# Patient Record
Sex: Female | Born: 1935 | Race: Black or African American | Hispanic: No | Smoking: Former smoker
Health system: Southern US, Community
[De-identification: ages and names within clinical notes are randomized; demographics above are authoritative.]

## PROBLEM LIST (undated history)

## (undated) DIAGNOSIS — D509 Iron deficiency anemia, unspecified: Secondary | ICD-10-CM

## (undated) DIAGNOSIS — E119 Type 2 diabetes mellitus without complications: Secondary | ICD-10-CM

## (undated) DIAGNOSIS — M8580 Other specified disorders of bone density and structure, unspecified site: Secondary | ICD-10-CM

## (undated) DIAGNOSIS — M1710 Unilateral primary osteoarthritis, unspecified knee: Secondary | ICD-10-CM

## (undated) DIAGNOSIS — K219 Gastro-esophageal reflux disease without esophagitis: Secondary | ICD-10-CM

## (undated) DIAGNOSIS — R590 Localized enlarged lymph nodes: Secondary | ICD-10-CM

## (undated) DIAGNOSIS — N189 Chronic kidney disease, unspecified: Secondary | ICD-10-CM

## (undated) DIAGNOSIS — Z860100 Personal history of colon polyps, unspecified: Secondary | ICD-10-CM

## (undated) DIAGNOSIS — I10 Essential (primary) hypertension: Secondary | ICD-10-CM

## (undated) DIAGNOSIS — Z87442 Personal history of urinary calculi: Secondary | ICD-10-CM

## (undated) DIAGNOSIS — I739 Peripheral vascular disease, unspecified: Secondary | ICD-10-CM

## (undated) HISTORY — DX: Personal history of colon polyps, unspecified: Z86.0100

## (undated) HISTORY — PX: WISDOM TOOTH EXTRACTION: SHX21

## (undated) HISTORY — DX: Type 2 diabetes mellitus without complications: E11.9

## (undated) HISTORY — PX: DENTAL SURGERY: SHX609

## (undated) HISTORY — DX: Other specified disorders of bone density and structure, unspecified site: M85.80

## (undated) HISTORY — DX: Unilateral primary osteoarthritis, unspecified knee: M17.10

## (undated) HISTORY — PX: TOTAL ABDOMINAL HYSTERECTOMY: SHX209

## (undated) HISTORY — DX: Chronic kidney disease, unspecified: N18.9

## (undated) HISTORY — DX: Iron deficiency anemia, unspecified: D50.9

## (undated) HISTORY — DX: Personal history of colonic polyps: Z86.010

## (undated) HISTORY — PX: OTHER SURGICAL HISTORY: SHX169

## (undated) HISTORY — DX: Peripheral vascular disease, unspecified: I73.9

## (undated) HISTORY — DX: Localized enlarged lymph nodes: R59.0

## (undated) HISTORY — DX: Essential (primary) hypertension: I10

---

## 2001-01-31 ENCOUNTER — Encounter: Payer: Self-pay | Admitting: Family Medicine

## 2001-01-31 ENCOUNTER — Ambulatory Visit (HOSPITAL_COMMUNITY): Admission: RE | Admit: 2001-01-31 | Discharge: 2001-01-31 | Payer: Self-pay | Admitting: Family Medicine

## 2001-05-17 ENCOUNTER — Encounter: Payer: Self-pay | Admitting: Internal Medicine

## 2001-05-17 ENCOUNTER — Ambulatory Visit (HOSPITAL_COMMUNITY): Admission: RE | Admit: 2001-05-17 | Discharge: 2001-05-17 | Payer: Self-pay | Admitting: Internal Medicine

## 2002-02-12 ENCOUNTER — Ambulatory Visit (HOSPITAL_COMMUNITY): Admission: RE | Admit: 2002-02-12 | Discharge: 2002-02-12 | Payer: Self-pay | Admitting: Family Medicine

## 2002-02-12 ENCOUNTER — Encounter: Payer: Self-pay | Admitting: Family Medicine

## 2002-10-19 ENCOUNTER — Encounter: Payer: Self-pay | Admitting: Podiatry

## 2002-10-22 ENCOUNTER — Ambulatory Visit (HOSPITAL_COMMUNITY): Admission: RE | Admit: 2002-10-22 | Discharge: 2002-10-22 | Payer: Self-pay | Admitting: Podiatry

## 2003-02-13 ENCOUNTER — Ambulatory Visit (HOSPITAL_COMMUNITY): Admission: RE | Admit: 2003-02-13 | Discharge: 2003-02-13 | Payer: Self-pay | Admitting: Family Medicine

## 2003-02-13 ENCOUNTER — Encounter: Payer: Self-pay | Admitting: Family Medicine

## 2003-07-01 ENCOUNTER — Ambulatory Visit (HOSPITAL_COMMUNITY): Admission: RE | Admit: 2003-07-01 | Discharge: 2003-07-01 | Payer: Self-pay | Admitting: Podiatry

## 2004-02-18 ENCOUNTER — Ambulatory Visit (HOSPITAL_COMMUNITY): Admission: RE | Admit: 2004-02-18 | Discharge: 2004-02-18 | Payer: Self-pay | Admitting: Family Medicine

## 2004-11-04 ENCOUNTER — Ambulatory Visit (HOSPITAL_COMMUNITY): Admission: RE | Admit: 2004-11-04 | Discharge: 2004-11-04 | Payer: Self-pay | Admitting: Family Medicine

## 2004-11-12 ENCOUNTER — Ambulatory Visit: Payer: Self-pay | Admitting: Orthopedic Surgery

## 2004-12-18 ENCOUNTER — Ambulatory Visit: Payer: Self-pay | Admitting: Gastroenterology

## 2005-06-16 ENCOUNTER — Ambulatory Visit: Payer: Self-pay | Admitting: Orthopedic Surgery

## 2005-06-22 ENCOUNTER — Ambulatory Visit (HOSPITAL_COMMUNITY): Admission: RE | Admit: 2005-06-22 | Discharge: 2005-06-22 | Payer: Self-pay | Admitting: Family Medicine

## 2005-06-29 ENCOUNTER — Ambulatory Visit (HOSPITAL_COMMUNITY): Admission: RE | Admit: 2005-06-29 | Discharge: 2005-06-29 | Payer: Self-pay | Admitting: Family Medicine

## 2005-07-27 ENCOUNTER — Ambulatory Visit (HOSPITAL_COMMUNITY): Admission: RE | Admit: 2005-07-27 | Discharge: 2005-07-27 | Payer: Self-pay | Admitting: Internal Medicine

## 2005-07-29 ENCOUNTER — Ambulatory Visit (HOSPITAL_COMMUNITY): Admission: RE | Admit: 2005-07-29 | Discharge: 2005-07-29 | Payer: Self-pay | Admitting: Internal Medicine

## 2006-06-23 ENCOUNTER — Ambulatory Visit (HOSPITAL_COMMUNITY): Admission: RE | Admit: 2006-06-23 | Discharge: 2006-06-23 | Payer: Self-pay | Admitting: Family Medicine

## 2006-07-04 ENCOUNTER — Ambulatory Visit: Payer: Self-pay | Admitting: Family Medicine

## 2006-08-15 ENCOUNTER — Ambulatory Visit: Payer: Self-pay | Admitting: Family Medicine

## 2006-09-15 ENCOUNTER — Ambulatory Visit: Payer: Self-pay | Admitting: Family Medicine

## 2006-10-14 ENCOUNTER — Encounter: Payer: Self-pay | Admitting: Family Medicine

## 2006-10-14 DIAGNOSIS — M899 Disorder of bone, unspecified: Secondary | ICD-10-CM

## 2006-10-14 DIAGNOSIS — E669 Obesity, unspecified: Secondary | ICD-10-CM

## 2006-10-14 DIAGNOSIS — I10 Essential (primary) hypertension: Secondary | ICD-10-CM

## 2006-10-14 DIAGNOSIS — Z860100 Personal history of colon polyps, unspecified: Secondary | ICD-10-CM | POA: Insufficient documentation

## 2006-10-14 DIAGNOSIS — M949 Disorder of cartilage, unspecified: Secondary | ICD-10-CM

## 2006-10-14 DIAGNOSIS — M1990 Unspecified osteoarthritis, unspecified site: Secondary | ICD-10-CM

## 2006-11-17 ENCOUNTER — Ambulatory Visit: Payer: Self-pay | Admitting: Family Medicine

## 2006-11-18 ENCOUNTER — Encounter (INDEPENDENT_AMBULATORY_CARE_PROVIDER_SITE_OTHER): Payer: Self-pay | Admitting: Family Medicine

## 2006-11-18 LAB — CONVERTED CEMR LAB
BUN: 30 mg/dL — ABNORMAL HIGH (ref 6–23)
CO2: 21 meq/L (ref 19–32)
Calcium: 9 mg/dL (ref 8.4–10.5)
Chloride: 107 meq/L (ref 96–112)
Creatinine, Ser: 1.53 mg/dL — ABNORMAL HIGH (ref 0.40–1.20)
Glucose, Bld: 101 mg/dL — ABNORMAL HIGH (ref 70–99)
Potassium: 4.2 meq/L (ref 3.5–5.3)
Sodium: 144 meq/L (ref 135–145)

## 2006-12-19 ENCOUNTER — Ambulatory Visit: Payer: Self-pay | Admitting: Gastroenterology

## 2006-12-19 ENCOUNTER — Ambulatory Visit (HOSPITAL_COMMUNITY): Admission: RE | Admit: 2006-12-19 | Discharge: 2006-12-19 | Payer: Self-pay | Admitting: Gastroenterology

## 2006-12-20 LAB — HM COLONOSCOPY

## 2006-12-22 ENCOUNTER — Ambulatory Visit: Payer: Self-pay | Admitting: Family Medicine

## 2006-12-23 ENCOUNTER — Encounter (INDEPENDENT_AMBULATORY_CARE_PROVIDER_SITE_OTHER): Payer: Self-pay | Admitting: Family Medicine

## 2007-01-02 ENCOUNTER — Ambulatory Visit (HOSPITAL_COMMUNITY): Admission: RE | Admit: 2007-01-02 | Discharge: 2007-01-02 | Payer: Self-pay | Admitting: Family Medicine

## 2007-01-02 ENCOUNTER — Encounter (INDEPENDENT_AMBULATORY_CARE_PROVIDER_SITE_OTHER): Payer: Self-pay | Admitting: Family Medicine

## 2007-01-24 ENCOUNTER — Ambulatory Visit: Payer: Self-pay | Admitting: Family Medicine

## 2007-04-25 ENCOUNTER — Ambulatory Visit: Payer: Self-pay | Admitting: Family Medicine

## 2007-04-25 LAB — CONVERTED CEMR LAB
Cholesterol, target level: 200 mg/dL
HDL goal, serum: 40 mg/dL
LDL Goal: 130 mg/dL

## 2007-04-26 ENCOUNTER — Encounter (INDEPENDENT_AMBULATORY_CARE_PROVIDER_SITE_OTHER): Payer: Self-pay | Admitting: Family Medicine

## 2007-04-27 ENCOUNTER — Encounter (INDEPENDENT_AMBULATORY_CARE_PROVIDER_SITE_OTHER): Payer: Self-pay | Admitting: Family Medicine

## 2007-04-28 ENCOUNTER — Telehealth (INDEPENDENT_AMBULATORY_CARE_PROVIDER_SITE_OTHER): Payer: Self-pay | Admitting: *Deleted

## 2007-04-28 LAB — CONVERTED CEMR LAB
Albumin: 3.8 g/dL (ref 3.5–5.2)
CO2: 28 meq/L (ref 19–32)
Calcium: 9.3 mg/dL (ref 8.4–10.5)
Cholesterol: 180 mg/dL (ref 0–200)
Sodium: 143 meq/L (ref 135–145)
Total CHOL/HDL Ratio: 2.6
Total Protein: 6.9 g/dL (ref 6.0–8.3)
Triglycerides: 57 mg/dL (ref <150)

## 2007-05-23 ENCOUNTER — Ambulatory Visit: Payer: Self-pay | Admitting: Family Medicine

## 2007-06-22 ENCOUNTER — Ambulatory Visit: Payer: Self-pay | Admitting: Family Medicine

## 2007-06-26 ENCOUNTER — Ambulatory Visit (HOSPITAL_COMMUNITY): Admission: RE | Admit: 2007-06-26 | Discharge: 2007-06-26 | Payer: Self-pay | Admitting: Family Medicine

## 2007-06-28 ENCOUNTER — Encounter (INDEPENDENT_AMBULATORY_CARE_PROVIDER_SITE_OTHER): Payer: Self-pay | Admitting: Family Medicine

## 2007-07-06 ENCOUNTER — Ambulatory Visit: Payer: Self-pay | Admitting: Orthopedic Surgery

## 2007-07-06 ENCOUNTER — Encounter (INDEPENDENT_AMBULATORY_CARE_PROVIDER_SITE_OTHER): Payer: Self-pay | Admitting: Family Medicine

## 2007-07-06 DIAGNOSIS — M1710 Unilateral primary osteoarthritis, unspecified knee: Secondary | ICD-10-CM

## 2007-07-20 ENCOUNTER — Ambulatory Visit: Payer: Self-pay | Admitting: Orthopedic Surgery

## 2007-08-07 ENCOUNTER — Ambulatory Visit: Payer: Self-pay | Admitting: Family Medicine

## 2007-08-15 ENCOUNTER — Encounter: Payer: Self-pay | Admitting: Orthopedic Surgery

## 2007-08-15 ENCOUNTER — Ambulatory Visit: Payer: Self-pay | Admitting: Orthopedic Surgery

## 2007-08-15 ENCOUNTER — Inpatient Hospital Stay (HOSPITAL_COMMUNITY): Admission: RE | Admit: 2007-08-15 | Discharge: 2007-08-18 | Payer: Self-pay | Admitting: Orthopedic Surgery

## 2007-08-18 ENCOUNTER — Encounter (INDEPENDENT_AMBULATORY_CARE_PROVIDER_SITE_OTHER): Payer: Self-pay | Admitting: Family Medicine

## 2007-08-18 ENCOUNTER — Encounter: Payer: Self-pay | Admitting: Orthopedic Surgery

## 2007-08-22 ENCOUNTER — Encounter (INDEPENDENT_AMBULATORY_CARE_PROVIDER_SITE_OTHER): Payer: Self-pay | Admitting: *Deleted

## 2007-08-22 LAB — CONVERTED CEMR LAB
BUN: 19 mg/dL
CO2: 27 meq/L
Calcium: 8.3 mg/dL
Chloride: 110 meq/L
INR: 1.8
Potassium: 4.1 meq/L
Prothrombin Time: 21.4 s

## 2007-08-29 ENCOUNTER — Ambulatory Visit: Payer: Self-pay | Admitting: Orthopedic Surgery

## 2007-09-06 ENCOUNTER — Ambulatory Visit: Payer: Self-pay | Admitting: Orthopedic Surgery

## 2007-09-23 ENCOUNTER — Emergency Department (HOSPITAL_COMMUNITY): Admission: EM | Admit: 2007-09-23 | Discharge: 2007-09-23 | Payer: Self-pay | Admitting: Emergency Medicine

## 2007-09-25 ENCOUNTER — Telehealth: Payer: Self-pay | Admitting: Orthopedic Surgery

## 2007-09-27 ENCOUNTER — Ambulatory Visit: Payer: Self-pay | Admitting: Orthopedic Surgery

## 2007-10-17 ENCOUNTER — Telehealth: Payer: Self-pay | Admitting: Orthopedic Surgery

## 2007-10-26 ENCOUNTER — Encounter (INDEPENDENT_AMBULATORY_CARE_PROVIDER_SITE_OTHER): Payer: Self-pay | Admitting: Family Medicine

## 2007-11-02 ENCOUNTER — Encounter (INDEPENDENT_AMBULATORY_CARE_PROVIDER_SITE_OTHER): Payer: Self-pay | Admitting: Family Medicine

## 2007-11-06 ENCOUNTER — Ambulatory Visit: Payer: Self-pay | Admitting: Family Medicine

## 2007-11-07 ENCOUNTER — Telehealth (INDEPENDENT_AMBULATORY_CARE_PROVIDER_SITE_OTHER): Payer: Self-pay | Admitting: *Deleted

## 2007-11-07 LAB — CONVERTED CEMR LAB
BUN: 15 mg/dL (ref 6–23)
CO2: 23 meq/L (ref 19–32)
Creatinine, Ser: 1.07 mg/dL (ref 0.40–1.20)
Hemoglobin: 12.1 g/dL (ref 12.0–15.0)
Lymphocytes Relative: 39 % (ref 12–46)
Lymphs Abs: 1.8 10*3/uL (ref 0.7–4.0)
MCHC: 31.7 g/dL (ref 30.0–36.0)
MCV: 93.9 fL (ref 78.0–100.0)
Neutrophils Relative %: 49 % (ref 43–77)
Platelets: 254 10*3/uL (ref 150–400)
Potassium: 4.1 meq/L (ref 3.5–5.3)
RDW: 14.7 % (ref 11.5–15.5)
WBC: 4.5 10*3/uL (ref 4.0–10.5)

## 2007-11-09 ENCOUNTER — Ambulatory Visit: Payer: Self-pay | Admitting: Orthopedic Surgery

## 2007-11-17 ENCOUNTER — Ambulatory Visit: Payer: Self-pay | Admitting: Family Medicine

## 2007-11-30 ENCOUNTER — Encounter (INDEPENDENT_AMBULATORY_CARE_PROVIDER_SITE_OTHER): Payer: Self-pay | Admitting: Family Medicine

## 2007-12-04 ENCOUNTER — Ambulatory Visit: Payer: Self-pay | Admitting: Family Medicine

## 2007-12-04 DIAGNOSIS — E119 Type 2 diabetes mellitus without complications: Secondary | ICD-10-CM | POA: Insufficient documentation

## 2007-12-07 ENCOUNTER — Encounter (INDEPENDENT_AMBULATORY_CARE_PROVIDER_SITE_OTHER): Payer: Self-pay | Admitting: Family Medicine

## 2007-12-08 ENCOUNTER — Encounter (INDEPENDENT_AMBULATORY_CARE_PROVIDER_SITE_OTHER): Payer: Self-pay | Admitting: Family Medicine

## 2007-12-08 ENCOUNTER — Telehealth (INDEPENDENT_AMBULATORY_CARE_PROVIDER_SITE_OTHER): Payer: Self-pay | Admitting: *Deleted

## 2007-12-12 ENCOUNTER — Encounter (INDEPENDENT_AMBULATORY_CARE_PROVIDER_SITE_OTHER): Payer: Self-pay | Admitting: Family Medicine

## 2007-12-22 ENCOUNTER — Ambulatory Visit: Payer: Self-pay | Admitting: Family Medicine

## 2007-12-22 LAB — CONVERTED CEMR LAB
Blood Glucose, Fasting: 129 mg/dL
Hgb A1c MFr Bld: 6.7 %
LDL Goal: 100 mg/dL

## 2007-12-26 ENCOUNTER — Ambulatory Visit: Payer: Self-pay | Admitting: Orthopedic Surgery

## 2007-12-28 ENCOUNTER — Encounter: Payer: Self-pay | Admitting: Orthopedic Surgery

## 2007-12-28 ENCOUNTER — Encounter (HOSPITAL_COMMUNITY): Admission: RE | Admit: 2007-12-28 | Discharge: 2008-01-27 | Payer: Self-pay | Admitting: Orthopedic Surgery

## 2008-01-25 ENCOUNTER — Encounter (INDEPENDENT_AMBULATORY_CARE_PROVIDER_SITE_OTHER): Payer: Self-pay | Admitting: Family Medicine

## 2008-01-25 ENCOUNTER — Telehealth (INDEPENDENT_AMBULATORY_CARE_PROVIDER_SITE_OTHER): Payer: Self-pay | Admitting: *Deleted

## 2008-01-25 LAB — CONVERTED CEMR LAB
AST: 28 units/L (ref 0–37)
Albumin: 4 g/dL (ref 3.5–5.2)
Alkaline Phosphatase: 109 units/L (ref 39–117)
Calcium: 9.4 mg/dL (ref 8.4–10.5)
Cholesterol: 165 mg/dL (ref 0–200)
Creatinine, Ser: 1.24 mg/dL — ABNORMAL HIGH (ref 0.40–1.20)
HDL: 63 mg/dL (ref >39)
Microalb, Ur: 0.52 mg/dL (ref 0.00–1.89)
Potassium: 4.2 meq/L (ref 3.5–5.3)
Sodium: 143 meq/L (ref 135–145)
TSH: 1.035 microintl units/mL (ref 0.350–5.50)
Total Bilirubin: 0.6 mg/dL (ref 0.3–1.2)
Total CHOL/HDL Ratio: 2.6
Total Protein: 7.1 g/dL (ref 6.0–8.3)

## 2008-01-29 ENCOUNTER — Encounter (HOSPITAL_COMMUNITY): Admission: RE | Admit: 2008-01-29 | Discharge: 2008-02-28 | Payer: Self-pay | Admitting: Orthopedic Surgery

## 2008-02-01 ENCOUNTER — Encounter: Payer: Self-pay | Admitting: Orthopedic Surgery

## 2008-02-05 ENCOUNTER — Ambulatory Visit: Payer: Self-pay | Admitting: Family Medicine

## 2008-02-07 ENCOUNTER — Encounter: Payer: Self-pay | Admitting: Orthopedic Surgery

## 2008-02-08 ENCOUNTER — Emergency Department (HOSPITAL_COMMUNITY): Admission: EM | Admit: 2008-02-08 | Discharge: 2008-02-08 | Payer: Self-pay | Admitting: Emergency Medicine

## 2008-02-09 ENCOUNTER — Emergency Department (HOSPITAL_COMMUNITY): Admission: EM | Admit: 2008-02-09 | Discharge: 2008-02-10 | Payer: Self-pay | Admitting: Emergency Medicine

## 2008-02-09 ENCOUNTER — Telehealth (INDEPENDENT_AMBULATORY_CARE_PROVIDER_SITE_OTHER): Payer: Self-pay | Admitting: Family Medicine

## 2008-02-09 ENCOUNTER — Telehealth: Payer: Self-pay | Admitting: Orthopedic Surgery

## 2008-02-13 ENCOUNTER — Ambulatory Visit: Payer: Self-pay | Admitting: Family Medicine

## 2008-02-22 ENCOUNTER — Encounter (INDEPENDENT_AMBULATORY_CARE_PROVIDER_SITE_OTHER): Payer: Self-pay | Admitting: Family Medicine

## 2008-02-29 ENCOUNTER — Ambulatory Visit: Payer: Self-pay | Admitting: Orthopedic Surgery

## 2008-02-29 DIAGNOSIS — M24569 Contracture, unspecified knee: Secondary | ICD-10-CM

## 2008-03-08 ENCOUNTER — Ambulatory Visit: Payer: Self-pay | Admitting: Orthopedic Surgery

## 2008-03-08 ENCOUNTER — Ambulatory Visit (HOSPITAL_COMMUNITY): Admission: RE | Admit: 2008-03-08 | Discharge: 2008-03-08 | Payer: Self-pay | Admitting: Orthopedic Surgery

## 2008-03-08 HISTORY — PX: OTHER SURGICAL HISTORY: SHX169

## 2008-03-12 ENCOUNTER — Ambulatory Visit: Payer: Self-pay | Admitting: Orthopedic Surgery

## 2008-03-21 ENCOUNTER — Ambulatory Visit: Payer: Self-pay | Admitting: Orthopedic Surgery

## 2008-03-21 ENCOUNTER — Telehealth: Payer: Self-pay | Admitting: Orthopedic Surgery

## 2008-03-26 ENCOUNTER — Ambulatory Visit: Payer: Self-pay | Admitting: Orthopedic Surgery

## 2008-03-29 ENCOUNTER — Encounter: Payer: Self-pay | Admitting: Orthopedic Surgery

## 2008-03-29 ENCOUNTER — Telehealth: Payer: Self-pay | Admitting: Orthopedic Surgery

## 2008-04-01 ENCOUNTER — Ambulatory Visit: Payer: Self-pay | Admitting: Orthopedic Surgery

## 2008-04-08 ENCOUNTER — Ambulatory Visit: Payer: Self-pay | Admitting: Family Medicine

## 2008-04-08 LAB — CONVERTED CEMR LAB
Glucose, Bld: 115 mg/dL
Hgb A1c MFr Bld: 6.6 %

## 2008-04-16 ENCOUNTER — Ambulatory Visit: Payer: Self-pay | Admitting: Orthopedic Surgery

## 2008-05-09 ENCOUNTER — Ambulatory Visit: Payer: Self-pay | Admitting: Orthopedic Surgery

## 2008-05-29 ENCOUNTER — Telehealth: Payer: Self-pay | Admitting: Orthopedic Surgery

## 2008-06-06 ENCOUNTER — Encounter: Payer: Self-pay | Admitting: Orthopedic Surgery

## 2008-06-13 ENCOUNTER — Ambulatory Visit: Payer: Self-pay | Admitting: Orthopedic Surgery

## 2008-06-26 ENCOUNTER — Ambulatory Visit (HOSPITAL_COMMUNITY): Admission: RE | Admit: 2008-06-26 | Discharge: 2008-06-26 | Payer: Self-pay | Admitting: Family Medicine

## 2008-07-08 ENCOUNTER — Ambulatory Visit: Payer: Self-pay | Admitting: Family Medicine

## 2008-07-09 ENCOUNTER — Encounter (INDEPENDENT_AMBULATORY_CARE_PROVIDER_SITE_OTHER): Payer: Self-pay | Admitting: Family Medicine

## 2008-07-10 ENCOUNTER — Encounter (INDEPENDENT_AMBULATORY_CARE_PROVIDER_SITE_OTHER): Payer: Self-pay | Admitting: Family Medicine

## 2008-08-14 HISTORY — PX: OTHER SURGICAL HISTORY: SHX169

## 2008-08-15 ENCOUNTER — Ambulatory Visit: Payer: Self-pay | Admitting: Orthopedic Surgery

## 2008-08-22 ENCOUNTER — Ambulatory Visit: Payer: Self-pay | Admitting: Family Medicine

## 2008-08-22 LAB — HM DIABETES EYE EXAM

## 2008-08-22 LAB — CONVERTED CEMR LAB: Glucose, Bld: 180 mg/dL

## 2008-08-24 ENCOUNTER — Encounter (INDEPENDENT_AMBULATORY_CARE_PROVIDER_SITE_OTHER): Payer: Self-pay | Admitting: Family Medicine

## 2008-08-26 ENCOUNTER — Encounter (INDEPENDENT_AMBULATORY_CARE_PROVIDER_SITE_OTHER): Payer: Self-pay | Admitting: Family Medicine

## 2008-08-26 ENCOUNTER — Ambulatory Visit (HOSPITAL_COMMUNITY): Admission: RE | Admit: 2008-08-26 | Discharge: 2008-08-26 | Payer: Self-pay | Admitting: Family Medicine

## 2008-08-26 LAB — CONVERTED CEMR LAB
CO2: 24 meq/L (ref 19–32)
Chloride: 106 meq/L (ref 96–112)
Creatinine, Ser: 1.31 mg/dL — ABNORMAL HIGH (ref 0.40–1.20)
Potassium: 4.3 meq/L (ref 3.5–5.3)

## 2008-08-27 ENCOUNTER — Encounter (INDEPENDENT_AMBULATORY_CARE_PROVIDER_SITE_OTHER): Payer: Self-pay | Admitting: Family Medicine

## 2008-10-18 HISTORY — PX: OTHER SURGICAL HISTORY: SHX169

## 2008-12-12 ENCOUNTER — Ambulatory Visit: Payer: Self-pay | Admitting: Family Medicine

## 2009-02-12 ENCOUNTER — Ambulatory Visit: Payer: Self-pay | Admitting: Orthopedic Surgery

## 2009-02-12 ENCOUNTER — Ambulatory Visit: Payer: Self-pay | Admitting: Family Medicine

## 2009-02-12 DIAGNOSIS — Z96659 Presence of unspecified artificial knee joint: Secondary | ICD-10-CM | POA: Insufficient documentation

## 2009-02-13 ENCOUNTER — Encounter (INDEPENDENT_AMBULATORY_CARE_PROVIDER_SITE_OTHER): Payer: Self-pay | Admitting: Family Medicine

## 2009-02-18 LAB — CONVERTED CEMR LAB
ALT: 26 units/L (ref 0–35)
Albumin: 3.8 g/dL (ref 3.5–5.2)
Alkaline Phosphatase: 112 units/L (ref 39–117)
BUN: 25 mg/dL — ABNORMAL HIGH (ref 6–23)
Basophils Absolute: 0 10*3/uL (ref 0.0–0.1)
Basophils Relative: 1 % (ref 0–1)
CO2: 28 meq/L (ref 19–32)
Cholesterol: 156 mg/dL (ref 0–200)
Creatinine, Urine: 168.3 mg/dL
Eosinophils Absolute: 0.2 10*3/uL (ref 0.0–0.7)
Eosinophils Relative: 3 % (ref 0–5)
HCT: 41.5 % (ref 36.0–46.0)
Lymphocytes Relative: 37 % (ref 12–46)
MCHC: 32.3 g/dL (ref 30.0–36.0)
Microalb, Ur: 1.02 mg/dL (ref 0.00–1.89)
Monocytes Absolute: 0.5 10*3/uL (ref 0.1–1.0)
Neutro Abs: 2.6 10*3/uL (ref 1.7–7.7)
Potassium: 4.5 meq/L (ref 3.5–5.3)
RBC: 4.26 M/uL (ref 3.87–5.11)
RDW: 14.4 % (ref 11.5–15.5)
Sodium: 146 meq/L — ABNORMAL HIGH (ref 135–145)
Total Protein: 7 g/dL (ref 6.0–8.3)
Triglycerides: 59 mg/dL (ref <150)
WBC: 5.3 10*3/uL (ref 4.0–10.5)

## 2009-04-16 ENCOUNTER — Ambulatory Visit: Payer: Self-pay | Admitting: Family Medicine

## 2009-04-16 DIAGNOSIS — N182 Chronic kidney disease, stage 2 (mild): Secondary | ICD-10-CM | POA: Insufficient documentation

## 2009-04-16 LAB — CONVERTED CEMR LAB: Glucose, Bld: 149 mg/dL

## 2009-06-27 ENCOUNTER — Ambulatory Visit (HOSPITAL_COMMUNITY): Admission: RE | Admit: 2009-06-27 | Discharge: 2009-06-27 | Payer: Self-pay | Admitting: Family Medicine

## 2009-07-02 ENCOUNTER — Ambulatory Visit: Payer: Self-pay | Admitting: Family Medicine

## 2009-07-02 LAB — CONVERTED CEMR LAB: Hgb A1c MFr Bld: 6.8 %

## 2009-07-14 ENCOUNTER — Encounter (INDEPENDENT_AMBULATORY_CARE_PROVIDER_SITE_OTHER): Payer: Self-pay | Admitting: Family Medicine

## 2009-08-25 ENCOUNTER — Ambulatory Visit: Payer: Self-pay | Admitting: Orthopedic Surgery

## 2009-09-03 ENCOUNTER — Ambulatory Visit: Payer: Self-pay | Admitting: Family Medicine

## 2009-09-19 ENCOUNTER — Encounter (INDEPENDENT_AMBULATORY_CARE_PROVIDER_SITE_OTHER): Payer: Self-pay | Admitting: *Deleted

## 2009-09-19 ENCOUNTER — Ambulatory Visit: Payer: Self-pay | Admitting: Cardiology

## 2009-09-19 DIAGNOSIS — R9431 Abnormal electrocardiogram [ECG] [EKG]: Secondary | ICD-10-CM

## 2009-09-19 DIAGNOSIS — R011 Cardiac murmur, unspecified: Secondary | ICD-10-CM | POA: Insufficient documentation

## 2009-09-23 ENCOUNTER — Encounter: Payer: Self-pay | Admitting: Cardiology

## 2009-09-23 ENCOUNTER — Ambulatory Visit (HOSPITAL_COMMUNITY): Admission: RE | Admit: 2009-09-23 | Discharge: 2009-09-23 | Payer: Self-pay | Admitting: Cardiology

## 2009-09-23 ENCOUNTER — Ambulatory Visit: Payer: Self-pay | Admitting: Cardiology

## 2009-09-29 ENCOUNTER — Encounter: Payer: Self-pay | Admitting: Family Medicine

## 2009-10-20 ENCOUNTER — Encounter: Payer: Self-pay | Admitting: Family Medicine

## 2009-10-23 ENCOUNTER — Ambulatory Visit: Payer: Self-pay | Admitting: Family Medicine

## 2009-11-04 ENCOUNTER — Encounter: Payer: Self-pay | Admitting: Family Medicine

## 2009-11-06 ENCOUNTER — Ambulatory Visit: Payer: Self-pay | Admitting: Family Medicine

## 2010-02-23 LAB — CONVERTED CEMR LAB
Basophils Absolute: 0 10*3/uL (ref 0.0–0.1)
Calcium: 9.3 mg/dL (ref 8.4–10.5)
Cholesterol: 156 mg/dL (ref 0–200)
Creatinine, Ser: 1.38 mg/dL — ABNORMAL HIGH (ref 0.40–1.20)
Creatinine, Urine: 172.6 mg/dL
Eosinophils Relative: 2 % (ref 0–5)
Glucose, Bld: 89 mg/dL (ref 70–99)
HDL: 64 mg/dL (ref >39)
Hemoglobin: 12.5 g/dL (ref 12.0–15.0)
LDL Cholesterol: 75 mg/dL (ref 0–99)
Lymphocytes Relative: 42 % (ref 12–46)
MCHC: 32 g/dL (ref 30.0–36.0)
MCV: 96.5 fL (ref 78.0–100.0)
Microalb Creat Ratio: 3.5 mg/g (ref 0.0–30.0)
Monocytes Absolute: 0.4 10*3/uL (ref 0.1–1.0)
Monocytes Relative: 9 % (ref 3–12)
Neutro Abs: 1.8 10*3/uL (ref 1.7–7.7)
Neutrophils Relative %: 46 % (ref 43–77)
Potassium: 4.6 meq/L (ref 3.5–5.3)
RBC: 4.05 M/uL (ref 3.87–5.11)
VLDL: 17 mg/dL (ref 0–40)
WBC: 3.9 10*3/uL — ABNORMAL LOW (ref 4.0–10.5)

## 2010-03-03 ENCOUNTER — Ambulatory Visit: Payer: Self-pay | Admitting: Family Medicine

## 2010-03-03 ENCOUNTER — Other Ambulatory Visit: Admission: RE | Admit: 2010-03-03 | Discharge: 2010-03-03 | Payer: Self-pay | Admitting: Family Medicine

## 2010-03-03 DIAGNOSIS — R5381 Other malaise: Secondary | ICD-10-CM | POA: Insufficient documentation

## 2010-03-03 DIAGNOSIS — R5383 Other fatigue: Secondary | ICD-10-CM

## 2010-03-03 DIAGNOSIS — I739 Peripheral vascular disease, unspecified: Secondary | ICD-10-CM | POA: Insufficient documentation

## 2010-03-04 LAB — CONVERTED CEMR LAB: Hgb A1c MFr Bld: 6.5 % — ABNORMAL HIGH (ref <5.7)

## 2010-03-06 ENCOUNTER — Ambulatory Visit (HOSPITAL_COMMUNITY): Admission: RE | Admit: 2010-03-06 | Discharge: 2010-03-06 | Payer: Self-pay | Admitting: Family Medicine

## 2010-04-14 ENCOUNTER — Ambulatory Visit: Payer: Self-pay | Admitting: Family Medicine

## 2010-04-22 ENCOUNTER — Ambulatory Visit: Payer: Self-pay | Admitting: Cardiovascular Disease

## 2010-04-22 ENCOUNTER — Encounter: Payer: Self-pay | Admitting: Adult Health

## 2010-05-19 ENCOUNTER — Ambulatory Visit: Payer: Self-pay | Admitting: Cardiovascular Disease

## 2010-06-15 ENCOUNTER — Ambulatory Visit: Payer: Self-pay | Admitting: Cardiovascular Disease

## 2010-06-15 ENCOUNTER — Ambulatory Visit: Payer: Self-pay

## 2010-06-30 ENCOUNTER — Ambulatory Visit (HOSPITAL_COMMUNITY): Admission: RE | Admit: 2010-06-30 | Discharge: 2010-06-30 | Payer: Self-pay | Admitting: Family Medicine

## 2010-07-01 LAB — CONVERTED CEMR LAB
BUN: 28 mg/dL — ABNORMAL HIGH (ref 6–23)
Chloride: 107 meq/L (ref 96–112)
Glucose, Bld: 94 mg/dL (ref 70–99)
Hgb A1c MFr Bld: 6.1 % — ABNORMAL HIGH (ref <5.7)
Potassium: 4.3 meq/L (ref 3.5–5.3)
Sodium: 142 meq/L (ref 135–145)

## 2010-07-02 ENCOUNTER — Ambulatory Visit: Payer: Self-pay | Admitting: Family Medicine

## 2010-09-25 LAB — CONVERTED CEMR LAB
CO2: 28 meq/L (ref 19–32)
Chloride: 105 meq/L (ref 96–112)
Glucose, Bld: 176 mg/dL — ABNORMAL HIGH (ref 70–99)
Sodium: 142 meq/L (ref 135–145)

## 2010-10-01 ENCOUNTER — Ambulatory Visit: Payer: Self-pay | Admitting: Family Medicine

## 2010-11-10 ENCOUNTER — Ambulatory Visit
Admission: RE | Admit: 2010-11-10 | Discharge: 2010-11-10 | Payer: Self-pay | Source: Home / Self Care | Attending: Family Medicine | Admitting: Family Medicine

## 2010-11-10 DIAGNOSIS — H6120 Impacted cerumen, unspecified ear: Secondary | ICD-10-CM | POA: Insufficient documentation

## 2010-11-15 LAB — CONVERTED CEMR LAB: Glucose, Bld: 123 mg/dL

## 2010-11-17 NOTE — Assessment & Plan Note (Signed)
Summary: FUP   Vital Signs:  Patient profile:   77 year old female Menstrual status:  hysterectomy Height:      65 inches Weight:      185 pounds BMI:     30.90 O2 Sat:      97 % Pulse rate:   59 / minute Pulse rhythm:   regular Resp:     16 per minute BP sitting:   110 / 64  (left arm) Cuff size:   large  Vitals Entered By: Everitt Amber LPN (April 14, 2010 10:37 AM)  Nutrition Counseling: Patient's BMI is greater than 25 and therefore counseled on weight management options. CC: Follow up chronic problems   Primary Care Provider:  Syliva Overman MD  CC:  Follow up chronic problems.  History of Present Illness: Reports  thatshe has been  doing well. Denies recent fever or chills. Denies sinus pressure, nasal congestion , ear pain or sore throat. Denies chest congestion, or cough productive of sputum. Denies chest pain, palpitations, PND, orthopnea or leg swelling. Denies abdominal pain, nausea, vomitting, diarrhea or constipation. Denies change in bowel movements or bloody stool. Denies dysuria , frequency, incontinence or hesitancy. chronic   joint pain, and reduced mobility. Denies headaches, vertigo, seizures. Denies depression, anxiety or insomnia. Denies  rash, lesions, or itch.     Current Medications (verified): 1)  Caltrate 600+d Plus 600-400 Mg-Unit  Tabs (Calcium Carbonate-Vit D-Min) .... Two Times A Day 2)  Aspirin 81 Mg Tbec (Aspirin) .... Once Daily 3)  Lisinopril-Hydrochlorothiazide 20-12.5 Mg Tabs (Lisinopril-Hydrochlorothiazide) .... Take 1 Tablet By Mouth Once A Day 4)  Vitamin C Cr 500 Mg Cr-Caps (Ascorbic Acid) .... One Daily 5)  Flaxseed  Misc (Flaxseed) .... Two Tablespoons Daily 6)  Metformin Hcl 500 Mg Tabs (Metformin Hcl) .... Take 1 Tablet By Mouth Once A Day 7)  Accu-Chek Aviva  Strp (Glucose Blood) .... Once Daily Testing 8)  Accu-Chek Multiclix Lancets  Misc (Lancets) .... Once Daily Testing  Allergies (verified): No Known Drug  Allergies  Review of Systems      See HPI General:  Complains of fatigue. Eyes:  Denies discharge, double vision, eye pain, and red eye. MS:  Complains of low back pain, muscle aches, and stiffness; ambulates with a caner, no recent falls. Endo:  Denies cold intolerance, excessive hunger, excessive thirst, excessive urination, heat intolerance, polyuria, and weight change. Heme:  Denies abnormal bruising and bleeding. Allergy:  Complains of seasonal allergies; denies hives or rash and itching eyes.  Physical Exam  General:  Well-developed,well-nourished,in no acute distress; alert,appropriate and cooperative throughout examination HEENT: No facial asymmetry,  EOMI, No sinus tenderness, TM's Clear, oropharynx  pink and moist.   Chest: Clear to auscultation bilaterally.  CVS: S1, S2, No murmurs, No S3.   Abd: Soft, Nontender.  MS: decreased  ROM spine, hips, shoulders and knees.  Ext: No edema.   CNS: CN 2-12 intact, power tone and sensation normal throughout.   Skin: Intact, no visible lesions or rashes.  Psych: Good eye contact, normal affect.  Memory intact, not anxious or depressed appearing.    Impression & Recommendations:  Problem # 1:  PERIPHERAL VASCULAR DISEASE (ICD-443.9) Assessment Comment Only  Orders: Cardiology Referral (Cardiology), recent AbI testing revealed arterial disease in lower ext , curbside consult with cRDIOLOGY DETERMINED THE NEED FOR FURTHER IMAging studies  Problem # 2:  HYPERTENSION (ICD-401.9) Assessment: Improved  Her updated medication list for this problem includes:    Lisinopril-hydrochlorothiazide 20-12.5 Mg  Tabs (Lisinopril-hydrochlorothiazide) .Marland Kitchen... Take 1 tablet by mouth once a day  BP today: 110/64 Prior BP: 90/70 (03/03/2010)  Prior 10 Yr Risk Heart Disease: 6 % (07/02/2009)  Labs Reviewed: K+: 4.6 (02/20/2010) Creat: : 1.38 (02/20/2010)   Chol: 156 (02/20/2010)   HDL: 64 (02/20/2010)   LDL: 75 (02/20/2010)   TG: 83  (02/20/2010)  Problem # 3:  OSTEOARTHRITIS (ICD-715.90) Assessment: Unchanged  Her updated medication list for this problem includes:    Aspirin 81 Mg Tbec (Aspirin) ..... Once daily  Problem # 4:  DIABETES MELLITUS, TYPE II, CONTROLLED (ICD-250.00) Assessment: Unchanged  Her updated medication list for this problem includes:    Aspirin 81 Mg Tbec (Aspirin) ..... Once daily    Lisinopril-hydrochlorothiazide 20-12.5 Mg Tabs (Lisinopril-hydrochlorothiazide) .Marland Kitchen... Take 1 tablet by mouth once a day    Metformin Hcl 500 Mg Tabs (Metformin hcl) .Marland Kitchen... Take 1 tablet by mouth once a day  Orders: T- Hemoglobin A1C ([id number])  Labs Reviewed: Creat: 1.38 (02/20/2010)     Last Eye Exam: All well and advised recheck one year (08/22/2008) Reviewed HgBA1c results: 6.5 (03/03/2010)  6.5 (10/30/2009)  Complete Medication List: 1)  Caltrate 600+d Plus 600-400 Mg-unit Tabs (Calcium carbonate-vit d-min) .... Two times a day 2)  Aspirin 81 Mg Tbec (Aspirin) .... Once daily 3)  Lisinopril-hydrochlorothiazide 20-12.5 Mg Tabs (Lisinopril-hydrochlorothiazide) .... Take 1 tablet by mouth once a day 4)  Vitamin C Cr 500 Mg Cr-caps (Ascorbic acid) .... One daily 5)  Flaxseed Misc (Flaxseed) .... Two tablespoons daily 6)  Metformin Hcl 500 Mg Tabs (Metformin hcl) .... Take 1 tablet by mouth once a day 7)  Accu-chek Aviva Strp (Glucose blood) .... Once daily testing 8)  Accu-chek Multiclix Lancets Misc (Lancets) .... Once daily testing  Other Orders: T-Basic Metabolic Panel [phone])  Patient Instructions: 1)  Please schedule a follow-up appointment in 4 months. 2)  It is important that you exercise regularly at least 20 minutes 5 times a week. If you develop chest pain, have severe difficulty breathing, or feel very tired , stop exercising immediately and seek medical attention. 3)  You need to lose weight. Consider a lower calorie diet and regular exercise.  4)  BMP prior to visit, ICD-9: 5)   HbgA1C prior to visit, ICD-9:  non fasting in mid august, rember to drink at least 48 to 56 ounces water daily 6)  I will contact the card about your leg study, in the meantime keep walking and take 1 baby asprin daily, this will improve your circulation 7)  BP is great take med as before

## 2010-11-17 NOTE — Assessment & Plan Note (Signed)
Summary: per dr lab   Vital Signs:  Patient profile:   77 year old female Menstrual status:  hysterectomy Height:      65 inches Weight:      184.25 pounds BMI:     30.77 O2 Sat:      97 % Pulse rate:   55 / minute Pulse rhythm:   regular Resp:     16 per minute BP sitting:   110 / 60  (left arm) Cuff size:   large  Vitals Entered By: Everitt Amber LPN (July 02, 2010 10:40 AM)  Nutrition Counseling: Patient's BMI is greater than 25 and therefore counseled on weight management options. CC: Follow up chronic problems   Primary Care Provider:  Syliva Overman MD  CC:  Follow up chronic problems.  History of Present Illness: Reports  thatshe has been doing very well. She contiues to follow ahealthy diet, adntests her blood sugars regularly and gets excellent results. Denies recent fever or chills. Denies sinus pressure, nasal congestion , ear pain or sore throat. Denies chest congestion, or cough productive of sputum. Denies chest pain, palpitations, PND, orthopnea or leg swelling. Denies abdominal pain, nausea, vomitting, diarrhea or constipation. Denies change in bowel movements or bloody stool. Denies dysuria , frequency, incontinence or hesitancy. c/o increased left knee pain and reduced mobility, which she feels she got when she was having her vascular study done.She plans to call her ortho doc fo re-evaluation. Denies headaches, vertigo, seizures. Denies depression, anxiety or insomnia. Denies  rash, lesions, or itch.     Current Medications (verified): 1)  Caltrate 600+d Plus 600-400 Mg-Unit  Tabs (Calcium Carbonate-Vit D-Min) .... Two Times A Day 2)  Aspirin 81 Mg Tbec (Aspirin) .... Once Daily 3)  Lisinopril-Hydrochlorothiazide 20-12.5 Mg Tabs (Lisinopril-Hydrochlorothiazide) .... Take 1 Tablet By Mouth Once A Day 4)  Vitamin C Cr 500 Mg Cr-Caps (Ascorbic Acid) .... One Daily 5)  Flaxseed  Misc (Flaxseed) .... Two Tablespoons Daily 6)  Metformin Hcl 500 Mg  Tabs (Metformin Hcl) .... Take 1 Tablet By Mouth Once A Day 7)  Accu-Chek Aviva  Strp (Glucose Blood) .... Once Daily Testing 8)  Accu-Chek Multiclix Lancets  Misc (Lancets) .... Once Daily Testing  Allergies (verified): No Known Drug Allergies  Review of Systems      See HPI MS:  Complains of joint pain and stiffness; left knee   stiffness worse  since she had lower extremity vascula testing, has upcomiong ortho appt in Oct. Endo:  Denies excessive thirst and excessive urination. Heme:  Denies abnormal bruising and bleeding. Allergy:  Denies hives or rash and persistent infections.  Physical Exam  General:  Well-developed,well-nourished,in no acute distress; alert,appropriate and cooperative throughout examination HEENT: No facial asymmetry,  EOMI, No sinus tenderness, TM's Clear, oropharynx  pink and moist.   Chest: Clear to auscultation bilaterally.  CVS: S1, S2, No murmurs, No S3.   Abd: Soft, Nontender.  [id number]  ROM spine, hips, shoulders and knees.  Ext: No edema.   CNS: CN 2-12 intact, power tone and sensation normal throughout.   Skin: Intact, no visible lesions or rashes.  Psych: Good eye contact, normal affect.  Memory intact, not anxious or depressed appearing.    Impression & Recommendations:  Problem # 1:  DIABETES MELLITUS, TYPE II, CONTROLLED (ICD-250.00) Assessment Improved  Her updated medication list for this problem includes:    Aspirin 81 Mg Tbec (Aspirin) ..... Once daily    Lisinopril-hydrochlorothiazide 20-12.5 Mg Tabs (Lisinopril-hydrochlorothiazide) .Marland Kitchen... Take 1  tablet by mouth once a day    Metformin Hcl 500 Mg Tabs (Metformin hcl) .Marland Kitchen... Take 1 tablet by mouth once a day  Orders: T- Hemoglobin A1C ([id number])  Labs Reviewed: Creat: 1.25 (06/30/2010)     Last Eye Exam: All well and advised recheck one year (08/22/2008) Reviewed HgBA1c results: 6.1 (06/30/2010)  6.5 (03/03/2010)  Problem # 2:  HYPERTENSION (ICD-401.9) Assessment:  Improved  Her updated medication list for this problem includes:    Lisinopril-hydrochlorothiazide 20-12.5 Mg Tabs (Lisinopril-hydrochlorothiazide) .Marland Kitchen... Take 1 tablet by mouth once a day  Orders: T-Basic Metabolic Panel [id number])  BP today: 110/60 Prior BP: 128/80 (06/15/2010)  Prior 10 Yr Risk Heart Disease: 6 % (07/02/2009)  Labs Reviewed: K+: 4.3 (06/30/2010) Creat: : 1.25 (06/30/2010)   Chol: 156 (02/20/2010)   HDL: 64 (02/20/2010)   LDL: 75 (02/20/2010)   TG: 83 (02/20/2010)  Problem # 3:  DEGENERATIVE JOINT DISEASE, BOTH KNEES, SEVERE (ICD-715.96) Assessment: Deteriorated  Her updated medication list for this problem includes:    Aspirin 81 Mg Tbec (Aspirin) ..... Once daily pt will make appt with ortho  Complete Medication List: 1)  Caltrate 600+d Plus 600-400 Mg-unit Tabs (Calcium carbonate-vit d-min) .... Two times a day 2)  Aspirin 81 Mg Tbec (Aspirin) .... Once daily 3)  Lisinopril-hydrochlorothiazide 20-12.5 Mg Tabs (Lisinopril-hydrochlorothiazide) .... Take 1 tablet by mouth once a day 4)  Vitamin C Cr 500 Mg Cr-caps (Ascorbic acid) .... One daily 5)  Flaxseed Misc (Flaxseed) .... Two tablespoons daily 6)  Metformin Hcl 500 Mg Tabs (Metformin hcl) .... Take 1 tablet by mouth once a day 7)  Accu-chek Aviva Strp (Glucose blood) .... Once daily testing 8)  Accu-chek Multiclix Lancets Misc (Lancets) .... Once daily testing  Other Orders: Influenza Vaccine MCR ([id number])  Patient Instructions: 1)  Please schedule a follow-up appointment in 3 months. 2)  It is important that you exercise regularly at least 20 minutes 5 times a week. If you develop chest pain, have severe difficulty breathing, or feel very tired , stop exercising immediately and seek medical attention. 3)  You need to lose weight. Consider a lower calorie diet and regular exercise.  4)  It is important that your Diabetic A1c level is checked every 3 months. 5)  HbgA1C prior to visit, ICD-9:  in 3  months 6)  BMP prior to visit, ICD-9:   Immunizations Administered:  Influenza Vaccine # 1:    Vaccine Type: Fluvax MCR    Site: left deltoid    Mfr: novartis    Dose: 0.5 ml    Route: IM    Given by: Adella Hare LPN    Exp. Date: 02/2011    Lot #: [id number]    VIS given: 05/12/10 version given July 02, 2010.

## 2010-11-17 NOTE — Assessment & Plan Note (Signed)
Summary: per check out/ok per pat/pt have aterial @ 9:00am/saf   Visit Type:  rov Primary Provider:  Syliva Overman MD  CC:  edema/legs...denies any other complaints today.  History of Present Illness: 77 yo AAF with history of HTN, DM and OA who is here today for PV follow up. I saw her as a new patient 4 weeks ago for PV evaluation. She told  me that she has no pain in the right or left leg. She underwent segmental pressure study in May 2011 which suggested left lower extremity occlusive disease. ABI was normal on the right and reduced mildly at 0.77 on the left. She has no claudication, rest pain or ulcerations. She had left knee replacement and her left leg has been "stiff" behind the knee. She has chronic edema in the left leg with signs of venous stasis.   I ordered non-invasive studies which were performed this am. Her ABI on the right is 1.1 and on the left is 0.91. There were no focal lesions seen. Pressures and waveforms on the left suggest mild tibial disease. She is here for follow up. She has no new complaints. She has no pain in either leg with walking or at rest.    Current Medications (verified): 1)  Caltrate 600+d Plus 600-400 Mg-Unit  Tabs (Calcium Carbonate-Vit D-Min) .... Two Times A Day 2)  Aspirin 81 Mg Tbec (Aspirin) .... Once Daily 3)  Lisinopril-Hydrochlorothiazide 20-12.5 Mg Tabs (Lisinopril-Hydrochlorothiazide) .... Take 1 Tablet By Mouth Once A Day 4)  Vitamin C Cr 500 Mg Cr-Caps (Ascorbic Acid) .... One Daily 5)  Flaxseed  Misc (Flaxseed) .... Two Tablespoons Daily 6)  Metformin Hcl 500 Mg Tabs (Metformin Hcl) .... Take 1 Tablet By Mouth Once A Day 7)  Accu-Chek Aviva  Strp (Glucose Blood) .... Once Daily Testing 8)  Accu-Chek Multiclix Lancets  Misc (Lancets) .... Once Daily Testing  Allergies (verified): No Known Drug Allergies  Past History:  Past Medical History: Reviewed history from 09/19/2009 and no changes required. Osteopenia Arthritis, severe  bilateral knees Diabetes Type 2 Hypertension Colonic polyps, colonoscopy approximately 3 years ago Chronic renal insufficiency  Social History: Reviewed history from 05/19/2010 and no changes required. Retired-daycare teacher Former Smoker-smoked for 15 years. She has not smoked since 1998. 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  The patient denies fatigue, malaise, fever, weight gain/loss, vision loss, decreased hearing, hoarseness, chest pain, palpitations, shortness of breath, prolonged cough, wheezing, sleep apnea, coughing up blood, abdominal pain, blood in stool, nausea, vomiting, diarrhea, heartburn, incontinence, blood in urine, muscle weakness, leg swelling, rash, skin lesions, headache, fainting, dizziness, depression, anxiety, enlarged lymph nodes, easy bruising or bleeding, and environmental allergies.    Vital Signs:  Patient profile:   77 year old female Menstrual status:  hysterectomy Height:      65 inches Weight:      175 pounds BMI:     29.23 Pulse rate:   82 / minute Pulse rhythm:   regular BP sitting:   128 / 80  (left arm) Cuff size:   large  Vitals Entered By: Danielle Rankin, CMA (June 15, 2010 10:17 AM)  Physical Exam  General:  General: Well developed, well nourished, NAD Neuro: No focal deficits Musculoskeletal: Muscle strength 5/5 all ext Psychiatric: Mood and affect normal Lungs:Clear bilaterally, no wheezes, rhonci, crackles CV:  RRR no murmurs, gallops rubs Abdomen: soft, NT, ND, BS present Extremities: Trace  edema left lower ext.Skin changes left leg c/w venous stasis.  Bilateral PT nonpalpable. Bilateral DP 1+.    Arterial Doppler  Procedure date:  06/15/2010  Findings:      Right ABI 1.1. Left ABI 0.91. Duplex imaging with mild smooth plaque from the CFA to the popliteal arteries bilaterally.  Waveforms brisk and triphasic bilateral CFA to popliteal. Right PTA and bilateral ATA waveforms brisk and triphasic. Left PTA is brisk and barely biphasic.   Impression & Recommendations:  Problem # 1:  PERIPHERAL VASCULAR DISEASE (ICD-443.9) No severe obstructive disease. She has no symptoms c/w claudication. No rest pain. Repeat ABI one year. I will see her then. Continue ASA.   Patient Instructions: 1)  Your physician recommends that you schedule a follow-up appointment in 1 year. 2)  Your physician recommends that you continue on your current medications as directed. Please refer to the Current Medication list given to you today. 3)  Your physician has requested that you have an ankle brachial index (ABI) in 1 year. During this test an ultrasound and blood pressure cuff are used to evaluate the arteries that supply the arms and legs with blood. Allow thirty minutes for this exam. There are no restrictions or special instructions.

## 2010-11-17 NOTE — Assessment & Plan Note (Signed)
Summary: FOLLOW UP   Vital Signs:  Patient profile:   77 year old female Menstrual status:  hysterectomy Height:      65 inches Weight:      199.25 pounds BMI:     33.28 O2 Sat:      96 % Pulse rate:   100 / minute Pulse rhythm:   regular Resp:     16 per minute BP sitting:   110 / 74 Cuff size:   large  Vitals Entered By: Everitt Amber (October 23, 2009 9:09 AM)  Nutrition Counseling: Patient's BMI is greater than 25 and therefore counseled on weight management options. CC: Follow up chronic problems, diabetes, HTN   Primary Care Provider:  Syliva Overman MD  CC:  Follow up chronic problems, diabetes, and HTN.  History of Present Illness: Reports  that she has been doing fairly well. She remains active by going to big stores and walking.She uses a cane, she denies any falls. Denies recent fever or chills. Denies sinus pressure, nasal congestion , ear pain or sore throat. Denies chest congestion, or cough productive of sputum. Denies chest pain, palpitations, PND, orthopnea or leg swelling. Denies abdominal pain, nausea, vomitting, diarrhea or constipation. Denies change in bowel movements or bloody stool. Denies dysuria , frequency, incontinence or hesitancy.  Denies headaches, vertigo, seizures. Denies depression, anxiety or insomnia. Denies  rash, lesions, or itch. [name] tests her blood sugars every morning, and she has her diary, which shows excellent control, with her numbers seldom over 110.     Current Medications (verified): 1)  Caltrate 600+d Plus 600-400 Mg-Unit  Tabs (Calcium Carbonate-Vit D-Min) .... Two Times A Day 2)  Aspirin 81 Mg Tbec (Aspirin) .... Once Daily 3)  Lisinopril-Hydrochlorothiazide 20-12.5 Mg Tabs (Lisinopril-Hydrochlorothiazide) .Marland Kitchen.. 1 and 1/2 Tabs Daily 4)  Vitamin C Cr 500 Mg Cr-Caps (Ascorbic Acid) .... One Daily 5)  Flaxseed  Misc (Flaxseed) .... Two Tablespoons Daily 6)  Metformin Hcl 500 Mg Tabs (Metformin Hcl) .... Take 1  Tablet By Mouth Once A Day 7)  Accu-Chek Aviva  Strp (Glucose Blood) .... Once Daily Testing 8)  Accu-Chek Multiclix Lancets  Misc (Lancets) .... Once Daily Testing  Allergies (verified): No Known Drug Allergies  Review of Systems      See HPI Eyes:  Denies discharge, eye pain, and red eye. MS:  Complains of joint pain, low back pain, mid back pain, and stiffness. Neuro:  Denies headaches, seizures, and sensation of room spinning. Endo:  Denies cold intolerance, excessive hunger, excessive thirst, excessive urination, heat intolerance, polyuria, and weight change. Heme:  Denies abnormal bruising and bleeding. Allergy:  Denies hives or rash.  Physical Exam  General:  Well-developed,well-nourished,in no acute distress; alert,appropriate and cooperative throughout examination. Obese. Using cane  HEENT: No facial asymmetry,  EOMI, No sinus tenderness, bilateral cerumen impaction, oropharynx  pink and moist.   Chest: Clear to auscultation bilaterally.  CVS: S1, S2, No murmurs, No S3.   Abd: Soft, Nontender.  ZO:[id number] ROM spine a nd knees Ext: No edema.   CNS: CN 2-12 intact, power tone and sensation normal throughout.   Skin: Intact, no visible lesions or rashes.  Psych: Good eye contact, normal affect.  Memory intact, not anxious or depressed appearing.    Impression & Recommendations:  Problem # 1:  DIABETES MELLITUS, TYPE II, CONTROLLED (ICD-250.00) Assessment Improved  Her updated medication list for this problem includes:    Aspirin 81 Mg Tbec (Aspirin) ..... Once daily  Lisinopril-hydrochlorothiazide 20-12.5 Mg Tabs (Lisinopril-hydrochlorothiazide) .Marland Kitchen... 1 and 1/2 tabs daily    Metformin Hcl 500 Mg Tabs (Metformin hcl) .Marland Kitchen... Take 1 tablet by mouth once a day  Orders: Glucose, (CBG) ([id number]) Hemoglobin A1C ([id number]) T-Urine Microalbumin w/creat. ratio [phone])  Labs Reviewed: Creat: 1.27 (02/13/2009)     Last Eye Exam: All well and advised recheck one  year (08/22/2008) Reviewed HgBA1c results: 6.8 (07/02/2009)  7.1 (04/16/2009)  Problem # 2:  OBESITY NOS (ICD-278.00) Assessment: Deteriorated  Orders: T-Lipid Profile ([id number])  Ht: 65 (10/23/2009)   Wt: 199.25 (10/23/2009)   BMI: 33.28 (10/23/2009)  Problem # 3:  HYPERTENSION (ICD-401.9) Assessment: Improved  Her updated medication list for this problem includes:    Lisinopril-hydrochlorothiazide 20-12.5 Mg Tabs (Lisinopril-hydrochlorothiazide) .Marland Kitchen... 1 and 1/2 tabs daily  Orders: T-Basic Metabolic Panel [id number])  BP today: 110/74 Prior BP: 131/58 (09/19/2009)  Prior 10 Yr Risk Heart Disease: 6 % (07/02/2009)  Labs Reviewed: K+: 4.5 (02/13/2009) Creat: : 1.27 (02/13/2009)   Chol: 156 (02/13/2009)   HDL: 63 (02/13/2009)   LDL: 81 (02/13/2009)   TG: 59 (02/13/2009)  Problem # 4:  OSTEOARTHRITIS (ICD-715.90) Assessment: Unchanged  Her updated medication list for this problem includes:    Aspirin 81 Mg Tbec (Aspirin) ..... Once daily  Complete Medication List: 1)  Caltrate 600+d Plus 600-400 Mg-unit Tabs (Calcium carbonate-vit d-min) .... Two times a day 2)  Aspirin 81 Mg Tbec (Aspirin) .... Once daily 3)  Lisinopril-hydrochlorothiazide 20-12.5 Mg Tabs (Lisinopril-hydrochlorothiazide) .Marland Kitchen.. 1 and 1/2 tabs daily 4)  Vitamin C Cr 500 Mg Cr-caps (Ascorbic acid) .... One daily 5)  Flaxseed Misc (Flaxseed) .... Two tablespoons daily 6)  Metformin Hcl 500 Mg Tabs (Metformin hcl) .... Take 1 tablet by mouth once a day 7)  Accu-chek Aviva Strp (Glucose blood) .... Once daily testing 8)  Accu-chek Multiclix Lancets Misc (Lancets) .... Once daily testing  Other Orders: T-CBC w/Diff [id number]) Zoster (Shingles) Vaccine Live [id number]) Admin 1st Vaccine ([id number])  Patient Instructions: 1)  Nurse visit in 2 weeks for ear irrigation 2)  CPE  first week in May. 3)  Please schedule a follow-up appointment in 4 months. 4)  BMP prior to visit, ICD-9: 5)  Lipid Panel prior to  visit, ICD-9: fasting   April 29 or after 6)  CBC w/ Diff prior to visit, ICD-9: 7)  Urine Microalbumin prior to visit, ICD-9: 8)  You are doing extremely well, pls keep it up  Laboratory Results   Blood Tests   Date/Time Received: October 23, 2009  Date/Time Reported: October 23, 2009   Glucose (random): 140 mg/dL   (Normal Range: 03-474)        Immunizations Administered:  Zostavax # 1:    Vaccine Type: Zostavax    Site: right deltoid    Mfr: Merck    Dose: 0.5 ml    Route: [place]    Given by: Worthy Keeler LPN    Exp. Date: 11/01/2010    Lot #: [id number]    VIS given: 07/30/05 given October 23, 2009.      Appended Document: FOLLOW UP pls insert hBA1c value, it ha not beenrecorded  Appended Document: FOLLOW UP  Laboratory Results   Blood Tests   Date/Time Received: 10/23/2009 Date/Time Reported: 10/23/2009  HGBA1C: 6.5%   (Normal Range: Non-Diabetic - 3-6%   Control Diabetic - 6-8%)

## 2010-11-17 NOTE — Letter (Signed)
Summary: MEDICAL RELEASE  MEDICAL RELEASE   Imported By: Lind Guest 11/04/2009 09:35:46  _____________________________________________________________________  External Attachment:    Type:   Image     Comment:   External Document

## 2010-11-17 NOTE — Assessment & Plan Note (Signed)
Summary: physical   Vital Signs:  Patient profile:   77 year old female Menstrual status:  hysterectomy Height:      65 inches Weight:      167 pounds BMI:     27.89 O2 Sat:      97 % Pulse rate:   80 / minute Pulse rhythm:   regular Resp:     16 per minute BP sitting:   90 / 70  (left arm) Cuff size:   large  Vitals Entered By: Everitt Amber LPN (Mar 03, 2010 8:39 AM)  Nutrition Counseling: Patient's BMI is greater than 25 and therefore counseled on weight management options. CC: CPE   Primary Care Provider:  Syliva Overman MD  CC:  CPE.  History of Present Illness: Reports  that she has been  doing well. Denies recent fever or chills. Denies sinus pressure, nasal congestion , ear pain or sore throat. Denies chest congestion, or cough productive of sputum. Denies chest pain, palpitations, PND, orthopnea or leg swelling. Denies abdominal pain, nausea, vomitting, diarrhea or constipation. Denies change in bowel movements or bloody stool. Denies dysuria , frequency, incontinence or hesitancy. Denies  joint pain, swelling, or reduced mobility. Denies headaches, vertigo, seizures. Denies depression, anxiety or insomnia. Denies  rash, lesions, or itch. She denies symptoms of hypoglycemia, polyuria, polydypsia or blurred vision.     Current Medications (verified): 1)  Caltrate 600+d Plus 600-400 Mg-Unit  Tabs (Calcium Carbonate-Vit D-Min) .... Two Times A Day 2)  Aspirin 81 Mg Tbec (Aspirin) .... Once Daily 3)  Lisinopril-Hydrochlorothiazide 20-12.5 Mg Tabs (Lisinopril-Hydrochlorothiazide) .Marland Kitchen.. 1 and 1/2 Tabs Daily 4)  Vitamin C Cr 500 Mg Cr-Caps (Ascorbic Acid) .... One Daily 5)  Flaxseed  Misc (Flaxseed) .... Two Tablespoons Daily 6)  Metformin Hcl 500 Mg Tabs (Metformin Hcl) .... Take 1 Tablet By Mouth Once A Day 7)  Accu-Chek Aviva  Strp (Glucose Blood) .... Once Daily Testing 8)  Accu-Chek Multiclix Lancets  Misc (Lancets) .... Once Daily Testing  Allergies  (verified): No Known Drug Allergies  Review of Systems      See HPI Eyes:  Denies blurring and discharge. Endo:  Denies cold intolerance, excessive hunger, excessive thirst, excessive urination, heat intolerance, polyuria, and weight change; tests fasting sugars, and they are seldom over 110. Heme:  Denies abnormal bruising and bleeding. Allergy:  Denies hives or rash and itching eyes.  Physical Exam  General:  Well-developed,well-nourished,in no acute distress; alert,appropriate and cooperative throughout examination Head:  Normocephalic and atraumatic without obvious abnormalities. No apparent alopecia or balding. Eyes:  No corneal or conjunctival inflammation noted. EOMI. Perrla. Funduscopic exam benign, without hemorrhages, exudates or papilledema. Vision grossly normal. Ears:  External ear exam shows no significant lesions or deformities.  Otoscopic examination reveals clear canals, tympanic membranes are intact bilaterally without bulging, retraction, inflammation or discharge. Hearing is grossly normal bilaterally. Nose:  External nasal examination shows no deformity or inflammation. Nasal mucosa are pink and moist without lesions or exudates. Mouth:  pharynx pink and moist and fair dentition.   Neck:  No deformities, masses, or tenderness noted. Chest Wall:  No deformities, masses, or tenderness noted. Breasts:  No mass, nodules, thickening, tenderness, bulging, retraction, inflamation, nipple discharge or skin changes noted.   Lungs:  Normal respiratory effort, chest expands symmetrically. Lungs are clear to auscultation, no crackles or wheezes. Heart:  Normal rate and regular rhythm. S1 and S2 normal without gallop, murmur, click, rub or other extra sounds. Abdomen:  Bowel sounds positive,abdomen  soft and non-tender without masses, organomegaly or hernias noted. Rectal:  No external abnormalities noted. Normal sphincter tone. No rectal masses or tenderness.guaic neg  stool Genitalia:  normal introitus, no external lesions, no vaginal discharge, and no adnexal masses or tenderness.  Uteruus absent  Msk:  No deformity or scoliosis noted of thoracic or lumbar spine.   Pulses:  R and L carotid,radial,femoral,dorsalis pedis and posterior tibial pulses are full and equal bilaterally Extremities:  decreased ROM spine , hips and knees Neurologic:  No cranial nerve deficits noted. Station and gait are normal. Plantar reflexes are down-going bilaterally. DTRs are symmetrical throughout. Sensory, motor and coordinative functions appear intact.  Diabetes Management Exam:    Foot Exam (with socks and/or shoes not present):       Sensory-Monofilament:          Left foot: diminished          Right foot: diminished       Inspection:          Left foot: normal          Right foot: normal       Nails:          Left foot: thickened          Right foot: thickened   Impression & Recommendations:  Problem # 1:  SPECIAL SCREENING FOR MALIGNANT NEOPLASMS COLON (ICD-V76.51) Assessment Comment Only  Orders: Hemoccult Guaiac-1 spec.(in office) ([id number])  Problem # 2:  OBESITY NOS (ICD-278.00) Assessment: Improved  Ht: 65 (03/03/2010)   Wt: 167 (03/03/2010)   BMI: 27.89 (03/03/2010)  Problem # 3:  HYPERTENSION (ICD-401.9) Assessment: Comment Only  Her updated medication list for this problem includes:    Lisinopril-hydrochlorothiazide 20-12.5 Mg Tabs (Lisinopril-hydrochlorothiazide) .Marland Kitchen... 1 and 1/2 tabs daily  BP today: 90/70, overcorrected, decreeased BP med to ONE daily Prior BP: 122/70 (11/06/2009)  Prior 10 Yr Risk Heart Disease: 6 % (07/02/2009)  Labs Reviewed: K+: 4.6 (02/20/2010) Creat: : 1.38 (02/20/2010)   Chol: 156 (02/20/2010)   HDL: 64 (02/20/2010)   LDL: 75 (02/20/2010)   TG: 83 (02/20/2010)  Problem # 4:  DIABETES MELLITUS, TYPE II, CONTROLLED (ICD-250.00) Assessment: Comment Only  Her updated medication list for this problem includes:     Aspirin 81 Mg Tbec (Aspirin) ..... Once daily    Lisinopril-hydrochlorothiazide 20-12.5 Mg Tabs (Lisinopril-hydrochlorothiazide) .Marland Kitchen... 1 and 1/2 tabs daily    Metformin Hcl 500 Mg Tabs (Metformin hcl) .Marland Kitchen... Take 1 tablet by mouth once a day  Orders: T- Hemoglobin A1C ([id number])  Labs Reviewed: Creat: 1.38 (02/20/2010)     Last Eye Exam: All well and advised recheck one year (08/22/2008) Reviewed HgBA1c results: 6.5 (10/30/2009)  6.8 (07/02/2009)  Complete Medication List: 1)  Caltrate 600+d Plus 600-400 Mg-unit Tabs (Calcium carbonate-vit d-min) .... Two times a day 2)  Aspirin 81 Mg Tbec (Aspirin) .... Once daily 3)  Lisinopril-hydrochlorothiazide 20-12.5 Mg Tabs (Lisinopril-hydrochlorothiazide) .Marland Kitchen.. 1 and 1/2 tabs daily 4)  Vitamin C Cr 500 Mg Cr-caps (Ascorbic acid) .... One daily 5)  Flaxseed Misc (Flaxseed) .... Two tablespoons daily 6)  Metformin Hcl 500 Mg Tabs (Metformin hcl) .... Take 1 tablet by mouth once a day 7)  Accu-chek Aviva Strp (Glucose blood) .... Once daily testing 8)  Accu-chek Multiclix Lancets Misc (Lancets) .... Once daily testing  Other Orders: T-TSH [phone]) T-Vitamin D (25-Hydroxy) [phone]) Radiology Referral (Radiology) Pelvic & Breast Exam ( Medicare)  ([id number])   Patient Instructions: 1)  Please schedule a follow-up appointment in  2)  in 6 weeks, ear flush on that day also pls 3)  TSH prior to visit, ICD-9: 4)  HbgA1C prior to visit, ICD-9:  today 5)  Vitamin D 6)  You will be referred for   testing for PAD. 7)  Reduce blood pressure med to oNE daily  Laboratory Results    Stool - Occult Blood Hemmoccult #1: negative Date: 02/24/2010 Comments: [id number] 9R 05/2011 118/10/12

## 2010-11-17 NOTE — Miscellaneous (Signed)
Summary: refill  Clinical Lists Changes  Medications: Rx of LISINOPRIL-HYDROCHLOROTHIAZIDE 20-12.5 MG TABS (LISINOPRIL-HYDROCHLOROTHIAZIDE) 1 AND 1/2 TABS DAILY;  #45 x 3;  Signed;  Entered by: Everitt Amber;  Authorized by: Syliva Overman MD;  Method used: Electronically to [facility] [address] [place], [place], [place]  [address], Ph: [phone], Fax: [phone]    Prescriptions: LISINOPRIL-HYDROCHLOROTHIAZIDE 20-12.5 MG TABS (LISINOPRIL-HYDROCHLOROTHIAZIDE) 1 AND 1/2 TABS DAILY  #45 x 3   Entered by:   Everitt Amber   Authorized by:   Syliva Overman MD   Signed by:   Everitt Amber on 10/20/2009   Method used:   Electronically to        [facility]  [address] (retail)       [address]       [place], [place]  [address]       Ph: [phone]       Fax: [phone]   RxID:   [id number]

## 2010-11-17 NOTE — Medication Information (Signed)
Summary: Tax adviser   Imported By: Lind Guest 03/04/2010 08:19:52  _____________________________________________________________________  External Attachment:    Type:   Image     Comment:   External Document

## 2010-11-17 NOTE — Assessment & Plan Note (Signed)
Summary: ear cleaning  Nurse Visit   Vital Signs:  Patient profile:   77 year old female Menstrual status:  hysterectomy Height:      65 inches Weight:      195.75 pounds BMI:     32.69 BP sitting:   122 / 70  (left arm)  Vitals Entered By: Worthy Keeler LPN (November 06, 2009 1:41 PM) Comments patient in today for bilateral ear irrigation. both ears cleared. pt tolerated procedure well.    Allergies: No Known Drug Allergies  Orders Added: 1)  Cerumen Impaction Removal [[id number]]

## 2010-11-17 NOTE — Assessment & Plan Note (Signed)
Summary: npv/abn abi's/jml   Primary Provider:  Syliva Overman MD  CC:  npv.abn abi's.  Pt SOB and having stiffness in left leg.Marland Kitchen  History of Present Illness: 77 yo AAF with history of HTN, DM and OA who presents for PV evaluation. She tells me that she has no pain in the right or left leg. She underwent segmental pressure study in May 2011 which suggested left lower extremity occlusive disease. ABI was normal on the right and reduced mildly at 0.77 on the left. She has no claudication, rest pain or ulcerations. She had left knee replacement and her left leg has been "stiff" behind the knee. She has chronic edema in the left leg with signs of venous stasis.   Current Medications (verified): 1)  Caltrate 600+d Plus 600-400 Mg-Unit  Tabs (Calcium Carbonate-Vit D-Min) .... Two Times A Day 2)  Aspirin 81 Mg Tbec (Aspirin) .... Once Daily 3)  Lisinopril-Hydrochlorothiazide 20-12.5 Mg Tabs (Lisinopril-Hydrochlorothiazide) .... Take 1 Tablet By Mouth Once A Day 4)  Vitamin C Cr 500 Mg Cr-Caps (Ascorbic Acid) .... One Daily 5)  Flaxseed  Misc (Flaxseed) .... Two Tablespoons Daily 6)  Metformin Hcl 500 Mg Tabs (Metformin Hcl) .... Take 1 Tablet By Mouth Once A Day 7)  Accu-Chek Aviva  Strp (Glucose Blood) .... Once Daily Testing 8)  Accu-Chek Multiclix Lancets  Misc (Lancets) .... Once Daily Testing  Allergies (verified): No Known Drug Allergies  Past History:  Past Medical History: Reviewed history from 09/19/2009 and no changes required. Osteopenia Arthritis, severe bilateral knees Diabetes Type 2 Hypertension Colonic polyps, colonoscopy approximately 3 years ago Chronic renal insufficiency  Past Surgical History: Reviewed history from 09/19/2009 and no changes required. 1. TAH - bleeding 2. Left knee replacement - Dr. Romeo Apple - August 14, 2008 3. Left knee manipulation under anasthesia due to contracture - Mar 08, 2008 - Dr. Romeo Apple 4. Bunionectomy bilateral feet 5. Wisdom  Teeth 6. Gum disease (treated by Dr. Pernell Dupre) 2010  Family History: Reviewed history from 02/12/2009 and no changes required. Father: Dead 43 - Colon Cancer Mother: Dead 64 - CVA and cancer but unsure of type. Siblings: Sisters x 2 age 50 - healthy, 28 - healthy , Brothers x 1 age 23 - HTN KIds - Daughters x 2 age 40 MS, 55 and healthy Son 78 - healthy  Social History: Reviewed history from 09/03/2009 and no changes required. Retired-daycare teacher Former Smoker-smoked for 15 years. She has not smoked since 1998. 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  The patient denies fatigue, malaise, fever, weight gain/loss, vision loss, decreased hearing, hoarseness, chest pain, palpitations, shortness of breath, prolonged cough, wheezing, sleep apnea, coughing up blood, abdominal pain, blood in stool, nausea, vomiting, diarrhea, heartburn, incontinence, blood in urine, muscle weakness, rash, skin lesions, headache, fainting, dizziness, depression, anxiety, enlarged lymph nodes, easy bruising or bleeding, and environmental allergies.    Vital Signs:  Patient profile:   77 year old female Menstrual status:  hysterectomy Height:      65 inches Weight:      180 pounds BMI:     30.06 O2 Sat:      97 % on Room air Pulse rate:   96 / minute Pulse rhythm:   irregular BP sitting:   120 / 72  (left arm) Cuff size:  regular  Vitals Entered By: Judithe Modest CMA (May 19, 2010 2:55 PM)  O2 Flow:  Room air  Physical Exam  General:  General: Well developed, well nourished, NAD HEENT: OP clear, mucus membranes moist SKIN: warm, dry Neuro: No focal deficits Musculoskeletal: Muscle strength 5/5 all ext Psychiatric: Mood and affect normal Neck: No JVD, no carotid bruits, no thyromegaly, no lymphadenopathy. Lungs:Clear bilaterally,  no wheezes, rhonci, crackles CV: RRR no murmurs, gallops rubs Abdomen: soft, NT, ND, BS present Extremities: Trace  edema left lower ext.Skin changes left leg c/w venous stasis.  Bilateral PT nonpalpable. Bilateral DP 1+.    Impression & Recommendations:  Problem # 1:  PERIPHERAL VASCULAR DISEASE (ICD-443.9) Segmental pressures at outside testing center suggestive of left leg arterial disease. Will arrange full lower extremity dopplers to further evaluate. She has no claudication or rest pain. We have discussed possible angiogram based on findings on the dopplers.   Other Orders: Arterial Duplex Lower Extremity (Arterial Duplex Low)  Patient Instructions: 1)  Your physician recommends that you schedule a follow-up appointment in: 3-4 weeks 2)  Your physician has requested that you have a lower or upper extremity arterial duplex.  This test is an ultrasound of the arteries in the legs or arms.  It looks at arterial blood flow in the legs and arms.  Allow one hour for Lower and Upper Arterial scans. There are no restrictions or special instructions. To be done on same day as appointment with Dr. Clifton James in 3-4 weeks

## 2010-11-17 NOTE — Assessment & Plan Note (Signed)
Summary: to eval for stress echo per Dr.Simpson/tg   Visit Type:  Follow-up Primary Provider:  Syliva Overman MD  CC:  to evaluate for stress echo per simpson.  History of Present Illness: [name] is a 59 F we are seeing at the request of Dr. Lodema Hong secondary to abnormal ABI's with recent testing done in the setting of PVD.  She has a history of hypertension, DM, osteoarthritis.  ABI's done on05/20/2011 demonstrated Right at 1.01; left at 0.77.  Segmental pressures suggested both aortoilliac and femerol popliteal occlusive components on the left.  We are asked to evaluate further.  She complains most of pain behind her L knee and LE numbness over the last several months.  Pain usually occurs with prolonged sitting. She has chronic LEE on the left.   She complains of L leg stiffness as well.  She denies lack of strength or loss of fx of the left leg.  She is able to bear weight without weakness in the left leg.  Current Medications (verified): 1)  Caltrate 600+d Plus 600-400 Mg-Unit  Tabs (Calcium Carbonate-Vit D-Min) .... Two Times A Day 2)  Aspirin 81 Mg Tbec (Aspirin) .... Once Daily 3)  Lisinopril-Hydrochlorothiazide 20-12.5 Mg Tabs (Lisinopril-Hydrochlorothiazide) .... Take 1 Tablet By Mouth Once A Day 4)  Vitamin C Cr 500 Mg Cr-Caps (Ascorbic Acid) .... One Daily 5)  Flaxseed  Misc (Flaxseed) .... Two Tablespoons Daily 6)  Metformin Hcl 500 Mg Tabs (Metformin Hcl) .... Take 1 Tablet By Mouth Once A Day 7)  Accu-Chek Aviva  Strp (Glucose Blood) .... Once Daily Testing 8)  Accu-Chek Multiclix Lancets  Misc (Lancets) .... Once Daily Testing  Allergies (verified): No Known Drug Allergies  Past History:  Past medical, surgical, family and social histories (including risk factors) reviewed, and no changes noted (except as noted below).  Past Medical History: Reviewed history from 09/19/2009 and no changes required. Osteopenia Arthritis, severe bilateral knees Diabetes Type  2 Hypertension Colonic polyps, colonoscopy approximately 3 years ago Chronic renal insufficiency  Past Surgical History: Reviewed history from 09/19/2009 and no changes required. 1. TAH - bleeding 2. Left knee replacement - Dr. Romeo Apple - August 14, 2008 3. Left knee manipulation under anasthesia due to contracture - Mar 08, 2008 - Dr. Romeo Apple 4. Bunionectomy bilateral feet 5. Wisdom Teeth 6. Gum disease (treated by Dr. Pernell Dupre) 2010  Family History: Reviewed history from 02/12/2009 and no changes required. Father: Dead 28 - Colon Cancer Mother: Dead 77 - CVA and cancer but unsure of type. Siblings: Sisters x 2 age 78 - healthy, 68 - healthy , Brothers x 1 age 5 - HTN KIds - Daughters x 2 age 51 MS, 79 and healthy Son 34 - healthy  Social History: Reviewed history from 09/03/2009 and no changes required. 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  All other systems have been reviewed and are negative unless stated above.   Vital Signs:  Patient profile:   77 year old female Menstrual status:  hysterectomy Weight:      191 pounds Pulse rate:   90 / minute BP sitting:   130 / 69  (right arm)  Vitals Entered By: Dreama Saa, CNA (April 22, 2010 1:51 PM)  Physical Exam  Neck:  Neck supple, no JVD.  No masses, thyromegaly or abnormal cervical nodes. Lungs:  Clear bilaterally to auscultation and percussion. Heart:  Grade 1 /6 SEM loudest primary aortic area  RRR no rubs or gallops. Abdomen:  Bowel sounds positive; abdomen soft and non-tender without masses, organomegaly, or hernias noted. No hepatosplenomegaly. No abdominal bruits. Msk:  Back normal, normal gait. Muscle strength and tone normal. Pulses:  diminished L popliteal.   Extremities:  LEE nonpitting in  the left leg.  No femerol bruits ascultated on the right or left. Neurologic:  Alert and oriented x 3. Psych:  Normal affect.   EKG  Procedure date:  04/22/2010  Findings:      Normal sinus rhythm with rate of: 75bpm  PAC's noted.    Impression & Recommendations:  Problem # 1:  PERIPHERAL VASCULAR DISEASE (ICD-443.9) ABI completed per Dr. Lodema Hong were abnormal  on the left side with value of 0.77.  She is symptomatic with stiffness and pain behind the left leg, mild edema and numbness.  She will be referred to Dr. Excell Seltzer for evaluation of PAD in light of these abnormal values.  More recommendations for aortogram to be completed at his disecretion. She has a history of chronic renal insufficiency in the past. Labs will need to be assessed when/if more invasive studies are completed.  Problem # 2:  HYPERTENSION (ICD-401.9) Assessment: Unchanged  Her updated medication list for this problem includes:    Aspirin 81 Mg Tbec (Aspirin) ..... Once daily    Lisinopril-hydrochlorothiazide 20-12.5 Mg Tabs (Lisinopril-hydrochlorothiazide) .Marland Kitchen... Take 1 tablet by mouth once a day  Orders: EKG w/ Interpretation ([id number])  Patient Instructions: 1)  Your physician recommends that you schedule a follow-up appointment with Dr. Excell Seltzer or Dr. Clifton James 2)  Your physician recommends that you continue on your current medications as directed. Please refer to the Current Medication list given to you today.

## 2010-11-19 NOTE — Assessment & Plan Note (Signed)
Summary: ear irrigation  Nurse Visit   Vitals Entered By: Everitt Amber LPN (November 11, 2010 8:02 AM) Comments Patient in office today for a bilateral ear flushing. Patient tolerated procedure well and ears successfully irrigated with no complications. Was checked by Dr. Lodema Hong    Allergies: No Known Drug Allergies  Orders Added: 1)  Cerumen Impaction Removal [[id number]] succesful bilateral ear irrigation, no complications

## 2010-11-19 NOTE — Assessment & Plan Note (Signed)
Summary: F UP   Vital Signs:  Patient profile:   77 year old female Menstrual status:  hysterectomy Height:      65 inches Weight:      187.25 pounds BMI:     31.27 O2 Sat:      99 % on Room air Pulse rate:   91 / minute Pulse rhythm:   regular Resp:     16 per minute BP sitting:   122 / 80  (left arm)  Vitals Entered By: Adella Hare LPN (October 01, 2010 9:20 AM)  Nutrition Counseling: Patient's BMI is greater than 25 and therefore counseled on weight management options.  O2 Flow:  Room air CC: follow-up visit Is Patient Diabetic? Yes Did you bring your meter with you today? No   Primary Care Provider:  Syliva Overman MD  CC:  follow-up visit.  History of Present Illness: Reports  that she has been  doing well. Denies recent fever or chills. Denies sinus pressure, nasal congestion , ear pain or sore throat. Denies chest congestion, or cough productive of sputum. Denies chest pain, palpitations, PND, orthopnea or leg swelling. Denies abdominal pain, nausea, vomitting, diarrhea or constipation. Denies change in bowel movements or bloody stool. Denies dysuria , frequency, incontinence or hesitancy.  Denies headaches, vertigo, seizures. Denies depression, anxiety or insomnia. Denies  rash, lesions, or itch.     Current Medications (verified): 1)  Caltrate 600+d Plus 600-400 Mg-Unit  Tabs (Calcium Carbonate-Vit D-Min) .... Two Times A Day 2)  Aspirin 81 Mg Tbec (Aspirin) .... Once Daily 3)  Lisinopril-Hydrochlorothiazide 20-12.5 Mg Tabs (Lisinopril-Hydrochlorothiazide) .... Take 1 Tablet By Mouth Once A Day 4)  Vitamin C Cr 500 Mg Cr-Caps (Ascorbic Acid) .... One Daily 5)  Flaxseed  Misc (Flaxseed) .... Two Tablespoons Daily 6)  Accu-Chek Aviva  Strp (Glucose Blood) .... Once Daily Testing 7)  Accu-Chek Multiclix Lancets  Misc (Lancets) .... Once Daily Testing  Allergies (verified): No Known Drug Allergies  Review of Systems      See HPI Eyes:  Complains of  vision loss-both eyes; denies discharge, eye pain, itching, and red eye. MS:  Complains of joint pain and stiffness. Endo:  Denies cold intolerance, excessive hunger, excessive thirst, excessive urination, and heat intolerance. Heme:  Denies abnormal bruising, bleeding, enlarge lymph nodes, and fevers. Allergy:  Denies hives or rash.  Physical Exam  General:  Well-developed,well-nourished,in no acute distress; alert,appropriate and cooperative throughout examination HEENT: No facial asymmetry,  EOMI, No sinus tenderness, TM's Clear, oropharynx  pink and moist.   Chest: Clear to auscultation bilaterally.  CVS: S1, S2, No murmurs, No S3.   Abd: Soft, Nontender.  MS: decreased  ROM spine, hips, shoulders and knees.  Ext: No edema.   CNS: CN 2-12 intact, power tone and sensation normal throughout.   Skin: Intact, no visible lesions or rashes.  Psych: Good eye contact, normal affect.  Memory intact, not anxious or depressed appearing.    Impression & Recommendations:  Problem # 1:  DIABETES MELLITUS, TYPE II (ICD-250.00) Assessment Deteriorated  The following medications were removed from the medication list:    Metformin Hcl 500 Mg Tabs (Metformin hcl) .Marland Kitchen... Take 1 tablet by mouth once a day Her updated medication list for this problem includes:    Aspirin 81 Mg Tbec (Aspirin) ..... Once daily    Lisinopril-hydrochlorothiazide 20-12.5 Mg Tabs (Lisinopril-hydrochlorothiazide) .Marland Kitchen... Take 1 tablet by mouth once a day    Metformin Hcl 500 Mg Tabs (Metformin hcl) .Marland Kitchen... Take  1 tablet by mouth once a day  Labs Reviewed: Creat: 1.16 (09/25/2010)     Last Eye Exam: All well and advised recheck one year (08/22/2008) Reviewed HgBA1c results: 6.4 (09/25/2010)  6.1 (06/30/2010)  Orders: T- Hemoglobin A1C ([id number]) Medicare Electronic Prescription (X5284)  Problem # 2:  HYPERTENSION (ICD-401.9) Assessment: Unchanged  Her updated medication list for this problem includes:     Lisinopril-hydrochlorothiazide 20-12.5 Mg Tabs (Lisinopril-hydrochlorothiazide) .Marland Kitchen... Take 1 tablet by mouth once a day  Orders: T-Basic Metabolic Panel [phone])  BP today: 122/80 Prior BP: 110/60 (07/02/2010)  Prior 10 Yr Risk Heart Disease: 6 % (07/02/2009)  Labs Reviewed: K+: 4.1 (09/25/2010) Creat: : 1.16 (09/25/2010)   Chol: 156 (02/20/2010)   HDL: 64 (02/20/2010)   LDL: 75 (02/20/2010)   TG: 83 (02/20/2010)  Problem # 3:  OSTEOARTHRITIS (ICD-715.90) Assessment: Unchanged  Her updated medication list for this problem includes:    Aspirin 81 Mg Tbec (Aspirin) ..... Once daily  Complete Medication List: 1)  Caltrate 600+d Plus 600-400 Mg-unit Tabs (Calcium carbonate-vit d-min) .... Two times a day 2)  Aspirin 81 Mg Tbec (Aspirin) .... Once daily 3)  Lisinopril-hydrochlorothiazide 20-12.5 Mg Tabs (Lisinopril-hydrochlorothiazide) .... Take 1 tablet by mouth once a day 4)  Vitamin C Cr 500 Mg Cr-caps (Ascorbic acid) .... One daily 5)  Flaxseed Misc (Flaxseed) .... Two tablespoons daily 6)  Accu-chek Aviva Strp (Glucose blood) .... Once daily testing 7)  Accu-chek Multiclix Lancets Misc (Lancets) .... Once daily testing 8)  Metformin Hcl 500 Mg Tabs (Metformin hcl) .... Take 1 tablet by mouth once a day 9)  Multivitamins Tabs (Multiple vitamin) .... Take 1 tablet by mouth once a day  Patient Instructions: 1)  Please schedule a follow-up appointment in 4 months. 2)  It is important that you exercise regularly at least 20 minutes 5 times a week. If you develop chest pain, have severe difficulty breathing, or feel very tired , stop exercising immediately and seek medical attention. 3)  You need to lose weight. Consider a lower calorie diet and regular exercise.  4)  BMP prior to visit, ICD-9: 5)  HbgA1C prior to visit, ICD-9: 6)  Pls resume the metformin  7)  pls start one multivitamin daily. Prescriptions: MULTIVITAMINS  TABS (MULTIPLE VITAMIN) Take 1 tablet by mouth once a  day  #90 x 3   Entered by:   Adella Hare LPN   Authorized by:   Syliva Overman MD   Signed by:   Adella Hare LPN on 25/36/6440   Method used:   Faxed to ...       [facility] (mail-order)             , [place]         Ph: [phone]       Fax: [phone]   RxID:   [id number] METFORMIN HCL 500 MG TABS (METFORMIN HCL) Take 1 tablet by mouth once a day  #90 x 1   Entered by:   Adella Hare LPN   Authorized by:   Syliva Overman MD   Signed by:   Adella Hare LPN on 93/23/5573   Method used:   Faxed to ...       [facility] (mail-order)             , [place]         Ph: [phone]       Fax: [phone]   RxID:   [id number] MULTIVITAMINS  TABS (MULTIPLE VITAMIN) Take 1 tablet by  mouth once a day  #90 x 3   Entered and Authorized by:   Syliva Overman MD   Signed by:   Syliva Overman MD on 10/01/2010   Method used:   Printed then faxed to ...       [facility]  [address] [facility] (retail)       [address]       [place], [place]  [address]       Ph: [phone]       Fax: [phone]   RxID:   [id number] METFORMIN HCL 500 MG TABS (METFORMIN HCL) Take 1 tablet by mouth once a day  #90 x 1   Entered and Authorized by:   Syliva Overman MD   Signed by:   Syliva Overman MD on 10/01/2010   Method used:   Historical   RxID:   [id number]    Orders Added: 1)  Est. Patient Level IV [29518] 2)  T-Basic Metabolic Panel [[id number]] 3)  T- Hemoglobin A1C [[id number]] 4)  Medicare Electronic Prescription [A4166]

## 2010-11-20 NOTE — Progress Notes (Signed)
Summary: EYE EXAM  EYE EXAM   Imported By: Lind Guest 11/04/2009 08:32:46  _____________________________________________________________________  External Attachment:    Type:   Image     Comment:   External Document

## 2010-12-14 ENCOUNTER — Telehealth: Payer: Self-pay | Admitting: Family Medicine

## 2010-12-15 ENCOUNTER — Encounter: Payer: Self-pay | Admitting: Family Medicine

## 2010-12-24 NOTE — Progress Notes (Signed)
Summary: bp medicine  Phone Note Call from Patient   Summary of Call: needs for you to call her about her bp medicine call back at [phone] Initial call taken by: Lind Guest,  December 14, 2010 9:28 AM    Prescriptions: LISINOPRIL-HYDROCHLOROTHIAZIDE 20-12.5 MG TABS (LISINOPRIL-HYDROCHLOROTHIAZIDE) Take 1 tablet by mouth once a day  #30 x 1   Entered by:   Adella Hare LPN   Authorized by:   Syliva Overman MD   Signed by:   Adella Hare LPN on 11/91/4782   Method used:   Electronically to        [facility]  [address] (retail)       [address]       [place], [place]  [address]       Ph: [phone]       Fax: [phone]   RxID:   [id number]

## 2010-12-24 NOTE — Medication Information (Signed)
Summary: Tax adviser   Imported By: Lind Guest 12/15/2010 13:56:00  _____________________________________________________________________  External Attachment:    Type:   Image     Comment:   External Document

## 2011-01-28 ENCOUNTER — Encounter: Payer: Self-pay | Admitting: Family Medicine

## 2011-01-29 ENCOUNTER — Encounter: Payer: Self-pay | Admitting: Family Medicine

## 2011-01-29 ENCOUNTER — Other Ambulatory Visit: Payer: Self-pay | Admitting: Family Medicine

## 2011-01-30 LAB — BASIC METABOLIC PANEL
Glucose, Bld: 94 mg/dL (ref 70–99)
Potassium: 4.7 mEq/L (ref 3.5–5.3)
Sodium: 144 mEq/L (ref 135–145)

## 2011-01-30 LAB — HEMOGLOBIN A1C
Hgb A1c MFr Bld: 6.5 % — ABNORMAL HIGH (ref <5.7)
Mean Plasma Glucose: 140 mg/dL — ABNORMAL HIGH (ref <117)

## 2011-02-02 ENCOUNTER — Ambulatory Visit (INDEPENDENT_AMBULATORY_CARE_PROVIDER_SITE_OTHER): Payer: Medicare Other | Admitting: Family Medicine

## 2011-02-02 ENCOUNTER — Encounter: Payer: Self-pay | Admitting: Family Medicine

## 2011-02-02 VITALS — BP 110/60 | HR 57 | Resp 16 | Ht 66.0 in | Wt 185.1 lb

## 2011-02-02 DIAGNOSIS — Z79899 Other long term (current) drug therapy: Secondary | ICD-10-CM

## 2011-02-02 DIAGNOSIS — R5381 Other malaise: Secondary | ICD-10-CM

## 2011-02-02 DIAGNOSIS — E119 Type 2 diabetes mellitus without complications: Secondary | ICD-10-CM

## 2011-02-02 DIAGNOSIS — E860 Dehydration: Secondary | ICD-10-CM

## 2011-02-02 DIAGNOSIS — R5383 Other fatigue: Secondary | ICD-10-CM

## 2011-02-02 NOTE — Progress Notes (Signed)
  Subjective:    Patient ID: [name], female    DOB: November 18, 1933, 77 y.o.   MRN: [id number]  Hypertension This is a chronic problem. The current episode started more than 1 year ago. The problem is unchanged. The problem is controlled. There are no associated agents to hypertension. Risk factors for coronary artery disease include diabetes mellitus and post-menopausal state. Past treatments include diuretics and ACE inhibitors. The current treatment provides significant improvement.  Diabetes She presents for her follow-up diabetic visit. She has type 2 diabetes mellitus. No MedicAlert identification noted. Her disease course has been stable. There are no hypoglycemic associated symptoms. There are no diabetic associated symptoms. Symptoms are stable. She is compliant with treatment all of the time. Her weight is stable. She has had a previous visit with a dietician. She participates in exercise daily. Her breakfast blood glucose range is generally 90-110 mg/dl. An ACE inhibitor/angiotensin II receptor blocker is being taken. She sees a podiatrist.Eye exam is current.   The PT is here for follow up and re-evaluation of chronic medical conditions, medication management and review of recent lab and radiology data.  Preventive health is updated, specifically  Cancer screening, Osteoporosis screening and Immunization.   Questions or concerns regarding consultations or procedures which the PT has had in the interim are  Addressed.She recently had to see podiatry because of left 4th toe pain , which is improving The PT denies any adverse reactions to current medications since the last visit.  There are no new concerns.  There are no specific complaints      Review of Systems Denies recent fever or chills. Denies sinus pressure, nasal congestion, ear pain or sore throat. Denies chest congestion, productive cough or wheezing. Denies chest pains, palpitations, paroxysmal nocturnal dyspnea,  orthopnea and leg swelling Denies abdominal pain, nausea, vomiting,diarrhea or constipation.  Denies rectal bleeding or change in bowel movement. Denies dysuria, frequency, hesitancy or incontinence. Chronic arthritic pain which limits her mobility, recently she has been experiencing increased right knee pain with some instabiliyty Denies depression, anxiety or insomnia. Denies skin break down or rash.        Objective:   Physical Exam Patient alert and oriented and in no Cardiopulmonary distress.  HEENT: No facial asymmetry, EOMI, no sinus tenderness, TM's clear, Oropharynx pink and moist.  Neck supple no adenopathy.  Chest: Clear to auscultation bilaterally.  CVS: S1, S2 no murmurs, no S3.  ABD: Soft non tender. Bowel sounds normal.  Ext: No edema  MS: decreased  ROM spine, shoulders, hips and knees.  Skin: Intact, no ulcerations or rash noted.  Psych: Good eye contact, normal affect. Memory intact not anxious or depressed appearing.  CNS: CN 2-12 intact, power, tone and sensation normal throughout.        Assessment & Plan:  1. Hypertension:Controlled, no changes in medication.  2. Type 2DM: controlled, no med change. 3.Arthritis: deteriorated, reports of knee instability, advised pt to consider ortho re-eval Routine health maintenance is currently up to date4.

## 2011-02-02 NOTE — Patient Instructions (Addendum)
F/u in 4 months.Needs rectal at that visit.  Chem 7 in 1 week and fasting cbc, chem7, lipid, TSH and HBA1C and microalb in 4 months.  Pls reduce your high carb fruits.  No med changes at this time  Your sugar is fine  Mammogram due in mid September, pls schedule  You will need to see orthopedics if your right knee gets worse.  Call the ins company to hold on sending the metformin since you have a lot at home, continue one daily pls

## 2011-02-11 LAB — BASIC METABOLIC PANEL
BUN: 27 mg/dL — ABNORMAL HIGH (ref 6–23)
Chloride: 103 mEq/L (ref 96–112)
Potassium: 4.1 mEq/L (ref 3.5–5.3)

## 2011-02-17 NOTE — Progress Notes (Signed)
Patient aware.

## 2011-03-02 NOTE — Consult Note (Signed)
[name], [name]              ACCOUNT NO.:  [id number]   MEDICAL RECORD NO.:  [id number]          PATIENT TYPE:  INP   LOCATION:  A340                          FACILITY:  [facility]   PHYSICIAN:  Thomasenia Bottoms, MDDATE OF BIRTH:  [birth year]   DATE OF CONSULTATION:  08/16/2007  DATE OF DISCHARGE:                                 CONSULTATION   REASON FOR CONSULTATION:  Hypotension postoperatively.   [name] is a 77 year old woman who is status post left total knee  replacement on August 15, 2007.   MEDICAL ASSESSMENT:  1. Transient hypotension, now resolved.  2. Anemia secondary to surgical blood loss or third spacing.  3. Chronic renal insufficiency.  4. History of hypertension.  5. Osteopenia.   RECOMMENDATIONS:  There is one blood pressure reading from 4 a.m.  October 29th that is 77/46.  The next reading is 111/59 and the one  after that is 113/60.  This episode of hypotension was transient.  I am  not completely sure that it is an accurate blood pressure.  If it is, it  may have been secondary to some mild volume depletion.  The patient is  on IV fluids and steadily the IV fluids could have helped to restore her  intervascular volume.  The patient's hemoglobin preoperatively was 13.5.  It is now 9.8 so this change coupled with the fact that if she is on IV  Dilaudid PCA could have helped to temporarily lower her blood pressure  reading.  I do not think this is significant.  The patient has no signs  or symptoms of sepsis as cause of this hypotension.  She has no signs or  symptoms of a cardiogenic cause of this hypotension.   RECOMMENDATIONS:  1. We will hold her diuretic medications until discharge.  2. We will check an EKG, a CPK, and troponin but I doubt that there is      any cardiogenic cause.  3. We will monitor her carefully for severe tachycardia or high fever      or signs of sepsis.  4. We will repeat her H&H in the morning.  Otherwise, we would just     continue to monitor her vitals in a routine fashion.   VITALS:  Last set of vitals revealed a temp of 99, pulse 69, respiratory  rate 20, systolic blood pressure 116/60, and she is satting 98% on room  air.   PHYSICAL EXAMINATION:  The patient is pleasant, well nourished, well  developed, and in no acute distress.  HEENT:  Normocephalic, atraumatic.  Her sclerae are nonicteric.  Oral  mucosa moist.  NECK:  Supple.  No lymphadenopathy.  No thyromegaly.  No jugular venous  distention.  CARDIAC:  Regular rate and rhythm.  LUNGS:  Clear to auscultation bilaterally.  No wheezes, rhonchi, or  rales.  ABDOMEN:  Soft, nontender, and nondistended.  Normoactive bowel sounds.  No masses are appreciated.  Her left lower extremity is in a range of motion machine for her knee.  The right lower extremity has TED hose and SCDs.  No evidence of  clubbing,  cyanosis, or edema.  She has palpable DP pulses on the left  leg.  Further examination is deferred.  NEUROLOGICAL:  She is alert and oriented x3.  Her cranial nerves II-XII  are intact grossly.  She is appropriate.  She has 5/5 strength in her  upper extremities.  Her lower extremity examination was deferred because  of the recent surgery.   DATA:  Her sodium was 140, potassium 4.2, chloride 106, bicarb 31,  glucose 203, BUN 19, creatinine 1.42, white count 7.0, hemoglobin 9.8,  hematocrit 29.8, platelet count 192.   Please refer to family history, social history, review of systems from  H&P dated yesterday, August 14, 2007.   CURRENT MEDICATIONS:  1. Celebrex 200 mg p.o. daily.  2. Colace 100 mg p.o. b.i.d.  3. Ferrous sulfate 325 mg p.o. t.i.d.  4. Dilaudid PCA pump.  5. Senokot 1 tablet p.o. b.i.d.  6. Maxzide 1 tablet p.o. daily.  7. Coumadin 5 mg daily for DVT prophylaxis.   Thank you very much for this consultation.  We will follow with you.      Thomasenia Bottoms, MD  Electronically Signed     CVC/MEDQ  D:   08/16/2007  T:  08/17/2007  Job:  [id number]   cc:   Franchot Heidelberg, M.D.

## 2011-03-02 NOTE — Op Note (Signed)
[name], [name]              ACCOUNT NO.:  [id number]   MEDICAL RECORD NO.:  [id number]          PATIENT TYPE:  INP   LOCATION:  A340                          FACILITY:  [facility]   PHYSICIAN:  Vickki Hearing, M.D.DATE OF BIRTH:  1934-02-22   DATE OF PROCEDURE:  08/15/2007  DATE OF DISCHARGE:                               OPERATIVE REPORT   This is a 77 year old female with end-stage osteoarthritis of the left  knee.  She failed conservative treatment had disabling pain and  dysfunction, presented for left total knee replacement.   PREOPERATIVE DIAGNOSIS:  Osteoarthritis left knee.   POSTOPERATIVE DIAGNOSIS:  Osteoarthritis left knee.   PROCEDURE:  Left total knee replacement with the Stryker posterior  stabilized Triathlon total knee system, #5 posterior stabilized femur,  #5 tibia, #5 11-mm polyethylene insert, size A 29 9-mm offset patella.   SURGEON:  Vickki Hearing, M.D. assisted by [facility] and Trenton Founds.   TOURNIQUET TIME:  1 hour 30 minutes.   OPERATIVE FINDINGS:  This was primarily a valgus knee with valgus  compartment __________ arthrosis, mild to moderate changes in the  patellofemoral areas and medial compartment.  Valgus deformity was  approximately 8 degrees.   This patient was identified as Melvyn Neth in the preop area.  Her  left knee was marked for surgery and countersigned by the surgeon.  Appropriate history and physical update was completed.  The patient was  taken to surgery for spinal anesthetic.  Spinal anesthetic was somewhat  difficult but was successfully done.  The patient was placed supine,  Foley catheter was placed.  The tourniquet was applied to her left  thigh.  The left lower leg was prepped and draped with DuraPrep and  sterile draping technique.  Time-out procedure was completed.  The limb  was exsanguinated with a 6-inch Esmarch.  The tourniquet was inflated.  The knee was placed in flexion.  A straight midline  incision was made  followed by medial arthrotomy, removal of the medial and lateral menisci  were performed as well as the ACL and PCL.  Osteophytes were resected as  well.  Patella was everted.   A 3/8 inch drill bit was used to enter the femoral canal, the canal was  decompressed with suction and irrigation.  Distal femoral cut was set  for 10 mm.  This cutting block was pinned in place, 10 mm resection was  performed.  Femur was then sized to a size 5, a 4 in 1 cutting block was  applied to the femur with approximately 5 degrees external rotation  using epicondylar axis for rotation.  With this block pinned in place,  four cuts were made.  Posterior osteophytes were removed.   The tibia was then prepared by subluxating it forward setting for  neutral cut with approximately 15-mm resection from the tibial spine.  After this cut was completed, flexion extension gaps were checked, 11 mm  spacer block fit well, 13 was too tight.   The box cut was made on the femur.  Peg holes were drilled.  Trial  reduction was performed,  rotation on the tibia was set, the patella was  resected from a 23 to a 14.  Three peg holes were made.  We then did a  trial reduction with 11 mm insert.  This gave excellent motion and full  extension.  Patella tracked laterally so a lateral release was done  until a no-touch technique allowed the patella to track normally.   We then punched the tibia, irrigated the knee, dried the bone surfaces,  prepared the cement, cemented the implants in place, allow the cement to  cure, remove the excess cement and then trialed with 11 mm insert.  Again with the lateral release patella tracked normally full extension  and flexion up to 125 degrees was obtained.   A 11-mm polyethylene insert, for size 5 tibia was placed and the wound  was irrigated and the quadriceps area was injected with Marcaine  epinephrine solution.  The knee was closed in flexion with #1 Bralon   interrupted and running suture.  We inserted a pain pump catheter in the  subcu space closed the subcu with 0 Monocryl, reapproximated skin edges  with staples, took an intraoperative x-ray.  The implants were placed in  excellent position.  The wound was dressed, cryo cuff was applied.  The  patient was taken to recovery room in stable condition.      Vickki Hearing, M.D.  Electronically Signed     SEH/MEDQ  D:  08/15/2007  T:  08/15/2007  Job:  [id number]

## 2011-03-02 NOTE — H&P (Signed)
[name], [name]              ACCOUNT NO.:  [id number]   MEDICAL RECORD NO.:  [id number]          PATIENT TYPE:  AMB   LOCATION:  DAY                           FACILITY:  [facility]   PHYSICIAN:  Vickki Hearing, M.D.DATE OF BIRTH:  Feb 12, 1934   DATE OF ADMISSION:  DATE OF DISCHARGE:  LH                              HISTORY & PHYSICAL   CHIEF COMPLAINT:  Left knee pain.   HISTORY:  This is a 77 year old female with end-stage arthritis and  disabling left knee pain.  She has been treated with Tylenol Arthritis  and injection.  She complains of weightbearing pain and loss of ability  to perform activities of daily living.  She is presenting for a left  total knee replacement.   MEDICATIONS:  She takes glucosamine, Oc-Cal, aspirin, Maxzide, Tylenol  Arthritis.   ALLERGIES:  No current drug allergies.   PAST MEDICAL HISTORY:  1. Hypertension.  2. Osteoarthritis.  3. Osteopenia.  4. She had a colonic polyp excised and removed.  5. She had a total abdominal hysterectomy for bleeding.   FAMILY HISTORY:  Father died of colon cancer.  Mother died of CVA and  cancer.  She has three siblings, two sisters, one brother, positive  history of hypertension.   SOCIAL HISTORY:  She is a retired Catering manager.   She is a former smoker, does not drink.   REVIEW OF SYSTEMS:  She had a mammogram in 2007, colonoscopy in 2008,  Pap smear in 2008.  She denied fever,  weight loss, vision loss,  decreased hearing, hoarseness, chest pain, syncope, dyspnea on exertion,  cough, hemoptysis, indigestion, heartburn, hematuria, incontinence,  muscle weakness, skin lesions, angioedema, breast mass, abnormal  bleeding.   PHYSICAL EXAMINATION:  VITAL SIGNS:  Unrecorded today, have been stable  in the past.  GENERAL:  Well-developed, well-nourished, in no acute distress.  HEENT:  Head normocephalic and atraumatic.  LUNGS:  Clear.  HEART:  Rate and rhythm normal.  ABDOMEN:  Soft.  GAIT:  She uses a cane  on the right; she limps favoring the left.  SKIN:  Healthy.  EXTREMITIES:  She has valgus deformity in the knee.  In the left knee,  she has tenderness in the medial and lateral joint lines parapatellar  regions with motion of 110 degrees.  Ligaments are stable, no swelling.  Right lower extremity within normal limits.  Upper extremities normal.   X-rays show valgus arthritis of the left knee.   PLAN:  Left total knee.   INFORMED CONSENT PROCESS:  An informed consent process was initiated and  completed in the office, and the consent form was signed.  Some but not  all of the things we discussed were the risks of the surgery, the  benefits of the surgery, alternative treatments which include but not  limited to bleeding, infection, DVT, blood clot, infection requiring two-  stage revision, stiffness, wearing, revision needed for a polyethylene  wear or breakdown, continued pain.  The patient wished to proceed  despite these risk factors.  She wished to attempt to gain the benefits  of the surgery which  include pain relief and improved function.      Vickki Hearing, M.D.  Electronically Signed     SEH/MEDQ  D:  08/14/2007  T:  08/14/2007  Job:  [id number]   cc:   Franchot Heidelberg, M.D.   [facility]  Fax: [phone]

## 2011-03-02 NOTE — Group Therapy Note (Signed)
[name], [name]              ACCOUNT NO.:  [id number]   MEDICAL RECORD NO.:  [id number]          PATIENT TYPE:  INP   LOCATION:  A340                          FACILITY:  [facility]   PHYSICIAN:  Dorris Singh, DO    DATE OF BIRTH:  01-04-1934   DATE OF PROCEDURE:  08/17/2007  DATE OF DISCHARGE:                                 PROGRESS NOTE   Patient seen today.  She was seen by Dr. Gasper Sells last night who DC'd  several of her medications.  She states that she actually feels better  today.  Her Maxzide was DC'd and some blood work was obtained, but the  patient says she is feeling much better today and also her pain  medication she mentioned was discontinued.   PHYSICAL EXAMINATION:  VITAL SIGNS:  Temperature 98.6, pulse 70,  respirations 18, blood pressure 139/69.  GENERAL:  This is a 77 year old African-American female who was resting  comfortably in bed in no acute distress.  HEENT:  Positive dentures, PERRLA, EOMI.  NECK:  Supple.  LUNGS:  Clear to auscultation bilaterally.  No rales, wheezes or  rhonchi.  HEART:  Regular rate and rhythm.  ABDOMEN:  Soft, nontender, nondistended.  EXTREMITIES:  Her left knee was elevated due to the total left knee  replacement.   LABORATORY DATA:  White count is 9.8, hemoglobin 9.8, hematocrit 28.8,  and platelet count 178.  Chemistry 140 sodium, potassium 4, chloride  105, CO2 30, glucose 146, BUN 21, and creatinine 1.46.  She had a  troponin done which was 0.5, creatinine kinase 373.   ASSESSMENT/PLAN:  Hypotension that we are managing her for.  She is  currently taken off of her medications.  We will go ahead and continue  to monitor.  If her blood pressure stabilizes then she can probably go  back home on her blood pressure medication if it continues to increase.   Thank you for this very kind consult as well for Dr. Romeo Apple.      Dorris Singh, DO  Electronically Signed     CB/MEDQ  D:  08/17/2007  T:  08/18/2007  Job:   [id number]

## 2011-03-02 NOTE — Op Note (Signed)
[name], [name]              ACCOUNT NO.:  [id number]   MEDICAL RECORD NO.:  [id number]          PATIENT TYPE:  AMB   LOCATION:  DAY                           FACILITY:  [facility]   PHYSICIAN:  Vickki Hearing, M.D.DATE OF BIRTH:  09-03-1934   DATE OF PROCEDURE:  DATE OF DISCHARGE:  06/27/2003                               OPERATIVE REPORT   CHIEF COMPLAINT:  Flexion contracture after total knee.   HISTORY:  A 77 year old female had a left total knee arthroplasty on  August 15, 2007.  Postop film shows full extension of the left knee.  In the postop period, developed flexion contracture.  She was treated  with increased physical therapy, extension splinting, did not improve,  presents for manipulation under anesthesia and casting.   PREOPERATIVE DIAGNOSIS:  Flexion contracture, left knee, 718.46 ICD-9-  CM.   POSTOPERATIVE DIAGNOSIS:  Flexion contracture, left knee, 718.46 ICD-9-  CM.   PROCEDURE:  Manipulation under anesthesia and application of long leg  cast left knee.   SURGEON:  Vickki Hearing, MD.   No assistants.   ANESTHETIC:  General by LMA.   FINDINGS:  A 12-degree flexion contracture, postop 0 degrees  contracture.   No specimens.   No blood loss.   No complications.   The patient to PACU in good condition.   PROCEDURE DETAILS:  After proper site marking and patient identification  and updating the history and physical, the patient was taken to surgery,  had general anesthetic, time-out procedure completed, and with gentle  pressure on the anterior portion of the femur and tibia, the knee was  manipulated from 12 degrees down to 0 degrees  and a long leg cast was applied with the knee in full extension.  The  patient was then extubated, taken to recovery room in good condition.  Postop plan is for full weightbearing as tolerated.  After 2 weeks, the  patient will be converted to a dropout cast where therapy can be done.      Vickki Hearing, M.D.  Electronically Signed     SEH/MEDQ  D:  03/08/2008  T:  03/09/2008  Job:  [id number]

## 2011-03-02 NOTE — H&P (Signed)
NAMEHYLA, [name]              ACCOUNT NO.:  [id number]   MEDICAL RECORD NO.:  [id number]          PATIENT TYPE:  AMB   LOCATION:  DAY                           FACILITY:  [facility]   PHYSICIAN:  Vickki Hearing, M.D.DATE OF BIRTH:  1934-08-01   DATE OF ADMISSION:  DATE OF DISCHARGE:  LH                              HISTORY & PHYSICAL   CHIEF COMPLAINT:  Flexion contracture after total knee.   HISTORY OF PRESENT ILLNESS:  This is a 77 year old female who had a left  total knee arthroplasty on August 15, 2007.  She did well except she  developed a flexion contracture, and despite several attempts with  physical therapy including extension splinting she still has residual  flexion contracture and has consented for manipulation under anesthesia  and casting.   MEDICATIONS:  1. Caltrate 600 Plus D.  2. Aspirin.  3. Lisinopril.  4. Hydrochlorothiazide combination 20-25 mg.  5. Tylenol Arthritis 650 mg.  6. Hydrocodone 7.5/325 mg.   ALLERGIES:  No known current allergies.   MEDICAL PROBLEMS:  Type 2 diabetes, osteopenia, hypertension, colonic  polyps.   PAST SURGICAL HISTORY:  Abdominal hysterectomy.   FAMILY HISTORY:  Father had colon cancer.  Mother had cancer and a CVA.  One brother has hypertension.  She also has two sisters. 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  She is a former smoker, does not  drink.  No drug use.  Smoking was stopped in 1998.   REVIEW OF SYSTEMS:  GENERAL:  Denies weight loss.  CARDIAC:  Denies  chest pain.  RESPIRATORY:  Denies shortness of breath.  GI/GU:  Denies  nausea, kidney failure.  NEUROLOGICAL:  Denies headache.  MUSCULOSKELETAL:  As stated.  ENDOCRINE:  Denies thyroid disease.  Does  have diabetes.  PSYCHIATRIC:  Denies depression.  DERMATOLOGICAL:  Denies eczema.  ENT:  Negative.  IMMUNOLOGICAL:  Negative.  LYMPHADENOPATHY:  Negative.   PHYSICAL EXAMINATION:  VITAL SIGNS:  Stable.  GENERAL:  Normal development,  nutrition, grooming, and hygiene, medium  body habitus.  CARDIOVASCULAR:  Observation and palpation were normal.  LYMPHADENOPATHY:  Lymph nodes were negative.  SKIN:  Inspection, palpation revealed no abnormalities.  NEUROLOGICAL:  Findings showed normal coordination, reflexes, and  sensation.  PSYCHIATRY:  She was awake, alert, and oriented x3 with normal mood and  affect.  MUSCULOSKELETAL:  Gait was normal with the exception of the flexed  posture of the left knee from the contracture.  Upper extremities have  normal appearance, range of motion, strength, and stability.   Left knee flexion is 115 but has -10 in the extension.  She has pain  when you force the extension.  There is extension deficit because of  this.   Knee is, otherwise, stable.   IMPRESSION:  Contracture of lower leg joint, 718.46.   PLAN:  Manipulation under anesthesia and casting in a long leg cast.  Surgery date 22nd of May in 2009.  Postop visit scheduled 26th 2009 in  May.      Vickki Hearing, M.D.  Electronically Signed     SEH/MEDQ  D:  03/06/2008  T:  03/06/2008  Job:  [id number]

## 2011-03-02 NOTE — Discharge Summary (Signed)
[name], [name]              ACCOUNT NO.:  [id number]   MEDICAL RECORD NO.:  [id number]          PATIENT TYPE:  INP   LOCATION:  A340                          FACILITY:  [facility]   PHYSICIAN:  Vickki Hearing, M.D.DATE OF BIRTH:  1934/04/23   DATE OF ADMISSION:  08/15/2007  DATE OF DISCHARGE:  10/31/2008LH                               DISCHARGE SUMMARY   ADMITTING DIAGNOSIS:  Osteoarthritis, left knee.   DISCHARGE DIAGNOSIS:  Osteoarthritis, left knee.   PROCEDURE:  Left total knee replacement.   REPLACEMENT IMPLANT:  Stryker posterior-stabilized Triathlon total knee,  #5 posterior-stabilized femur, #5 tibia, #5 eleven-millimeter  polyethylene insert, size 29 nine-millimeter off-set patella.   ANESTHESIA:  Surgery done under spinal.   COMPLICATIONS:  No complications.  Postop films were normal.   HISTORY:  This is a 77 year old female with end-stage arthritis and  disabling left knee pain, who was treated with Tylenol Arthritis, but  continued to have weightbearing pain, loss of ability to perform  activities of daily living and presented for a left total knee  replacement.  She has a history of hypertension, status post colonic  polyp excision and status post total abdominal hysterectomy.   HOSPITAL COURSE:  After admission, she underwent uncomplicated left  total knee replacement, tolerated that well, was placed in a CPM machine  and had no real difficulties, other than with her therapy.  She could  not walk well.  She was placed in a CPM and tolerated that well.  She is  a good rehab candidate.  She will be discharged to [facility].   Her laboratory results show a hemoglobin of 9, normal Chem-7, BUN and  creatinine 20 and 1.39; her preop creatinine was 1.32.  Preop hemoglobin  was 13.5.  Today's INR is 1.6; we would like to get that up to 2.   INSTRUCTIONS FOR THERAPY:  Daily therapy, range of motion as tolerated,  CPM 0-90, increase 10 degrees as tolerated, full  weightbearing.  Use  knee immobilizer while in the bed at night.  Cryo/Cuff as needed for  swelling.   DISCHARGE MEDICATIONS:  1. Celebrex 200 mg daily for October 31 and November 1 and November 2,      then stop.  2. Colace 100 mg twice a day.  3. Iron 325 mg three times a day.  4. Senokot one tab b.i.d. a.c.  5. Coumadin 5 mg daily to maintain an INR of 2 to 2.5; stop date for      Coumadin is November 7.  The patient should wear TED hose for 6      weeks, both legs, knee high only.  6. Vicodin 1 tablet q.4 h. p.r.n. for pain.   NOTE:  This patient was on Maxzide, had low blood pressure after surgery  and it was stopped.  If blood pressure becomes an issue, Maxzide should  be restarted.   FOLLOWUP VISIT:  The patient has a followup visit scheduled for 14 days  postop, which would be approximately November 11.  [facility] can come out  November 7.  Call [phone] for any problems with  the patient.      Vickki Hearing, M.D.  Electronically Signed     SEH/MEDQ  D:  08/18/2007  T:  08/18/2007  Job:  [id number]

## 2011-03-05 NOTE — H&P (Signed)
   NAME:  [name], [name]                        ACCOUNT NO.:  [id number]   MEDICAL RECORD NO.:  [id number]                   PATIENT TYPE:  AMB   LOCATION:  DAY                                  FACILITY:  [facility]   PHYSICIAN:  Oley Balm. Pricilla Holm, D.P.M.             DATE OF BIRTH:  Oct 04, 1934   DATE OF ADMISSION:  10/22/2002  DATE OF DISCHARGE:                                HISTORY & PHYSICAL   HISTORY OF PRESENT ILLNESS:  The patient is a 77 year old black female who  presented to the office with a chief complaint of painful bunion deformity  of the left foot.  The patient had a previous repair of the right bunion and  is now in to have the left bunion corrected.   PREVIOUS HOSPITALIZATIONS AND SURGERY:  Hysterectomy, previous bunion  surgery.  No transfusions or hepatitis.   MEDICATIONS:  Maxzide, vitamin E, aspirin and Os-Cal.   ALLERGIES:  She has no allergies.   REVIEW OF SYSTEMS:  Review of systems reveals history of hypertension.   PHYSICAL EXAMINATION:  EXTREMITIES:  Lower extremity exam reveals palpable  pedal pulses, both DP and PT, with spontaneous capillary filling time.  NEUROLOGIC:  Exam essentially within normal limits.  MUSCULOSKELETAL:  Exam reveals pain to palpation over the medial eminence of  the first metatarsal of the left foot.  There is noted hypertrophy of the  medial eminence of the metatarsal head of the left foot.   X-RAY FINDINGS:  X-rays taken revealed increased metatarsal angle with  increased hallux abductovalgus angle.   ASSESSMENT:  Hallux valgus deformity of left foot.   PLAN:  The patient will undergo ostial __________  .  The patient including  complications of the procedure such as infection, bone infection,  postoperative pain and swelling __________  .  The patient seems to  understand the same and planned surgery has been scheduled for October 22, 2002.                                                Oley Balm Pricilla Holm, D.P.M.    DBT/MEDQ  D:  10/21/2002  T:  10/22/2002  Job:  [id number]

## 2011-03-05 NOTE — Op Note (Signed)
NAME:  [name], [name]                        ACCOUNT NO.:  [id number]   MEDICAL RECORD NO.:  [id number]                   PATIENT TYPE:  AMB   LOCATION:  DAY                                  FACILITY:  [facility]   PHYSICIAN:  Oley Balm. Pricilla Holm, D.P.M.             DATE OF BIRTH:  1934/10/10   DATE OF PROCEDURE:  10/22/2002  DATE OF DISCHARGE:                                 OPERATIVE REPORT   PREOPERATIVE DIAGNOSIS:  Hallux valgus deformity, left foot.   POSTOPERATIVE DIAGNOSIS:  Hallux valgus deformity, left foot.   PROCEDURE:  Austin bunionectomy, left foot.   SURGEON:  Oley Balm. Pricilla Holm, D.P.M.   ANESTHESIA:  Local standby or monitored anesthesia care.   INDICATION FOR PROCEDURE:  A longstanding history of pain unrelieved by  conservative care.   DESCRIPTION OF PROCEDURE:  The patient was brought into the operating room  and placed on the operating table in the supine position.  The patient's  lower left foot and leg were then prepped and draped in the usual aseptic  manner.  Then with an ankle tourniquet in place and well-padded to prevent  contusion, elevated to 250 mmHg after exsanguination of the left foot, and  the following surgical procedure was then performed under local infiltration  of 2% Xylocaine and 0.5% Marcaine.   Austin bunionectomy, left foot:  Attention was directed to the dorsal medial  aspect of the first MTP, where a curvilinear incision was made.  The  incision widened and deepened via sharp and blunt dissection, making sure to  identify and retract all vital structures.  A linear capsular incision was  then made in the head of the metatarsal and __________ soft tissue  attachments dorsally and medially.  Utilizing a Zimmer oscillating saw, the  medial eminence on the medial aspect of the first metatarsal head was  resected.  Dissection was then carried down in the first web space, where a  lateral fibular sesamoid release was performed.  Attention was  then  redirected to the medial aspect of the first metatarsal, where an Eliberto Ivory-  type osteotomy was made with the apex distal, the base proximal, and capital  fragment slid laterally, impacted, and fixated with two 0.045 K-wires.  After fixation it was noted that the osteotomy site was stable.  The  remaining protruding aspect of the metatarsal head rasped smooth.  The wound  lavaged with copious amounts of sterile saline and the capsule,  subcutaneous tissue were brought together with a continuous suture of 4-0  Dexon, and the skin was approximated utilizing running subcuticular suture  of 4-0 Dexon.   All surgical sites were then infiltrated with approximately 1/8 cc of  dexamethasone phosphate and mild compressive bandages consisting of Betadine-  soaked Adaptic, sterile 4 x 4's,  and sterile Kling were then applied.  The  patient tolerated the procedure well and left the operating room in apparent  good condition  with vital signs stable to the recovery room.                                               Oley Balm Pricilla Holm, D.P.M.    DBT/MEDQ  D:  10/22/2002  T:  10/22/2002  Job:  [id number]

## 2011-03-05 NOTE — Op Note (Signed)
   NAME:  [name], [name]                        ACCOUNT NO.:  [id number]   MEDICAL RECORD NO.:  [id number]                   PATIENT TYPE:  AMB   LOCATION:  DAY                                  FACILITY:  [facility]   PHYSICIAN:  Oley Balm. Pricilla Holm, D.P.M.             DATE OF BIRTH:  14-Jan-1934   DATE OF PROCEDURE:  07/01/2003  DATE OF DISCHARGE:                                 OPERATIVE REPORT   PREOPERATIVE DIAGNOSIS:  Symptomatic internal fixation device x2 left foot.   POSTOPERATIVE DIAGNOSES:  Symptomatic internal fixation device x2 left foot.   PROCEDURE:  Removal of pins x2 left foot.   SURGEON:  Oley Balm. Pricilla Holm, D.P.M.   ANESTHESIA:  Local   INDICATIONS FOR SURGERY:  Long-standing history of pain.   DESCRIPTION OF PROCEDURE:  The patient brought to the operating room and  placed on the operating table in the supine position  The patient's lower  left foot and leg was then prepped and draped in the usual aseptic manner.  Then, with an ankle tourniquet placed and well-padded to prevent contusion;  elevated to 250 mmHg.  After exsanguination of the left foot the following  surgical procedures were then performed under local anesthesia. I locally  infiltrated with 2% Xylocaine and 0.05% Marcaine.   EXCISION AND REMOVAL OF PINS FIRST METATARSAL LEFT FOOT.  Attention was  directed to the dorsal aspect of the left foot at the level of the first  metatarsal where a linear incision made.  The incision was widened and  deepened via sharp and blunt dissection being sure to identify and retract  all vital structures. A capsular incision was made. The pin was isolated,  identified and removed.  The wound was lavaged with copious amounts of  sterile saline in the capsule and subcutaneous tissues, approximated with 10  sutures of 4-0 Dexon.  The skin was approximated utilizing, horizontal  mattress sutures of 0 Prolene.   REMOVAL OF SECOND PIN FIRST METATARSAL.  The same procedure as  described on  the first pin was performed on the second pin with no addition or deletions.   All surgical sites were then infiltrated with approximately 1/8 cc of  dexamethasone phosphate, mild compressive bandage consisting of Betadine  soaked Adaptic, sterile 4 x 4's, and sterile Kling.  The patient tolerated  the procedure well and left the operating room in apparent good condition  and taken to the recovery room.      DBT/MEDQ  D:  07/01/2003  T:  07/01/2003  Job:  [id number]

## 2011-03-05 NOTE — H&P (Signed)
   NAMEANJULI, [name]                          ACCOUNT NO.:  [id number]   MEDICAL RECORD NO.:  [id number]                  PATIENT TYPE:   LOCATION:                                       FACILITY:   PHYSICIAN:  Oley Balm. Pricilla Holm, D.P.M.             DATE OF BIRTH:   DATE OF ADMISSION:  06/28/2003  DATE OF DISCHARGE:                                HISTORY & PHYSICAL   For surgery of left foot removal of pins.   [name] is a 77 year old black female who presented to our office last  year with a painful bunion deformity of the left foot.  She underwent  correction of her hallux valgus deformity and has progressed well.  However,  she has had symptomatic pin that has developed in her left foot.  She would  like to have the pin removed surgically.   PREVIOUS HOSPITALIZATIONS, SURGERY:  1. Previous bunionectomy.  2. Hysterectomy.   MEDICATIONS:  Maxzide, vitamin E, aspirin, Os-Cal.   No allergies.  No transfusions, hepatitis.   REVIEW OF SYSTEMS:  History of hypertension.  Lower extremity exam reveals  palpable pedal pulses both DP and PT with spontaneous capillary filling  time.  Neurologic exam essentially within normal limits.  Musculoskeletal  reveals pain to palpation over the first metatarsal left foot.   X-rays reveal the pin is kind of backing out.   ASSESSMENT:  Symptomatic internal fixation device left foot.   PLAN:  The patient will undergo surgical removal of same.  I reviewed the  procedure with the patient including complications of procedure such as  infection, bone infection, postoperative pain, swelling, etc.  The patient  seems to understand the same and, again, surgery has been scheduled for  June 28, 2003.                                               Oley Balm Pricilla Holm, D.P.M.    DBT/MEDQ  D:  06/28/2003  T:  06/28/2003  Job:  [id number]

## 2011-03-05 NOTE — Op Note (Signed)
[name], DUTTA              ACCOUNT NO.:  [id number]   MEDICAL RECORD NO.:  [id number]          PATIENT TYPE:  AMB   LOCATION:  DAY                           FACILITY:  [facility]   PHYSICIAN:  Kassie Mends, M.D.      DATE OF BIRTH:  03/24/1934   DATE OF PROCEDURE:  12/19/2006  DATE OF DISCHARGE:                               OPERATIVE REPORT   REFERRING PHYSICIAN:  Franchot Heidelberg, M.D.   PROCEDURE PERFORMED:  Colonoscopy.   INDICATIONS FOR PROCEDURE:  Ms. Kovacik is a 77 year old whose father had  colon cancer diagnosed in his 71s.  She presents for colon cancer  screening.  She had a colonoscopy in 2002 which showed diverticulosis.  The report is reviewed on today.   FINDINGS:  1. Left-sided diverticulosis, otherwise no polyps, masses,      inflammatory changes or arteriovenous malformations seen.  2. Normal retroflexed view of the rectum.   RECOMMENDATIONS:  1. High fiber diet.  Ms. Seres is given a handout on high fiber diet      and diverticulosis.  2. Screening colonoscopy in 10 years because Ms. Slinker's first degree      relative was diagnosed with colon cancer at age greater than 64.  3. Follow-up with Franchot Heidelberg, M.D.   MEDICATIONS:  1. Demerol 75 mg IV.  2. Versed 6 mg IV.   DESCRIPTION OF PROCEDURE:  Physical exam was performed and informed  consent was obtained from the patient after explaining the risks,  benefits and alternatives to the procedure.  The patient was connected  to the monitor and placed in the left lateral position.  Continuous  oxygen was provided by nasal cannula and IV medicine administered  through an indwelling cannula.  After administration of sedation and  rectal exam, the patient's rectum  was intubated and scope was advanced under direct visualization to the  cecum.  The scope was subsequently removed slowly by carefully examining  the color, texture, anatomy, integrity of the mucosa on the way out.  The patient was  recovered in the endoscopy suite and discharged to home  in satisfactory condition.      Kassie Mends, M.D.  Electronically Signed     SM/MEDQ  D:  12/19/2006  T:  12/19/2006  Job:  [id number]   cc:   Franchot Heidelberg, M.D.

## 2011-03-09 ENCOUNTER — Telehealth: Payer: Self-pay | Admitting: Family Medicine

## 2011-03-10 MED ORDER — LISINOPRIL-HYDROCHLOROTHIAZIDE 20-12.5 MG PO TABS: 1.0000 | ORAL_TABLET | Freq: Every day | ORAL | Status: DC

## 2011-03-10 NOTE — Telephone Encounter (Signed)
Med sent as requested 

## 2011-05-06 ENCOUNTER — Encounter: Payer: Self-pay | Admitting: Cardiovascular Disease

## 2011-06-02 LAB — CBC WITH DIFFERENTIAL/PLATELET
Basophils Relative: 1 % (ref 0–1)
HCT: 39.8 % (ref 36.0–46.0)
Hemoglobin: 12.6 g/dL (ref 12.0–15.0)
Lymphocytes Relative: 42 % (ref 12–46)
Lymphs Abs: 1.7 10*3/uL (ref 0.7–4.0)
MCHC: 31.7 g/dL (ref 30.0–36.0)
Monocytes Absolute: 0.3 10*3/uL (ref 0.1–1.0)
Monocytes Relative: 8 % (ref 3–12)
Neutro Abs: 1.9 10*3/uL (ref 1.7–7.7)
Neutrophils Relative %: 46 % (ref 43–77)
RBC: 3.99 MIL/uL (ref 3.87–5.11)
WBC: 4.1 10*3/uL (ref 4.0–10.5)

## 2011-06-02 LAB — BASIC METABOLIC PANEL
Calcium: 9.6 mg/dL (ref 8.4–10.5)
Chloride: 108 mEq/L (ref 96–112)
Creat: 1.17 mg/dL — ABNORMAL HIGH (ref 0.50–1.10)
Glucose, Bld: 84 mg/dL (ref 70–99)
Potassium: 4.1 mEq/L (ref 3.5–5.3)

## 2011-06-02 LAB — MICROALBUMIN / CREATININE URINE RATIO: Microalb Creat Ratio: 3.7 mg/g (ref 0.0–30.0)

## 2011-06-02 LAB — HEMOGLOBIN A1C: Mean Plasma Glucose: 146 mg/dL — ABNORMAL HIGH (ref <117)

## 2011-06-02 LAB — LIPID PANEL
Cholesterol: 157 mg/dL (ref 0–200)
HDL: 68 mg/dL (ref >39)
LDL Cholesterol: 81 mg/dL (ref 0–99)
Total CHOL/HDL Ratio: 2.3 Ratio
Triglycerides: 41 mg/dL (ref <150)
VLDL: 8 mg/dL (ref 0–40)

## 2011-06-03 ENCOUNTER — Encounter: Payer: Self-pay | Admitting: Family Medicine

## 2011-06-04 ENCOUNTER — Encounter: Payer: Self-pay | Admitting: Family Medicine

## 2011-06-04 ENCOUNTER — Ambulatory Visit (INDEPENDENT_AMBULATORY_CARE_PROVIDER_SITE_OTHER): Payer: Medicare Other | Admitting: Family Medicine

## 2011-06-04 VITALS — BP 110/70 | HR 82 | Ht 65.0 in | Wt 181.0 lb

## 2011-06-04 DIAGNOSIS — Z1211 Encounter for screening for malignant neoplasm of colon: Secondary | ICD-10-CM

## 2011-06-04 DIAGNOSIS — M1710 Unilateral primary osteoarthritis, unspecified knee: Secondary | ICD-10-CM

## 2011-06-04 DIAGNOSIS — I10 Essential (primary) hypertension: Secondary | ICD-10-CM

## 2011-06-04 DIAGNOSIS — E119 Type 2 diabetes mellitus without complications: Secondary | ICD-10-CM

## 2011-06-04 DIAGNOSIS — Z86010 Personal history of colonic polyps: Secondary | ICD-10-CM

## 2011-06-04 NOTE — Progress Notes (Signed)
  Subjective:    Patient ID: [name], female    DOB: December 12, 1933, 77 y.o.   MRN: [id number]  HPI HYPERTENSION  Disease Monitoring  Blood pressure range-unknown Chest pain- no      Dyspnea- no  Compliance- good Lightheadedness- no   Edema- no  DIABETES  Disease Monitoring  Blood Sugar ranges-90 to 100 Polyuria- no New Visual problems- no  Compliance- good Hypoglycemic symptoms- no  Last eye exam: due Fall 2012      Podiatry visit: not regularly  HYPERLIPIDEMIA  Compliance- n/a RUQ pain- no  Muscle aches- no   REGULAR EXERCISE-keeps active but severely limited by osteoarthritis      Review of Systems See HPI Denies recent fever or chills. Denies sinus pressure, nasal congestion, ear pain or sore throat. Denies chest congestion, productive cough or wheezing. Denies chest pains, palpitations and leg swelling Denies abdominal pain, nausea, vomiting,diarrhea or constipation.   Denies dysuria, frequency, hesitancy or incontinence. Markedly decreased ROM of the knees due to severe arthritis. Denies headaches, seizures, numbness, or tingling. Denies depression, anxiety or insomnia. Denies skin break down , concerned about chronic swelling with hyperpigmentation of left leg, vascualr/cardiology keeping an eye on it        Objective:   Physical Exam Patient alert and oriented and in no cardiopulmonary distress.  HEENT: No facial asymmetry, EOMI, no sinus tenderness,  oropharynx pink and moist.  Neck adequate ROM no adenopathy.  Chest: Clear to auscultation bilaterally.  CVS: S1, S2 no murmurs, no S3.  ABD: Soft non tender. Bowel sounds normal. Rectal: no mass, guaic negative stool  Ext: No edema  WU:[id number]  ROM spine, shoulders, hips and knees.  Skin: Intact, no ulcerations or rash noted.  Psych: Good eye contact, normal affect. Memory intact not anxious or depressed appearing.  CNS: CN 2-12 intact, power, tone and sensation normal  throughout.        Assessment & Plan:

## 2011-06-04 NOTE — Patient Instructions (Signed)
F/u in 4 months.  Congrats , keep doing as well as you are.  Pls call end September or early October for flu shot.  No med changes.  Non fasting labs just before visit  Call if you need to see me before

## 2011-06-05 NOTE — Assessment & Plan Note (Signed)
Pt continues to ambulate with a cane, no falls

## 2011-06-05 NOTE — Assessment & Plan Note (Signed)
Controlled, no change in medication  

## 2011-06-05 NOTE — Assessment & Plan Note (Signed)
Stool test is negative, no rectal mass

## 2011-06-07 ENCOUNTER — Other Ambulatory Visit: Payer: Self-pay | Admitting: Family Medicine

## 2011-06-07 DIAGNOSIS — Z139 Encounter for screening, unspecified: Secondary | ICD-10-CM

## 2011-06-08 ENCOUNTER — Ambulatory Visit (INDEPENDENT_AMBULATORY_CARE_PROVIDER_SITE_OTHER): Payer: Medicare Other | Admitting: Cardiovascular Disease

## 2011-06-08 ENCOUNTER — Encounter: Payer: Self-pay | Admitting: Cardiovascular Disease

## 2011-06-08 VITALS — BP 126/78 | HR 60 | Ht 66.0 in | Wt 134.0 lb

## 2011-06-08 DIAGNOSIS — I739 Peripheral vascular disease, unspecified: Secondary | ICD-10-CM | POA: Insufficient documentation

## 2011-06-08 DIAGNOSIS — I7389 Other specified peripheral vascular diseases: Secondary | ICD-10-CM

## 2011-06-08 DIAGNOSIS — I10 Essential (primary) hypertension: Secondary | ICD-10-CM

## 2011-06-08 NOTE — Assessment & Plan Note (Signed)
Stable. Will repeat ABI this week and see her back in one year. She will let us know if her clinical status changes.

## 2011-06-08 NOTE — Patient Instructions (Signed)
Your physician recommends that you schedule a follow-up appointment in: 1 year  Your physician has requested that you have an ankle brachial index (ABI). During this test an ultrasound and blood pressure cuff are used to evaluate the arteries that supply the arms and legs with blood. Allow thirty minutes for this exam. There are no restrictions or special instructions.   

## 2011-06-08 NOTE — Progress Notes (Signed)
History of Present Illness:77 yo AAF with history of HTN, DM and OA who is here today for PV follow up. I saw her as a new patient in 2011 PV evaluation. She told  me that she had no pain in the right or left leg. She underwent segmental pressure study in May 2011 which suggested left lower extremity occlusive disease. ABI was normal on the right and reduced mildly at 0.77 on the left. She has no claudication, rest pain or ulcerations. She has chronic edema in the left leg with signs of venous stasis. She had her left knee replaced last year.   Non-invasive studies Novermber 2011 with ABI on right of  1.1 and on the left is 0.91. There were no focal lesions seen. Pressures and waveforms on the left suggest mild tibial disease. She is here for follow up. She has no new complaints. She has no pain in either leg with walking or at rest. Her right knee aches at rest. No calf pain.  No chest pain or SOB. She is overall doing well. She has been working in her garden this summer.   Past Medical History  Diagnosis Date  . Osteopenia   . Arthritis of knee     bilateral    . Diabetes mellitus, type 2   . Hypertension   . Personal history of colonic polyps 3 years ago     colonoscopy   . Chronic renal insufficiency     Past Surgical History  Procedure Date  . Total abdominal hysterectomy     bleeding   . Left knee replacement 08/14/08    Dr. Romeo Apple   . Left knee manipulation  under anasthesia due to contracture - 03/08/08    Dr. Romeo Apple   . Bunionectomy bilateral  feet   . Wisdom tooth extraction   . Dental surgery   . Gum disease 2010    treated by Dr. Pernell Dupre     Current Outpatient Prescriptions  Medication Sig Dispense Refill  . aspirin (ASPIRIN LOW DOSE) 81 MG EC tablet Take 81 mg by mouth daily.        . Calcium Carbonate-Vitamin D (CALTRATE 600+D) 600-400 MG-UNIT per tablet Take 1 tablet by mouth 2 (two) times daily.        . Flaxseed MISC by Does not apply route. Two tablespoons daily           . glucose blood (ACCU-CHEK AVIVA) test strip 1 each by Other route daily. Use as instructed       . Lancets (ACCU-CHEK MULTICLIX) lancets 1 each by Other route as needed. Use as instructed       . lisinopril-hydrochlorothiazide (PRINZIDE,ZESTORETIC) 20-12.5 MG per tablet Take 1 tablet by mouth daily.  30 tablet  3  . metformin (FORTAMET) 500 MG (OSM) 24 hr tablet Take 500 mg by mouth daily with breakfast.        . multivitamin (THERAGRAN) per tablet Take 1 tablet by mouth daily.          No Known Allergies  History   Social History  . Marital Status: Widowed    Spouse Name: N/A    Number of Children: 3  . Years of Education: college    Occupational History  . retired Building surveyor     Social History Main Topics  . Smoking status: Former Games developer  . Smokeless tobacco: Not on file   Comment: smoked for 15 years-not smoked since 1998  . Alcohol Use: No  . Drug  Use: No  . Sexually Active: Not on file   Other Topics Concern  . Not on file   Social History Narrative  . No narrative on file    Family History  Problem Relation Age of Onset  . Colon cancer Father   . Cancer Mother   . Stroke Mother   . Hypertension Brother     Review of Systems:  As stated in the HPI and otherwise negative.   There were no vitals taken for this visit.  Physical Examination: General: Well developed, well nourished, NAD HEENT: OP clear, mucus membranes moist SKIN: warm, dry. No rashes. Neuro: No focal deficits Musculoskeletal: Muscle strength 5/5 all ext Psychiatric: Mood and affect normal Neck: No JVD, no carotid bruits, no thyromegaly, no lymphadenopathy. Lungs:Clear bilaterally, no wheezes, rhonci, crackles Cardiovascular: Regular rate and rhythm. No murmurs, gallops or rubs. Abdomen:Soft. Bowel sounds present. Non-tender.  Extremities: Left lower ext edema. Pulses are hared to palpate  in the bilateral DP/PT.  EKG: NSR, rate 60 bpm. LAD, Non-specific T wave abnormality.

## 2011-06-24 ENCOUNTER — Encounter (INDEPENDENT_AMBULATORY_CARE_PROVIDER_SITE_OTHER): Payer: Medicare Other | Admitting: *Deleted

## 2011-06-24 DIAGNOSIS — I7389 Other specified peripheral vascular diseases: Secondary | ICD-10-CM

## 2011-06-24 DIAGNOSIS — I739 Peripheral vascular disease, unspecified: Secondary | ICD-10-CM

## 2011-07-05 ENCOUNTER — Ambulatory Visit (HOSPITAL_COMMUNITY)
Admission: RE | Admit: 2011-07-05 | Discharge: 2011-07-05 | Disposition: A | Discharge status: Home / Self Care | Payer: Medicare Other | Source: Ambulatory Visit | Attending: Family Medicine | Admitting: Family Medicine

## 2011-07-05 DIAGNOSIS — Z139 Encounter for screening, unspecified: Secondary | ICD-10-CM

## 2011-07-05 DIAGNOSIS — Z1231 Encounter for screening mammogram for malignant neoplasm of breast: Secondary | ICD-10-CM | POA: Insufficient documentation

## 2011-07-14 LAB — BASIC METABOLIC PANEL
CO2: 29
Calcium: 9.6
Chloride: 107
Creatinine, Ser: 1.14
Glucose, Bld: 77

## 2011-07-14 LAB — CBC
MCHC: 34.7
MCV: 92.8
RDW: 15.2

## 2011-07-26 LAB — URINALYSIS, ROUTINE W REFLEX MICROSCOPIC
Hgb urine dipstick: NEGATIVE
Ketones, ur: NEGATIVE
Protein, ur: NEGATIVE
Urobilinogen, UA: 0.2

## 2011-07-28 LAB — PROTIME-INR
INR: 0.9
INR: 1.6 — ABNORMAL HIGH
Prothrombin Time: 12.9
Prothrombin Time: 19.4 — ABNORMAL HIGH
Prothrombin Time: 19.8 — ABNORMAL HIGH

## 2011-07-28 LAB — BASIC METABOLIC PANEL
BUN: 19
BUN: 20
BUN: 21
BUN: 23
CO2: 30
CO2: 31
CO2: 32
Calcium: 8.6
Calcium: 9.4
Chloride: 101
Chloride: 106
Creatinine, Ser: 1.32 — ABNORMAL HIGH
GFR calc Af Amer: 45 — ABNORMAL LOW
GFR calc Af Amer: 48 — ABNORMAL LOW
GFR calc non Af Amer: 37 — ABNORMAL LOW
GFR calc non Af Amer: 37 — ABNORMAL LOW
Glucose, Bld: 146 — ABNORMAL HIGH
Glucose, Bld: 203 — ABNORMAL HIGH
Potassium: 4
Potassium: 4
Potassium: 4.2
Sodium: 140
Sodium: 140
Sodium: 140

## 2011-07-28 LAB — CBC
HCT: 26.4 — ABNORMAL LOW
HCT: 28.8 — ABNORMAL LOW
HCT: 29.2 — ABNORMAL LOW
Hemoglobin: 9 — ABNORMAL LOW
Hemoglobin: 9.8 — ABNORMAL LOW
Hemoglobin: 9.8 — ABNORMAL LOW
MCHC: 33.3
MCHC: 34.1
MCV: 93.3
MCV: 93.7
Platelets: 178
Platelets: 185
Platelets: 192
Platelets: 265
RBC: 2.84 — ABNORMAL LOW
RBC: 4.33
RDW: 13.4
RDW: 13.7
RDW: 13.7
WBC: 8.1

## 2011-07-28 LAB — DIFFERENTIAL
Basophils Absolute: 0
Basophils Relative: 0
Eosinophils Absolute: 0.1
Eosinophils Absolute: 0.1
Eosinophils Relative: 1
Eosinophils Relative: 1
Eosinophils Relative: 2
Lymphocytes Relative: 16
Lymphocytes Relative: 18
Lymphs Abs: 1.4
Monocytes Absolute: 0.9 — ABNORMAL HIGH
Monocytes Absolute: 1 — ABNORMAL HIGH
Monocytes Relative: 11

## 2011-07-28 LAB — CROSSMATCH: Antibody Screen: NEGATIVE

## 2011-07-29 ENCOUNTER — Ambulatory Visit (INDEPENDENT_AMBULATORY_CARE_PROVIDER_SITE_OTHER): Payer: Medicare Other

## 2011-07-29 DIAGNOSIS — Z23 Encounter for immunization: Secondary | ICD-10-CM

## 2011-09-21 ENCOUNTER — Other Ambulatory Visit: Payer: Self-pay | Admitting: Family Medicine

## 2011-10-02 LAB — HEMOGLOBIN A1C: Hgb A1c MFr Bld: 6.1 % — ABNORMAL HIGH (ref <5.7)

## 2011-10-02 LAB — BASIC METABOLIC PANEL
BUN: 34 mg/dL — ABNORMAL HIGH (ref 6–23)
CO2: 27 mEq/L (ref 19–32)
Chloride: 105 mEq/L (ref 96–112)
Creat: 1.26 mg/dL — ABNORMAL HIGH (ref 0.50–1.10)

## 2011-10-04 ENCOUNTER — Encounter: Payer: Self-pay | Admitting: Family Medicine

## 2011-10-06 ENCOUNTER — Ambulatory Visit (INDEPENDENT_AMBULATORY_CARE_PROVIDER_SITE_OTHER): Payer: Medicare Other | Admitting: Family Medicine

## 2011-10-06 VITALS — BP 120/76 | HR 80 | Resp 16 | Ht 66.0 in | Wt 183.0 lb

## 2011-10-06 DIAGNOSIS — R7303 Prediabetes: Secondary | ICD-10-CM

## 2011-10-06 DIAGNOSIS — M1990 Unspecified osteoarthritis, unspecified site: Secondary | ICD-10-CM

## 2011-10-06 DIAGNOSIS — I739 Peripheral vascular disease, unspecified: Secondary | ICD-10-CM

## 2011-10-06 DIAGNOSIS — E119 Type 2 diabetes mellitus without complications: Secondary | ICD-10-CM

## 2011-10-06 DIAGNOSIS — I10 Essential (primary) hypertension: Secondary | ICD-10-CM

## 2011-10-06 DIAGNOSIS — R7309 Other abnormal glucose: Secondary | ICD-10-CM

## 2011-10-06 DIAGNOSIS — M1710 Unilateral primary osteoarthritis, unspecified knee: Secondary | ICD-10-CM

## 2011-10-06 NOTE — Patient Instructions (Addendum)
F/U in end April Please ensure you drink an average of 60 ounces of water daily  hBA1c and chem 7 in April  Stop the metformin starting January 2, I do not think you need this anymore  Continue to check blood sugars , if fasting sugars are often over 120 please call.  Continue diabetic diet which is healthy  Keep well and call if you need me before

## 2011-10-07 ENCOUNTER — Encounter: Payer: Self-pay | Admitting: Family Medicine

## 2011-10-07 NOTE — Assessment & Plan Note (Signed)
Stable no falls, uses a cane

## 2011-10-07 NOTE — Assessment & Plan Note (Signed)
Evaluated approx 4 months ago by vascular surgery, no intervention needed

## 2011-10-07 NOTE — Assessment & Plan Note (Signed)
Controlled, no change in medication  

## 2011-10-07 NOTE — Assessment & Plan Note (Signed)
Pt's sugar is normal with minimal medication , she is advised to d/c metformin and rely on diet only

## 2011-10-07 NOTE — Progress Notes (Signed)
Subjective:     Patient ID: [name], female   DOB: June 18, 1934, 77 y.o.   MRN: [id number]  HPI The PT is here for follow up and re-evaluation of chronic medical conditions, medication management and review of any available recent lab and radiology data.  Preventive health is updated, specifically  Cancer screening and Immunization.   Questions or concerns regarding consultations or procedures which the PT has had in the interim are  addressed. The PT denies any adverse reactions to current medications since the last visit.  There are no new concerns.  There are no specific complaints  Blood sugars fasting range between 90 to 110     Review of Systems Denies recent fever or chills. Denies sinus pressure, nasal congestion, ear pain or sore throat. Denies chest congestion, productive cough or wheezing. Denies chest pains, palpitations and leg swelling Denies abdominal pain, nausea, vomiting,diarrhea or constipation.   Denies dysuria, frequency, hesitancy or incontinence. Denies joint pain, swelling , however has  limitation in mobility walks with a stick Denies headaches, seizures, numbness, or tingling. Denies depression, anxiety or insomnia. Denies skin break down or rash.        Objective:   Physical Exam Patient alert and oriented and in no cardiopulmonary distress.  HEENT: No facial asymmetry, EOMI, no sinus tenderness,  oropharynx pink and moist.  Neck supple no adenopathy.  Chest: Clear to auscultation bilaterally.  CVS: S1, S2 no murmurs, no S3.  ABD: Soft non tender. Bowel sounds normal.  Ext: No edema  WU:[id number]   ROM spine, shoulders, hips and knees.  Skin: Intact, no ulcerations or rash noted.  Psych: Good eye contact, normal affect. Memory intact not anxious or depressed appearing.  CNS: CN 2-12 intact, power, tone and sensation normal throughout.     Assessment:        Plan:

## 2012-02-01 LAB — COMPREHENSIVE METABOLIC PANEL
AST: 22 U/L (ref 0–37)
Albumin: 3.6 g/dL (ref 3.5–5.2)
BUN: 26 mg/dL — ABNORMAL HIGH (ref 6–23)
CO2: 29 mEq/L (ref 19–32)
Calcium: 9.2 mg/dL (ref 8.4–10.5)
Chloride: 107 mEq/L (ref 96–112)
Creat: 1.01 mg/dL (ref 0.50–1.10)
Glucose, Bld: 87 mg/dL (ref 70–99)
Potassium: 4.5 mEq/L (ref 3.5–5.3)

## 2012-02-03 ENCOUNTER — Ambulatory Visit (INDEPENDENT_AMBULATORY_CARE_PROVIDER_SITE_OTHER): Payer: Medicare Other | Admitting: Family Medicine

## 2012-02-03 ENCOUNTER — Encounter: Payer: Self-pay | Admitting: Family Medicine

## 2012-02-03 VITALS — BP 130/82 | HR 98 | Resp 18 | Ht 66.0 in | Wt 195.0 lb

## 2012-02-03 DIAGNOSIS — R5381 Other malaise: Secondary | ICD-10-CM

## 2012-02-03 DIAGNOSIS — M1990 Unspecified osteoarthritis, unspecified site: Secondary | ICD-10-CM

## 2012-02-03 DIAGNOSIS — I10 Essential (primary) hypertension: Secondary | ICD-10-CM

## 2012-02-03 DIAGNOSIS — E119 Type 2 diabetes mellitus without complications: Secondary | ICD-10-CM

## 2012-02-03 DIAGNOSIS — R5383 Other fatigue: Secondary | ICD-10-CM

## 2012-02-03 NOTE — Progress Notes (Signed)
  Subjective:    Patient ID: [name], female    DOB: [birth year], 78 y.o.   MRN: [id number]  HPI The PT is here for follow up and re-evaluation of chronic medical conditions, medication management and review of any available recent lab and radiology data.  Preventive health is updated, specifically  Cancer screening and Immunization.   Questions or concerns regarding consultations or procedures which the PT has had in the interim are  addressed. The PT denies any adverse reactions to current medications since the last visit.  Pt had been doing very well up until the morning of the visit, when getting ready to come she attempted to wipe pollen off her car and fell, this is her first fall, She has mild redness of the left knee but no abrasion. Fell on her buttock, no specific c/o back pain. She continues to control her blood sugars very well with diet only, and denies polyuria, polydipsia, blurred vision      Review of Systems See HPI Denies recent fever or chills. Denies sinus pressure, nasal congestion, ear pain or sore throat. Denies chest congestion, productive cough or wheezing. Denies chest pains, palpitations and leg swelling Denies abdominal pain, nausea, vomiting,diarrhea or constipation.   Denies dysuria, frequency, hesitancy or incontinence.  Denies headaches, seizures, numbness, or tingling. Denies depression, anxiety or insomnia. Denies skin break down or rash.        Objective:   Physical Exam Patient alert and oriented and in no cardiopulmonary distress.  HEENT: No facial asymmetry, EOMI, no sinus tenderness,  oropharynx pink and moist.  Neck supple no adenopathy.  Chest: Clear to auscultation bilaterally.  CVS: S1, S2 no murmurs, no S3.  ABD: Soft non tender. Bowel sounds normal.  Ext: No edema  MS: decreased  ROM spine, shoulders, hips and knees.  Skin: Intact, no ulcerations or rash noted.Mild erythema over left knee, no abrasion   Psych: Good  eye contact, normal affect. Memory intact not anxious or depressed appearing.  CNS: CN 2-12 intact, power, tone and sensation normal throughout.        Assessment & Plan:

## 2012-02-03 NOTE — Patient Instructions (Signed)
F/u August 16 or after.  Fasting lipid, chem 7, HBA1C, TSH, CBC august 14 or after.   It is important that you exercise regularly at least 30 minutes 5 times a week. If you develop chest pain, have severe difficulty breathing, or feel very tired, stop exercising immediately and seek medical attention    Please work on losing weight.

## 2012-02-05 NOTE — Assessment & Plan Note (Signed)
Worsening, pt actually fell earlier today at home, when cleaning her car off. Life line advised for increased safety, and will be considered. She is accompanied by her daughter

## 2012-02-05 NOTE — Assessment & Plan Note (Signed)
Controlled, no change in medication  

## 2012-02-05 NOTE — Assessment & Plan Note (Signed)
Diet controlled only at this time, updated lab excellent

## 2012-05-17 ENCOUNTER — Other Ambulatory Visit: Payer: Self-pay | Admitting: Family Medicine

## 2012-05-31 LAB — CBC
MCH: 31.8 pg (ref 26.0–34.0)
MCHC: 35 g/dL (ref 30.0–36.0)
Platelets: 209 10*3/uL (ref 150–400)
RBC: 4.22 MIL/uL (ref 3.87–5.11)

## 2012-06-01 LAB — TSH: TSH: 1.124 u[IU]/mL (ref 0.350–4.500)

## 2012-06-01 LAB — LIPID PANEL: Total CHOL/HDL Ratio: 2.4 Ratio

## 2012-06-01 LAB — COMPREHENSIVE METABOLIC PANEL
ALT: 24 U/L (ref 0–35)
AST: 30 U/L (ref 0–37)
CO2: 30 mEq/L (ref 19–32)
Calcium: 9.3 mg/dL (ref 8.4–10.5)
Chloride: 106 mEq/L (ref 96–112)
Creat: 1.2 mg/dL — ABNORMAL HIGH (ref 0.50–1.10)
Potassium: 4.4 mEq/L (ref 3.5–5.3)
Sodium: 143 mEq/L (ref 135–145)
Total Protein: 6.4 g/dL (ref 6.0–8.3)

## 2012-06-01 LAB — HEMOGLOBIN A1C
Hgb A1c MFr Bld: 6.2 % — ABNORMAL HIGH (ref <5.7)
Mean Plasma Glucose: 131 mg/dL — ABNORMAL HIGH (ref <117)

## 2012-06-02 ENCOUNTER — Encounter: Payer: Self-pay | Admitting: Family Medicine

## 2012-06-02 ENCOUNTER — Ambulatory Visit (INDEPENDENT_AMBULATORY_CARE_PROVIDER_SITE_OTHER): Payer: Medicare Other | Admitting: Family Medicine

## 2012-06-02 VITALS — BP 128/82 | HR 69 | Resp 16 | Ht 66.0 in | Wt 192.1 lb

## 2012-06-02 DIAGNOSIS — I10 Essential (primary) hypertension: Secondary | ICD-10-CM

## 2012-06-02 DIAGNOSIS — E119 Type 2 diabetes mellitus without complications: Secondary | ICD-10-CM

## 2012-06-02 DIAGNOSIS — Z13820 Encounter for screening for osteoporosis: Secondary | ICD-10-CM

## 2012-06-02 DIAGNOSIS — M899 Disorder of bone, unspecified: Secondary | ICD-10-CM

## 2012-06-02 DIAGNOSIS — M1990 Unspecified osteoarthritis, unspecified site: Secondary | ICD-10-CM

## 2012-06-02 DIAGNOSIS — I739 Peripheral vascular disease, unspecified: Secondary | ICD-10-CM

## 2012-06-02 DIAGNOSIS — Z1211 Encounter for screening for malignant neoplasm of colon: Secondary | ICD-10-CM

## 2012-06-02 DIAGNOSIS — M949 Disorder of cartilage, unspecified: Secondary | ICD-10-CM

## 2012-06-02 LAB — POC HEMOCCULT BLD/STL (OFFICE/1-CARD/DIAGNOSTIC): Fecal Occult Blood, POC: NEGATIVE

## 2012-06-02 NOTE — Patient Instructions (Signed)
Annual wellness in 3.5 month  Call and come for flu vac  Your blood pressure is excellent , no med changes as are your labs  Please continue healthy eating and active lifestyle, be careful not to fall  You are being referred for bone density testing.  Mammogram is due next month please schedule

## 2012-06-02 NOTE — Assessment & Plan Note (Signed)
Has upcoming appt with cvts

## 2012-06-02 NOTE — Assessment & Plan Note (Signed)
Severe arthritis in right knee, ambulates with a cane, no h/o near falls, conservative management with weight loss and physical activity at this  time per pt preference

## 2012-06-02 NOTE — Assessment & Plan Note (Signed)
dexa to be re evaluated, order entered

## 2012-06-02 NOTE — Assessment & Plan Note (Signed)
Controlled, no change in medication DASH diet and commitment to daily physical activity for a minimum of 30 minutes discussed and encouraged, as a part of hypertension management. The importance of attaining a healthy weight is also discussed.  

## 2012-06-02 NOTE — Addendum Note (Signed)
Addended by: Abner Greenspan on: 06/02/2012 03:10 PM   Modules accepted: Orders

## 2012-06-02 NOTE — Progress Notes (Signed)
  Subjective:    Patient ID: [name], female    DOB: 06/08/1934, 78 y.o.   MRN: [id number]  HPI The PT is here for follow up and re-evaluation of chronic medical conditions, medication management and review of any available recent lab and radiology data.  Preventive health is updated, specifically  Cancer screening and Immunization.   Questions or concerns regarding consultations or procedures which the PT has had in the interim are  addressed. The PT denies any adverse reactions to current medications since the last visit.  There are no new concerns.  There are no specific complaints      Review of Systems See HPI Denies recent fever or chills. Denies sinus pressure, nasal congestion, ear pain or sore throat. Denies chest congestion, productive cough or wheezing. Denies chest pains, palpitations and leg swelling Denies abdominal pain, nausea, vomiting,diarrhea or constipation.   Denies dysuria, frequency, hesitancy or incontinence. Chronic back and right knee pain  and limitation in mobility. Denies headaches, seizures, numbness, or tingling. Denies depression, anxiety or insomnia. Denies skin break down or rash.        Objective:   Physical Exam  Patient alert and oriented and in no cardiopulmonary distress.Ambulates with a cane  HEENT: No facial asymmetry, EOMI, no sinus tenderness,  oropharynx pink and moist.  Neck supple no adenopathy.  Chest: Clear to auscultation bilaterally.  CVS: S1, S2 no murmurs, no S3.  ABD: Soft non tender. Bowel sounds normal.  Ext: No edema  MS: decreased  ROM spine, shoulders, hips and knees.  Skin: Intact, no ulcerations or rash noted.  Psych: Good eye contact, normal affect. Memory intact not anxious or depressed appearing.  CNS: CN 2-12 intact, power, tone and sensation normal throughout.       Assessment & Plan:

## 2012-06-02 NOTE — Assessment & Plan Note (Signed)
Diet controlled and doing extremely well, pt applauded on this

## 2012-06-06 ENCOUNTER — Other Ambulatory Visit: Payer: Self-pay | Admitting: Family Medicine

## 2012-06-06 DIAGNOSIS — Z139 Encounter for screening, unspecified: Secondary | ICD-10-CM

## 2012-06-09 ENCOUNTER — Encounter: Payer: Self-pay | Admitting: Cardiovascular Disease

## 2012-06-09 ENCOUNTER — Ambulatory Visit (INDEPENDENT_AMBULATORY_CARE_PROVIDER_SITE_OTHER): Payer: Medicare Other | Admitting: Cardiovascular Disease

## 2012-06-09 VITALS — BP 140/83 | HR 87 | Ht 66.0 in | Wt 191.0 lb

## 2012-06-09 DIAGNOSIS — I739 Peripheral vascular disease, unspecified: Secondary | ICD-10-CM

## 2012-06-09 NOTE — Progress Notes (Signed)
History of Present Illness: 78 yo AAF with history of HTN, DM and OA who is here today for PV follow up. I saw her as a new patient in 2011 PV evaluation. She told me that she had no pain in the right or left leg. She underwent segmental pressure study in May 2011 which suggested left lower extremity occlusive disease. ABI was normal on the right and reduced mildly at 0.77 on the left. She had no claudication, rest pain or ulcerations. She has chronic edema in the left leg with signs of venous stasis. She had her left knee replaced in 2011. Non-invasive studies September 2012 with ABI on right of 1.0 and on the left is 0.97. There were no focal lesions seen. Pressures and waveforms on the left suggested mild tibial disease.   She is here for follow up. She has no new complaints. She has no pain in either leg with walking or at rest. Her right knee aches at rest. No calf pain. No chest pain or SOB. She is overall doing well. She has been working in her garden this summer.    Primary Care Physician: Syliva Overman  Past Medical History  Diagnosis Date  . Osteopenia   . Arthritis of knee     bilateral    . Diabetes mellitus, type 2   . Hypertension   . Personal history of colonic polyps 3 years ago     colonoscopy   . Chronic renal insufficiency     Past Surgical History  Procedure Date  . Total abdominal hysterectomy     bleeding   . Left knee replacement 08/14/08    Dr. Romeo Apple   . Left knee manipulation  under anasthesia due to contracture - 03/08/08    Dr. Romeo Apple   . Bunionectomy bilateral  feet   . Wisdom tooth extraction   . Dental surgery   . Gum disease 2010    treated by Dr. Pernell Dupre     Current Outpatient Prescriptions  Medication Sig Dispense Refill  . aspirin (ASPIRIN LOW DOSE) 81 MG EC tablet Take 81 mg by mouth daily.        . Calcium Carbonate-Vitamin D (CALTRATE 600+D) 600-400 MG-UNIT per tablet Take 1 tablet by mouth 2 (two) times daily.        . Flaxseed MISC  by Does not apply route. Two tablespoons daily         . lisinopril-hydrochlorothiazide (PRINZIDE,ZESTORETIC) 20-12.5 MG per tablet TAKE ONE TABLET BY MOUTH EVERY DAY  30 tablet  3  . multivitamin (THERAGRAN) per tablet Take 1 tablet by mouth daily.          No Known Allergies  History   Social History  . Marital Status: Widowed    Spouse Name: N/A    Number of Children: 3  . Years of Education: college    Occupational History  . retired Building surveyor     Social History Main Topics  . Smoking status: Former Games developer  . Smokeless tobacco: Not on file   Comment: smoked for 15 years-not smoked since 1998  . Alcohol Use: No  . Drug Use: No  . Sexually Active: Not on file   Other Topics Concern  . Not on file   Social History Narrative  . No narrative on file    Family History  Problem Relation Age of Onset  . Colon cancer Father   . Cancer Mother   . Stroke Mother   . Hypertension Brother  Review of Systems:  As stated in the HPI and otherwise negative.   BP 140/83  Pulse 87  Ht 5\' 6"  (1.676 m)  Wt 191 lb (86.637 kg)  BMI 30.83 kg/m2  Physical Examination: General: Well developed, well nourished, NAD HEENT: OP clear, mucus membranes moist SKIN: warm, dry. No rashes. Neuro: No focal deficits Musculoskeletal: Muscle strength 5/5 all ext Psychiatric: Mood and affect normal Neck: No JVD, no carotid bruits, no thyromegaly, no lymphadenopathy. Lungs:Clear bilaterally, no wheezes, rhonci, crackles Cardiovascular: Regular rate and rhythm with ectopy. No murmurs, gallops or rubs. Abdomen:Soft. Bowel sounds present. Non-tender.  Extremities: No lower extremity edema. Pulses are trace to 1 + in the bilateral DP/PT.  EKG: Sinus, PACs.   Assessment and Plan:   1. PAD: Stable. No changes. Will repeat ABI next month. I will see her in one year. She will let us know if her clinical status changes.

## 2012-06-09 NOTE — Patient Instructions (Signed)
Your physician wants you to follow-up in:  12 months.You will receive a reminder letter in the mail two months in advance. If you don't receive a letter, please call our office to schedule the follow-up appointment.  Your physician has requested that you have an ankle brachial index (ABI). During this test an ultrasound and blood pressure cuff are used to evaluate the arteries that supply the arms and legs with blood. Allow thirty minutes for this exam. There are no restrictions or special instructions. To be done in September.

## 2012-07-10 ENCOUNTER — Ambulatory Visit (HOSPITAL_COMMUNITY)
Admission: RE | Admit: 2012-07-10 | Discharge: 2012-07-10 | Disposition: A | Discharge status: Home / Self Care | Payer: Medicare Other | Source: Ambulatory Visit | Attending: Family Medicine | Admitting: Family Medicine

## 2012-07-10 DIAGNOSIS — Z1231 Encounter for screening mammogram for malignant neoplasm of breast: Secondary | ICD-10-CM | POA: Insufficient documentation

## 2012-07-10 DIAGNOSIS — Z139 Encounter for screening, unspecified: Secondary | ICD-10-CM

## 2012-07-11 ENCOUNTER — Encounter (INDEPENDENT_AMBULATORY_CARE_PROVIDER_SITE_OTHER): Payer: Medicare Other

## 2012-07-11 DIAGNOSIS — I739 Peripheral vascular disease, unspecified: Secondary | ICD-10-CM

## 2012-07-13 ENCOUNTER — Telehealth: Payer: Self-pay | Admitting: Cardiovascular Disease

## 2012-07-13 NOTE — Telephone Encounter (Signed)
Spoke with pt and reviewed ABI results.  

## 2012-07-13 NOTE — Telephone Encounter (Signed)
New Problem: ° ° ° °Patient returned your call.  Please call back. °

## 2012-08-16 ENCOUNTER — Ambulatory Visit (INDEPENDENT_AMBULATORY_CARE_PROVIDER_SITE_OTHER): Payer: Medicare Other

## 2012-08-16 DIAGNOSIS — Z23 Encounter for immunization: Secondary | ICD-10-CM

## 2012-09-21 ENCOUNTER — Encounter: Payer: Self-pay | Admitting: Family Medicine

## 2012-09-21 ENCOUNTER — Ambulatory Visit (INDEPENDENT_AMBULATORY_CARE_PROVIDER_SITE_OTHER): Payer: Medicare Other | Admitting: Family Medicine

## 2012-09-21 VITALS — BP 130/82 | HR 60 | Resp 18 | Ht 66.0 in | Wt 196.0 lb

## 2012-09-21 DIAGNOSIS — I10 Essential (primary) hypertension: Secondary | ICD-10-CM

## 2012-09-21 DIAGNOSIS — Z0000 Encounter for general adult medical examination without abnormal findings: Secondary | ICD-10-CM

## 2012-09-21 DIAGNOSIS — E119 Type 2 diabetes mellitus without complications: Secondary | ICD-10-CM

## 2012-09-21 NOTE — Patient Instructions (Addendum)
F/U in 4. 5 month  You are doing exceptionally well, no changes in medication.  No medication changes.  Keep active and continue to eat healthily, a lot of fruit and vegetables, drink a lot of water  Chem 7 and HBA1C non fasting in Januaruy  Please flush your ears at home, the left has extra wax. Do NOT use qtips

## 2012-09-21 NOTE — Progress Notes (Signed)
Subjective:    Patient ID: [name], female    DOB: 1934-08-29, 78 y.o.   MRN: [id number]  HPI Preventive Screening-Counseling & Management   Patient present here today for a Medicare annual wellness visit.   Current Problems (verified)   Medications Prior to Visit Allergies (verified)   PAST HISTORY  Family History  Social History widow and mother of 3 adult children, lives independently though 1 daughter has moved back in with her. Never alcohol , nicotine or illicit drug use   Risk Factors  Current exercise habits:  Limited due to severe arthritis but remains as active as possible  Dietary issues discussed:low carb, low fat diet which pt is already committed to   Cardiac risk factors: diabetes Depression Screen  (Note: if answer to either of the following is "Yes", a more complete depression screening is indicated)   Over the past two weeks, have you felt down, depressed or hopeless? No  Over the past two weeks, have you felt little interest or pleasure in doing things? No  Have you lost interest or pleasure in daily life? No  Do you often feel hopeless? No  Do you cry easily over simple problems? No   Activities of Daily Living  In your present state of health, do you have any difficulty performing the following activities?  Driving?: No Managing money?: No Feeding yourself?:No Getting from bed to chair?:yes severe arthritis Climbing a flight of stairs?:yes Preparing food and eating?:No Bathing or showering?:No Getting dressed?:No Getting to the toilet?:no Using the toilet?:No Moving around from place to place?: ambulates with a cane Fall Risk Assessment In the past year have you fallen or had a near fall?:No Are you currently taking any medications that make you dizziness?:No   Hearing Difficulties: No Do you often ask people to speak up or repeat themselves?:No Do you experience ringing or noises in your ears?:No Do you have difficulty  understanding soft or whispered voices?:No  Cognitive Testing  Alert? Yes Normal Appearance?Yes  Oriented to person? Yes Place? Yes  Time? Yes  Displays appropriate judgment?Yes  Can read the correct time from a watch face? yes Are you having problems remembering things?No  Advanced Directives have been discussed with the patient?Yes , full code, needs to discuss with her children, no interest in indefinite life support in the event of brain death    List the Names of Other Physician/Practitioners you currently use: Dr Clifton James, vascular    Assessment:    Annual Wellness Exam   Plan:    During the course of the visit the patient was educated and counseled about appropriate screening and preventive services including:  A healthy diet is rich in fruit, vegetables and whole grains. Poultry fish, nuts and beans are a healthy choice for protein rather then red meat. A low sodium diet and drinking 64 ounces of water daily is generally recommended. Oils and sweet should be limited. Carbohydrates especially for those who are diabetic or overweight, should be limited to 30-45 gram per meal. It is important to eat on a regular schedule, at least 3 times daily. Snacks should be primarily fruits, vegetables or nuts. It is important that you exercise regularly at least 30 minutes 5 times a week. If you develop chest pain, have severe difficulty breathing, or feel very tired, stop exercising immediately and seek medical attention  Immunization reviewed and updated. Cancer screening reviewed and updated    Patient Instructions (the written plan) was given to the patient.  Medicare  Attestation  I have personally reviewed:  The patient's medical and social history  Their use of alcohol, tobacco or illicit drugs  Their current medications and supplements  The patient's functional ability including ADLs,fall risks, home safety risks, cognitive, and hearing and visual impairment  Diet and physical  activities  Evidence for depression or mood disorders  The patient's weight, height, BMI, and visual acuity have been recorded in the chart. I have made referrals, counseling, and provided education to the patient based on review of the above and I have provided the patient with a written personalized care plan for preventive services.      Review of Systems     Objective:   Physical Exam        Assessment & Plan:

## 2012-09-24 DIAGNOSIS — Z0000 Encounter for general adult medical examination without abnormal findings: Secondary | ICD-10-CM | POA: Insufficient documentation

## 2012-09-24 NOTE — Assessment & Plan Note (Signed)
annual wellness documented. Pt is extremely functional, excellent memory, 30/30 on mini mental status, not depressed, and very active at home and in the community, despite svere arthritis for which she uses a cane to Advanced directives need to be further discussed with her children, she has lear ideas. Immunization and screening are reviewed and are up to date

## 2013-02-13 ENCOUNTER — Telehealth: Payer: Self-pay | Admitting: Family Medicine

## 2013-02-13 ENCOUNTER — Other Ambulatory Visit: Payer: Self-pay | Admitting: Family Medicine

## 2013-02-13 DIAGNOSIS — M25562 Pain in left knee: Secondary | ICD-10-CM

## 2013-02-13 NOTE — Telephone Encounter (Signed)
pls refer to Dr Eulah Pont re knee pain, referral entered

## 2013-02-22 LAB — HEMOGLOBIN A1C: Hgb A1c MFr Bld: 6.1 % — ABNORMAL HIGH (ref <5.7)

## 2013-02-22 LAB — BASIC METABOLIC PANEL
CO2: 28 mEq/L (ref 19–32)
Calcium: 9.5 mg/dL (ref 8.4–10.5)
Potassium: 4.6 mEq/L (ref 3.5–5.3)
Sodium: 140 mEq/L (ref 135–145)

## 2013-02-25 NOTE — Telephone Encounter (Signed)
appt scheduled

## 2013-03-01 ENCOUNTER — Ambulatory Visit (INDEPENDENT_AMBULATORY_CARE_PROVIDER_SITE_OTHER): Payer: Medicare Other | Admitting: Family Medicine

## 2013-03-01 ENCOUNTER — Encounter: Payer: Self-pay | Admitting: Family Medicine

## 2013-03-01 VITALS — BP 128/70 | HR 96 | Resp 18 | Ht 66.0 in | Wt 194.0 lb

## 2013-03-01 DIAGNOSIS — I739 Peripheral vascular disease, unspecified: Secondary | ICD-10-CM

## 2013-03-01 DIAGNOSIS — I10 Essential (primary) hypertension: Secondary | ICD-10-CM

## 2013-03-01 DIAGNOSIS — R7302 Impaired glucose tolerance (oral): Secondary | ICD-10-CM

## 2013-03-01 DIAGNOSIS — M1710 Unilateral primary osteoarthritis, unspecified knee: Secondary | ICD-10-CM

## 2013-03-01 DIAGNOSIS — R7309 Other abnormal glucose: Secondary | ICD-10-CM

## 2013-03-01 NOTE — Progress Notes (Signed)
  Subjective:    Patient ID: [name], female    DOB: 06-17-1934, 79 y.o.   MRN: [id number]  HPI The PT is here for follow up and re-evaluation of chronic medical conditions, medication management and review of any available recent lab and radiology data.  Preventive health is updated, specifically  Cancer screening and Immunization.   Questions or concerns regarding consultations or procedures which the PT has had in the interim are  Addressed.Has been evaluated by ortho and vascular surgery since last visit , pt remains stable The PT denies any adverse reactions to current medications since the last visit.  Recently had left knee buckle with near fall, ortho eval was reassuring There are no specific complaints       Review of Systems See HPI Denies recent fever or chills. Denies sinus pressure, nasal congestion, ear pain or sore throat. Denies chest congestion, productive cough or wheezing. Denies chest pains, palpitations and leg swelling Denies abdominal pain, nausea, vomiting,diarrhea or constipation.   Denies dysuria, frequency, hesitancy or incontinence.  Denies headaches, seizures, numbness, or tingling. Denies depression, anxiety or insomnia. Denies skin break down or rash.        Objective:   Physical Exam  Patient alert and oriented and in no cardiopulmonary distress.  HEENT: No facial asymmetry, EOMI, no sinus tenderness,  oropharynx pink and moist.  Neck decreased ROM  no adenopathy.  Chest: Clear to auscultation bilaterally.  CVS: S1, S2 no murmurs, no S3.  ABD: Soft non tender. Bowel sounds normal.  Ext: No edema  MS: Decreased  ROM spine, shoulders, hips and knees.  Skin: Intact, no ulcerations or rash noted.  Psych: Good eye contact, normal affect. Memory intact not anxious or depressed appearing.  CNS: CN 2-12 intact, power, tone and sensation normal throughout.       Assessment & Plan:

## 2013-03-01 NOTE — Patient Instructions (Addendum)
F/u in 5 month with rectal, please call if you need mebefore   Your blood pressure and blood work is excellent.  Continue doing the great things that you are doing  Fasting lipid, chem 7, hBa1C, TSH, CBC  In 5 month   Please schedule appt with [facility] in August when , due  Please plan to schedule your mammogram in September , when due  Bilateral ear irrigation today

## 2013-03-17 NOTE — Assessment & Plan Note (Signed)
H/o type 2 dm now diagnosed as IGT well controlled on diet only

## 2013-03-17 NOTE — Assessment & Plan Note (Signed)
Recent orhto eval showed no deterioration or hardware problem, had recent "buckle"

## 2013-03-17 NOTE — Assessment & Plan Note (Signed)
Controlled, no change in medication DASH diet and commitment to daily physical activity for a minimum of 30 minutes discussed and encouraged, as a part of hypertension management. The importance of attaining a healthy weight is also discussed.  

## 2013-03-17 NOTE — Assessment & Plan Note (Signed)
Followed by vascular surgery, no intervention planned at this time

## 2013-05-04 ENCOUNTER — Other Ambulatory Visit: Payer: Self-pay | Admitting: Family Medicine

## 2013-06-20 ENCOUNTER — Ambulatory Visit (HOSPITAL_COMMUNITY)
Admission: RE | Admit: 2013-06-20 | Discharge: 2013-06-20 | Disposition: A | Discharge status: Home / Self Care | Payer: Medicare Other | Source: Ambulatory Visit | Attending: Family Medicine | Admitting: Family Medicine

## 2013-06-20 DIAGNOSIS — M069 Rheumatoid arthritis, unspecified: Secondary | ICD-10-CM | POA: Insufficient documentation

## 2013-06-20 DIAGNOSIS — Z13820 Encounter for screening for osteoporosis: Secondary | ICD-10-CM

## 2013-06-20 DIAGNOSIS — M899 Disorder of bone, unspecified: Secondary | ICD-10-CM | POA: Insufficient documentation

## 2013-06-20 DIAGNOSIS — Z780 Asymptomatic menopausal state: Secondary | ICD-10-CM | POA: Insufficient documentation

## 2013-06-28 ENCOUNTER — Other Ambulatory Visit: Payer: Self-pay | Admitting: Family Medicine

## 2013-06-28 DIAGNOSIS — Z139 Encounter for screening, unspecified: Secondary | ICD-10-CM

## 2013-07-04 ENCOUNTER — Telehealth: Payer: Self-pay | Admitting: Family Medicine

## 2013-07-04 NOTE — Telephone Encounter (Signed)
Patient is aware 

## 2013-07-10 ENCOUNTER — Encounter: Payer: Self-pay | Admitting: Family Medicine

## 2013-07-10 ENCOUNTER — Ambulatory Visit (INDEPENDENT_AMBULATORY_CARE_PROVIDER_SITE_OTHER): Payer: Medicare Other | Admitting: Family Medicine

## 2013-07-10 VITALS — BP 122/78 | HR 98 | Resp 16 | Ht 66.0 in | Wt 190.4 lb

## 2013-07-10 DIAGNOSIS — R7309 Other abnormal glucose: Secondary | ICD-10-CM

## 2013-07-10 DIAGNOSIS — I10 Essential (primary) hypertension: Secondary | ICD-10-CM

## 2013-07-10 DIAGNOSIS — Z1211 Encounter for screening for malignant neoplasm of colon: Secondary | ICD-10-CM

## 2013-07-10 DIAGNOSIS — Z13820 Encounter for screening for osteoporosis: Secondary | ICD-10-CM

## 2013-07-10 DIAGNOSIS — R5381 Other malaise: Secondary | ICD-10-CM

## 2013-07-10 DIAGNOSIS — R7302 Impaired glucose tolerance (oral): Secondary | ICD-10-CM

## 2013-07-10 DIAGNOSIS — R7301 Impaired fasting glucose: Secondary | ICD-10-CM

## 2013-07-10 DIAGNOSIS — M1990 Unspecified osteoarthritis, unspecified site: Secondary | ICD-10-CM

## 2013-07-10 DIAGNOSIS — Z23 Encounter for immunization: Secondary | ICD-10-CM

## 2013-07-10 DIAGNOSIS — Z79899 Other long term (current) drug therapy: Secondary | ICD-10-CM

## 2013-07-10 DIAGNOSIS — M899 Disorder of bone, unspecified: Secondary | ICD-10-CM

## 2013-07-10 LAB — POC HEMOCCULT BLD/STL (OFFICE/1-CARD/DIAGNOSTIC): Fecal Occult Blood, POC: NEGATIVE

## 2013-07-10 LAB — BASIC METABOLIC PANEL
Calcium: 9.7 mg/dL (ref 8.4–10.5)
Chloride: 104 mEq/L (ref 96–112)
Creat: 1.36 mg/dL — ABNORMAL HIGH (ref 0.50–1.10)
Glucose, Bld: 99 mg/dL (ref 70–99)
Sodium: 141 mEq/L (ref 135–145)

## 2013-07-10 LAB — LIPID PANEL
Cholesterol: 172 mg/dL (ref 0–200)
HDL: 67 mg/dL (ref >39)
LDL Cholesterol: 94 mg/dL (ref 0–99)
Total CHOL/HDL Ratio: 2.6 Ratio
VLDL: 11 mg/dL (ref 0–40)

## 2013-07-10 LAB — CBC WITH DIFFERENTIAL/PLATELET
Eosinophils Relative: 2 % (ref 0–5)
HCT: 40 % (ref 36.0–46.0)
Hemoglobin: 13.7 g/dL (ref 12.0–15.0)
Lymphocytes Relative: 39 % (ref 12–46)
Lymphs Abs: 1.3 10*3/uL (ref 0.7–4.0)
MCH: 32.1 pg (ref 26.0–34.0)
MCHC: 34.3 g/dL (ref 30.0–36.0)
MCV: 93.7 fL (ref 78.0–100.0)
Monocytes Absolute: 0.3 10*3/uL (ref 0.1–1.0)
Monocytes Relative: 10 % (ref 3–12)
Neutrophils Relative %: 49 % (ref 43–77)
RBC: 4.27 MIL/uL (ref 3.87–5.11)
WBC: 3.4 10*3/uL — ABNORMAL LOW (ref 4.0–10.5)

## 2013-07-10 LAB — HEMOGLOBIN A1C
Hgb A1c MFr Bld: 6.5 % — ABNORMAL HIGH (ref <5.7)
Mean Plasma Glucose: 140 mg/dL — ABNORMAL HIGH (ref <117)

## 2013-07-10 LAB — TSH: TSH: 1.2 u[IU]/mL (ref 0.350–4.500)

## 2013-07-10 MED ORDER — LISINOPRIL-HYDROCHLOROTHIAZIDE 20-12.5 MG PO TABS: ORAL_TABLET | ORAL | Status: DC

## 2013-07-10 MED ORDER — ACETAMINOPHEN 325 MG PO TABS: ORAL_TABLET | ORAL | Status: DC

## 2013-07-10 MED ORDER — DICLOFENAC SODIUM 1 % TD GEL: TRANSDERMAL | Status: DC

## 2013-07-10 MED ORDER — ALENDRONATE SODIUM 70 MG PO TABS: 70.0000 mg | ORAL_TABLET | ORAL | Status: DC

## 2013-07-10 NOTE — Progress Notes (Signed)
  Subjective:    Patient ID: [name], female    DOB: 01-Dec-1933, 79 y.o.   MRN: [id number]  HPI The PT is here for follow up and re-evaluation of chronic medical conditions, medication management and review of any available recent lab and radiology data. Recent dexa shows deterioration in bone health , hence needs to start bisphosphonate  Preventive health is updated, specifically  Cancer screening and Immunization.   Questions or concerns regarding consultations or procedures which the PT has had in the interim are  Addressed.Has upcoming CV eval for PAD. Orthopedics does not recommend surgery at this time The PT denies any adverse reactions to current medications since the last visit.  There are no new concerns.   Recently slipped in her home, no damage from fall      Review of Systems    See HPI Denies recent fever or chills. Denies sinus pressure, nasal congestion, ear pain or sore throat. Denies chest congestion, productive cough or wheezing. Denies chest pains, palpitations and leg swelling Denies abdominal pain, nausea, vomiting,diarrhea or constipation.   Denies dysuria, frequency, hesitancy or incontinence.  Denies headaches, seizures, numbness, or tingling. Denies depression, anxiety or insomnia. Denies skin break down or rash.     Objective:   Physical Exam  Patient alert and oriented and in no cardiopulmonary distress.  HEENT: No facial asymmetry, EOMI, no sinus tenderness,  oropharynx pink and moist.  Neck supple no adenopathy.  Chest: Clear to auscultation bilaterally.  CVS: S1, S2 no murmurs, no S3.  ABD: Soft non tender. Bowel sounds normal. Rectal: no mass, heme negative stool Ext: No edema  MS: decreased  ROM spine, shoulders, hips and knees.  Skin: Intact, no ulcerations or rash noted.  Psych: Good eye contact, normal affect. Memory intact not anxious or depressed appearing.  CNS: CN 2-12 intact, power, tone and sensation normal  throughout.       Assessment & Plan:

## 2013-07-10 NOTE — Patient Instructions (Addendum)
Annual wellness in 4.5 month, cancel October appointment. Call if you need me before   Flu vaccine today  Rectal exam today is normal   Labs today, CBC, lipid, chem 7, TSH, vit D, hBA1C  New medication for thin bones is once weekly fosamax, continue calcium as before  New for arthritis pain is voltaren gel and tylenol  Please be careful not to fall and use safe shoes in the home

## 2013-07-11 ENCOUNTER — Encounter: Payer: Self-pay | Admitting: Cardiovascular Disease

## 2013-07-11 ENCOUNTER — Ambulatory Visit (INDEPENDENT_AMBULATORY_CARE_PROVIDER_SITE_OTHER): Payer: Medicare Other | Admitting: Cardiovascular Disease

## 2013-07-11 VITALS — BP 123/69 | HR 95 | Ht 66.0 in | Wt 190.0 lb

## 2013-07-11 DIAGNOSIS — I739 Peripheral vascular disease, unspecified: Secondary | ICD-10-CM

## 2013-07-11 LAB — VITAMIN D 25 HYDROXY (VIT D DEFICIENCY, FRACTURES): Vit D, 25-Hydroxy: 36 ng/mL (ref 30–89)

## 2013-07-11 NOTE — Patient Instructions (Addendum)
Your physician wants you to follow-up in:  12 months.  You will receive a reminder letter in the mail two months in advance. If you don't receive a letter, please call our office to schedule the follow-up appointment.  Your physician has requested that you have an ankle brachial index (ABI). During this test an ultrasound and blood pressure cuff are used to evaluate the arteries that supply the arms and legs with blood. Allow thirty minutes for this exam. There are no restrictions or special instructions.  

## 2013-07-11 NOTE — Progress Notes (Signed)
History of Present Illness: 79 yo AAF with history of PAD, HTN, DM and OA who is here today for PV follow up. I saw her as a new patient in 2011 for PV evaluation. She told me that she had no pain in the right or left leg. She underwent segmental pressure study in May 2011 which suggested left lower extremity occlusive disease. ABI was normal on the right and reduced mildly at 0.77 on the left. She had no claudication, rest pain or ulcerations. She has chronic edema in the left leg with signs of venous stasis. She had her left knee replaced in 2011. Last lower extremity doppler studies September 2013 with ABI 1.1 bilaterally.   She is here for follow up. She has no new complaints. She has no pain in either leg with walking or at rest. No calf pain. No chest pain or SOB. She is overall doing well. She has been working in her garden this summer.   Primary Care Physician: Syliva Overman  Past Medical History  Diagnosis Date  . Osteopenia   . Arthritis of knee     bilateral    . Diabetes mellitus, type 2   . Hypertension   . Personal history of colonic polyps 3 years ago     colonoscopy   . Chronic renal insufficiency   . PAD (peripheral artery disease)     Past Surgical History  Procedure Laterality Date  . Total abdominal hysterectomy      bleeding   . Left knee replacement  08/14/08    Dr. Romeo Apple   . Left knee manipulation  under anasthesia due to contracture -  03/08/08    Dr. Romeo Apple   . Bunionectomy bilateral  feet    . Wisdom tooth extraction    . Dental surgery    . Gum disease  2010    treated by Dr. Pernell Dupre     Current Outpatient Prescriptions  Medication Sig Dispense Refill  . acetaminophen (TYLENOL) 325 MG tablet One tablet once daily for arthritic pain, as needed  30 tablet  5  . alendronate (FOSAMAX) 70 MG tablet Take 1 tablet (70 mg total) by mouth every 7 (seven) days. Take with a full glass of water on an empty stomach.  4 tablet  11  . aspirin (ASPIRIN LOW  DOSE) 81 MG EC tablet Take 81 mg by mouth daily.        . Calcium Carbonate-Vitamin D (CALTRATE 600+D) 600-400 MG-UNIT per tablet Take 1 tablet by mouth 2 (two) times daily.        . diclofenac sodium (VOLTAREN) 1 % GEL Apply twice daily to affected joints for pain and swelling, as needed  100 g  5  . lisinopril-hydrochlorothiazide (PRINZIDE,ZESTORETIC) 20-12.5 MG per tablet TAKE ONE TABLET BY MOUTH EVERY DAY  30 tablet  5  . multivitamin (THERAGRAN) per tablet Take 1 tablet by mouth daily.         No current facility-administered medications for this visit.    No Known Allergies  History   Social History  . Marital Status: Widowed    Spouse Name: N/A    Number of Children: 3  . Years of Education: college    Occupational History  . retired Building surveyor     Social History Main Topics  . Smoking status: Former Games developer  . Smokeless tobacco: Not on file     Comment: smoked for 15 years-not smoked since 1998  . Alcohol Use: No  .  Drug Use: No  . Sexual Activity: Not on file   Other Topics Concern  . Not on file   Social History Narrative  . No narrative on file    Family History  Problem Relation Age of Onset  . Colon cancer Father   . Cancer Mother   . Stroke Mother   . Hypertension Brother     Review of Systems:  As stated in the HPI and otherwise negative.   BP 123/69  Pulse 95  Ht 5\' 6"  (1.676 m)  Wt 190 lb (86.183 kg)  BMI 30.68 kg/m2  Physical Examination: General: Well developed, well nourished, NAD HEENT: OP clear, mucus membranes moist SKIN: warm, dry. No rashes. Neuro: No focal deficits Musculoskeletal: Muscle strength 5/5 all ext Psychiatric: Mood and affect normal Neck: No JVD, no carotid bruits, no thyromegaly, no lymphadenopathy. Lungs:Clear bilaterally, no wheezes, rhonci, crackles Cardiovascular: Regular rate and rhythm with ectopy. No murmurs, gallops or rubs. Abdomen:Soft. Bowel sounds present. Non-tender.  Extremities: No lower  extremity edema. Pulses are trace to 1 + in the bilateral DP/PT.  EKG: Sinus, rate 89 bpm. PACs.   Assessment and Plan:   1. PAD: Stable. No changes. Will repeat ABI now.  I will see her in one year. She will let us know if her clinical status changes.

## 2013-07-12 ENCOUNTER — Ambulatory Visit (HOSPITAL_COMMUNITY)
Admission: RE | Admit: 2013-07-12 | Discharge: 2013-07-12 | Disposition: A | Discharge status: Home / Self Care | Payer: Medicare Other | Source: Ambulatory Visit | Attending: Family Medicine | Admitting: Family Medicine

## 2013-07-12 DIAGNOSIS — Z1231 Encounter for screening mammogram for malignant neoplasm of breast: Secondary | ICD-10-CM | POA: Insufficient documentation

## 2013-07-12 DIAGNOSIS — Z139 Encounter for screening, unspecified: Secondary | ICD-10-CM

## 2013-07-15 NOTE — Assessment & Plan Note (Signed)
Controlled, no change in medication  

## 2013-07-15 NOTE — Assessment & Plan Note (Signed)
Tylenol and topical anti inflammatory for pain as needed Safety discussed esp in the home where she often does not use any assistive device

## 2013-07-15 NOTE — Assessment & Plan Note (Signed)
Worsening iin bone density on recent study, in addition to calcium , pt to start fosamax

## 2013-07-15 NOTE — Assessment & Plan Note (Signed)
Continues to do well wit dietary management only, she is to continue this

## 2013-07-24 ENCOUNTER — Ambulatory Visit (HOSPITAL_COMMUNITY): Payer: Medicare Other | Attending: Cardiology

## 2013-07-24 DIAGNOSIS — R209 Unspecified disturbances of skin sensation: Secondary | ICD-10-CM | POA: Insufficient documentation

## 2013-07-24 DIAGNOSIS — I739 Peripheral vascular disease, unspecified: Secondary | ICD-10-CM | POA: Insufficient documentation

## 2013-07-24 DIAGNOSIS — I10 Essential (primary) hypertension: Secondary | ICD-10-CM | POA: Insufficient documentation

## 2013-07-24 DIAGNOSIS — E119 Type 2 diabetes mellitus without complications: Secondary | ICD-10-CM | POA: Insufficient documentation

## 2013-07-30 ENCOUNTER — Telehealth: Payer: Self-pay | Admitting: Cardiovascular Disease

## 2013-07-30 NOTE — Telephone Encounter (Signed)
New message    Returning [name]'s call----want lab results

## 2013-07-30 NOTE — Telephone Encounter (Signed)
Notified of doppler results.  She states she is doing good.  No questions.

## 2013-11-20 ENCOUNTER — Other Ambulatory Visit: Payer: Self-pay | Admitting: Family Medicine

## 2013-11-20 LAB — HEMOGLOBIN A1C
Hgb A1c MFr Bld: 6.4 % — ABNORMAL HIGH (ref <5.7)
MEAN PLASMA GLUCOSE: 137 mg/dL — AB (ref <117)

## 2013-11-20 LAB — CBC WITH DIFFERENTIAL/PLATELET
BASOS ABS: 0 10*3/uL (ref 0.0–0.1)
BASOS PCT: 1 % (ref 0–1)
Eosinophils Absolute: 0.1 10*3/uL (ref 0.0–0.7)
Eosinophils Relative: 3 % (ref 0–5)
HCT: 38.3 % (ref 36.0–46.0)
Hemoglobin: 13.2 g/dL (ref 12.0–15.0)
Lymphocytes Relative: 38 % (ref 12–46)
Lymphs Abs: 1.5 10*3/uL (ref 0.7–4.0)
MCH: 31.4 pg (ref 26.0–34.0)
MCHC: 33.8 g/dL (ref 30.0–36.0)
MCV: 91.2 fL (ref 78.0–100.0)
Monocytes Absolute: 0.4 10*3/uL (ref 0.1–1.0)
Monocytes Relative: 11 % (ref 3–12)
NEUTROS ABS: 1.9 10*3/uL (ref 1.7–7.7)
Neutrophils Relative %: 47 % (ref 43–77)
PLATELETS: 225 10*3/uL (ref 150–400)
RBC: 4.2 MIL/uL (ref 3.87–5.11)
RDW: 14.4 % (ref 11.5–15.5)
WBC: 4 10*3/uL (ref 4.0–10.5)

## 2013-11-20 LAB — LIPID PANEL
CHOLESTEROL: 154 mg/dL (ref 0–200)
HDL: 72 mg/dL (ref >39)
LDL CALC: 73 mg/dL (ref 0–99)
TRIGLYCERIDES: 43 mg/dL (ref <150)
Total CHOL/HDL Ratio: 2.1 Ratio
VLDL: 9 mg/dL (ref 0–40)

## 2013-11-20 LAB — BASIC METABOLIC PANEL
BUN: 27 mg/dL — ABNORMAL HIGH (ref 6–23)
CHLORIDE: 106 meq/L (ref 96–112)
CO2: 29 mEq/L (ref 19–32)
CREATININE: 1.18 mg/dL — AB (ref 0.50–1.10)
Calcium: 9.6 mg/dL (ref 8.4–10.5)
Glucose, Bld: 94 mg/dL (ref 70–99)
Potassium: 4.4 mEq/L (ref 3.5–5.3)
Sodium: 144 mEq/L (ref 135–145)

## 2013-11-21 ENCOUNTER — Ambulatory Visit (INDEPENDENT_AMBULATORY_CARE_PROVIDER_SITE_OTHER): Payer: Medicare Other | Admitting: Family Medicine

## 2013-11-21 ENCOUNTER — Encounter: Payer: Self-pay | Admitting: Family Medicine

## 2013-11-21 ENCOUNTER — Encounter (INDEPENDENT_AMBULATORY_CARE_PROVIDER_SITE_OTHER): Payer: Self-pay

## 2013-11-21 VITALS — BP 114/78 | HR 101 | Resp 16 | Ht 66.0 in | Wt 201.8 lb

## 2013-11-21 DIAGNOSIS — R7302 Impaired glucose tolerance (oral): Secondary | ICD-10-CM

## 2013-11-21 DIAGNOSIS — I10 Essential (primary) hypertension: Secondary | ICD-10-CM

## 2013-11-21 DIAGNOSIS — Z0000 Encounter for general adult medical examination without abnormal findings: Secondary | ICD-10-CM

## 2013-11-21 DIAGNOSIS — R7309 Other abnormal glucose: Secondary | ICD-10-CM

## 2013-11-21 LAB — TSH: TSH: 0.995 u[IU]/mL (ref 0.350–4.500)

## 2013-11-21 MED ORDER — LISINOPRIL-HYDROCHLOROTHIAZIDE 20-12.5 MG PO TABS: ORAL_TABLET | ORAL | Status: DC

## 2013-11-21 NOTE — Patient Instructions (Addendum)
F/u in 4.5 month, call if you need me before  Please keep active, to strengthen bones   HBA1C and chem 7 non fasting in 4.5 month    I will follow up on echo cardiogram you had in 2010 and let you know if you need to return to cardiology

## 2013-11-21 NOTE — Assessment & Plan Note (Signed)
Annual exam as documented. Counseling done  re healthy lifestyle involving commitment to 150 minutes exercise per week, heart healthy diet, and attaining healthy weight.The importance of adequate sleep also discussed. Regular seat belt use and safe storage  of firearms if patient has them, is also discussed. Changes in health habits are decided on by the patient with goals and time frames  set for achieving them. Immunization and cancer screening needs are specifically addressed at this visit.  

## 2013-11-21 NOTE — Progress Notes (Signed)
Subjective:    Patient ID: [name], female    DOB: 06/14/1934, 80 y.o.   MRN: [id number]  HPI Preventive Screening-Counseling & Management   Patient present here today for a Medicare annual wellness visit.   Current Problems (verified)   Medications Prior to Visit Allergies (verified)   PAST HISTORY  Family History; in body of chart  Social History : Widow since 1994, retired at age 43,mother of 3 adult children. One lives with her   Risk Factors  Current exercise habits:  Chair exercises, stationary bike  daily  Dietary issues discussed:Heart healthy diet   Cardiac risk factors: Nothing significant  Depression Screen  (Note: if answer to either of the following is "Yes", a more complete depression screening is indicated)   Over the past two weeks, have you felt down, depressed or hopeless? No  Over the past two weeks, have you felt little interest or pleasure in doing things? No  Have you lost interest or pleasure in daily life? No  Do you often feel hopeless? No  Do you cry easily over simple problems? No   Activities of Daily Living  In your present state of health, do you have any difficulty performing the following activities?  Driving?: No Managing money?: No Feeding yourself?:No Getting from bed to chair?:yes Climbing a flight of stairs?:yes  Preparing food and eating?:No Bathing or showering?:No Getting dressed?:No Getting to the toilet?:No Using the toilet?:No Moving around from place to place?: yes, ambulate  With clawed cane  Fall Risk Assessment In the past year have you fallen or had a near fall?:No Are you currently taking any medications that make you dizzy?:No   Hearing Difficulties: No Do you often ask people to speak up or repeat themselves?:No Do you experience ringing or noises in your ears?:No Do you have difficulty understanding soft or whispered voices?:No  Cognitive Testing  Alert? Yes Normal Appearance?Yes  Oriented  to person? Yes Place? Yes  Time? Yes  Displays appropriate judgment?Yes  Can read the correct time from a watch face? yes Are you having problems remembering things?No  Advanced Directives have been discussed with the patient?Yes , full code   List the Names of Other Physician/Practitioners you currently use: Leisa Lenz, , dentist, Ronne Binning any recent Medical Services you may have received from other than [facility] providers in the past year (date may be approximate).   Assessment:    Annual Wellness Exam   Plan:    During the course of the visit the patient was educated and counseled about appropriate screening and preventive services including:  A healthy diet is rich in fruit, vegetables and whole grains. Poultry fish, nuts and beans are a healthy choice for protein rather then red meat. A low sodium diet and drinking 64 ounces of water daily is generally recommended. Oils and sweet should be limited. Carbohydrates especially for those who are diabetic or overweight, should be limited to 30-45 gram per meal. It is important to eat on a regular schedule, at least 3 times daily. Snacks should be primarily fruits, vegetables or nuts. It is important that you exercise regularly at least 30 minutes 5 times a week. If you develop chest pain, have severe difficulty breathing, or feel very tired, stop exercising immediately and seek medical attention  Immunization reviewed and updated. Cancer screening reviewed and updated    Patient Instructions (the written plan) was given to the patient.  Medicare Attestation  I have personally reviewed:  The  patient's medical and social history  Their use of alcohol, tobacco or illicit drugs  Their current medications and supplements  The patient's functional ability including ADLs,fall risks, home safety risks, cognitive, and hearing and visual impairment  Diet and physical activities  Evidence for depression or mood disorders  The  patient's weight, height, BMI, and visual acuity have been recorded in the chart. I have made referrals, counseling, and provided education to the patient based on review of the above and I have provided the patient with a written personalized care plan for preventive services.      Review of Systems     Objective:   Physical Exam        Assessment & Plan:

## 2013-11-25 ENCOUNTER — Encounter: Payer: Self-pay | Admitting: Family Medicine

## 2014-04-24 ENCOUNTER — Ambulatory Visit: Payer: Medicare Other | Admitting: Family Medicine

## 2014-05-02 LAB — BASIC METABOLIC PANEL
BUN: 23 mg/dL (ref 6–23)
CHLORIDE: 105 meq/L (ref 96–112)
CO2: 27 mEq/L (ref 19–32)
Calcium: 9.5 mg/dL (ref 8.4–10.5)
Creat: 1.27 mg/dL — ABNORMAL HIGH (ref 0.50–1.10)
Glucose, Bld: 82 mg/dL (ref 70–99)
POTASSIUM: 4.3 meq/L (ref 3.5–5.3)
SODIUM: 141 meq/L (ref 135–145)

## 2014-05-02 LAB — HEMOGLOBIN A1C
Hgb A1c MFr Bld: 6.7 % — ABNORMAL HIGH (ref <5.7)
Mean Plasma Glucose: 146 mg/dL — ABNORMAL HIGH (ref <117)

## 2014-05-03 ENCOUNTER — Encounter (INDEPENDENT_AMBULATORY_CARE_PROVIDER_SITE_OTHER): Payer: Self-pay

## 2014-05-03 ENCOUNTER — Ambulatory Visit (INDEPENDENT_AMBULATORY_CARE_PROVIDER_SITE_OTHER): Payer: Medicare Other | Admitting: Family Medicine

## 2014-05-03 ENCOUNTER — Encounter: Payer: Self-pay | Admitting: Family Medicine

## 2014-05-03 VITALS — BP 122/76 | HR 94 | Resp 16 | Wt 200.0 lb

## 2014-05-03 DIAGNOSIS — E119 Type 2 diabetes mellitus without complications: Secondary | ICD-10-CM

## 2014-05-03 DIAGNOSIS — M1710 Unilateral primary osteoarthritis, unspecified knee: Secondary | ICD-10-CM

## 2014-05-03 DIAGNOSIS — R7302 Impaired glucose tolerance (oral): Secondary | ICD-10-CM

## 2014-05-03 DIAGNOSIS — Z23 Encounter for immunization: Secondary | ICD-10-CM

## 2014-05-03 DIAGNOSIS — Z9181 History of falling: Secondary | ICD-10-CM

## 2014-05-03 DIAGNOSIS — I10 Essential (primary) hypertension: Secondary | ICD-10-CM

## 2014-05-03 DIAGNOSIS — R7309 Other abnormal glucose: Secondary | ICD-10-CM

## 2014-05-03 DIAGNOSIS — M1711 Unilateral primary osteoarthritis, right knee: Secondary | ICD-10-CM

## 2014-05-03 NOTE — Progress Notes (Signed)
   Subjective:    Patient ID: [name], female    DOB: April 28, 1934, 80 y.o.   MRN: [id number]  HPI The PT is here for follow up and re-evaluation of chronic medical conditions, medication management and review of any available recent lab and radiology data.  Preventive health is updated, specifically  Cancer screening and Immunization.   Needed to see ortho since last visit due to fall, right knee just "gave out" , no pain, still resistant to replacement currently The PT denies any adverse reactions to current medications since the last visit.        Review of Systems See HPI Denies recent fever or chills. Denies sinus pressure, nasal congestion, ear pain or sore throat. Denies chest congestion, productive cough or wheezing. Denies chest pains, palpitations and leg swelling Denies abdominal pain, nausea, vomiting,diarrhea or constipation.   Denies dysuria, frequency, hesitancy or incontinence. C/o limitation in mobility.Ambulates with a cane Denies headaches, seizures, numbness, or tingling. Denies depression, anxiety or insomnia. Denies skin break down or rash.         Objective:   Physical Exam BP 122/76  Pulse 94  Resp 16  Wt 200 lb (90.719 kg)  SpO2 96% Patient alert and oriented and in no cardiopulmonary distress.  HEENT: No facial asymmetry, EOMI,   oropharynx pink and moist.  Neck supple no JVD, no mass.  Chest: Clear to auscultation bilaterally.  CVS: S1, S2 no murmurs, no S3.Regular rate.  ABD: Soft non tender.   Ext: No edema  MS: Adequate though reduced  ROM spine, shoulders, hips and knees.  Skin: Intact, no ulcerations or rash noted.  Psych: Good eye contact, normal affect. Memory intact not anxious or depressed appearing.  CNS: CN 2-12 intact, power,  normal throughout.no focal deficits noted.        Assessment & Plan:  HYPERTENSION Controlled, no change in medication DASH diet and commitment to daily physical activity for a  minimum of 30 minutes discussed and encouraged, as a part of hypertension management. The importance of attaining a healthy weight is also discussed.   Type 2 diabetes, diet controlled Controlled, no change in medication, slightly deteriorated, but pt reports increased fruit intake in recent times Patient advised to reduce carb and sweets, commit to regular physical activity, tand attempt to lose weight, to improve blood sugar control.   Osteoarthritis of right knee Worsening, buckled and pt fell in last 4 month, still resistant to replacement, received intra articular injection which has helped. Of note denies significant pain I encouraged her strongly to think about replacement , due to instability  At high risk for falls Home sfaftey reviewed, also pt has special stick which she uses while out in her garden, really needs right knee replacement

## 2014-05-03 NOTE — Patient Instructions (Signed)
Annual physical exam in 4 month, call if you need me before  Boaz today, with info on the vaccine  Blood work is good.  Be careful not to fall since your right knee buckles at times, you may need to reconsider knee replacement  hBA1C and chem 7 non fast in 4 month  You will get container to bring back microalb during regular hours  Keep up the great work!

## 2014-05-04 DIAGNOSIS — M1711 Unilateral primary osteoarthritis, right knee: Secondary | ICD-10-CM | POA: Insufficient documentation

## 2014-05-04 DIAGNOSIS — Z9181 History of falling: Secondary | ICD-10-CM | POA: Insufficient documentation

## 2014-05-04 NOTE — Assessment & Plan Note (Addendum)
Controlled, no change in medication, slightly deteriorated, but pt reports increased fruit intake in recent times Patient advised to reduce carb and sweets, commit to regular physical activity, tand attempt to lose weight, to improve blood sugar control.

## 2014-05-04 NOTE — Progress Notes (Signed)
   Subjective:    Patient ID: [name], female    DOB: 1934/06/16, 80 y.o.   MRN: [id number]  HPI    Review of Systems     Objective:   Physical Exam        Assessment & Plan:

## 2014-05-04 NOTE — Assessment & Plan Note (Signed)
Home sfaftey reviewed, also pt has special stick which she uses while out in her garden, really needs right knee replacement

## 2014-05-04 NOTE — Assessment & Plan Note (Addendum)
Worsening, buckled and pt fell in last 4 month, still resistant to replacement, received intra articular injection which has helped. Of note denies significant pain I encouraged her strongly to think about replacement , due to instability

## 2014-05-04 NOTE — Addendum Note (Signed)
Addended by: Tula Nakayama E on: 05/04/2014 11:27 AM   Modules accepted: Orders

## 2014-05-04 NOTE — Assessment & Plan Note (Signed)
Controlled, no change in medication DASH diet and commitment to daily physical activity for a minimum of 30 minutes discussed and encouraged, as a part of hypertension management. The importance of attaining a healthy weight is also discussed.  

## 2014-05-08 LAB — MICROALBUMIN / CREATININE URINE RATIO
Creatinine, Urine: 150.7 mg/dL
MICROALB/CREAT RATIO: 3.3 mg/g (ref 0.0–30.0)
Microalb, Ur: 0.5 mg/dL (ref 0.00–1.89)

## 2014-06-12 ENCOUNTER — Other Ambulatory Visit: Payer: Self-pay | Admitting: Family Medicine

## 2014-06-12 DIAGNOSIS — Z139 Encounter for screening, unspecified: Secondary | ICD-10-CM

## 2014-07-05 ENCOUNTER — Ambulatory Visit: Payer: Medicare Other | Admitting: Cardiovascular Disease

## 2014-07-11 ENCOUNTER — Other Ambulatory Visit: Payer: Self-pay | Admitting: Family Medicine

## 2014-07-15 ENCOUNTER — Ambulatory Visit (HOSPITAL_COMMUNITY)
Admission: RE | Admit: 2014-07-15 | Discharge: 2014-07-15 | Disposition: A | Discharge status: Home / Self Care | Payer: Medicare Other | Source: Ambulatory Visit | Attending: Family Medicine | Admitting: Family Medicine

## 2014-07-15 DIAGNOSIS — Z139 Encounter for screening, unspecified: Secondary | ICD-10-CM

## 2014-07-15 DIAGNOSIS — Z1231 Encounter for screening mammogram for malignant neoplasm of breast: Secondary | ICD-10-CM | POA: Insufficient documentation

## 2014-09-21 LAB — BASIC METABOLIC PANEL
BUN: 26 mg/dL — ABNORMAL HIGH (ref 6–23)
CHLORIDE: 109 meq/L (ref 96–112)
CO2: 30 meq/L (ref 19–32)
CREATININE: 1.29 mg/dL — AB (ref 0.50–1.10)
Calcium: 9.8 mg/dL (ref 8.4–10.5)
Glucose, Bld: 81 mg/dL (ref 70–99)
POTASSIUM: 4.3 meq/L (ref 3.5–5.3)
Sodium: 144 mEq/L (ref 135–145)

## 2014-09-21 LAB — HEMOGLOBIN A1C
HEMOGLOBIN A1C: 6.8 % — AB (ref <5.7)
MEAN PLASMA GLUCOSE: 148 mg/dL — AB (ref <117)

## 2014-09-24 ENCOUNTER — Encounter: Payer: Self-pay | Admitting: Family Medicine

## 2014-09-24 ENCOUNTER — Ambulatory Visit (INDEPENDENT_AMBULATORY_CARE_PROVIDER_SITE_OTHER): Payer: Medicare Other

## 2014-09-24 ENCOUNTER — Ambulatory Visit (INDEPENDENT_AMBULATORY_CARE_PROVIDER_SITE_OTHER): Payer: Medicare Other | Admitting: Family Medicine

## 2014-09-24 VITALS — BP 122/82 | HR 86 | Resp 16 | Ht 66.0 in | Wt 200.0 lb

## 2014-09-24 DIAGNOSIS — M1710 Unilateral primary osteoarthritis, unspecified knee: Secondary | ICD-10-CM

## 2014-09-24 DIAGNOSIS — Z0000 Encounter for general adult medical examination without abnormal findings: Secondary | ICD-10-CM

## 2014-09-24 DIAGNOSIS — Z1212 Encounter for screening for malignant neoplasm of rectum: Secondary | ICD-10-CM

## 2014-09-24 DIAGNOSIS — Z23 Encounter for immunization: Secondary | ICD-10-CM

## 2014-09-24 DIAGNOSIS — Z13220 Encounter for screening for lipoid disorders: Secondary | ICD-10-CM

## 2014-09-24 DIAGNOSIS — Z1211 Encounter for screening for malignant neoplasm of colon: Secondary | ICD-10-CM | POA: Insufficient documentation

## 2014-09-24 DIAGNOSIS — IMO0002 Reserved for concepts with insufficient information to code with codable children: Secondary | ICD-10-CM

## 2014-09-24 DIAGNOSIS — E119 Type 2 diabetes mellitus without complications: Secondary | ICD-10-CM

## 2014-09-24 DIAGNOSIS — I10 Essential (primary) hypertension: Secondary | ICD-10-CM

## 2014-09-24 LAB — POC HEMOCCULT BLD/STL (OFFICE/1-CARD/DIAGNOSTIC): Fecal Occult Blood, POC: NEGATIVE

## 2014-09-24 NOTE — Progress Notes (Signed)
   Subjective:    Patient ID: [name], female    DOB: Jul 19, 1934, 80 y.o.   MRN: [id number]  HPI  Patient is in for annual physical exam. No other health concerns are expressed or addressed at the visit. Flu vaccine is administered   Review of Systems See HPI     Objective:   Physical Exam  BP 122/82 mmHg  Pulse 86  Resp 16  Ht 5\' 6"  (1.676 m)  Wt 200 lb (90.719 kg)  BMI 32.30 kg/m2  SpO2 97%   Pleasant well nourished female, alert and oriented x 3, in no cardio-pulmonary distress. Afebrile. HEENT No facial trauma or asymetry. Sinuses non tender.  Extra occullar muscles intact, pupils equally reactive to light. External ears normal, tympanic membranes clear. Oropharynx moist, no exudate,fairly  good dentition. Neck: supple, no adenopathy,JVD or thyromegaly.No bruits.  Chest: Clear to ascultation bilaterally.No crackles or wheezes. Non tender to palpation  Breast: No asymetry,no masses or lumps. No tenderness. No nipple discharge or inversion. No axillary or supraclavicular adenopathy  Cardiovascular system; Heart sounds normal,  S1 and  S2 ,no S3.  No murmur, or thrill. Apical beat not displaced Peripheral pulses normal.  Abdomen: Soft, non tender, no organomegaly or masses. No bruits. Bowel sounds normal. No guarding, tenderness or rebound.  Rectal:  Normal sphincter tone. No mass.No rectal masses.  Guaiac negative stool.  GU: External genitalia normal female genitalia , female distribution of hair. No lesions. Urethral meatus normal in size, no  Prolapse, no lesions visibly  Present. Bladder non tender. Vagina pink and moist , with no visible lesions , discharge present . Adequate pelvic support no  cystocele or rectocele noted  Uterus absent, no adnexal masses, no  adnexal tenderness.   Musculoskeletal exam: Decreased  ROM of spine, hips , shoulders and knees.  No muscle wasting or atrophy.   Neurologic: Cranial nerves 2 to 12  intact. Power, tone ,sensation and reflexes normal throughout. Abnormal gait due to severe arthritis,. No tremor.  Skin: Intact, no ulceration, erythema , scaling or rash noted. Pigmentation normal throughout  Psych; Normal mood and affect. Judgement and concentration normal       Assessment & Plan:  Annual physical exam Annual exam as documented. Counseling done  re healthy lifestyle involving commitment to regular  exercise as able, heart healthy diet, and attaining healthy weight.The importance of adequate sleep also discussed. Regular seat belt use and home safety, is also discussed. C Immunization and cancer screening needs are specifically addressed at this visit.   Need for prophylactic vaccination and inoculation against influenza Vaccine administered at visit.

## 2014-09-24 NOTE — Patient Instructions (Addendum)
F/u in 3.5 month, call if you need me before  Flu vaccine today  Watch sweets and carbs as blood sugar is climbing slowly please  Fasting lipid, cmp and EGFR, hBa1C, CBC and TSH in 3.5 month  pls keep eye appt. Call for  Referral to podiatry once you are ready to go  Be careful not to fall.  Exam today is good and blood pressure is excellent

## 2014-09-26 ENCOUNTER — Ambulatory Visit (INDEPENDENT_AMBULATORY_CARE_PROVIDER_SITE_OTHER): Payer: Medicare Other | Admitting: Cardiovascular Disease

## 2014-09-26 ENCOUNTER — Encounter: Payer: Self-pay | Admitting: Cardiovascular Disease

## 2014-09-26 VITALS — BP 128/66 | HR 101 | Ht 67.0 in | Wt 200.0 lb

## 2014-09-26 DIAGNOSIS — I739 Peripheral vascular disease, unspecified: Secondary | ICD-10-CM

## 2014-09-26 DIAGNOSIS — I491 Atrial premature depolarization: Secondary | ICD-10-CM

## 2014-09-26 NOTE — Patient Instructions (Signed)
Your physician wants you to follow-up in:  12 months.  You will receive a reminder letter in the mail two months in advance. If you don't receive a letter, please call our office to schedule the follow-up appointment.   

## 2014-09-26 NOTE — Progress Notes (Signed)
History of Present Illness: 80 yo AAF with history of PAD, HTN, DM and OA who is here today for PV follow up. I saw her as a new patient in 2011 for PV evaluation. She told me that she had no pain in the right or left leg. She underwent segmental pressure study in May 2011 which suggested left lower extremity occlusive disease. ABI was normal on the right and reduced mildly at 0.77 on the left. She had no claudication, rest pain or ulcerations. She has chronic edema in the left leg with signs of venous stasis. She had her left knee replaced in 2011. Last lower extremity doppler studies October 2014 with ABI 0.92 right and 0.94 on left.    She is here for follow up. She has no new complaints. She has no pain in either leg with walking or at rest. No calf pain. No chest pain or SOB. She is overall doing well.   Primary Care Physician: Tula Nakayama  Past Medical History  Diagnosis Date  . Osteopenia   . Arthritis of knee     bilateral    . Diabetes mellitus, type 2   . Hypertension   . Personal history of colonic polyps 3 years ago     colonoscopy   . Chronic renal insufficiency   . PAD (peripheral artery disease)     Past Surgical History  Procedure Laterality Date  . Total abdominal hysterectomy      bleeding   . Left knee replacement  08/14/08    Dr. Aline Brochure   . Left knee manipulation  under anasthesia due to contracture -  03/08/08    Dr. Aline Brochure   . Bunionectomy bilateral  feet    . Wisdom tooth extraction    . Dental surgery    . Gum disease  2010    treated by Dr. Andree Elk     Current Outpatient Prescriptions  Medication Sig Dispense Refill  . acetaminophen (TYLENOL) 325 MG tablet One tablet once daily for arthritic pain, as needed (Patient taking differently: Take one tablet by mouth once daily as needed for arthritic pain) 30 tablet 5  . aspirin (ASPIRIN LOW DOSE) 81 MG EC tablet Take 81 mg by mouth daily.      . Calcium Carbonate-Vitamin D (CALTRATE 600+D) 600-400  MG-UNIT per tablet Take 1 tablet by mouth 2 (two) times daily.      Marland Kitchen lisinopril-hydrochlorothiazide (PRINZIDE,ZESTORETIC) 20-12.5 MG per tablet TAKE ONE TABLET BY MOUTH EVERY DAY 30 tablet 5  . multivitamin (THERAGRAN) per tablet Take 1 tablet by mouth daily.      . VOLTAREN 1 % GEL APPLY TOPICALLY TWICE DAILY TO AFFECTED JOINTS FOR PAIN AND SWELLING, AS NEEDED. (Patient taking differently: APPLY TOPICALLY TWICE DAILY AS NEEDED TO AFFECTED JOINTS FOR PAIN AND SWELLING.) 100 g 0   No current facility-administered medications for this visit.    Allergies  Allergen Reactions  . Fosamax [Alendronate Sodium] Other (See Comments)    Pt reports excessive urination on the day she took the pill    History   Social History  . Marital Status: Widowed    Spouse Name: N/A    Number of Children: 3  . Years of Education: college    Occupational History  . retired Chemical engineer     Social History Main Topics  . Smoking status: Former Research scientist (life sciences)  . Smokeless tobacco: Not on file     Comment: smoked for 15 years-not smoked since 1998  .  Alcohol Use: No  . Drug Use: No  . Sexual Activity: Not Currently   Other Topics Concern  . Not on file   Social History Narrative    Family History  Problem Relation Age of Onset  . Colon cancer Father   . Cancer Mother   . Stroke Mother   . Hypertension Brother     Review of Systems:  As stated in the HPI and otherwise negative.   BP 128/66 mmHg  Pulse 101  Ht 5\' 7"  (1.702 m)  Wt 200 lb (90.719 kg)  BMI 31.32 kg/m2  Physical Examination: General: Well developed, well nourished, NAD HEENT: OP clear, mucus membranes moist SKIN: warm, dry. No rashes. Neuro: No focal deficits Musculoskeletal: Muscle strength 5/5 all ext Psychiatric: Mood and affect normal Neck: No JVD, no carotid bruits, no thyromegaly, no lymphadenopathy. Lungs:Clear bilaterally, no wheezes, rhonci, crackles Cardiovascular: Regular rate and rhythm with ectopy. No murmurs,  gallops or rubs. Abdomen:Soft. Bowel sounds present. Non-tender.  Extremities: No lower extremity edema. Pulses are trace to 1 + in the bilateral DP/PT.  EKG: sinus with rate 101 bpm. PACs.   Assessment and Plan:   1. PAD: Stable. No changes. Will repeat ABI when she gets back from trip to [place]. I will see her in one year. She will let us know if her clinical status changes.   2. PACs: Sinus on EKG with PACs. No palpitations.

## 2014-09-29 NOTE — Assessment & Plan Note (Signed)
Annual exam as documented. Counseling done  re healthy lifestyle involving commitment to regular  exercise as able, heart healthy diet, and attaining healthy weight.The importance of adequate sleep also discussed. Regular seat belt use and home safety, is also discussed. C Immunization and cancer screening needs are specifically addressed at this visit.

## 2014-09-29 NOTE — Assessment & Plan Note (Signed)
Vaccine administered at visit.  

## 2014-10-02 ENCOUNTER — Other Ambulatory Visit: Payer: Self-pay | Admitting: Family Medicine

## 2014-10-23 ENCOUNTER — Telehealth (HOSPITAL_COMMUNITY): Payer: Self-pay

## 2014-10-23 NOTE — Telephone Encounter (Signed)
1/6 pt is still out of town and asked to call back middle of the week 1/11

## 2014-11-05 LAB — HM DIABETES EYE EXAM

## 2014-12-26 ENCOUNTER — Other Ambulatory Visit: Payer: Self-pay

## 2014-12-26 MED ORDER — LISINOPRIL-HYDROCHLOROTHIAZIDE 20-12.5 MG PO TABS: ORAL_TABLET | ORAL | Status: DC

## 2015-01-11 LAB — HEMOGLOBIN A1C
Hgb A1c MFr Bld: 6.7 % — ABNORMAL HIGH (ref <5.7)
Mean Plasma Glucose: 146 mg/dL — ABNORMAL HIGH (ref <117)

## 2015-01-11 LAB — COMPLETE METABOLIC PANEL WITH GFR
ALT: 26 U/L (ref 0–35)
AST: 32 U/L (ref 0–37)
Albumin: 3.7 g/dL (ref 3.5–5.2)
Alkaline Phosphatase: 83 U/L (ref 39–117)
BUN: 26 mg/dL — ABNORMAL HIGH (ref 6–23)
CO2: 26 mEq/L (ref 19–32)
Calcium: 8.8 mg/dL (ref 8.4–10.5)
Chloride: 103 mEq/L (ref 96–112)
Creat: 1.28 mg/dL — ABNORMAL HIGH (ref 0.50–1.10)
GFR, Est African American: 46 mL/min — ABNORMAL LOW
GFR, Est Non African American: 40 mL/min — ABNORMAL LOW
Glucose, Bld: 86 mg/dL (ref 70–99)
Potassium: 4.2 mEq/L (ref 3.5–5.3)
Sodium: 140 mEq/L (ref 135–145)
Total Bilirubin: 0.5 mg/dL (ref 0.2–1.2)
Total Protein: 6.2 g/dL (ref 6.0–8.3)

## 2015-01-11 LAB — CBC WITH DIFFERENTIAL/PLATELET
Basophils Absolute: 0 10*3/uL (ref 0.0–0.1)
Basophils Relative: 0 % (ref 0–1)
EOS PCT: 3 % (ref 0–5)
Eosinophils Absolute: 0.1 10*3/uL (ref 0.0–0.7)
HCT: 38.5 % (ref 36.0–46.0)
Hemoglobin: 12.7 g/dL (ref 12.0–15.0)
LYMPHS PCT: 43 % (ref 12–46)
Lymphs Abs: 1.8 10*3/uL (ref 0.7–4.0)
MCH: 31 pg (ref 26.0–34.0)
MCHC: 33 g/dL (ref 30.0–36.0)
MCV: 93.9 fL (ref 78.0–100.0)
MPV: 11.3 fL (ref 9.4–12.4)
Monocytes Absolute: 0.4 10*3/uL (ref 0.1–1.0)
Monocytes Relative: 9 % (ref 3–12)
NEUTROS ABS: 1.9 10*3/uL (ref 1.7–7.7)
Neutrophils Relative %: 45 % (ref 43–77)
Platelets: 234 10*3/uL (ref 150–400)
RBC: 4.1 MIL/uL (ref 3.87–5.11)
RDW: 14.3 % (ref 11.5–15.5)
WBC: 4.2 10*3/uL (ref 4.0–10.5)

## 2015-01-11 LAB — TSH: TSH: 1.596 u[IU]/mL (ref 0.350–4.500)

## 2015-01-11 LAB — LIPID PANEL
CHOLESTEROL: 158 mg/dL (ref 0–200)
HDL: 72 mg/dL (ref >=46)
LDL CALC: 77 mg/dL (ref 0–99)
TRIGLYCERIDES: 47 mg/dL (ref <150)
Total CHOL/HDL Ratio: 2.2 Ratio
VLDL: 9 mg/dL (ref 0–40)

## 2015-01-15 ENCOUNTER — Encounter: Payer: Self-pay | Admitting: Family Medicine

## 2015-01-15 ENCOUNTER — Ambulatory Visit (INDEPENDENT_AMBULATORY_CARE_PROVIDER_SITE_OTHER): Payer: Medicare Other | Admitting: Family Medicine

## 2015-01-15 VITALS — BP 122/78 | HR 94 | Resp 16 | Ht 67.0 in | Wt 193.0 lb

## 2015-01-15 DIAGNOSIS — N183 Chronic kidney disease, stage 3 unspecified: Secondary | ICD-10-CM | POA: Insufficient documentation

## 2015-01-15 DIAGNOSIS — E119 Type 2 diabetes mellitus without complications: Secondary | ICD-10-CM

## 2015-01-15 DIAGNOSIS — M1711 Unilateral primary osteoarthritis, right knee: Secondary | ICD-10-CM

## 2015-01-15 DIAGNOSIS — I10 Essential (primary) hypertension: Secondary | ICD-10-CM

## 2015-01-15 NOTE — Patient Instructions (Signed)
Annual wellness in 4 monh, call if you need me before  Your labs and exam are excellent  Keep up healthy lifestyle   You are referred to kidney specialist for evaluation, they will call you with appt

## 2015-01-23 NOTE — Progress Notes (Signed)
[name]     MRN: [id number]      DOB: 24-Jul-1934   HPI [name] is here for follow up and re-evaluation of chronic medical conditions, medication management and review of any available recent lab and radiology data.  Preventive health is updated, specifically  Cancer screening and Immunization.   Questions or concerns regarding consultations or procedures which the PT has had in the interim are  addressed. The PT denies any adverse reactions to current medications since the last visit.  There are no new concerns.  There are no specific complaints   ROS Denies recent fever or chills. Denies sinus pressure, nasal congestion, ear pain or sore throat. Denies chest congestion, productive cough or wheezing. Denies chest pains, palpitations and leg swelling Denies abdominal pain, nausea, vomiting,diarrhea or constipation.   Denies dysuria, frequency, hesitancy or incontinence. Chronic  joint pain, swelling and limitation in mobility. Denies headaches, seizures, numbness, or tingling. Denies depression, anxiety or insomnia. Denies skin break down or rash.   PE  BP 122/78 mmHg  Pulse 94  Resp 16  Ht 5\' 7"  (1.702 m)  Wt 193 lb (87.544 kg)  BMI 30.22 kg/m2  SpO2 98%  Patient alert and oriented and in no cardiopulmonary distress.  HEENT: No facial asymmetry, EOMI,   oropharynx pink and moist.  Neck supple no JVD, no mass.  Chest: Clear to auscultation bilaterally.  CVS: S1, S2 no murmurs, no S3.Regular rate.  ABD: Soft non tender.   Ext: No edema  MS: decreased  ROM spine, shoulders, hips and knees.  Skin: Intact, no ulcerations or rash noted.  Psych: Good eye contact, normal affect. Memory intact not anxious or depressed appearing.  CNS: CN 2-12 intact, power,  normal throughout.no focal deficits noted.   Assessment & Plan   Essential hypertension Controlled, no change in medication DASH diet and commitment to daily physical activity for a minimum of 30  minutes discussed and encouraged, as a part of hypertension management. The importance of attaining a healthy weight is also discussed.  BP/Weight 01/15/2015 09/26/2014 09/24/2014 05/03/2014 11/21/2013 07/11/2013 2/72/5366  Systolic BP 440 347 425 956 387 564 332  Diastolic BP 78 66 82 76 78 69 78  Wt. (Lbs) 193 200 200 200 201.8 190 190.4  BMI 30.22 31.32 32.3 32.3 32.59 30.68 30.75    CMP Latest Ref Rng 01/10/2015 09/20/2014 05/01/2014  Glucose 70 - 99 mg/dL 86 81 82  BUN 6 - 23 mg/dL 26(H) 26(H) 23  Creatinine 0.50 - 1.10 mg/dL 1.28(H) 1.29(H) 1.27(H)  Sodium 135 - 145 mEq/L 140 144 141  Potassium 3.5 - 5.3 mEq/L 4.2 4.3 4.3  Chloride 96 - 112 mEq/L 103 109 105  CO2 19 - 32 mEq/L 26 30 27   Calcium 8.4 - 10.5 mg/dL 8.8 9.8 9.5  Total Protein 6.0 - 8.3 g/dL 6.2 - -  Total Bilirubin 0.2 - 1.2 mg/dL 0.5 - -  Alkaline Phos 39 - 117 U/L 83 - -  AST 0 - 37 U/L 32 - -  ALT 0 - 35 U/L 26 - -        Type 2 diabetes, diet controlled Controlled, no change in management. Patient educated about the importance of limiting  Carbohydrate intake , the need to commit to daily physical activity for a minimum of 30 minutes , and to commit weight loss. The fact that changes in all these areas will reduce or eliminate all together the development of diabetes is stressed.  Diabetic Labs Latest Ref Rng 01/10/2015 09/20/2014 05/07/2014 05/01/2014 11/20/2013  HbA1c <5.7 % 6.7(H) 6.8(H) - 6.7(H) 6.4(H)  Microalbumin 0.00 - 1.89 mg/dL - - 0.50 - -  Micro/Creat Ratio 0.0 - 30.0 mg/g - - 3.3 - -  Chol 0 - 200 mg/dL 158 - - - 154  HDL >=46 mg/dL 72 - - - 72  Calc LDL 0 - 99 mg/dL 77 - - - 73  Triglycerides <150 mg/dL 47 - - - 43  Creatinine 0.50 - 1.10 mg/dL 1.28(H) 1.29(H) - 1.27(H) 1.18(H)   BP/Weight 01/15/2015 09/26/2014 09/24/2014 05/03/2014 11/21/2013 07/11/2013 2/42/6834  Systolic BP 196 222 979 892 119 417 408  Diastolic BP 78 66 82 76 78 69 78  Wt. (Lbs) 193 200 200 200 201.8 190 190.4  BMI 30.22 31.32 32.3  32.3 32.59 30.68 30.75   Foot/eye exam completion dates 05/03/2014 03/03/2010  Eye Exam - -  Foot exam Order - yes  Foot Form Completion Done -        Osteoarthritis of right knee Chronic stiffness and deformity, however no falls, ambulates with assistive device. No interest in surgery at this time Fall risk and home safety discussed and encouraged, pt remains very cautions when moving around and remains as independent as possible   CKD (chronic kidney disease) stage 3, GFR 30-59 ml/min Longstanding h/o hypertension, and pt also has diet controlled diabetes. Nephrology toe valuate and follow

## 2015-01-23 NOTE — Assessment & Plan Note (Signed)
Controlled, no change in medication DASH diet and commitment to daily physical activity for a minimum of 30 minutes discussed and encouraged, as a part of hypertension management. The importance of attaining a healthy weight is also discussed.  BP/Weight 01/15/2015 09/26/2014 09/24/2014 05/03/2014 11/21/2013 07/11/2013 03/16/510  Systolic BP 021 117 356 701 410 301 314  Diastolic BP 78 66 82 76 78 69 78  Wt. (Lbs) 193 200 200 200 201.8 190 190.4  BMI 30.22 31.32 32.3 32.3 32.59 30.68 30.75    CMP Latest Ref Rng 01/10/2015 09/20/2014 05/01/2014  Glucose 70 - 99 mg/dL 86 81 82  BUN 6 - 23 mg/dL 26(H) 26(H) 23  Creatinine 0.50 - 1.10 mg/dL 1.28(H) 1.29(H) 1.27(H)  Sodium 135 - 145 mEq/L 140 144 141  Potassium 3.5 - 5.3 mEq/L 4.2 4.3 4.3  Chloride 96 - 112 mEq/L 103 109 105  CO2 19 - 32 mEq/L 26 30 27   Calcium 8.4 - 10.5 mg/dL 8.8 9.8 9.5  Total Protein 6.0 - 8.3 g/dL 6.2 - -  Total Bilirubin 0.2 - 1.2 mg/dL 0.5 - -  Alkaline Phos 39 - 117 U/L 83 - -  AST 0 - 37 U/L 32 - -  ALT 0 - 35 U/L 26 - -

## 2015-01-23 NOTE — Assessment & Plan Note (Signed)
Chronic stiffness and deformity, however no falls, ambulates with assistive device. No interest in surgery at this time Fall risk and home safety discussed and encouraged, pt remains very cautions when moving around and remains as independent as possible

## 2015-01-23 NOTE — Assessment & Plan Note (Signed)
Controlled, no change in management. Patient educated about the importance of limiting  Carbohydrate intake , the need to commit to daily physical activity for a minimum of 30 minutes , and to commit weight loss. The fact that changes in all these areas will reduce or eliminate all together the development of diabetes is stressed.   Diabetic Labs Latest Ref Rng 01/10/2015 09/20/2014 05/07/2014 05/01/2014 11/20/2013  HbA1c <5.7 % 6.7(H) 6.8(H) - 6.7(H) 6.4(H)  Microalbumin 0.00 - 1.89 mg/dL - - 0.50 - -  Micro/Creat Ratio 0.0 - 30.0 mg/g - - 3.3 - -  Chol 0 - 200 mg/dL 158 - - - 154  HDL >=46 mg/dL 72 - - - 72  Calc LDL 0 - 99 mg/dL 77 - - - 73  Triglycerides <150 mg/dL 47 - - - 43  Creatinine 0.50 - 1.10 mg/dL 1.28(H) 1.29(H) - 1.27(H) 1.18(H)   BP/Weight 01/15/2015 09/26/2014 09/24/2014 05/03/2014 11/21/2013 07/11/2013 06/11/538  Systolic BP 767 341 937 902 409 735 329  Diastolic BP 78 66 82 76 78 69 78  Wt. (Lbs) 193 200 200 200 201.8 190 190.4  BMI 30.22 31.32 32.3 32.3 32.59 30.68 30.75   Foot/eye exam completion dates 05/03/2014 03/03/2010  Eye Exam - -  Foot exam Order - yes  Foot Form Completion Done -

## 2015-01-23 NOTE — Assessment & Plan Note (Signed)
Longstanding h/o hypertension, and pt also has diet controlled diabetes. Nephrology toe valuate and follow

## 2015-03-31 ENCOUNTER — Other Ambulatory Visit (HOSPITAL_COMMUNITY): Payer: Self-pay | Admitting: Nephrology

## 2015-03-31 DIAGNOSIS — N183 Chronic kidney disease, stage 3 unspecified: Secondary | ICD-10-CM

## 2015-04-16 ENCOUNTER — Ambulatory Visit (HOSPITAL_COMMUNITY)
Admission: RE | Admit: 2015-04-16 | Discharge: 2015-04-16 | Disposition: A | Discharge status: Home / Self Care | Payer: Medicare Other | Source: Ambulatory Visit | Attending: Nephrology | Admitting: Nephrology

## 2015-04-16 DIAGNOSIS — E1151 Type 2 diabetes mellitus with diabetic peripheral angiopathy without gangrene: Secondary | ICD-10-CM | POA: Diagnosis not present

## 2015-04-16 DIAGNOSIS — N183 Chronic kidney disease, stage 3 unspecified: Secondary | ICD-10-CM

## 2015-04-16 DIAGNOSIS — N281 Cyst of kidney, acquired: Secondary | ICD-10-CM | POA: Insufficient documentation

## 2015-04-16 DIAGNOSIS — E1122 Type 2 diabetes mellitus with diabetic chronic kidney disease: Secondary | ICD-10-CM | POA: Diagnosis not present

## 2015-05-05 ENCOUNTER — Telehealth: Payer: Self-pay

## 2015-05-05 NOTE — Telephone Encounter (Signed)
Patient will call back with any further problems

## 2015-06-09 ENCOUNTER — Other Ambulatory Visit: Payer: Self-pay

## 2015-06-09 DIAGNOSIS — I10 Essential (primary) hypertension: Secondary | ICD-10-CM

## 2015-06-09 DIAGNOSIS — E119 Type 2 diabetes mellitus without complications: Secondary | ICD-10-CM

## 2015-06-09 DIAGNOSIS — Z13220 Encounter for screening for lipoid disorders: Secondary | ICD-10-CM

## 2015-06-10 LAB — LIPID PANEL
Cholesterol: 149 mg/dL (ref 125–200)
HDL: 64 mg/dL (ref >=46)
LDL CALC: 75 mg/dL (ref <130)
Total CHOL/HDL Ratio: 2.3 Ratio (ref <=5.0)
Triglycerides: 51 mg/dL (ref <150)
VLDL: 10 mg/dL (ref <30)

## 2015-06-10 LAB — HEMOGLOBIN A1C
HEMOGLOBIN A1C: 6.4 % — AB (ref <5.7)
MEAN PLASMA GLUCOSE: 137 mg/dL — AB (ref <117)

## 2015-06-11 ENCOUNTER — Encounter: Payer: Self-pay | Admitting: Family Medicine

## 2015-06-11 ENCOUNTER — Ambulatory Visit (INDEPENDENT_AMBULATORY_CARE_PROVIDER_SITE_OTHER): Payer: Medicare Other | Admitting: Family Medicine

## 2015-06-11 ENCOUNTER — Encounter: Payer: Medicare Other | Admitting: Family Medicine

## 2015-06-11 VITALS — BP 136/78 | HR 86 | Resp 18 | Ht 67.0 in | Wt 199.0 lb

## 2015-06-11 DIAGNOSIS — E119 Type 2 diabetes mellitus without complications: Secondary | ICD-10-CM

## 2015-06-11 DIAGNOSIS — Z0000 Encounter for general adult medical examination without abnormal findings: Secondary | ICD-10-CM | POA: Insufficient documentation

## 2015-06-11 DIAGNOSIS — Z1231 Encounter for screening mammogram for malignant neoplasm of breast: Secondary | ICD-10-CM | POA: Diagnosis not present

## 2015-06-11 LAB — COMPLETE METABOLIC PANEL WITH GFR
ALK PHOS: 72 U/L (ref 33–130)
ALT: 20 U/L (ref 6–29)
AST: 29 U/L (ref 10–35)
Albumin: 3.6 g/dL (ref 3.6–5.1)
BUN: 24 mg/dL (ref 7–25)
CHLORIDE: 110 mmol/L (ref 98–110)
CO2: 25 mmol/L (ref 20–31)
Calcium: 9.1 mg/dL (ref 8.6–10.4)
Creat: 1.22 mg/dL — ABNORMAL HIGH (ref 0.60–0.93)
GFR, EST NON AFRICAN AMERICAN: 43 mL/min — AB (ref >=60)
GFR, Est African American: 49 mL/min — ABNORMAL LOW (ref >=60)
Glucose, Bld: 97 mg/dL (ref 65–99)
POTASSIUM: 4.2 mmol/L (ref 3.5–5.3)
Sodium: 146 mmol/L (ref 135–146)
Total Bilirubin: 0.6 mg/dL (ref 0.2–1.2)
Total Protein: 6 g/dL — ABNORMAL LOW (ref 6.1–8.1)

## 2015-06-11 NOTE — Progress Notes (Signed)
Preventive Screening-Counseling & Management   Patient present here today for a Medicare annual wellness visit.   Current Problems (verified)   Medications Prior to Visit Allergies (verified)   PAST HISTORY  Family History (updated)  Social History Widowed mother of 3; retired from childcare work    Risk Factors  Current exercise habits: Works in yard;  Rides stationary bike  Dietary issues discussed:  Heart healthy diet encouraged    Cardiac risk factors:   Depression Screen  (Note: if answer to either of the following is "Yes", a more complete depression screening is indicated)   Over the past two weeks, have you felt down, depressed or hopeless? No  Over the past two weeks, have you felt little interest or pleasure in doing things? No  Have you lost interest or pleasure in daily life? No  Do you often feel hopeless? No  Do you cry easily over simple problems? No   Activities of Daily Living  In your present state of health, do you have any difficulty performing the following activities?  Driving?: No Managing money?: No Feeding yourself?:No Getting from bed to chair?: Yes due to mobility Climbing a flight of stairs?: Yes due to mobility and use of assistive device Preparing food and eating?:No Bathing or showering?:No Getting dressed?:No Getting to the toilet?:No Using the toilet?:No Moving around from place to place?:  Yes, due to assistive device use  Fall Risk Assessment In the past year have you fallen or had a near fall?: Yes, one Are you currently taking any medications that make you dizzy?:No   Hearing Difficulties: No Do you often ask people to speak up or repeat themselves?:No Do you experience ringing or noises in your ears?:No Do you have difficulty understanding soft or whispered voices?:No  Cognitive Testing  Alert? Yes Normal Appearance?Yes  Oriented to person? Yes Place? Yes  Time? Yes  Displays appropriate judgment?Yes  Can read the  correct time from a watch face? yes Are you having problems remembering things?No age appropriate   Advanced Directives have been discussed with the patient? Yes and brochure given    List the Names of Other Physician/Practitioners you currently use: careteams updated    Indicate any recent Medical Services you may have received from other than [facility] providers in the past year (date may be approximate).   Assessment:    Annual Wellness Exam   Plan:    Medicare Attestation  I have personally reviewed:  The patient's medical and social history  Their use of alcohol, tobacco or illicit drugs  Their current medications and supplements  The patient's functional ability including ADLs,fall risks, home safety risks, cognitive, and hearing and visual impairment  Diet and physical activities  Evidence for depression or mood disorders  The patient's weight, height, BMI, and visual acuity have been recorded in the chart. I have made referrals, counseling, and provided education to the patient based on review of the above and I have provided the patient with a written personalized care plan for preventive services.   BP 136/78 mmHg  Pulse 86  Resp 18  Ht 5\' 7"  (1.702 m)  Wt 199 lb (90.266 kg)  BMI 31.16 kg/m2  SpO2 97%   Medicare annual wellness visit, subsequent Annual exam as documented. Counseling done  re healthy lifestyle involving commitment to 150 minutes exercise per week, heart healthy diet, and attaining healthy weight.The importance of adequate sleep also discussed. Regular seat belt use and home safety, is also discussed. Changes in health  habits are decided on by the patient with goals and time frames  set for achieving them. Immunization and cancer screening needs are specifically addressed at this visit.

## 2015-06-11 NOTE — Assessment & Plan Note (Signed)

## 2015-06-11 NOTE — Patient Instructions (Signed)
Annual physical exam mid December, call if you need me before  Sign for eye exam  You are referred for mammogram  PLS  return for flu vaccine in  September  Excellent labs and level of function  Please work on end of life issues as discussed  Keep an open mind as far as knee replacement is concerned, if it buckles too much you may fall  Vitamin D3  800 TO 1000 iU one daIly RECOMMENDED BY YOUR KIDNEY dOC  Please work on good  health habits so that your health will improve. 1. Commitment to daily physical activity for 30 to 60  minutes, if you are able to do this.  2. Commitment to wise food choices. Aim for half of your  food intake to be vegetable and fruit, one quarter starchy foods, and one quarter protein. Try to eat on a regular schedule  3 meals per day, snacking between meals should be limited to vegetables or fruits or small portions of nuts. 64 ounces of water per day is generally recommended, unless you have specific health conditions, like heart failure or kidney failure where you will need to limit fluid intake.  3. Commitment to sufficient and a  good quality of physical and mental rest daily, generally between 6 to 8 hours per day.  Thanks for choosing [facility], we consider it a privelige to serve you.   WITH PERSISTANCE AND PERSEVERANCE, THE IMPOSSIBLE , BECOMES THE NORM!

## 2015-06-12 LAB — MICROALBUMIN / CREATININE URINE RATIO
Creatinine, Urine: 115.1 mg/dL
MICROALB UR: 1 mg/dL (ref <2.0)
Microalb Creat Ratio: 8.7 mg/g (ref 0.0–30.0)

## 2015-06-16 ENCOUNTER — Other Ambulatory Visit: Payer: Self-pay | Admitting: Family Medicine

## 2015-06-16 DIAGNOSIS — Z1231 Encounter for screening mammogram for malignant neoplasm of breast: Secondary | ICD-10-CM

## 2015-07-18 ENCOUNTER — Ambulatory Visit (HOSPITAL_COMMUNITY)
Admission: RE | Admit: 2015-07-18 | Discharge: 2015-07-18 | Disposition: A | Discharge status: Home / Self Care | Payer: Medicare Other | Source: Ambulatory Visit | Attending: Family Medicine | Admitting: Family Medicine

## 2015-07-18 DIAGNOSIS — Z1231 Encounter for screening mammogram for malignant neoplasm of breast: Secondary | ICD-10-CM | POA: Diagnosis not present

## 2015-08-12 ENCOUNTER — Ambulatory Visit: Payer: Medicare Other

## 2015-08-12 DIAGNOSIS — Z23 Encounter for immunization: Secondary | ICD-10-CM

## 2015-09-17 ENCOUNTER — Telehealth: Payer: Self-pay | Admitting: Family Medicine

## 2015-09-17 NOTE — Telephone Encounter (Signed)
[name] the son of [name] is home right now for Thanksgiving and he as noticed some concerns that he would like to discuss with Dr. Moshe Cipro or Make Dr. Moshe Cipro aware of

## 2015-09-18 NOTE — Telephone Encounter (Signed)
I spoke to Ms. Read she states that it will be fine to speak to her son [name], she is aware that he is calling regarding her medical condition.

## 2015-09-18 NOTE — Telephone Encounter (Signed)
Appears feeble this past weekend , spent 4 to 5 days with her since one day he saw her weak lying in bed and had stool incontinence   Requests PT for lower and upper body strengthening, will refer  Will have Office staff call and sched  appt for pt next week, he is aware  PLS sche appt for pt next week , eval weakness

## 2015-09-29 NOTE — Telephone Encounter (Signed)
Referral was sent to Physical Therapy

## 2015-10-02 ENCOUNTER — Encounter: Payer: Medicare Other | Admitting: Family Medicine

## 2015-10-02 ENCOUNTER — Encounter: Payer: Self-pay | Admitting: Cardiovascular Disease

## 2015-10-02 ENCOUNTER — Ambulatory Visit (INDEPENDENT_AMBULATORY_CARE_PROVIDER_SITE_OTHER): Payer: Medicare Other | Admitting: Cardiovascular Disease

## 2015-10-02 VITALS — BP 110/80 | HR 99 | Ht 66.5 in | Wt 191.6 lb

## 2015-10-02 DIAGNOSIS — R600 Localized edema: Secondary | ICD-10-CM | POA: Diagnosis not present

## 2015-10-02 DIAGNOSIS — I739 Peripheral vascular disease, unspecified: Secondary | ICD-10-CM

## 2015-10-02 NOTE — Patient Instructions (Signed)
Medication Instructions:  The current medical regimen is effective;  continue present plan and medications.  Testing/Procedures: Your physician has requested that you have a lower extremity venous duplex. This test is an ultrasound of the veins in the legs or arms. It looks at venous blood flow that carries blood from the heart to the legs or arms. Allow one hour for a Lower Venous exam. Allow thirty minutes for an Upper Venous exam. There are no restrictions or special instructions.  Your physician has requested that you have a lower extremity arterial duplex. This test is an ultrasound of the arteries in the legs or arms. It looks at arterial blood flow in the legs and arms. Allow one hour for Lower and Upper Arterial scans. There are no restrictions or special instructions  Follow-Up: Follow up in 6 months with Dr. Angelena Form.  You will receive a letter in the mail 2 months before you are due.  Please call us when you receive this letter to schedule your follow up appointment.  If you need a refill on your cardiac medications before your next appointment, please call your pharmacy.  Thank you for choosing [facility]!!

## 2015-10-02 NOTE — Progress Notes (Signed)
Chief Complaint  Patient presents with  . Follow-up    PAD     History of Present Illness: 81 yo AAF with history of PAD, HTN, DM and OA who is here today for PV follow up. I saw her as a new patient in 2011 for PV evaluation. She underwent segmental pressure study in May 2011 which suggested left lower extremity occlusive disease. ABI was normal on the right and reduced mildly at 0.77 on the left. She had no claudication, rest pain or ulcerations. She has chronic edema in the left leg with signs of venous stasis. She had her left knee replaced in 2011. Last lower extremity doppler studies October 2014 with ABI 0.92 right and 0.94 on left.    She is here for follow up. She notes pain in her left knee replacement. She has swelling in her left leg. This has been present for several days. She has no pain in her legs with walking. No calf pain. No chest pain or SOB.   Primary Care Physician: Tula Nakayama  Past Medical History  Diagnosis Date  . Osteopenia   . Arthritis of knee     bilateral    . Diabetes mellitus, type 2 ([place])   . Hypertension   . Personal history of colonic polyps 3 years ago     colonoscopy   . Chronic renal insufficiency   . PAD (peripheral artery disease) [facility])     Past Surgical History  Procedure Laterality Date  . Total abdominal hysterectomy      bleeding   . Left knee replacement  08/14/08    Dr. Aline Brochure   . Left knee manipulation  under anasthesia due to contracture -  03/08/08    Dr. Aline Brochure   . Bunionectomy bilateral  feet    . Wisdom tooth extraction    . Dental surgery    . Gum disease  2010    treated by Dr. Andree Elk     Current Outpatient Prescriptions  Medication Sig Dispense Refill  . acetaminophen (TYLENOL) 325 MG tablet One tablet once daily for arthritic pain, as needed (Patient taking differently: Take one tablet by mouth once daily as needed for arthritic pain) 30 tablet 5  . aspirin (ASPIRIN LOW DOSE) 81 MG EC tablet Take 81 mg by  mouth daily.      . Calcium Carbonate-Vitamin D (CALTRATE 600+D) 600-400 MG-UNIT per tablet Take 1 tablet by mouth 2 (two) times daily.      . diphenhydrAMINE (BENADRYL) 50 MG tablet Take 50 mg by mouth at bedtime as needed for itching or allergies.    Marland Kitchen lisinopril-hydrochlorothiazide (PRINZIDE,ZESTORETIC) 20-12.5 MG per tablet TAKE ONE TABLET BY MOUTH EVERY DAY 90 tablet 1  . multivitamin (THERAGRAN) per tablet Take 1 tablet by mouth daily.      . VOLTAREN 1 % GEL APPLY TOPICALLY TWICE DAILY TO AFFECTED JOINTS FOR PAIN AND SWELLING, AS NEEDED. (Patient taking differently: APPLY TOPICALLY TWICE DAILY AS NEEDED TO AFFECTED JOINTS FOR PAIN AND SWELLING.) 100 g 0   No current facility-administered medications for this visit.    Allergies  Allergen Reactions  . Fosamax [Alendronate Sodium] Other (See Comments)    Pt reports excessive urination on the day she took the pill    Social History   Social History  . Marital Status: Widowed    Spouse Name: N/A  . Number of Children: 3  . Years of Education: college    Occupational History  . retired Chemical engineer  Social History Main Topics  . Smoking status: Former Research scientist (life sciences)  . Smokeless tobacco: Not on file     Comment: smoked for 15 years-not smoked since 1998  . Alcohol Use: No  . Drug Use: No  . Sexual Activity: Not Currently   Other Topics Concern  . Not on file   Social History Narrative    Family History  Problem Relation Age of Onset  . Colon cancer Father   . Cancer Mother   . Stroke Mother   . Hypertension Brother     Review of Systems:  As stated in the HPI and otherwise negative.   BP 110/80 mmHg  Pulse 99  Ht 5' 6.5" (1.689 m)  Wt 191 lb 9.6 oz (86.909 kg)  BMI 30.47 kg/m2  SpO2 98%  Physical Examination: General: Well developed, well nourished, NAD HEENT: OP clear, mucus membranes moist SKIN: warm, dry. No rashes. Neuro: No focal deficits Musculoskeletal: Muscle strength 5/5 all ext Psychiatric:  Mood and affect normal Neck: No JVD, no carotid bruits, no thyromegaly, no lymphadenopathy. Lungs:Clear bilaterally, no wheezes, rhonci, crackles Cardiovascular: Regular rate and rhythm with ectopy. No murmurs, gallops or rubs. Abdomen:Soft. Bowel sounds present. Non-tender.  Extremities: No lower extremity edema. Pulses are trace to 1 + in the bilateral DP/PT.  EKG:  EKG is ordered today. The ekg ordered today demonstrates Sinus, rate 99 bpm. PVC. Non-specific ST and T wave abnormaltiy  Recent Labs: 01/10/2015: Hemoglobin 12.7; Platelets 234; TSH 1.596 06/09/2015: ALT 20; BUN 24; Creat 1.22*; Potassium 4.2; Sodium 146   Lipid Panel    Component Value Date/Time   CHOL 149 06/09/2015 1118   TRIG 51 06/09/2015 1118   HDL 64 06/09/2015 1118   CHOLHDL 2.3 06/09/2015 1118   VLDL 10 06/09/2015 1118   LDLCALC 75 06/09/2015 1118     Wt Readings from Last 3 Encounters:  10/02/15 191 lb 9.6 oz (86.909 kg)  06/11/15 199 lb (90.266 kg)  01/15/15 193 lb (87.544 kg)     Other studies Reviewed: Additional studies/ records that were reviewed today include: . Review of the above records demonstrates:    Assessment and Plan:   1. PAD: Stable. She has pain in her right knee prosthesis. She has no calf pain or foot pain. She does have swelling of the left leg for the last several days. I will arrange venous dopplers left leg to exclude DVT. We will plan ABI as well to exclude progression PAD.    2. LE edema, left: venous doppler to exclude DVT.   Current medicines are reviewed at length with the patient today.  The patient does not have concerns regarding medicines.  The following changes have been made:  no change  Labs/ tests ordered today include:   Orders Placed This Encounter  Procedures  . EKG 12-Lead   Disposition:   FU with me in 6 months  Signed, Lauree Chandler, MD 10/02/2015 4:57 PM    [place], [place], [place]   [address] Phone: [phone]; Fax: [phone]

## 2015-10-03 ENCOUNTER — Ambulatory Visit (HOSPITAL_COMMUNITY)
Admission: RE | Admit: 2015-10-03 | Discharge: 2015-10-03 | Disposition: A | Discharge status: Home / Self Care | Payer: Medicare Other | Source: Ambulatory Visit | Attending: Cardiology | Admitting: Cardiology

## 2015-10-03 DIAGNOSIS — I129 Hypertensive chronic kidney disease with stage 1 through stage 4 chronic kidney disease, or unspecified chronic kidney disease: Secondary | ICD-10-CM | POA: Diagnosis not present

## 2015-10-03 DIAGNOSIS — N189 Chronic kidney disease, unspecified: Secondary | ICD-10-CM | POA: Diagnosis not present

## 2015-10-03 DIAGNOSIS — R600 Localized edema: Secondary | ICD-10-CM | POA: Diagnosis not present

## 2015-10-03 DIAGNOSIS — E119 Type 2 diabetes mellitus without complications: Secondary | ICD-10-CM | POA: Insufficient documentation

## 2015-10-17 NOTE — Addendum Note (Signed)
Addended by: Thompson Grayer on: 10/17/2015 03:10 PM   Modules accepted: Orders

## 2015-10-27 ENCOUNTER — Telehealth (HOSPITAL_COMMUNITY): Payer: Self-pay

## 2015-10-27 NOTE — Telephone Encounter (Signed)
Bad weather.

## 2015-10-28 ENCOUNTER — Ambulatory Visit (HOSPITAL_COMMUNITY): Payer: Medicare Other | Admitting: Physical Therapy

## 2015-10-29 ENCOUNTER — Inpatient Hospital Stay (HOSPITAL_COMMUNITY): Admission: RE | Admit: 2015-10-29 | Payer: Medicare Other | Source: Ambulatory Visit

## 2015-11-10 ENCOUNTER — Other Ambulatory Visit: Payer: Self-pay | Admitting: Cardiovascular Disease

## 2015-11-10 ENCOUNTER — Ambulatory Visit (HOSPITAL_COMMUNITY)
Admission: RE | Admit: 2015-11-10 | Discharge: 2015-11-10 | Disposition: A | Discharge status: Home / Self Care | Payer: Medicare Other | Source: Ambulatory Visit | Attending: Cardiovascular Disease | Admitting: Cardiovascular Disease

## 2015-11-10 DIAGNOSIS — I739 Peripheral vascular disease, unspecified: Secondary | ICD-10-CM | POA: Insufficient documentation

## 2015-11-10 DIAGNOSIS — E119 Type 2 diabetes mellitus without complications: Secondary | ICD-10-CM | POA: Insufficient documentation

## 2015-11-10 DIAGNOSIS — Z87891 Personal history of nicotine dependence: Secondary | ICD-10-CM | POA: Insufficient documentation

## 2015-11-10 DIAGNOSIS — I10 Essential (primary) hypertension: Secondary | ICD-10-CM | POA: Diagnosis not present

## 2015-11-11 ENCOUNTER — Ambulatory Visit (HOSPITAL_COMMUNITY): Payer: Medicare Other | Attending: Family Medicine | Admitting: Physical Therapy

## 2015-11-11 DIAGNOSIS — Z9181 History of falling: Secondary | ICD-10-CM | POA: Diagnosis present

## 2015-11-11 DIAGNOSIS — R269 Unspecified abnormalities of gait and mobility: Secondary | ICD-10-CM

## 2015-11-11 DIAGNOSIS — R2689 Other abnormalities of gait and mobility: Secondary | ICD-10-CM

## 2015-11-11 DIAGNOSIS — R29898 Other symptoms and signs involving the musculoskeletal system: Secondary | ICD-10-CM | POA: Diagnosis present

## 2015-11-11 NOTE — Therapy (Signed)
[place], [place], [address] Phone: [phone]   Fax:  [phone]  Physical Therapy Evaluation  Patient Details  Name: [name] MRN: [id number] Date of Birth: 1934/08/02 Referring Provider: Dr. Jeannie Fend   Encounter Date: 11/11/2015      PT End of Session - 11/11/15 1615    Visit Number 1   Number of Visits 12   Date for PT Re-Evaluation 12/11/15   Authorization Type [place] - Visit Number 1   Authorization - Number of Visits 10   PT Start Time 1520   PT Stop Time 1606   PT Time Calculation (min) 46 min   Activity Tolerance Patient tolerated treatment well   Behavior During Therapy [facility] for tasks assessed/performed      Past Medical History  Diagnosis Date  . Osteopenia   . Arthritis of knee     bilateral    . Diabetes mellitus, type 2 ([place])   . Hypertension   . Personal history of colonic polyps 3 years ago     colonoscopy   . Chronic renal insufficiency   . PAD (peripheral artery disease) [facility])     Past Surgical History  Procedure Laterality Date  . Total abdominal hysterectomy      bleeding   . Left knee replacement  08/14/08    Dr. Aline Brochure   . Left knee manipulation  under anasthesia due to contracture -  03/08/08    Dr. Aline Brochure   . Bunionectomy bilateral  feet    . Wisdom tooth extraction    . Dental surgery    . Gum disease  2010    treated by Dr. Andree Elk     There were no vitals filed for this visit.  Visit Diagnosis:  Abnormality of gait - Plan: PT plan of care cert/re-cert  Risk for falls - Plan: PT plan of care cert/re-cert  Leg weakness, bilateral - Plan: PT plan of care cert/re-cert  Poor balance - Plan: PT plan of care cert/re-cert      Subjective Assessment - 11/11/15 1610    Subjective [name] states that she has been having increased knee pain.  She ha her LT knee replaced in 2008 but she still has a lot of stiffness  in her  calf area.  She has had arterial tests which came out fine. She states she was moving better until Thanksgiving when she caught a virus.  She states that at one point she could not even get up without help from her family which has never happene before.  She states now when she is walking with the quad cane she walks close to the wall to make sure she doesn't fall.  She currently has increased difficulty getting up from a chair, and her walking has slowed down.  She has referred to PT to maximize her functioanl ability.    Pertinent History Lt TKR; OA; osteoporosis; HTN    How long can you sit comfortably? no difficulty; but if she has sat for a while she will have increased pain getting up.    How long can you stand comfortably? 15 minutes    How long can you walk comfortably? 10 minutes with he walker   Patient Stated Goals PT is using a walker whenever she has to walk more than 67minutes; She would like to be able to walk without the cane but rea;izes that this may not be possible  [facility] PT Assessment - 11/11/15 0001    Assessment   Medical Diagnosis abnormal gait    Referring Provider Dr. Tula Nakayama    Onset Date/Surgical Date 09/09/15   Hand Dominance Right   Next MD Visit 12/03/2015   Prior Therapy none for this procedure   Precautions   Precautions Fall   Restrictions   Weight Bearing Restrictions No   Balance Screen   Has the patient fallen in the past 6 months No   Has the patient had a decrease in activity level because of a fear of falling?  Yes   Is the patient reluctant to leave their home because of a fear of falling?  No   Home Environment   Living Environment Private residence   Home Access Ramped entrance   Prior Function   Level of Independence Independent with household mobility with device   Vocation Full time employment   Leisure quilting    Cognition   Overall Cognitive Status Within Functional Limits for tasks assessed   Observation/Other  Assessments   Focus on Therapeutic Outcomes (FOTO)  50   Functional Tests   Functional tests Single leg stance;Sit to Stand   Single Leg Stance   Comments unable    Sit to Stand   Comments unable without UE ; With UE 5 x 47.68    ROM / Strength   AROM / PROM / Strength AROM;Strength   AROM   AROM Assessment Site Knee   Right/Left Knee Left   Strength   Strength Assessment Site Hip;Knee;Ankle   Right/Left Hip Right;Left   Right Hip Extension 3/5   Right Hip ABduction 3+/5   Left Hip Extension 2+/5   Left Hip ABduction 3-/5   Right/Left Knee Right;Left   Right Knee Extension 3+/5   Left Knee Extension 3+/5   Right/Left Ankle Right;Left   Right Ankle Dorsiflexion 3/5   Left Ankle Dorsiflexion 3+/5   Standardized Balance Assessment   Standardized Balance Assessment Timed Up and Go Test   Timed Up and Go Test   Normal TUG (seconds) 56.78                           PT Education - 11/11/15 1614    Education provided Yes   Education Details HEP   Person(s) Educated Patient   Methods Explanation;Handout   Comprehension Verbalized understanding;Tactile cues required          PT Short Term Goals - 11/11/15 1627    PT SHORT TERM GOAL #1   Title Pt to be independent in HEP in order to obtain goals.    Time 1   Period Weeks   PT SHORT TERM GOAL #2   Title Pt TUG to be less than 40 seconds to demonstrate decreased risk of falling    Time 3   Period Weeks   PT SHORT TERM GOAL #3   Title Pt strength to be improved 1/2 grade to be able to come sit to stand while seated on higher chairs without UE assist  for improved mobility to decrease fall risk    Time 3   Period Weeks   PT SHORT TERM GOAL #4   Title Pt to be able to SLS x 5 seconds for reduced risk of falls    Time 3   Period Weeks   PT SHORT TERM GOAL #5   Title Pt to feel comforable walking with her quad cane away from the  wall for improved confidence in walking independently for improved quality of  life.    Time 3   Period Weeks           PT Long Term Goals - 2015/12/07 1630    PT LONG TERM GOAL #1   Title Pt to be independent in advance HEP in order to obtain goals   Time 6   Period Weeks   PT LONG TERM GOAL #2   Title Pt to be able to walk with her quad cane for 15 mintues without difficulty for improved health habits.    Time 6   Period Weeks   PT LONG TERM GOAL #3   Title PT TUG score to be less than 30 seconds with quad cane for reduced risk of falling    Time 6   Period Weeks   PT LONG TERM GOAL #4   Title Pt to demonstrate improved power by stating that she is able get up from her couch without difficulty.    Time 6   Period Weeks   PT LONG TERM GOAL #5   Title Pt  5 sit to stand test to be less than 20 seconds to demonstrate decreased fall riskl   Time 6   Period Weeks               Plan - 12/07/2015 1619    Clinical Impression Statement Ms. Diffin is a 82 yo female who states that she has had knee pain ever since she had a TKR in 2008 which causes her to ambulate with a quad cane or walkier at all times.  She became ill with a virus in November and became so weak that her family had to help her get up.  She has not totally recovered her strengh and functional ability therefore her physician referred her to skilled physical therapy.  Evaluation demonstrates decreased balance, decreased power, decreased  strength and an abnormal gait.  Ms. Budd will benefit from skilled PT to address these issues and maximize her functional ability to decrease her fall risk.    Pt will benefit from skilled therapeutic intervention in order to improve on the following deficits Abnormal gait;Decreased activity tolerance;Decreased balance;Decreased strength;Difficulty walking;Impaired flexibility;Postural dysfunction   Rehab Potential Good   PT Frequency 2x / week   PT Duration 6 weeks   PT Treatment/Interventions Patient/family education;Gait training;Stair training;Functional  mobility training;Therapeutic activities;Therapeutic exercise;Balance training   PT Next Visit Plan Gait train with quad cane encouraging longer and faster steps, heel raises, functional squats, SLS, Marching in place, tandem stance and sit to stand.  Progess to step ups and lunges    PT Home Exercise Plan given          G-Codes - 12-07-2015 1635    Functional Assessment Tool Used walking    Functional Limitation Mobility: Walking and moving around   Mobility: Walking and Moving Around Current Status [id number]) At least 40 percent but less than 60 percent impaired, limited or restricted   Mobility: Walking and Moving Around Goal Status [phone]) At least 20 percent but less than 40 percent impaired, limited or restricted       Problem List Patient Active Problem List   Diagnosis Date Noted  . Medicare annual wellness visit, subsequent 06/11/2015  . CKD (chronic kidney disease) stage 3, GFR 30-59 ml/min 01/15/2015  . Osteoarthritis of right knee 05/04/2014  . At high risk for falls 05/04/2014  . PVD (peripheral vascular disease) ([place]) 06/08/2011  . ABNORMAL  ELECTROCARDIOGRAM 09/19/2009  . Type 2 diabetes, diet controlled ([place]) 12/04/2007  . Essential hypertension 10/14/2006  . OSTEOARTHRITIS 10/14/2006  . OSTEOPENIA 10/14/2006  . COLONIC POLYPS, HX OF 10/14/2006    Rayetta Humphrey, PT CLT [phone] 11/11/2015, 4:39 PM  [facility] [address] [place], [place], [address] Phone: [phone]   Fax:  [phone]  Name: [name] MRN: [id number] Date of Birth: [birth year]

## 2015-11-11 NOTE — Patient Instructions (Signed)
Toe / Heel Raise (Sitting)    Sitting, raise heels, then rock back on heels and raise toes. Repeat _10___ times.  Copyright  VHI. All rights reserved.  Knee Extension (Sitting)    Place __0__ pound weight on left ankle and straighten knee fully, lower slowly. Repeat __10__ times per set. Do ___1_ sets per session. Do _2___ sessions per day.  http://orth.exer.us/732   Copyright  VHI. All rights reserved.  Straight Leg: with Bent Knee (Supine)    With right leg straight, other leg bent, raise straight leg _15___ inches. Repeat 10___ times per set. Do __1__ sets per session. Do _2___ sessions per day.  http://orth.exer.us/686   Copyright  VHI. All rights reserved.  Bridging    Slowly raise buttocks from floor, keeping stomach tight. Repeat _10___ times per set. Do ____ sets per session. Do __2__ sessions per day. 1 http://orth.exer.us/1096   Copyright  VHI. All rights reserved.  Strengthening: Hip Abduction (Side-Lying)    Tighten muscles on front of left thigh, then lift leg _15___ inches from surface, keeping knee locked.  Repeat _10___ times per set. Do __1__ sets per session. Do ___2_ sessions per day.  http://orth.exer.us/622   Copyright  VHI. All rights reserved.

## 2015-11-13 ENCOUNTER — Ambulatory Visit (HOSPITAL_COMMUNITY): Payer: Medicare Other | Admitting: Physical Therapy

## 2015-11-13 DIAGNOSIS — R269 Unspecified abnormalities of gait and mobility: Secondary | ICD-10-CM

## 2015-11-13 DIAGNOSIS — Z9181 History of falling: Secondary | ICD-10-CM

## 2015-11-13 DIAGNOSIS — R29898 Other symptoms and signs involving the musculoskeletal system: Secondary | ICD-10-CM

## 2015-11-13 DIAGNOSIS — R2689 Other abnormalities of gait and mobility: Secondary | ICD-10-CM

## 2015-11-13 NOTE — Therapy (Signed)
[place], [place], [address] Phone: [phone]   Fax:  [phone]  Physical Therapy Treatment  Patient Details  Name: [name] MRN: [id number] Date of Birth: April 10, 1934 Referring Provider: Dr. Jeannie Fend   Encounter Date: 11/13/2015      PT End of Session - 11/13/15 1206    Visit Number 2   Number of Visits 12   Date for PT Re-Evaluation 12/11/15   Authorization Type [employer] medicare   Authorization - Visit Number 2   Authorization - Number of Visits 10   PT Start Time 1025   PT Stop Time 1105   PT Time Calculation (min) 40 min   Activity Tolerance Patient tolerated treatment well   Behavior During Therapy [facility] for tasks assessed/performed      Past Medical History  Diagnosis Date  . Osteopenia   . Arthritis of knee     bilateral    . Diabetes mellitus, type 2 ([place])   . Hypertension   . Personal history of colonic polyps 3 years ago     colonoscopy   . Chronic renal insufficiency   . PAD (peripheral artery disease) [facility])     Past Surgical History  Procedure Laterality Date  . Total abdominal hysterectomy      bleeding   . Left knee replacement  08/14/08    Dr. Aline Brochure   . Left knee manipulation  under anasthesia due to contracture -  03/08/08    Dr. Aline Brochure   . Bunionectomy bilateral  feet    . Wisdom tooth extraction    . Dental surgery    . Gum disease  2010    treated by Dr. Andree Elk     There were no vitals filed for this visit.  Visit Diagnosis:  Abnormality of gait  Risk for falls  Leg weakness, bilateral  Poor balance      Subjective Assessment - 11/13/15 1155    Subjective PT showed 12 minutes late for appt.  Pt reports she has been focusing on improving her ambulation and completing her HEP.  States she did it 3X the past 2 days.    Currently in Pain? No/denies                         [facility] Adult PT Treatment/Exercise - 11/13/15 1158    Knee/Hip Exercises: Standing   Gait Training with QC X200 feet focus on increasing stride and gait velocity   Other Standing Knee Exercises tandem stance bilaterally 2X15" with bilateral HHA   Knee/Hip Exercises: Seated   Long Arc Quad Both;15 reps   Sit to [employer] 5 reps;without UE support  using 1 pillow in regular chair   Knee/Hip Exercises: Supine   Bridges 15 reps   Straight Leg Raises 15 reps;Both   Knee/Hip Exercises: Sidelying   Hip ABduction Both;15 reps                PT Education - 11/13/15 1217    Education provided Yes   Education Details HEP and initial evaluation    Person(s) Educated Patient   Methods Explanation;Demonstration;Handout   Comprehension Verbalized understanding;Returned demonstration;Tactile cues required          PT Short Term Goals - 11/11/15 1627    PT SHORT TERM GOAL #1   Title Pt to be independent in HEP in order to obtain goals.    Time 1   Period Weeks  PT SHORT TERM GOAL #2   Title Pt TUG to be less than 40 seconds to demonstrate decreased risk of falling    Time 3   Period Weeks   PT SHORT TERM GOAL #3   Title Pt strength to be improved 1/2 grade to be able to come sit to stand while seated on higher chairs without UE assist  for improved mobility to decrease fall risk    Time 3   Period Weeks   PT SHORT TERM GOAL #4   Title Pt to be able to SLS x 5 seconds for reduced risk of falls    Time 3   Period Weeks   PT SHORT TERM GOAL #5   Title Pt to feel comforable walking with her quad cane away from the wall for improved confidence in walking independently for improved quality of life.    Time 3   Period Weeks           PT Long Term Goals - 11/11/15 1630    PT LONG TERM GOAL #1   Title Pt to be independent in advance HEP in order to obtain goals   Time 6   Period Weeks   PT LONG TERM GOAL #2   Title Pt to be able to walk with her quad cane for 15 mintues without difficulty for improved health habits.    Time 6    Period Weeks   PT LONG TERM GOAL #3   Title PT TUG score to be less than 30 seconds with quad cane for reduced risk of falling    Time 6   Period Weeks   PT LONG TERM GOAL #4   Title Pt to demonstrate improved power by stating that she is able get up from her couch without difficulty.    Time 6   Period Weeks   PT LONG TERM GOAL #5   Title Pt  5 sit to stand test to be less than 20 seconds to demonstrate decreased fall riskl   Time 6   Period Weeks               Plan - 11/13/15 1206    Clinical Impression Statement Pt was late for scheduled appt today.  PT given copy of initial evaluation with measurements and goals explained. Review of HEP with therapist facilitation for form.  Pt unable to complete sit to stand from standard chair height without use of UE"s.  Began sit to stand with 1 pillow in chair to isolate LE's without UE assist.  Pt with extreme instability unable to complete tandem stance with bilateral HHA.  Pt was able to advance 1 LE forward and hold 15 seconds with BUE assist but was very rigid and fearful.  Improved gait stride and speed at end of session.    Pt will benefit from skilled therapeutic intervention in order to improve on the following deficits Abnormal gait;Decreased activity tolerance;Decreased balance;Decreased strength;Difficulty walking;Impaired flexibility;Postural dysfunction   Rehab Potential Good   PT Frequency 2x / week   PT Duration 6 weeks   PT Treatment/Interventions Patient/family education;Gait training;Stair training;Functional mobility training;Therapeutic activities;Therapeutic exercise;Balance training   PT Next Visit Plan Gait train with quad cane encouraging longer and faster steps.  Add standing heel raises, functional squats, SLS and  Marching in place.   Progess to step ups and lunges    PT Home Exercise Plan given        Problem List Patient Active Problem List   Diagnosis  Date Noted  . Medicare annual wellness visit,  subsequent 06/11/2015  . CKD (chronic kidney disease) stage 3, GFR 30-59 ml/min 01/15/2015  . Osteoarthritis of right knee 05/04/2014  . At high risk for falls 05/04/2014  . PVD (peripheral vascular disease) ([place]) 06/08/2011  . ABNORMAL ELECTROCARDIOGRAM 09/19/2009  . Type 2 diabetes, diet controlled ([place]) 12/04/2007  . Essential hypertension 10/14/2006  . OSTEOARTHRITIS 10/14/2006  . OSTEOPENIA 10/14/2006  . COLONIC POLYPS, HX OF 10/14/2006    Teena Irani, PTA/CLT [phone]  11/13/2015, 12:18 PM  [facility] [address] [place], [place], [address] Phone: [phone]   Fax:  [phone]  Name: TERISA WILLING MRN: [id number] Date of Birth: [birth year]

## 2015-11-17 ENCOUNTER — Telehealth: Payer: Self-pay | Admitting: Cardiovascular Disease

## 2015-11-17 NOTE — Telephone Encounter (Signed)
Returning your call. °

## 2015-11-17 NOTE — Telephone Encounter (Signed)
Spoke with pt and reviewed ABI results with her.  

## 2015-11-18 ENCOUNTER — Ambulatory Visit (HOSPITAL_COMMUNITY): Payer: Medicare Other | Admitting: Physical Therapy

## 2015-11-18 DIAGNOSIS — R2689 Other abnormalities of gait and mobility: Secondary | ICD-10-CM

## 2015-11-18 DIAGNOSIS — R29898 Other symptoms and signs involving the musculoskeletal system: Secondary | ICD-10-CM

## 2015-11-18 DIAGNOSIS — R269 Unspecified abnormalities of gait and mobility: Secondary | ICD-10-CM

## 2015-11-18 DIAGNOSIS — Z9181 History of falling: Secondary | ICD-10-CM

## 2015-11-18 NOTE — Therapy (Signed)
[place], [place], [address] Phone: [phone]   Fax:  [phone]  Physical Therapy Treatment  Patient Details  Name: [name] MRN: [id number] Date of Birth: [birth year] Referring Provider: Dr. Jeannie Fend   Encounter Date: 11/18/2015      PT End of Session - 11/18/15 1216    Visit Number 3   Number of Visits 12   Date for PT Re-Evaluation 12/11/15   Authorization Type [place] - Visit Number 3   Authorization - Number of Visits 10   PT Start Time 1104   PT Stop Time 1148   PT Time Calculation (min) 44 min   Activity Tolerance Patient tolerated treatment well   Behavior During Therapy [facility] for tasks assessed/performed      Past Medical History  Diagnosis Date  . Osteopenia   . Arthritis of knee     bilateral    . Diabetes mellitus, type 2 ([place])   . Hypertension   . Personal history of colonic polyps 3 years ago     colonoscopy   . Chronic renal insufficiency   . PAD (peripheral artery disease) [facility])     Past Surgical History  Procedure Laterality Date  . Total abdominal hysterectomy      bleeding   . Left knee replacement  08/14/08    Dr. Aline Brochure   . Left knee manipulation  under anasthesia due to contracture -  03/08/08    Dr. Aline Brochure   . Bunionectomy bilateral  feet    . Wisdom tooth extraction    . Dental surgery    . Gum disease  2010    treated by Dr. Andree Elk     There were no vitals filed for this visit.  Visit Diagnosis:  Abnormality of gait  Risk for falls  Leg weakness, bilateral  Poor balance      Subjective Assessment - 11/18/15 1116    Subjective PT comes today using QC.  States she has been working on her exercises and walking. No pain with overall improvement in her Lt knee since beginning therapy.   Currently in Pain? No/denies                         [facility] Adult PT Treatment/Exercise - 11/18/15 1116    Knee/Hip  Exercises: Standing   Heel Raises Both;10 reps   Heel Raises Limitations toeraises both 10 reps with UE assist   Hip Flexion Both;10 reps   Hip Flexion Limitations marching with 3" holds UE assist   Functional Squat 10 reps   Functional Squat Limitations UE assist   SLS bilateral Rt:3  Lt:3  max of 3 with 1HHA of therapist   Gait Training with QC X200 feet in 6 minutes max focus on increasing stride and gait velocity   Other Standing Knee Exercises tandem stance bilaterally 2X15" with bilateral HHA   Knee/Hip Exercises: Seated   Long Arc Quad Both;15 reps   Sit to [employer] 5 reps;without UE support                  PT Short Term Goals - 11/11/15 1627    PT SHORT TERM GOAL #1   Title Pt to be independent in HEP in order to obtain goals.    Time 1   Period Weeks   PT SHORT TERM GOAL #2   Title Pt TUG to be less than 40  seconds to demonstrate decreased risk of falling    Time 3   Period Weeks   PT SHORT TERM GOAL #3   Title Pt strength to be improved 1/2 grade to be able to come sit to stand while seated on higher chairs without UE assist  for improved mobility to decrease fall risk    Time 3   Period Weeks   PT SHORT TERM GOAL #4   Title Pt to be able to SLS x 5 seconds for reduced risk of falls    Time 3   Period Weeks   PT SHORT TERM GOAL #5   Title Pt to feel comforable walking with her quad cane away from the wall for improved confidence in walking independently for improved quality of life.    Time 3   Period Weeks           PT Long Term Goals - 11/11/15 1630    PT LONG TERM GOAL #1   Title Pt to be independent in advance HEP in order to obtain goals   Time 6   Period Weeks   PT LONG TERM GOAL #2   Title Pt to be able to walk with her quad cane for 15 mintues without difficulty for improved health habits.    Time 6   Period Weeks   PT LONG TERM GOAL #3   Title PT TUG score to be less than 30 seconds with quad cane for reduced risk of falling    Time 6    Period Weeks   PT LONG TERM GOAL #4   Title Pt to demonstrate improved power by stating that she is able get up from her couch without difficulty.    Time 6   Period Weeks   PT LONG TERM GOAL #5   Title Pt  5 sit to stand test to be less than 20 seconds to demonstrate decreased fall riskl   Time 6   Period Weeks               Plan - 11/18/15 1217    Clinical Impression Statement Pt with improved stability and confidence with QC.  Able to ambulate 200 feet in 6 minutes with QC and without reaching for the wall or LOB.  improved functional strength and endurance noted today during session overall. Advanced standing exercises today including heelraises/toeraises, squats, marching and SLS.  Pt required at least 1 UE assist to complete all exercises.  Pt with more weakness in Lt LE with inability performing in full ROM.   Pt will benefit from skilled therapeutic intervention in order to improve on the following deficits Abnormal gait;Decreased activity tolerance;Decreased balance;Decreased strength;Difficulty walking;Impaired flexibility;Postural dysfunction   Rehab Potential Good   PT Frequency 2x / week   PT Duration 6 weeks   PT Treatment/Interventions Patient/family education;Gait training;Stair training;Functional mobility training;Therapeutic activities;Therapeutic exercise;Balance training   PT Next Visit Plan Gait train with quad cane encouraging longer and faster steps.   Progess to step ups and lunges next session.  Continue to encourage decreasing fear with balance activities.    PT Home Exercise Plan given        Problem List Patient Active Problem List   Diagnosis Date Noted  . Medicare annual wellness visit, subsequent 06/11/2015  . CKD (chronic kidney disease) stage 3, GFR 30-59 ml/min 01/15/2015  . Osteoarthritis of right knee 05/04/2014  . At high risk for falls 05/04/2014  . PVD (peripheral vascular disease) ([place]) 06/08/2011  . ABNORMAL  ELECTROCARDIOGRAM  09/19/2009  . Type 2 diabetes, diet controlled ([place]) 12/04/2007  . Essential hypertension 10/14/2006  . OSTEOARTHRITIS 10/14/2006  . OSTEOPENIA 10/14/2006  . COLONIC POLYPS, HX OF 10/14/2006    Teena Irani, PTA/CLT [phone] 11/18/2015, 12:26 PM  [place] [address] [place], [place], [address] Phone: [phone]   Fax:  [phone]  Name: [name] MRN: [id number] Date of Birth: [birth year]

## 2015-11-19 ENCOUNTER — Ambulatory Visit (HOSPITAL_COMMUNITY): Payer: Medicare Other | Attending: Family Medicine

## 2015-11-19 ENCOUNTER — Telehealth (HOSPITAL_COMMUNITY): Payer: Self-pay

## 2015-11-19 DIAGNOSIS — R269 Unspecified abnormalities of gait and mobility: Secondary | ICD-10-CM | POA: Insufficient documentation

## 2015-11-19 DIAGNOSIS — R29898 Other symptoms and signs involving the musculoskeletal system: Secondary | ICD-10-CM | POA: Insufficient documentation

## 2015-11-19 DIAGNOSIS — R2689 Other abnormalities of gait and mobility: Secondary | ICD-10-CM | POA: Insufficient documentation

## 2015-11-19 DIAGNOSIS — Z9181 History of falling: Secondary | ICD-10-CM | POA: Insufficient documentation

## 2015-11-19 NOTE — Telephone Encounter (Signed)
No show, called and spoke to pt. who thought apt. scheduled for Thursday.  Pt informed next apt date and time as well as contact info.  [address] [place]; [facility] [phone]

## 2015-11-25 ENCOUNTER — Encounter: Payer: Self-pay | Admitting: Family Medicine

## 2015-11-25 ENCOUNTER — Ambulatory Visit (HOSPITAL_COMMUNITY): Payer: Medicare Other

## 2015-11-25 ENCOUNTER — Ambulatory Visit (INDEPENDENT_AMBULATORY_CARE_PROVIDER_SITE_OTHER): Payer: Medicare Other | Admitting: Family Medicine

## 2015-11-25 VITALS — BP 120/78 | HR 86 | Resp 16 | Ht 66.0 in | Wt 186.0 lb

## 2015-11-25 DIAGNOSIS — I10 Essential (primary) hypertension: Secondary | ICD-10-CM | POA: Diagnosis not present

## 2015-11-25 DIAGNOSIS — Z1211 Encounter for screening for malignant neoplasm of colon: Secondary | ICD-10-CM

## 2015-11-25 DIAGNOSIS — E119 Type 2 diabetes mellitus without complications: Secondary | ICD-10-CM

## 2015-11-25 DIAGNOSIS — Z0000 Encounter for general adult medical examination without abnormal findings: Secondary | ICD-10-CM | POA: Diagnosis not present

## 2015-11-25 DIAGNOSIS — Z9181 History of falling: Secondary | ICD-10-CM

## 2015-11-25 DIAGNOSIS — R2689 Other abnormalities of gait and mobility: Secondary | ICD-10-CM

## 2015-11-25 DIAGNOSIS — R269 Unspecified abnormalities of gait and mobility: Secondary | ICD-10-CM

## 2015-11-25 DIAGNOSIS — R29898 Other symptoms and signs involving the musculoskeletal system: Secondary | ICD-10-CM | POA: Diagnosis present

## 2015-11-25 LAB — POC HEMOCCULT BLD/STL (OFFICE/1-CARD/DIAGNOSTIC): FECAL OCCULT BLD: NEGATIVE

## 2015-11-25 NOTE — Progress Notes (Signed)
   Subjective:    Patient ID: [name], female    DOB: 1934-02-19, 82 y.o.   MRN: [id number]  HPI  Patient is in for annual physical exam. No other health concerns are expressed or addressed at the visit. . Immunization is reviewed , and  updated if needed.   Review of Systems See HPI     Objective:   Physical Exam  BP 120/78 mmHg  Pulse 86  Resp 16  Ht 5\' 6"  (1.676 m)  Wt 186 lb (84.369 kg)  BMI 30.04 kg/m2  SpO2 97% Pleasant well nourished elderly  female, alert and oriented x 3, in no cardio-pulmonary distress. Afebrile. HEENT No facial trauma or asymetry. Sinuses non tender.  Extra occullar muscles intact, . External ears normal, tympanic membranes clear. Oropharynx moist, no exudate, fair dentition. Neck: adequate though reduced ROM, no adenopathy,JVD or thyromegaly.No bruits.  Chest: Clear to ascultation bilaterally.No crackles or wheezes. Non tender to palpation  Breast: No asymetry,no masses or lumps. No tenderness. No nipple discharge or inversion. No axillary or supraclavicular adenopathy  Cardiovascular system; Heart sounds normal,  S1 and  S2 ,no S3.  No murmur, or thrill.Irregular rate Apical beat not displaced Peripheral pulses normal.  Abdomen: Soft, non tender, no organomegaly or masses. No bruits. Bowel sounds normal. No guarding, tenderness or rebound.  Rectal:  Normal sphincter tone. No mass.No rectal masses.  Guaiac negative stool.  GU: External genitalia normal female genitalia , female distribution of hair. No lesions. Urethral meatus normal in size, no  Prolapse, no lesions visibly  Present. Bladder non tender. Vagina pink and moist , with no visible lesions , discharge present . Adequate pelvic support no  cystocele or rectocele noted  Uterus absent no adnexal masses, no  adnexal tenderness.   Musculoskeletal exam: Decreased ROM of spine, hips , shoulders and knees.  No muscle wasting or atrophy.    Neurologic: Cranial nerves 2 to 12 intact. Power, tone ,sensation and reflexes normal throughout. No tremor. Abnormal gait, ambulates with a cane.  Skin: Intact, no ulceration, erythema , scaling or rash noted. Pigmentation normal throughout  Psych; Normal mood and affect. Judgement and concentration normal       Assessment & Plan:  Annual physical exam Annual exam as documented. Counseling done  re healthy lifestyle involving commitment to daily exercise , heart healthy diet, and maintaining a healthy weight.The importance of adequate sleep also discussed. Regular seat belt use and home safety, is also discussed.  Immunization and cancer screening needs are specifically addressed at this visit. ++

## 2015-11-25 NOTE — Therapy (Signed)
[place], [place], [address] Phone: [phone]   Fax:  [phone]  Physical Therapy Treatment  Patient Details  Name: [name] MRN: [id number] Date of Birth: November 08, 1933 Referring Provider: Dr. Jeannie Fend   Encounter Date: 11/25/2015      PT End of Session - 11/25/15 1113    Visit Number 4   Number of Visits 12   Date for PT Re-Evaluation 12/11/15   Authorization Type [place] - Visit Number 4   Authorization - Number of Visits 10   PT Start Time [id number]   PT Stop Time 1100   PT Time Calculation (min) 42 min   Equipment Utilized During Treatment Gait belt   Activity Tolerance Patient tolerated treatment well   Behavior During Therapy [facility] for tasks assessed/performed      Past Medical History  Diagnosis Date  . Osteopenia   . Arthritis of knee     bilateral    . Diabetes mellitus, type 2 ([place])   . Hypertension   . Personal history of colonic polyps 3 years ago     colonoscopy   . Chronic renal insufficiency   . PAD (peripheral artery disease) [facility])     Past Surgical History  Procedure Laterality Date  . Total abdominal hysterectomy      bleeding   . Left knee replacement  08/14/08    Dr. Aline Brochure   . Left knee manipulation  under anasthesia due to contracture -  03/08/08    Dr. Aline Brochure   . Bunionectomy bilateral  feet    . Wisdom tooth extraction    . Dental surgery    . Gum disease  2010    treated by Dr. Andree Elk     There were no vitals filed for this visit.  Visit Diagnosis:  Abnormality of gait  Risk for falls  Poor balance  Leg weakness, bilateral      Subjective Assessment - 11/25/15 1033    Subjective Pt reports good results from testing earlier at [facility], no reports of pain today   Pertinent History Lt TKR; OA; osteoporosis; HTN    Patient Stated Goals PT is using a walker whenever she has to walk more than 1minutes; She would like to be able  to walk without the cane but rea;izes that this may not be possible    Currently in Pain? No/denies           [facility] Adult PT Treatment/Exercise - 11/25/15 0001    Knee/Hip Exercises: Standing   Heel Raises Both;10 reps   Heel Raises Limitations toeraises both 10 reps with UE assist   Hip Flexion Both;10 reps   Hip Flexion Limitations marching with 3" holds UE assist   Forward Lunges Both;15 reps   Forward Lunges Limitations 4in step   Side Lunges 10 reps   Side Lunges Limitations 4in step   Functional Squat 10 reps   Functional Squat Limitations UE assist   Rocker Board 1 minute   Rocker Board Limitations R/L and A/P   Gait Training QC x 292feet in6 min with max cueing for increased stride length and heel to toe   Other Standing Knee Exercises tandem stance BIl 3x 30" wtih 1 UE   Knee/Hip Exercises: Seated   Sit to Sand 5 reps;without UE support              PT Short Term Goals - 11/11/15 1627    PT  SHORT TERM GOAL #1   Title Pt to be independent in HEP in order to obtain goals.    Time 1   Period Weeks   PT SHORT TERM GOAL #2   Title Pt TUG to be less than 40 seconds to demonstrate decreased risk of falling    Time 3   Period Weeks   PT SHORT TERM GOAL #3   Title Pt strength to be improved 1/2 grade to be able to come sit to stand while seated on higher chairs without UE assist  for improved mobility to decrease fall risk    Time 3   Period Weeks   PT SHORT TERM GOAL #4   Title Pt to be able to SLS x 5 seconds for reduced risk of falls    Time 3   Period Weeks   PT SHORT TERM GOAL #5   Title Pt to feel comforable walking with her quad cane away from the wall for improved confidence in walking independently for improved quality of life.    Time 3   Period Weeks           PT Long Term Goals - 11/11/15 1630    PT LONG TERM GOAL #1   Title Pt to be independent in advance HEP in order to obtain goals   Time 6   Period Weeks   PT LONG TERM GOAL #2   Title  Pt to be able to walk with her quad cane for 15 mintues without difficulty for improved health habits.    Time 6   Period Weeks   PT LONG TERM GOAL #3   Title PT TUG score to be less than 30 seconds with quad cane for reduced risk of falling    Time 6   Period Weeks   PT LONG TERM GOAL #4   Title Pt to demonstrate improved power by stating that she is able get up from her couch without difficulty.    Time 6   Period Weeks   PT LONG TERM GOAL #5   Title Pt  5 sit to stand test to be less than 20 seconds to demonstrate decreased fall riskl   Time 6   Period Weeks               Plan - 11/25/15 1113    Clinical Impression Statement Session focus on improving gait mechanics, balance training and LE strengthening.  Max cueing required for heel to toe pattern and to increase stride length with gait training with QC to improve mechanics.  Pt demontrated increased fear of falling with reduced stride lengths during gait and reports of increase fear during balance activities without HHA.  Pt demonstrated increased weakness Lt LE with in ability to perform full ROM with standing exercises.  Added rocker board to improve weight distribution and lunges for LE strengthening.  No reports of pain through session, was limited by fatigue with activitie.   PT Next Visit Plan Gait train with quad cane encouraging longer and faster steps.   Progess to step ups and lunges next session.  Continue to encourage decreasing fear with balance activities.         Problem List Patient Active Problem List   Diagnosis Date Noted  . Medicare annual wellness visit, subsequent 06/11/2015  . CKD (chronic kidney disease) stage 3, GFR 30-59 ml/min 01/15/2015  . Osteoarthritis of right knee 05/04/2014  . At high risk for falls 05/04/2014  . PVD (peripheral vascular disease) (  [place]) 06/08/2011  . ABNORMAL ELECTROCARDIOGRAM 09/19/2009  . Type 2 diabetes, diet controlled ([place]) 12/04/2007  . Essential hypertension  10/14/2006  . OSTEOARTHRITIS 10/14/2006  . OSTEOPENIA 10/14/2006  . COLONIC POLYPS, HX OF 10/14/2006   Ihor Austin, LPTA; [place]  Aldona Lento 11/25/2015, 11:40 AM  [place], [place], [address] Phone: [phone]   Fax:  [phone]  Name: [name] MRN: [id number] Date of Birth: 1933-11-28

## 2015-11-25 NOTE — Assessment & Plan Note (Signed)
Annual exam as documented. Counseling done  re healthy lifestyle involving commitment to daily exercise , heart healthy diet, and maintaining a healthy weight.The importance of adequate sleep also discussed. Regular seat belt use and home safety, is also discussed.  Immunization and cancer screening needs are specifically addressed at this visit. ++

## 2015-11-25 NOTE — Patient Instructions (Addendum)
Annual wellness 8/25 or after, call if you need me sooner  Labs today HBA1C, cmp and EGFr  Fasting lipid, CBC, HBa1C, cmp and EGFR, TSH  And Vit D end August , call in July for lab sheet  Make  Sure that you eat regularly , no need to lose any more weight!   Careful not to fall, continue physical therapy  Look forward to you joining [facility]!

## 2015-11-26 LAB — COMPLETE METABOLIC PANEL WITH GFR
ALK PHOS: 69 U/L (ref 33–130)
ALT: 15 U/L (ref 6–29)
AST: 33 U/L (ref 10–35)
Albumin: 3.8 g/dL (ref 3.6–5.1)
BUN: 24 mg/dL (ref 7–25)
CHLORIDE: 107 mmol/L (ref 98–110)
CO2: 26 mmol/L (ref 20–31)
Calcium: 9 mg/dL (ref 8.6–10.4)
Creat: 1.36 mg/dL — ABNORMAL HIGH (ref 0.60–0.93)
GFR, EST NON AFRICAN AMERICAN: 37 mL/min — AB (ref >=60)
GFR, Est African American: 43 mL/min — ABNORMAL LOW (ref >=60)
GLUCOSE: 92 mg/dL (ref 65–99)
POTASSIUM: 4.2 mmol/L (ref 3.5–5.3)
SODIUM: 144 mmol/L (ref 135–146)
Total Bilirubin: 0.6 mg/dL (ref 0.2–1.2)
Total Protein: 6.7 g/dL (ref 6.1–8.1)

## 2015-11-26 LAB — HEMOGLOBIN A1C
Hgb A1c MFr Bld: 6.2 % — ABNORMAL HIGH (ref <5.7)
Mean Plasma Glucose: 131 mg/dL — ABNORMAL HIGH (ref <117)

## 2015-11-27 ENCOUNTER — Ambulatory Visit (HOSPITAL_COMMUNITY): Payer: Medicare Other | Admitting: Physical Therapy

## 2015-11-27 DIAGNOSIS — R2689 Other abnormalities of gait and mobility: Secondary | ICD-10-CM

## 2015-11-27 DIAGNOSIS — R269 Unspecified abnormalities of gait and mobility: Secondary | ICD-10-CM

## 2015-11-27 DIAGNOSIS — Z9181 History of falling: Secondary | ICD-10-CM

## 2015-11-27 DIAGNOSIS — R29898 Other symptoms and signs involving the musculoskeletal system: Secondary | ICD-10-CM

## 2015-11-27 NOTE — Therapy (Signed)
[place], [place], [address] Phone: [phone]   Fax:  [phone]  Physical Therapy Treatment  Patient Details  Name: [name] MRN: [id number] Date of Birth: Apr 11, 1934 Referring Provider: Dr. Jeannie Fend   Encounter Date: 11/27/2015      PT End of Session - 11/27/15 1423    Visit Number 5   Number of Visits 12   Date for PT Re-Evaluation 12/11/15   Authorization Type [employer] medicare   Authorization - Visit Number 5   Authorization - Number of Visits 10   PT Start Time [id number]   PT Stop Time 1427   PT Time Calculation (min) 40 min   Equipment Utilized During Treatment Gait belt   Activity Tolerance Patient tolerated treatment well      Past Medical History  Diagnosis Date  . Osteopenia   . Arthritis of knee     bilateral    . Diabetes mellitus, type 2 ([place])   . Hypertension   . Personal history of colonic polyps 3 years ago     colonoscopy   . Chronic renal insufficiency   . PAD (peripheral artery disease) [facility])     Past Surgical History  Procedure Laterality Date  . Total abdominal hysterectomy      bleeding   . Left knee replacement  08/14/08    Dr. Aline Brochure   . Left knee manipulation  under anasthesia due to contracture -  03/08/08    Dr. Aline Brochure   . Bunionectomy bilateral  feet    . Wisdom tooth extraction    . Dental surgery    . Gum disease  2010    treated by Dr. Andree Elk     There were no vitals filed for this visit.  Visit Diagnosis:  Abnormality of gait  Risk for falls  Poor balance  Leg weakness, bilateral      Subjective Assessment - 11/27/15 1350    Subjective PT states she has been doing her exercises but has no questions.  Pt states that she can tell that she can walk longer and she has less pain in her knee.    Currently in Pain? No/denies                         [facility] Adult PT Treatment/Exercise - 11/27/15 0001    Knee/Hip Exercises:  Standing   Heel Raises Both;10 reps   Heel Raises Limitations UE assist    Other Standing Knee Exercises side step at mat x 2 round trips    Other Standing Knee Exercises scapular retractionx 10; lumbar extension x 10    Knee/Hip Exercises: Seated   Sit to Sand 10 reps             Balance Exercises - 11/27/15 1414    Balance Exercises: Standing   Standing Eyes Opened Narrow base of support (BOS);2 reps;30 secs   Tandem Stance Eyes open;Upper extremity support 1;3 reps;10 secs   Marching Limitations x 10    Heel Raises Limitations sitting x 10   Toe Raise Limitations sitting x 10             PT Short Term Goals - 11/11/15 1627    PT SHORT TERM GOAL #1   Title Pt to be independent in HEP in order to obtain goals.    Time 1   Period Weeks   PT SHORT TERM GOAL #2   Title  Pt TUG to be less than 40 seconds to demonstrate decreased risk of falling    Time 3   Period Weeks   PT SHORT TERM GOAL #3   Title Pt strength to be improved 1/2 grade to be able to come sit to stand while seated on higher chairs without UE assist  for improved mobility to decrease fall risk    Time 3   Period Weeks   PT SHORT TERM GOAL #4   Title Pt to be able to SLS x 5 seconds for reduced risk of falls    Time 3   Period Weeks   PT SHORT TERM GOAL #5   Title Pt to feel comforable walking with her quad cane away from the wall for improved confidence in walking independently for improved quality of life.    Time 3   Period Weeks           PT Long Term Goals - 11/11/15 1630    PT LONG TERM GOAL #1   Title Pt to be independent in advance HEP in order to obtain goals   Time 6   Period Weeks   PT LONG TERM GOAL #2   Title Pt to be able to walk with her quad cane for 15 mintues without difficulty for improved health habits.    Time 6   Period Weeks   PT LONG TERM GOAL #3   Title PT TUG score to be less than 30 seconds with quad cane for reduced risk of falling    Time 6   Period Weeks    PT LONG TERM GOAL #4   Title Pt to demonstrate improved power by stating that she is able get up from her couch without difficulty.    Time 6   Period Weeks   PT LONG TERM GOAL #5   Title Pt  5 sit to stand test to be less than 20 seconds to demonstrate decreased fall riskl   Time 6   Period Weeks               Plan - 11/27/15 1425    Clinical Impression Statement Pt tends to lose balance with center of graviity going behind and to the left of her base of support.  Pt verbally cued to use pelvic area to correct which she is able to do with minimal assist.  Pt ambulates with forward flexted postion and flat footed.  Pt given exercise to attempt to corrrect.    PT Next Visit Plan Gait train with quad cane encouraging longer and faster steps.    Continue to work on balance to  decrease her fear with balance activities.    PT Home Exercise Plan given        Problem List Patient Active Problem List   Diagnosis Date Noted  . Annual physical exam 11/25/2015  . CKD (chronic kidney disease) stage 3, GFR 30-59 ml/min 01/15/2015  . Osteoarthritis of right knee 05/04/2014  . At high risk for falls 05/04/2014  . PVD (peripheral vascular disease) ([place]) 06/08/2011  . ABNORMAL ELECTROCARDIOGRAM 09/19/2009  . Type 2 diabetes, diet controlled ([place]) 12/04/2007  . Essential hypertension 10/14/2006  . OSTEOARTHRITIS 10/14/2006  . OSTEOPENIA 10/14/2006  . COLONIC POLYPS, HX OF 10/14/2006    Rayetta Humphrey, PT CLT [phone] 11/27/2015, 2:30 PM  [place] [address] [place], [place], [address] Phone: [phone]   Fax:  [phone]  Name: [name] MRN: [id number]  Date of Birth: 03/04/1934

## 2015-12-02 ENCOUNTER — Ambulatory Visit (HOSPITAL_COMMUNITY): Payer: Medicare Other | Admitting: Physical Therapy

## 2015-12-02 DIAGNOSIS — Z9181 History of falling: Secondary | ICD-10-CM

## 2015-12-02 DIAGNOSIS — R2689 Other abnormalities of gait and mobility: Secondary | ICD-10-CM

## 2015-12-02 DIAGNOSIS — R269 Unspecified abnormalities of gait and mobility: Secondary | ICD-10-CM | POA: Diagnosis not present

## 2015-12-02 DIAGNOSIS — R29898 Other symptoms and signs involving the musculoskeletal system: Secondary | ICD-10-CM

## 2015-12-02 NOTE — Therapy (Signed)
[place], [place], [address] Phone: [phone]   Fax:  [phone]  Physical Therapy Treatment  Patient Details  Name: [name] MRN: [id number] Date of Birth: 09-22-1934 Referring Provider: Dr. Jeannie Fend   Encounter Date: 12/02/2015      PT End of Session - 12/02/15 1844    Visit Number 6   Number of Visits 12   Date for PT Re-Evaluation 12/11/15   Authorization Type [place] - Visit Number 6   Authorization - Number of Visits 10   PT Start Time [id number]   PT Stop Time 1150   PT Time Calculation (min) 45 min   Equipment Utilized During Treatment Gait belt   Activity Tolerance Patient tolerated treatment well   Behavior During Therapy [facility] for tasks assessed/performed      Past Medical History  Diagnosis Date  . Osteopenia   . Arthritis of knee     bilateral    . Diabetes mellitus, type 2 ([place])   . Hypertension   . Personal history of colonic polyps 3 years ago     colonoscopy   . Chronic renal insufficiency   . PAD (peripheral artery disease) [facility])     Past Surgical History  Procedure Laterality Date  . Total abdominal hysterectomy      bleeding   . Left knee replacement  08/14/08    Dr. Aline Brochure   . Left knee manipulation  under anasthesia due to contracture -  03/08/08    Dr. Aline Brochure   . Bunionectomy bilateral  feet    . Wisdom tooth extraction    . Dental surgery    . Gum disease  2010    treated by Dr. Andree Elk     There were no vitals filed for this visit.  Visit Diagnosis:  Abnormality of gait  Risk for falls  Poor balance  Leg weakness, bilateral      Subjective Assessment - 12/02/15 1129    Subjective Pt states her Lt knee is still hurting her quite a bit at 5/10.  States she is waiting for an appointment to get another shot in it.  States she does not have any back pain.   Currently in Pain? Yes   Pain Score 5    Pain Location Knee   Pain  Orientation Left   Pain Descriptors / Indicators Throbbing   Aggravating Factors  rest; states it wakes her in the morning                         [facility] Adult PT Treatment/Exercise - 12/02/15 1840    Knee/Hip Exercises: Standing   Heel Raises Both;10 reps   Heel Raises Limitations UE assist    Gait Training QC x 200 wuith max cueing for increased stride length and heel to toe   Other Standing Knee Exercises side step at mat x 2 round trips    Other Standing Knee Exercises scapular retractionx 10; lumbar extension x 10    Knee/Hip Exercises: Seated   Sit to Sand 10 reps             Balance Exercises - 12/02/15 1841    Balance Exercises: Standing   Tandem Stance Eyes open;Upper extremity support 1;3 reps;10 secs   Sidestepping 2 reps  with QC   Marching Limitations x 15              PT  Short Term Goals - 11/11/15 1627    PT SHORT TERM GOAL #1   Title Pt to be independent in HEP in order to obtain goals.    Time 1   Period Weeks   PT SHORT TERM GOAL #2   Title Pt TUG to be less than 40 seconds to demonstrate decreased risk of falling    Time 3   Period Weeks   PT SHORT TERM GOAL #3   Title Pt strength to be improved 1/2 grade to be able to come sit to stand while seated on higher chairs without UE assist  for improved mobility to decrease fall risk    Time 3   Period Weeks   PT SHORT TERM GOAL #4   Title Pt to be able to SLS x 5 seconds for reduced risk of falls    Time 3   Period Weeks   PT SHORT TERM GOAL #5   Title Pt to feel comforable walking with her quad cane away from the wall for improved confidence in walking independently for improved quality of life.    Time 3   Period Weeks           PT Long Term Goals - 11/11/15 1630    PT LONG TERM GOAL #1   Title Pt to be independent in advance HEP in order to obtain goals   Time 6   Period Weeks   PT LONG TERM GOAL #2   Title Pt to be able to walk with her quad cane for 15 mintues  without difficulty for improved health habits.    Time 6   Period Weeks   PT LONG TERM GOAL #3   Title PT TUG score to be less than 30 seconds with quad cane for reduced risk of falling    Time 6   Period Weeks   PT LONG TERM GOAL #4   Title Pt to demonstrate improved power by stating that she is able get up from her couch without difficulty.    Time 6   Period Weeks   PT LONG TERM GOAL #5   Title Pt  5 sit to stand test to be less than 20 seconds to demonstrate decreased fall riskl   Time 6   Period Weeks               Plan - 12/02/15 1845    Clinical Impression Statement PT with overall good stability during session today.  PRogressed to sidestepping out on line but with use of QC.  Also progreseed to theraband postural therex to increase strength and challenge BOS.  Noted improveement with gait with increased stride.  Pt compliant with HEP.   PT Next Visit Plan Gait train with quad cane encouraging longer and faster steps.    Continue to work on balance to  decrease her fear with balance activities.    PT Home Exercise Plan given        Problem List Patient Active Problem List   Diagnosis Date Noted  . Annual physical exam 11/25/2015  . CKD (chronic kidney disease) stage 3, GFR 30-59 ml/min 01/15/2015  . Osteoarthritis of right knee 05/04/2014  . At high risk for falls 05/04/2014  . PVD (peripheral vascular disease) ([place]) 06/08/2011  . ABNORMAL ELECTROCARDIOGRAM 09/19/2009  . Type 2 diabetes, diet controlled ([place]) 12/04/2007  . Essential hypertension 10/14/2006  . OSTEOARTHRITIS 10/14/2006  . OSTEOPENIA 10/14/2006  . COLONIC POLYPS, HX OF 10/14/2006    Donterrius Santucci B  Mare Ferrari, PTA/CLT [phone]  12/02/2015, 6:48 PM  [place] [address] [place], [place], [address] Phone: [phone]   Fax:  [phone]  Name: [name] MRN: [id number] Date of Birth: [birth year]

## 2015-12-04 ENCOUNTER — Ambulatory Visit (HOSPITAL_COMMUNITY): Payer: Medicare Other | Admitting: Physical Therapy

## 2015-12-04 DIAGNOSIS — R2689 Other abnormalities of gait and mobility: Secondary | ICD-10-CM

## 2015-12-04 DIAGNOSIS — R29898 Other symptoms and signs involving the musculoskeletal system: Secondary | ICD-10-CM

## 2015-12-04 DIAGNOSIS — Z9181 History of falling: Secondary | ICD-10-CM

## 2015-12-04 DIAGNOSIS — R269 Unspecified abnormalities of gait and mobility: Secondary | ICD-10-CM | POA: Diagnosis not present

## 2015-12-04 NOTE — Therapy (Signed)
[place], [place], [address] Phone: [phone]   Fax:  [phone]  Physical Therapy Treatment  Patient Details  Name: [name] MRN: [id number] Date of Birth: 02-23-1934 Referring Provider: Dr. Jeannie Fend   Encounter Date: 12/04/2015      PT End of Session - 12/04/15 1338    Visit Number 7   Number of Visits 12   Date for PT Re-Evaluation 12/11/15   Authorization Type [place] - Visit Number 7   Authorization - Number of Visits 10   PT Start Time [id number]   PT Stop Time [id number]  pt assisted off nustep by Lenora Boys at end of session   PT Time Calculation (min) 50 min   Equipment Utilized During Treatment Gait belt   Activity Tolerance Patient tolerated treatment well   Behavior During Therapy [facility] for tasks assessed/performed      Past Medical History  Diagnosis Date  . Osteopenia   . Arthritis of knee     bilateral    . Diabetes mellitus, type 2 ([place])   . Hypertension   . Personal history of colonic polyps 3 years ago     colonoscopy   . Chronic renal insufficiency   . PAD (peripheral artery disease) [facility])     Past Surgical History  Procedure Laterality Date  . Total abdominal hysterectomy      bleeding   . Left knee replacement  08/14/08    Dr. Aline Brochure   . Left knee manipulation  under anasthesia due to contracture -  03/08/08    Dr. Aline Brochure   . Bunionectomy bilateral  feet    . Wisdom tooth extraction    . Dental surgery    . Gum disease  2010    treated by Dr. Andree Elk     There were no vitals filed for this visit.  Visit Diagnosis:  Abnormality of gait  Risk for falls  Poor balance  Leg weakness, bilateral      Subjective Assessment - 12/04/15 1118    Subjective Pt states her Lt knee woke her up this morning throbbing but after getting up and moving around the pain subsides.  Currently without pain.  Still awaiting her appointment to get another  shot in her knee.   Currently in Pain? No/denies                         [facility] Adult PT Treatment/Exercise - 12/04/15 1121    Knee/Hip Exercises: Aerobic   Nustep 10' UE/LE level 2 hills 2   Knee/Hip Exercises: Standing   Heel Raises Both;15 reps   Heel Raises Limitations UE assist    Functional Squat 10 reps   Functional Squat Limitations UE assist   Gait Training QC 6 minutes completed 226 feet   Other Standing Knee Exercises side stepping 1RT, forward amb 1RT both wtithout AD or HHA   Other Standing Knee Exercises scapular retraction, shoulder extension 15x GTB   Knee/Hip Exercises: Seated   Sit to Sand 10 reps                  PT Short Term Goals - 11/11/15 1627    PT SHORT TERM GOAL #1   Title Pt to be independent in HEP in order to obtain goals.    Time 1   Period Weeks   PT SHORT TERM GOAL #2   Title Pt TUG  to be less than 40 seconds to demonstrate decreased risk of falling    Time 3   Period Weeks   PT SHORT TERM GOAL #3   Title Pt strength to be improved 1/2 grade to be able to come sit to stand while seated on higher chairs without UE assist  for improved mobility to decrease fall risk    Time 3   Period Weeks   PT SHORT TERM GOAL #4   Title Pt to be able to SLS x 5 seconds for reduced risk of falls    Time 3   Period Weeks   PT SHORT TERM GOAL #5   Title Pt to feel comforable walking with her quad cane away from the wall for improved confidence in walking independently for improved quality of life.    Time 3   Period Weeks           PT Long Term Goals - 11/11/15 1630    PT LONG TERM GOAL #1   Title Pt to be independent in advance HEP in order to obtain goals   Time 6   Period Weeks   PT LONG TERM GOAL #2   Title Pt to be able to walk with her quad cane for 15 mintues without difficulty for improved health habits.    Time 6   Period Weeks   PT LONG TERM GOAL #3   Title PT TUG score to be less than 30 seconds with quad cane  for reduced risk of falling    Time 6   Period Weeks   PT LONG TERM GOAL #4   Title Pt to demonstrate improved power by stating that she is able get up from her couch without difficulty.    Time 6   Period Weeks   PT LONG TERM GOAL #5   Title Pt  5 sit to stand test to be less than 20 seconds to demonstrate decreased fall riskl   Time 6   Period Weeks               Plan - 12/04/15 1339    Clinical Impression Statement Progressed balance actvities on line without AD.  Pt very apprehensive but able to complete with encouragement.  Forward walking and sidestepping completed.  Pt able to ambulate 226 feet in 6 minutes today using QC.  Pt presented with larger steps and overall improved quality.  Shuffling of Lt LE noted towards end of ambuation due to fatigue.  Ended session with nustep to help improve joint mobility and activity tolerance.     PT Next Visit Plan Gait train with quad cane encouraging longer and faster steps.    Continue to work on balance to  decrease her fear with balance activities.    PT Home Exercise Plan given        Problem List Patient Active Problem List   Diagnosis Date Noted  . Annual physical exam 11/25/2015  . CKD (chronic kidney disease) stage 3, GFR 30-59 ml/min 01/15/2015  . Osteoarthritis of right knee 05/04/2014  . At high risk for falls 05/04/2014  . PVD (peripheral vascular disease) ([place]) 06/08/2011  . ABNORMAL ELECTROCARDIOGRAM 09/19/2009  . Type 2 diabetes, diet controlled ([place]) 12/04/2007  . Essential hypertension 10/14/2006  . OSTEOARTHRITIS 10/14/2006  . OSTEOPENIA 10/14/2006  . COLONIC POLYPS, HX OF 10/14/2006    Teena Irani, PTA/CLT [phone]  12/04/2015, 1:44 PM  [place], [place],  [id number] Phone: [phone]   Fax:  [phone]  Name: [name] MRN: [id number] Date of Birth: 1934-01-05

## 2015-12-09 ENCOUNTER — Encounter (HOSPITAL_COMMUNITY): Payer: Medicare Other | Admitting: Physical Therapy

## 2015-12-11 ENCOUNTER — Ambulatory Visit (HOSPITAL_COMMUNITY): Payer: Medicare Other | Admitting: Physical Therapy

## 2015-12-11 ENCOUNTER — Telehealth (HOSPITAL_COMMUNITY): Payer: Self-pay | Admitting: Physical Therapy

## 2015-12-11 NOTE — Telephone Encounter (Signed)
Called pt and left message re: missed appointment. Pt was reminded of her next appointment.  Rayetta Humphrey, [place] CLT [phone]

## 2015-12-16 ENCOUNTER — Telehealth (HOSPITAL_COMMUNITY): Payer: Self-pay | Admitting: Physical Therapy

## 2015-12-16 ENCOUNTER — Ambulatory Visit (HOSPITAL_COMMUNITY): Payer: Medicare Other | Admitting: Physical Therapy

## 2015-12-16 NOTE — Telephone Encounter (Signed)
Patient called to report that she needs to cancel today's appointment as she has the flu; she is aware that today was her last scheduled appointment and will call clinic when she is feeling better.   Deniece Ree PT, DPT [phone]

## 2016-03-18 ENCOUNTER — Ambulatory Visit (INDEPENDENT_AMBULATORY_CARE_PROVIDER_SITE_OTHER): Payer: Medicare Other | Admitting: Family Medicine

## 2016-03-18 ENCOUNTER — Ambulatory Visit (HOSPITAL_COMMUNITY)
Admission: RE | Admit: 2016-03-18 | Discharge: 2016-03-18 | Disposition: A | Discharge status: Home / Self Care | Payer: Medicare Other | Source: Ambulatory Visit | Attending: Family Medicine | Admitting: Family Medicine

## 2016-03-18 ENCOUNTER — Encounter: Payer: Self-pay | Admitting: Family Medicine

## 2016-03-18 VITALS — BP 118/78 | HR 93 | Resp 16 | Ht 66.0 in | Wt 177.1 lb

## 2016-03-18 DIAGNOSIS — R296 Repeated falls: Secondary | ICD-10-CM | POA: Diagnosis not present

## 2016-03-18 DIAGNOSIS — Z9181 History of falling: Secondary | ICD-10-CM

## 2016-03-18 DIAGNOSIS — E119 Type 2 diabetes mellitus without complications: Secondary | ICD-10-CM | POA: Diagnosis not present

## 2016-03-18 DIAGNOSIS — I10 Essential (primary) hypertension: Secondary | ICD-10-CM | POA: Diagnosis not present

## 2016-03-18 DIAGNOSIS — R634 Abnormal weight loss: Secondary | ICD-10-CM | POA: Insufficient documentation

## 2016-03-18 DIAGNOSIS — R350 Frequency of micturition: Secondary | ICD-10-CM

## 2016-03-18 DIAGNOSIS — R32 Unspecified urinary incontinence: Secondary | ICD-10-CM | POA: Insufficient documentation

## 2016-03-18 DIAGNOSIS — E559 Vitamin D deficiency, unspecified: Secondary | ICD-10-CM

## 2016-03-18 LAB — COMPLETE METABOLIC PANEL WITH GFR
ALBUMIN: 3.3 g/dL — AB (ref 3.6–5.1)
ALT: 13 U/L (ref 6–29)
AST: 31 U/L (ref 10–35)
Alkaline Phosphatase: 64 U/L (ref 33–130)
BUN: 28 mg/dL — AB (ref 7–25)
CHLORIDE: 104 mmol/L (ref 98–110)
CO2: 26 mmol/L (ref 20–31)
Calcium: 8.8 mg/dL (ref 8.6–10.4)
Creat: 1.14 mg/dL — ABNORMAL HIGH (ref 0.60–0.93)
GFR, Est African American: 53 mL/min — ABNORMAL LOW (ref >=60)
GFR, Est Non African American: 46 mL/min — ABNORMAL LOW (ref >=60)
GLUCOSE: 97 mg/dL (ref 65–99)
POTASSIUM: 4.1 mmol/L (ref 3.5–5.3)
SODIUM: 142 mmol/L (ref 135–146)
Total Bilirubin: 0.6 mg/dL (ref 0.2–1.2)
Total Protein: 6.3 g/dL (ref 6.1–8.1)

## 2016-03-18 LAB — POCT URINALYSIS DIPSTICK
BILIRUBIN UA: NEGATIVE
Blood, UA: NEGATIVE
GLUCOSE UA: NEGATIVE
Ketones, UA: NEGATIVE
Leukocytes, UA: NEGATIVE
Nitrite, UA: NEGATIVE
Protein, UA: NEGATIVE
SPEC GRAV UA: 1.015
UROBILINOGEN UA: 0.2
pH, UA: 5.5

## 2016-03-18 LAB — CBC
HCT: 33.3 % — ABNORMAL LOW (ref 35.0–45.0)
HEMOGLOBIN: 10.8 g/dL — AB (ref 11.7–15.5)
MCH: 30.2 pg (ref 27.0–33.0)
MCHC: 32.4 g/dL (ref 32.0–36.0)
MCV: 93 fL (ref 80.0–100.0)
MPV: 11.2 fL (ref 7.5–12.5)
Platelets: 285 10*3/uL (ref 140–400)
RBC: 3.58 MIL/uL — ABNORMAL LOW (ref 3.80–5.10)
RDW: 14.9 % (ref 11.0–15.0)
WBC: 3.6 10*3/uL — AB (ref 3.8–10.8)

## 2016-03-18 LAB — HEMOGLOBIN A1C
Hgb A1c MFr Bld: 6 % — ABNORMAL HIGH (ref <5.7)
Mean Plasma Glucose: 126 mg/dL

## 2016-03-18 LAB — TSH: TSH: 0.94 mIU/L

## 2016-03-18 MED ORDER — MIRTAZAPINE 7.5 MG PO TABS: 7.5000 mg | ORAL_TABLET | Freq: Every day | ORAL | Status: DC

## 2016-03-18 NOTE — Assessment & Plan Note (Signed)
Controlled, no change in medication \ DASH diet and commitment to daily physical activity for a minimum of 30 minutes discussed and encouraged, as a part of hypertension management. Pt wil do chair exercises

## 2016-03-18 NOTE — Patient Instructions (Addendum)
F/u in 2.5 month, call if you need me before  NO new medication, take tylenol one twice daily for knee pain  Dr Percell Miller will provide the help you need for safe mobility Please start supplement like ensure one can daily and ensure you have 3 meals every day, you have lost 16 pounds in 5 months  You need CXR and labs today  You are referred to Dr Laural Golden for evaluation of weight loss  Fall Prevention in the Home  Falls can cause injuries. They can happen to people of all ages. There are many things you can do to make your home safe and to help prevent falls.  WHAT CAN I DO ON THE OUTSIDE OF MY HOME?  Regularly fix the edges of walkways and driveways and fix any cracks.  Remove anything that might make you trip as you walk through a door, such as a raised step or threshold.  Trim any bushes or trees on the path to your home.  Use bright outdoor lighting.  Clear any walking paths of anything that might make someone trip, such as rocks or tools.  Regularly check to see if handrails are loose or broken. Make sure that both sides of any steps have handrails.  Any raised decks and porches should have guardrails on the edges.  Have any leaves, snow, or ice cleared regularly.  Use sand or salt on walking paths during winter.  Clean up any spills in your garage right away. This includes oil or grease spills. WHAT CAN I DO IN THE BATHROOM?   Use night lights.  Install grab bars by the toilet and in the tub and shower. Do not use towel bars as grab bars.  Use non-skid mats or decals in the tub or shower.  If you need to sit down in the shower, use a plastic, non-slip stool.  Keep the floor dry. Clean up any water that spills on the floor as soon as it happens.  Remove soap buildup in the tub or shower regularly.  Attach bath mats securely with double-sided non-slip rug tape.  Do not have throw rugs and other things on the floor that can make you trip. WHAT CAN I DO IN THE  BEDROOM?  Use night lights.  Make sure that you have a light by your bed that is easy to reach.  Do not use any sheets or blankets that are too big for your bed. They should not hang down onto the floor.  Have a firm chair that has side arms. You can use this for support while you get dressed.  Do not have throw rugs and other things on the floor that can make you trip. WHAT CAN I DO IN THE KITCHEN?  Clean up any spills right away.  Avoid walking on wet floors.  Keep items that you use a lot in easy-to-reach places.  If you need to reach something above you, use a strong step stool that has a grab bar.  Keep electrical cords out of the way.  Do not use floor polish or wax that makes floors slippery. If you must use wax, use non-skid floor wax.  Do not have throw rugs and other things on the floor that can make you trip. WHAT CAN I DO WITH MY STAIRS?  Do not leave any items on the stairs.  Make sure that there are handrails on both sides of the stairs and use them. Fix handrails that are broken or loose. Make sure that  handrails are as long as the stairways.  Check any carpeting to make sure that it is firmly attached to the stairs. Fix any carpet that is loose or worn.  Avoid having throw rugs at the top or bottom of the stairs. If you do have throw rugs, attach them to the floor with carpet tape.  Make sure that you have a light switch at the top of the stairs and the bottom of the stairs. If you do not have them, ask someone to add them for you. WHAT ELSE CAN I DO TO HELP PREVENT FALLS?  Wear shoes that:  Do not have high heels.  Have rubber bottoms.  Are comfortable and fit you well.  Are closed at the toe. Do not wear sandals.  If you use a stepladder:  Make sure that it is fully opened. Do not climb a closed stepladder.  Make sure that both sides of the stepladder are locked into place.  Ask someone to hold it for you, if possible.  Clearly mark and make  sure that you can see:  Any grab bars or handrails.  First and last steps.  Where the edge of each step is.  Use tools that help you move around (mobility aids) if they are needed. These include:  Canes.  Walkers.  Scooters.  Crutches.  Turn on the lights when you go into a dark area. Replace any light bulbs as soon as they burn out.  Set up your furniture so you have a clear path. Avoid moving your furniture around.  If any of your floors are uneven, fix them.  If there are any pets around you, be aware of where they are.  Review your medicines with your doctor. Some medicines can make you feel dizzy. This can increase your chance of falling. Ask your doctor what other things that you can do to help prevent falls.   This information is not intended to replace advice given to you by your health care provider. Make sure you discuss any questions you have with your health care provider.   Document Released: 07/31/2009 Document Revised: 02/18/2015 Document Reviewed: 11/08/2014 Elsevier Interactive Patient Education [employer].

## 2016-03-18 NOTE — Assessment & Plan Note (Signed)
[  poor intake with 16 pound weight loss in 5 month, refer to GI for eval, of note pt had heme neg stool in 11/2015, and abdominal exam is negative for mass or organomegaly or tenderness on pa,lpation Reduced mobility and limitation in ability to care for herself and feed herself may be a contributing factor. Updated labs today and CXR also

## 2016-03-18 NOTE — Assessment & Plan Note (Signed)
Updated lab today, well controlled with diet

## 2016-03-18 NOTE — Assessment & Plan Note (Signed)
2 falls and 1 near fall in a 2 week period. Left knee pain and instability is worsened. Has appt next week with ortho, replacement indicated

## 2016-03-18 NOTE — Assessment & Plan Note (Signed)
Urinalysis in office is normal 

## 2016-03-18 NOTE — Progress Notes (Signed)
   Subjective:    Patient ID: [name], female    DOB: 02/07/1934, 82 y.o.   MRN: [id number]  HPI 2 falls  At home in past 2 weeks, left knee buckles, has appt with ortho next week, and she is ready  For any help that she needs, including surgery , near fall at [facility] this past Sunday On Monday did not leave bed, and would hardly eat does state that she is concerned about her poor mobility Denies overt depression No interest in meals on wheels, states she can fix her own meals, and though her daughter lives with her, [name] essentially is still being responsible for feeding herself , which she is clearly less able to do  Review of Systems See HPI Denies recent fever or chills. Denies sinus pressure, nasal congestion, ear pain or sore throat. Denies chest congestion, productive cough or wheezing. Denies chest pains, palpitations and leg swelling    Denies dysuria, frequency, hesitancy or incontinence. Denies headaches, seizures, numbness, or tingling.  Denies skin break down or rash.        Objective:   Physical Exam BP 118/78 mmHg  Pulse 93  Resp 16  Ht 5\' 6"  (1.676 m)  Wt 177 lb 1.9 oz (80.341 kg)  BMI 28.60 kg/m2  SpO2 97%   Patient alert and oriented and in no cardiopulmonary distress.  HEENT: No facial asymmetry, EOMI,   oropharynx pink and moist.  Neck decreased ROM no JVD, no mass.  Chest: Clear to auscultation bilaterally.  CVS: S1, S2 no murmurs, no S3.Regular rate.  ABD: Soft non tender. No palpable organomegaly or mass  Ext: No edema  [id number]  ROM spine, shoulders, hips and knees.Very poor mobility even with a walker with deformed left knee Skin: Intact, no ulcerations or rash noted.  Psych: Good eye contact, normal affect. .  CNS: CN 2-12 intact, power,  normal throughout.no focal deficits noted.        Assessment & Plan:  Loss of weight [poor intake with 16 pound weight loss in 5 month, refer to GI for eval, of note pt had heme  neg stool in 11/2015, and abdominal exam is negative for mass or organomegaly or tenderness on pa,lpation Reduced mobility and limitation in ability to care for herself and feed herself may be a contributing factor. Updated labs today and CXR also  At high risk for falls 2 falls and 1 near fall in a 2 week period. Left knee pain and instability is worsened. Has appt next week with ortho, replacement indicated  Type 2 diabetes, diet controlled Updated lab today, well controlled with diet  Essential hypertension Controlled, no change in medication \ DASH diet and commitment to daily physical activity for a minimum of 30 minutes discussed and encouraged, as a part of hypertension management. Pt wil do chair exercises      Urinary frequency Urinalysis in office is normal

## 2016-03-19 ENCOUNTER — Encounter (INDEPENDENT_AMBULATORY_CARE_PROVIDER_SITE_OTHER): Payer: Self-pay | Admitting: Internal Medicine

## 2016-03-19 LAB — VITAMIN D 25 HYDROXY (VIT D DEFICIENCY, FRACTURES): Vit D, 25-Hydroxy: 29 ng/mL — ABNORMAL LOW (ref 30–100)

## 2016-04-05 ENCOUNTER — Telehealth (HOSPITAL_COMMUNITY): Payer: Self-pay | Admitting: Physical Therapy

## 2016-04-05 NOTE — Telephone Encounter (Signed)
Patient called wanting to schedule new appts with skilled PT services as she has been out for awhile due to illness and personal factors; DPT advised patient that front desk will be notified and will call her back to schedule.  Deniece Ree PT, DPT [phone]

## 2016-04-08 ENCOUNTER — Ambulatory Visit (HOSPITAL_COMMUNITY): Payer: Medicare Other | Attending: Orthopedic Surgery | Admitting: Physical Therapy

## 2016-04-08 DIAGNOSIS — M6281 Muscle weakness (generalized): Secondary | ICD-10-CM | POA: Insufficient documentation

## 2016-04-08 DIAGNOSIS — R2681 Unsteadiness on feet: Secondary | ICD-10-CM | POA: Diagnosis present

## 2016-04-08 DIAGNOSIS — Z9181 History of falling: Secondary | ICD-10-CM | POA: Diagnosis present

## 2016-04-08 DIAGNOSIS — R262 Difficulty in walking, not elsewhere classified: Secondary | ICD-10-CM | POA: Insufficient documentation

## 2016-04-08 NOTE — Therapy (Signed)
[place], [place], [address] Phone: [phone]   Fax:  [phone]  Physical Therapy Evaluation  Patient Details  Name: [name] MRN: [id number] Date of Birth: Sep 08, 1934 Referring Provider: Dr. Kathryne Farmer  Encounter Date: 04/08/2016      PT End of Session - 04/08/16 1339    Visit Number 1   Number of Visits 8   Date for PT Re-Evaluation 05/08/16   Authorization Type U[employer] medicare    Authorization - Visit Number 1   Authorization - Number of Visits 8   PT Start Time [id number]   PT Stop Time 1345   PT Time Calculation (min) 37 min   Activity Tolerance Patient tolerated treatment well   Behavior During Therapy [facility] for tasks assessed/performed      Past Medical History  Diagnosis Date  . Osteopenia   . Arthritis of knee     bilateral    . Diabetes mellitus, type 2 ([place])   . Hypertension   . Personal history of colonic polyps 3 years ago     colonoscopy   . Chronic renal insufficiency   . PAD (peripheral artery disease) [facility])     Past Surgical History  Procedure Laterality Date  . Total abdominal hysterectomy      bleeding   . Left knee replacement  08/14/08    Dr. Aline Farmer   . Left knee manipulation  under anasthesia due to contracture -  03/08/08    Dr. Aline Farmer   . Bunionectomy bilateral  feet    . Wisdom tooth extraction    . Dental surgery    . Gum disease  2010    treated by Dr. Andree Farmer     There were no vitals filed for this visit.       Subjective Assessment - 04/08/16 1320    Subjective Ms. Crush states that she has been having increased knee pain in her Right knee.  Her knee has given out on her twice in the past four months causing her to fall.  She is being referred for overall strengthening to prepare her for a TKR on July 26th.  She states she was moving better until Thanksgiving when she caught a virus and things have seemed to have gone  down hill since then.   She states  that at one point she could not even get up without help from her family which has never happene before. She states now when she is walking with the quad cane she walks close to the wall to make sure she doesn't fall. She currently has increased difficulty getting up from a chair, and her walking has slowed down.   She is now being referred to skilled therapy to maximize her functioanl ability.    Pertinent History Lt TKR, OA, HTN    How long can you sit comfortably? no problem   How long can you stand comfortably? for less than two minutes    How long can you walk comfortably? for less than one minute    Currently in Pain? No/denies  Had an injection in both of her knees on 6/6/ 2017            [facility] PT Assessment - 04/08/16 0001    Assessment   Medical Diagnosis abnormal gait    Referring Provider Dr. Kathryne Farmer   Onset Date/Surgical Date 09/09/15   Hand Dominance Left   Next MD Visit 03/23/2016   Prior Therapy  none for this procedure   Precautions   Precautions Fall   Restrictions   Weight Bearing Restrictions No   Balance Screen   Has the patient fallen in the past 6 months Yes   How many times? 2  right knee giving out    Has the patient had a decrease in activity level because of a fear of falling?  Yes   Is the patient reluctant to leave their home because of a fear of falling?  Yes   [place] residence   Home Access Ramped entrance   Prior Function   Level of Independence Independent with household mobility with device   Vocation Full time employment   Leisure quilting    Cognition   Overall Cognitive Status Within Functional Limits for tasks assessed   Observation/Other Assessments   Focus on Therapeutic Outcomes (FOTO)  26   Functional Tests   Functional tests Single leg stance;Sit to Stand   Single Leg Stance   Comments unable    Sit to Stand   Comments unable without UE ; With UE 5 x 47.68    Posture/Postural Control    Posture/Postural Control Postural limitations   Postural Limitations Rounded Shoulders;Forward head;Decreased lumbar lordosis;Decreased thoracic kyphosis   AROM   Right/Left Knee Right;Left   Right Knee Extension 12   Right Knee Flexion 112   Left Knee Extension 15   Left Knee Flexion 107   Strength   Right/Left Hip Right;Left   Right Hip Flexion 3-/5   Right Hip Extension 2+/5   Right Hip ABduction 3+/5   Left Hip Flexion 3-/5   Left Hip Extension 2+/5   Left Hip ABduction 3-/5   Right Knee Extension 3+/5   Left Knee Extension 4/5   Right Ankle Dorsiflexion 3/5   Left Ankle Dorsiflexion 3+/5   Ambulation/Gait   Ambulation/Gait Yes   Ambulation/Gait Assistance 6: Modified independent (Device/Increase time)   Ambulation Distance (Feet) 30 Feet   Assistive device Small based quad cane   Gait velocity took 51.55 seconds    Standardized Balance Assessment   Standardized Balance Assessment --   Timed Up and Go Test   Normal TUG (seconds) --                   [facility] Adult PT Treatment/Exercise - 04/08/16 0001    Exercises   Exercises Knee/Hip   Knee/Hip Exercises: Seated   Long Arc Quad Strengthening;Right;10 reps   Long Arc Quad Weight 3 lbs.   Other Seated Knee/Hip Exercises Dorsiflexion B 3# x 10    Knee/Hip Exercises: Supine   Quad Sets Both;10 reps   Bridges 10 reps                PT Education - 04/08/16 1338    Education provided Yes   Person(s) Educated Patient   Methods Explanation;Demonstration;Verbal cues;Handout   Comprehension Verbalized understanding;Returned demonstration          PT Short Term Goals - 04/08/16 1350    PT SHORT TERM GOAL #1   Title Pt to be independent in HEP in order to increased strength to decrease falls    Time 1   Period Weeks   PT SHORT TERM GOAL #2   Title Pt to be able to walk 30 ft in 30 seconds to demonstrate increased confidence of walking    Time 3   Period Weeks   Status New   PT SHORT TERM  GOAL #  3   Title Pt strength to be improved 1/2 grade to be able to come sit to stand while seated on higher chairs without UE assist  for improved mobility to decrease fall risk    Time 3   Period Weeks   PT SHORT TERM GOAL #4   Title Pt to be able to SLS x 5 seconds for reduced risk of falls    Time 3   Period Weeks   PT SHORT TERM GOAL #5   Title Pt to feel comforable walking with her quad cane away from the wall for improved confidence in walking independently for improved quality of life.    Time 3   Period Weeks   Status New           PT Long Term Goals - 04/08/16 1352    PT LONG TERM GOAL #1   Title Pt to be independent in advance HEP in order to obtain goals   Time 4   Period Weeks   Status New   PT LONG TERM GOAL #2   Title Pt to be able to walk with her quad cane for 5 mintues for reduce risk of falls in her home     Time 4   Period Weeks   Status New   PT LONG TERM GOAL #3   Title Pt to be able to walk 30 ft in 25 seconds to demonstrate improved balance and strength for decreased risk of falls    Time 4   Period Weeks   Status New               Plan - 04/08/16 1340    Clinical Impression Statement Ms. Manco is a 82 yo female who is going to have a TKR on her right knee on 04/12/2017 due to the fact that her knee has been giving out and she has been falling.  Unfortunately she came down with the flu and then had food poisoning earlier this year which has left her weak with decreased activity tolerance causing her to fall multiple times.   She has been referred to skilled physical therapy to work on her strength, balance and activity tolerance in preparation for her surgery.    Rehab Potential Good   PT Frequency 2x / week   PT Duration 4 weeks   PT Treatment/Interventions ADLs/Self Care Home Management;Cryotherapy;Moist Heat;Gait training;Functional mobility training;Therapeutic activities;Therapeutic exercise;Balance training;Neuromuscular  re-education;Patient/family education;Manual techniques;Passive range of motion   PT Next Visit Plan Begin heel raises, functional squats, standing hip abduction and extension, tandem stance, narrow base stance, increase functional exercises as able    PT Home Exercise Plan given   Consulted and Agree with Plan of Care Patient      Patient will benefit from skilled therapeutic intervention in order to improve the following deficits and impairments:  Abnormal gait, Decreased activity tolerance, Decreased balance, Decreased endurance, Decreased mobility, Decreased strength, Difficulty walking, Pain, Impaired flexibility  Visit Diagnosis: Difficulty in walking, not elsewhere classified - Plan: PT plan of care cert/re-cert  Unsteadiness on feet - Plan: PT plan of care cert/re-cert  History of falling - Plan: PT plan of care cert/re-cert  Muscle weakness (generalized) - Plan: PT plan of care cert/re-cert      G-Codes - [id number]    Functional Limitation Mobility: Walking and moving around   Mobility: Walking and Moving Around Current Status [id number]) At least 60 percent but less than 80 percent impaired, limited or restricted   Mobility:  Walking and Moving Around Goal Status [phone]) At least 40 percent but less than 60 percent impaired, limited or restricted       Problem List Patient Active Problem List   Diagnosis Date Noted  . Loss of weight 03/18/2016  . Urinary frequency 03/18/2016  . Annual physical exam 11/25/2015  . CKD (chronic kidney disease) stage 3, GFR 30-59 ml/min 01/15/2015  . Osteoarthritis of right knee 05/04/2014  . At high risk for falls 05/04/2014  . PVD (peripheral vascular disease) ([place]) 06/08/2011  . ABNORMAL ELECTROCARDIOGRAM 09/19/2009  . Type 2 diabetes, diet controlled ([place]) 12/04/2007  . Essential hypertension 10/14/2006  . OSTEOARTHRITIS 10/14/2006  . OSTEOPENIA 10/14/2006  . COLONIC POLYPS, HX OF 10/14/2006   Rayetta Humphrey, PT  CLT [phone] 04/08/2016, 2:01 PM  [place] [address] [place], [place], [address] Phone: [phone]   Fax:  [phone]  Name: [name] MRN: [id number] Date of Birth: Apr 16, 1934

## 2016-04-08 NOTE — Patient Instructions (Signed)
Knee Extension (Sitting)    Place _3___ pound weight on left ankle and straighten knee fully, lower slowly. Repeat 10___ times per set. Do _1___ sets per session. Do __3__ sessions per day.  http://orth.exer.us/732   Copyright  VHI. All rights reserved.  Strengthening: Straight Leg Raise (Phase 1)    Tighten muscles on front of right thigh, then lift leg __10__ inches from surface, keeping knee locked.  Repeat __10__ times per set. Do _1___ sets per session. Do __2__ sessions per day.  http://orth.exer.us/614   Copyright  VHI. All rights reserved.  Strengthening: Quadriceps Set    Tighten muscles on top of thighs by pushing knees down into surface. Hold ___5_ seconds. Repeat _10___ times per set. Do __1__ sets per session. Do __10__ sessions per day.  http://orth.exer.us/602   Copyright  VHI. All rights reserved.  Bridging    Slowly raise buttocks from floor, keeping stomach tight. Repeat _10___ times per set. Do __1__ sets per session. Do ___2_ sessions per day.  http://orth.exer.us/1096   Copyright  VHI. All rights reserved.  Strengthening: Hip Abduction (Side-Lying)    Tighten muscles on front of left thigh, then lift leg __10__ inches from surface, keeping knee locked.  Repeat _10___ times per set. Do __1__ sets per session. Do 2____ sessions per day.  http://orth.exer.us/622   Copyright  VHI. All rights reserved.

## 2016-04-12 ENCOUNTER — Ambulatory Visit (HOSPITAL_COMMUNITY): Payer: Medicare Other | Admitting: Physical Therapy

## 2016-04-12 DIAGNOSIS — R262 Difficulty in walking, not elsewhere classified: Secondary | ICD-10-CM

## 2016-04-12 DIAGNOSIS — R2681 Unsteadiness on feet: Secondary | ICD-10-CM

## 2016-04-12 DIAGNOSIS — Z9181 History of falling: Secondary | ICD-10-CM

## 2016-04-12 DIAGNOSIS — M6281 Muscle weakness (generalized): Secondary | ICD-10-CM

## 2016-04-12 NOTE — Therapy (Signed)
[place], [place], [address] Phone: [phone]   Fax:  [phone]  Physical Therapy Treatment  Patient Details  Name: [name] MRN: [id number] Date of Birth: 06-20-1934 Referring Provider: Dr. Kathryne Hitch  Encounter Date: 04/12/2016      PT End of Session - 04/12/16 0940    Visit Number 2   Number of Visits 8   Date for PT Re-Evaluation 05/08/16   Authorization Type [employer] medicare    Authorization - Visit Number 1   Authorization - Number of Visits 8   PT Start Time 0904   PT Stop Time 0943   PT Time Calculation (min) 39 min   Activity Tolerance Patient tolerated treatment well   Behavior During Therapy [facility] for tasks assessed/performed      Past Medical History  Diagnosis Date  . Osteopenia   . Arthritis of knee     bilateral    . Diabetes mellitus, type 2 ([place])   . Hypertension   . Personal history of colonic polyps 3 years ago     colonoscopy   . Chronic renal insufficiency   . PAD (peripheral artery disease) [facility])     Past Surgical History  Procedure Laterality Date  . Total abdominal hysterectomy      bleeding   . Left knee replacement  08/14/08    Dr. Aline Brochure   . Left knee manipulation  under anasthesia due to contracture -  03/08/08    Dr. Aline Brochure   . Bunionectomy bilateral  feet    . Wisdom tooth extraction    . Dental surgery    . Gum disease  2010    treated by Dr. Andree Elk     There were no vitals filed for this visit.      Subjective Assessment - 04/12/16 0905    Subjective Pt states she continues to have knee pain. Her Lt knee feels weak and she needs to get it stronger. She has been doing her exercises regularly.    Pertinent History Lt TKR, OA, HTN    How long can you sit comfortably? no problem   How long can you stand comfortably? for less than two minutes    How long can you walk comfortably? for less than one minute    Currently in Pain? No/denies                          [facility] Adult PT Treatment/Exercise - 04/12/16 0001    Knee/Hip Exercises: Seated   Long Arc [employer];Both   Long Arc Quad Weight 5 lbs.   Heel Slides Both;2 sets;10 reps;15 reps   Heel Slides Limitations red TB   Ball Squeeze x15 reps    Other Seated Knee/Hip Exercises heel raises x20, toe raises x15 each   Knee/Hip Exercises: Supine   Bridges Both;2 sets;10 reps   Other Supine Knee/Hip Exercises clamshells x15 with red TB, x15 with green TB                PT Education - 04/12/16 0940    Education provided Yes   Education Details updated HEP   Person(s) Educated Patient   Methods Explanation;Demonstration;Handout   Comprehension Verbalized understanding;Returned demonstration          PT Short Term Goals - 04/08/16 1350    PT SHORT TERM GOAL #1   Title Pt to be independent in HEP in order to increased  strength to decrease falls    Time 1   Period Weeks   PT SHORT TERM GOAL #2   Title Pt to be able to walk 30 ft in 30 seconds to demonstrate increased confidence of walking    Time 3   Period Weeks   Status New   PT SHORT TERM GOAL #3   Title Pt strength to be improved 1/2 grade to be able to come sit to stand while seated on higher chairs without UE assist  for improved mobility to decrease fall risk    Time 3   Period Weeks   PT SHORT TERM GOAL #4   Title Pt to be able to SLS x 5 seconds for reduced risk of falls    Time 3   Period Weeks   PT SHORT TERM GOAL #5   Title Pt to feel comforable walking with her quad cane away from the wall for improved confidence in walking independently for improved quality of life.    Time 3   Period Weeks   Status New           PT Long Term Goals - 04/08/16 1352    PT LONG TERM GOAL #1   Title Pt to be independent in advance HEP in order to obtain goals   Time 4   Period Weeks   Status New   PT LONG TERM GOAL #2   Title Pt to be able to walk with her quad cane for 5  mintues for reduce risk of falls in her home     Time 4   Period Weeks   Status New   PT LONG TERM GOAL #3   Title Pt to be able to walk 30 ft in 25 seconds to demonstrate improved balance and strength for decreased risk of falls    Time 4   Period Weeks   Status New               Plan - 04/12/16 0940    Clinical Impression Statement Today's session focused on therex targeting BLE. Pt demonstrating ability to perform all progressions of therex with increased reps/resistance. Noted increased difficulty with LLE compared to the Rt. Will continue with current POC.    Rehab Potential Good   PT Frequency 2x / week   PT Duration 4 weeks   PT Treatment/Interventions ADLs/Self Care Home Management;Cryotherapy;Moist Heat;Gait training;Functional mobility training;Therapeutic activities;Therapeutic exercise;Balance training;Neuromuscular re-education;Patient/family education;Manual techniques;Passive range of motion   PT Next Visit Plan continue to progress open chain strengthening as able and progress to functional squats, standing hip abduction and extension. balance training of tandem stance, narrow base stance   PT Home Exercise Plan addition of seated heel raises   Consulted and Agree with Plan of Care Patient      Patient will benefit from skilled therapeutic intervention in order to improve the following deficits and impairments:  Abnormal gait, Decreased activity tolerance, Decreased balance, Decreased endurance, Decreased mobility, Decreased strength, Difficulty walking, Pain, Impaired flexibility  Visit Diagnosis: Difficulty in walking, not elsewhere classified  Unsteadiness on feet  History of falling  Muscle weakness (generalized)     Problem List Patient Active Problem List   Diagnosis Date Noted  . Loss of weight 03/18/2016  . Urinary frequency 03/18/2016  . Annual physical exam 11/25/2015  . CKD (chronic kidney disease) stage 3, GFR 30-59 ml/min 01/15/2015  .  Osteoarthritis of right knee 05/04/2014  . At high risk for falls 05/04/2014  . PVD (  peripheral vascular disease) ([place]) 06/08/2011  . ABNORMAL ELECTROCARDIOGRAM 09/19/2009  . Type 2 diabetes, diet controlled ([place]) 12/04/2007  . Essential hypertension 10/14/2006  . OSTEOARTHRITIS 10/14/2006  . OSTEOPENIA 10/14/2006  . COLONIC POLYPS, HX OF 10/14/2006    9:45 AM,04/12/2016 Elly Modena PT, DPT [facility] [address] [place], [place], [address] Phone: [phone]   Fax:  [phone]  Name: [name] MRN: [id number] Date of Birth: 26-Dec-1933

## 2016-04-13 ENCOUNTER — Encounter: Payer: Self-pay | Admitting: Cardiovascular Disease

## 2016-04-13 ENCOUNTER — Ambulatory Visit (INDEPENDENT_AMBULATORY_CARE_PROVIDER_SITE_OTHER): Payer: Medicare Other | Admitting: Cardiovascular Disease

## 2016-04-13 VITALS — BP 126/70 | HR 72 | Ht 66.5 in | Wt 177.4 lb

## 2016-04-13 DIAGNOSIS — I491 Atrial premature depolarization: Secondary | ICD-10-CM | POA: Diagnosis not present

## 2016-04-13 DIAGNOSIS — R600 Localized edema: Secondary | ICD-10-CM | POA: Diagnosis not present

## 2016-04-13 DIAGNOSIS — I739 Peripheral vascular disease, unspecified: Secondary | ICD-10-CM | POA: Diagnosis not present

## 2016-04-13 DIAGNOSIS — Z01810 Encounter for preprocedural cardiovascular examination: Secondary | ICD-10-CM

## 2016-04-13 NOTE — Patient Instructions (Addendum)
Medication Instructions:  Your physician recommends that you continue on your current medications as directed. Please refer to the Current Medication list given to you today.   Labwork: none  Testing/Procedures: Your physician has requested that you have an ankle brachial index (ABI). During this test an ultrasound and blood pressure cuff are used to evaluate the arteries that supply the arms and legs with blood. Allow thirty minutes for this exam. There are no restrictions or special instructions. To be done in January 2018    Follow-Up: Your physician wants you to follow-up in: 12 months.  You will receive a reminder letter in the mail two months in advance. If you don't receive a letter, please call our office to schedule the follow-up appointment.   Any Other Special Instructions Will Be Listed Below (If Applicable).     If you need a refill on your cardiac medications before your next appointment, please call your pharmacy.

## 2016-04-13 NOTE — Progress Notes (Signed)
Chief Complaint  Patient presents with  . Essential Hypertension     History of Present Illness: [name] with history of PAD, HTN, DM and OA who is here today for PV follow up. I saw her as a new patient in 2011 for PV evaluation. She underwent segmental pressure study in May 2011 which suggested left lower extremity occlusive disease. ABI was normal on the right and reduced mildly at 0.77 on the left. She had no claudication, rest pain or ulcerations. She has chronic edema in the left leg with signs of venous stasis. Venous doppler December 2016 without evidence of left leg DVT. She had her left knee replaced in 2011. Last lower extremity doppler studies January 2017 with normal bilateral ABI.     She is here for follow up. She has no pain in her legs with walking. No calf pain. No chest pain or SOB. She has an upcoming right knee replacement.   Primary Care Physician: Diana Nakayama, MD   Past Medical History  Diagnosis Date  . Osteopenia   . Arthritis of knee     bilateral    . Diabetes mellitus, type 2 ([place])   . Hypertension   . Personal history of colonic polyps 3 years ago     colonoscopy   . Chronic renal insufficiency   . PAD (peripheral artery disease) [facility])     Past Surgical History  Procedure Laterality Date  . Total abdominal hysterectomy      bleeding   . Left knee replacement  08/14/08    Dr. Aline Brochure   . Left knee manipulation  under anasthesia due to contracture -  03/08/08    Dr. Aline Brochure   . Bunionectomy bilateral  feet    . Wisdom tooth extraction    . Dental surgery    . Gum disease  2010    treated by Dr. Andree Elk     Current Outpatient Prescriptions  Medication Sig Dispense Refill  . acetaminophen (TYLENOL) 325 MG tablet Take 650 mg by mouth daily as needed (arthritis pain).    Marland Kitchen aspirin (ASPIRIN LOW DOSE) 81 MG EC tablet Take 81 mg by mouth daily.      . Calcium Carbonate-Vitamin D (CALTRATE 600+D) 600-400 MG-UNIT per tablet Take 1 tablet by  mouth 2 (two) times daily.      . diclofenac sodium (VOLTAREN) 1 % GEL Apply 4 g topically 2 (two) times daily as needed (joint pain).    Marland Kitchen diphenhydrAMINE (BENADRYL) 50 MG tablet Take 50 mg by mouth at bedtime as needed for itching or allergies.    Marland Kitchen lisinopril-hydrochlorothiazide (PRINZIDE,ZESTORETIC) 20-12.5 MG per tablet TAKE ONE TABLET BY MOUTH EVERY DAY 90 tablet 1  . multivitamin (THERAGRAN) per tablet Take 1 tablet by mouth daily.       No current facility-administered medications for this visit.    Allergies  Allergen Reactions  . Fosamax [Alendronate Sodium] Other (See Comments)    Pt reports excessive urination on the day she took the pill    Social History   Social History  . Marital Status: Widowed    Spouse Name: N/A  . Number of Children: 3  . Years of Education: college    Occupational History  . retired Chemical engineer     Social History Main Topics  . Smoking status: Former Research scientist (life sciences)  . Smokeless tobacco: Not on file     Comment: smoked for 15 years-not smoked since 1998  . Alcohol Use: No  .  Drug Use: No  . Sexual Activity: Not Currently   Other Topics Concern  . Not on file   Social History Narrative    Family History  Problem Relation Age of Onset  . Colon cancer Father   . Cancer Mother   . Stroke Mother   . Hypertension Brother     Review of Systems:  As stated in the HPI and otherwise negative.   BP 126/70 mmHg  Pulse 72  Ht 5' 6.5" (1.689 m)  Wt 177 lb 6.4 oz (80.468 kg)  BMI 28.21 kg/m2  Physical Examination: General: Well developed, well nourished, NAD HEENT: OP clear, mucus membranes moist SKIN: warm, dry. No rashes. Neuro: No focal deficits Musculoskeletal: Muscle strength 5/5 all ext Psychiatric: Mood and affect normal Neck: No JVD, no carotid bruits, no thyromegaly, no lymphadenopathy. Lungs:Clear bilaterally, no wheezes, rhonci, crackles Cardiovascular: Regular rate and rhythm with ectopy. No murmurs, gallops or  rubs. Abdomen:Soft. Bowel sounds present. Non-tender.  Extremities: 1+ Left lower extremity edema. Pulses are trace to 1 + in the bilateral DP/PT.  EKG:  EKG is ordered today. The ekg ordered today demonstrates Sinus, rate 72 bpm.   Recent Labs: 03/18/2016: ALT 13; BUN 28*; Creat 1.14*; Hemoglobin 10.8*; Platelets 285; Potassium 4.1; Sodium 142; TSH 0.94   Lipid Panel    Component Value Date/Time   CHOL 149 06/09/2015 1118   TRIG 51 06/09/2015 1118   HDL 64 06/09/2015 1118   CHOLHDL 2.3 06/09/2015 1118   VLDL 10 06/09/2015 1118   LDLCALC 75 06/09/2015 1118     Wt Readings from Last 3 Encounters:  04/13/16 177 lb 6.4 oz (80.468 kg)  03/18/16 177 lb 1.9 oz (80.341 kg)  11/25/15 186 lb (84.369 kg)     Other studies Reviewed: Additional studies/ records that were reviewed today include: . Review of the above records demonstrates:    Assessment and Plan:   1. PAD: Stable. She has pain in her right knee prosthesis. She has no calf pain or foot pain. ABI normal January 2017.   2. LE edema, left: venous doppler without evidence of DVT December 2016.   3. PACs: She has PACs today on EKG. No palpitations.   4. Pre-operative cardiovascular examination: She has no chest pain, signs of heart failure or significant arrhythmias. She is functional but limited by knee pain. I do not think ischemic testing is necessary before her planned right knee replacement. I would proceed with the planned surgical procedure in July.   Current medicines are reviewed at length with the patient today.  The patient does not have concerns regarding medicines.  The following changes have been made:  no change  Labs/ tests ordered today include:   Orders Placed This Encounter  Procedures  . EKG 12-Lead   Disposition:   FU with me in 6 months  Signed, Lauree Chandler, MD 04/13/2016 11:55 AM    [facility] [place], [place], [place]  [address] Phone: [phone];  Fax: [phone]

## 2016-04-14 ENCOUNTER — Ambulatory Visit (INDEPENDENT_AMBULATORY_CARE_PROVIDER_SITE_OTHER): Payer: Medicare Other | Admitting: Internal Medicine

## 2016-04-14 ENCOUNTER — Encounter (INDEPENDENT_AMBULATORY_CARE_PROVIDER_SITE_OTHER): Payer: Self-pay | Admitting: Internal Medicine

## 2016-04-14 ENCOUNTER — Other Ambulatory Visit (INDEPENDENT_AMBULATORY_CARE_PROVIDER_SITE_OTHER): Payer: Self-pay | Admitting: Internal Medicine

## 2016-04-14 ENCOUNTER — Encounter (INDEPENDENT_AMBULATORY_CARE_PROVIDER_SITE_OTHER): Payer: Self-pay | Admitting: *Deleted

## 2016-04-14 ENCOUNTER — Other Ambulatory Visit (INDEPENDENT_AMBULATORY_CARE_PROVIDER_SITE_OTHER): Payer: Self-pay | Admitting: *Deleted

## 2016-04-14 VITALS — BP 110/52 | HR 65 | Temp 98.2°F | Ht 66.5 in | Wt 178.9 lb

## 2016-04-14 DIAGNOSIS — D509 Iron deficiency anemia, unspecified: Secondary | ICD-10-CM

## 2016-04-14 DIAGNOSIS — R195 Other fecal abnormalities: Secondary | ICD-10-CM

## 2016-04-14 DIAGNOSIS — R634 Abnormal weight loss: Secondary | ICD-10-CM

## 2016-04-14 LAB — FERRITIN: Ferritin: 29 ng/mL (ref 20–288)

## 2016-04-14 NOTE — Progress Notes (Signed)
Subjective:    Patient ID: [name], female    DOB: 19-Jul-1934, 82 y.o.   MRN: [id number]  HPI PCP: Dr.Simpson: Referred by Dr. Moshe Cipro for weight loss. Also noted to have a decrease in her hemoglobin. Hemoglobin 01/10/2015 was normal. 03/18/2016 Hemoglobin 10.8 Guaiac negative in February of this year. Has normal weight is 185.  Today her weight is 178.9. Patient states she cut back her portions sizes. She is only eating [place], fish, chicken, and vegetables. She eats a slice of bread in the morning if she eat eggs. She also eats [employer]. Her appetite is good. She says she prepares all of her foods.  There is no abdominal pain. She has a BM daily. No melena or BRRB.  She takes Occupational therapy twice a week for her knee. Last colonoscopy in 2008 with left sided diverticulosis.   Family hx of colon in father age 60.  CBC    Component Value Date/Time   WBC 3.6* 03/18/2016 1235   RBC 3.58* 03/18/2016 1235   HGB 10.8* 03/18/2016 1235   HCT 33.3* 03/18/2016 1235   PLT 285 03/18/2016 1235   MCV 93.0 03/18/2016 1235   MCH 30.2 03/18/2016 1235   MCHC 32.4 03/18/2016 1235   RDW 14.9 03/18/2016 1235   LYMPHSABS 1.8 01/10/2015 1304   MONOABS 0.4 01/10/2015 1304   EOSABS 0.1 01/10/2015 1304   BASOSABS 0.0 01/10/2015 1304    CBC Latest Ref Rng 03/18/2016 01/10/2015 11/20/2013  WBC 3.8 - 10.8 K/uL 3.6(L) 4.2 4.0  Hemoglobin 11.7 - 15.5 g/dL 10.8(L) 12.7 13.2  Hematocrit 35.0 - 45.0 % 33.3(L) 38.5 38.3  Platelets 140 - 400 K/uL 285 234 225        Review of Systems Past Medical History  Diagnosis Date  . Osteopenia   . Arthritis of knee     bilateral    . Diabetes mellitus, type 2 ([place])   . Hypertension   . Personal history of colonic polyps 3 years ago     colonoscopy   . Chronic renal insufficiency   . PAD (peripheral artery disease) [facility])     Past Surgical History  Procedure Laterality Date  . Total abdominal hysterectomy      bleeding   . Left knee  replacement  08/14/08    Dr. Aline Brochure   . Left knee manipulation  under anasthesia due to contracture -  03/08/08    Dr. Aline Brochure   . Bunionectomy bilateral  feet    . Wisdom tooth extraction    . Dental surgery    . Gum disease  2010    treated by Dr. Andree Elk     Allergies  Allergen Reactions  . Fosamax [Alendronate Sodium] Other (See Comments)    Pt reports excessive urination on the day she took the pill    Current Outpatient Prescriptions on File Prior to Visit  Medication Sig Dispense Refill  . acetaminophen (TYLENOL) 325 MG tablet Take 650 mg by mouth daily as needed (arthritis pain).    Marland Kitchen aspirin (ASPIRIN LOW DOSE) 81 MG EC tablet Take 81 mg by mouth daily.      . Calcium Carbonate-Vitamin D (CALTRATE 600+D) 600-400 MG-UNIT per tablet Take 1 tablet by mouth 2 (two) times daily.      . diclofenac sodium (VOLTAREN) 1 % GEL Apply 4 g topically 2 (two) times daily as needed (joint pain).    Marland Kitchen diphenhydrAMINE (BENADRYL) 50 MG tablet Take 50 mg by mouth at bedtime as  needed for itching or allergies.    Marland Kitchen lisinopril-hydrochlorothiazide (PRINZIDE,ZESTORETIC) 20-12.5 MG per tablet TAKE ONE TABLET BY MOUTH EVERY DAY 90 tablet 1  . multivitamin (THERAGRAN) per tablet Take 1 tablet by mouth daily.       No current facility-administered medications on file prior to visit.        Objective:   Physical ExamBlood pressure 110/52, pulse 65, temperature 98.2 F (36.8 C), height 5' 6.5" (1.689 m), weight 178 lb 14.4 oz (81.149 kg).  Alert and oriented. Skin warm and dry. Oral mucosa is moist.   . Sclera anicteric, conjunctivae is pink. Thyroid not enlarged. No cervical lymphadenopathy. Lungs clear. Heart regular rate and rhythm.  Abdomen is soft. Bowel sounds are positive. No hepatomegaly. No abdominal masses felt. No tenderness.  No edema to lower extremities.  Stool brown and guaiac positive.     Lot [id number] Ex 9/17    Assessment & Plan:  Weight loss, guaiac + stool,  and anemia. Really  no significant weight. She did change her diet. Am going to get iron studies. Iron, TIBC, Ferritin Patient will need a colonoscopy to rule out colonic neoplasm.

## 2016-04-14 NOTE — Telephone Encounter (Signed)
Patient needs trilyte 

## 2016-04-14 NOTE — Patient Instructions (Signed)
Colonoscopy.  The risks and benefits such as perforation, bleeding, and infection were reviewed with the patient and is agreeable. 

## 2016-04-15 ENCOUNTER — Ambulatory Visit (HOSPITAL_COMMUNITY): Payer: Medicare Other | Admitting: Physical Therapy

## 2016-04-15 DIAGNOSIS — R262 Difficulty in walking, not elsewhere classified: Secondary | ICD-10-CM

## 2016-04-15 DIAGNOSIS — M6281 Muscle weakness (generalized): Secondary | ICD-10-CM

## 2016-04-15 DIAGNOSIS — Z9181 History of falling: Secondary | ICD-10-CM

## 2016-04-15 DIAGNOSIS — R2681 Unsteadiness on feet: Secondary | ICD-10-CM

## 2016-04-15 LAB — IRON AND TIBC
%SAT: 24 % (ref 11–50)
IRON: 77 ug/dL (ref 45–160)
TIBC: 323 ug/dL (ref 250–450)
UIBC: 246 ug/dL (ref 125–400)

## 2016-04-15 MED ORDER — PEG 3350-KCL-NA BICARB-NACL 420 G PO SOLR: 4000.0000 mL | Freq: Once | ORAL | Status: DC

## 2016-04-15 NOTE — Therapy (Signed)
[place], [place], [address] Phone: [phone]   Fax:  [phone]  Physical Therapy Treatment  Patient Details  Name: [name] MRN: [id number] Date of Birth: 08-Aug-1934 Referring Provider: Dr. Kathryne Hitch  Encounter Date: 04/15/2016      PT End of Session - 04/15/16 0946    Visit Number 3   Number of Visits 8   Date for PT Re-Evaluation 05/08/16   Authorization Type [employer] medicare    Authorization - Visit Number 3   Authorization - Number of Visits 8   PT Start Time 0910   PT Stop Time 0950   PT Time Calculation (min) 40 min   Activity Tolerance Patient tolerated treatment well   Behavior During Therapy [facility] for tasks assessed/performed      Past Medical History  Diagnosis Date  . Osteopenia   . Arthritis of knee     bilateral    . Diabetes mellitus, type 2 ([place])   . Hypertension   . Personal history of colonic polyps 3 years ago     colonoscopy   . Chronic renal insufficiency   . PAD (peripheral artery disease) [facility])     Past Surgical History  Procedure Laterality Date  . Total abdominal hysterectomy      bleeding   . Left knee replacement  08/14/08    Dr. Aline Brochure   . Left knee manipulation  under anasthesia due to contracture -  03/08/08    Dr. Aline Brochure   . Bunionectomy bilateral  feet    . Wisdom tooth extraction    . Dental surgery    . Gum disease  2010    treated by Dr. Andree Elk     There were no vitals filed for this visit.      Subjective Assessment - 04/15/16 0923    Subjective PT states she did not do her HEP this morning as she overslept.  Pt was 8 minutes late for appt.   States she currently has no pain but goes up at night when the LT knee begins to hurt her.   Currently in Pain? No/denies                         [facility] Adult PT Treatment/Exercise - 04/15/16 0001    Knee/Hip Exercises: Standing   Heel Raises Both;10 reps   Heel Raises Limitations  toeraises 10 reps   Hip Abduction Both;10 reps   Hip Extension Both;10 reps   Knee/Hip Exercises: Seated   Long Probation officer;Both   Long Arc Quad Weight 5 lbs.   Sit to Sand 10 reps;without UE support  from elevated surface   Knee/Hip Exercises: Supine   Heel Slides Both;10 reps   Bridges Both;2 sets;10 reps   Straight Leg Raises Both;10 reps   Other Supine Knee/Hip Exercises hip abduction 10 reps each                  PT Short Term Goals - 04/08/16 1350    PT SHORT TERM GOAL #1   Title Pt to be independent in HEP in order to increased strength to decrease falls    Time 1   Period Weeks   PT SHORT TERM GOAL #2   Title Pt to be able to walk 30 ft in 30 seconds to demonstrate increased confidence of walking    Time 3   Period Weeks   Status New  PT SHORT TERM GOAL #3   Title Pt strength to be improved 1/2 grade to be able to come sit to stand while seated on higher chairs without UE assist  for improved mobility to decrease fall risk    Time 3   Period Weeks   PT SHORT TERM GOAL #4   Title Pt to be able to SLS x 5 seconds for reduced risk of falls    Time 3   Period Weeks   PT SHORT TERM GOAL #5   Title Pt to feel comforable walking with her quad cane away from the wall for improved confidence in walking independently for improved quality of life.    Time 3   Period Weeks   Status New           PT Long Term Goals - 04/08/16 1352    PT LONG TERM GOAL #1   Title Pt to be independent in advance HEP in order to obtain goals   Time 4   Period Weeks   Status New   PT LONG TERM GOAL #2   Title Pt to be able to walk with her quad cane for 5 mintues for reduce risk of falls in her home     Time 4   Period Weeks   Status New   PT LONG TERM GOAL #3   Title Pt to be able to walk 30 ft in 25 seconds to demonstrate improved balance and strength for decreased risk of falls    Time 4   Period Weeks   Status New               Plan - 04/15/16  0946    Clinical Impression Statement Continued focus on improving LE strength bialterally.  Pt with noted weakness and inablity to keep knee in full extenstion with most exercises. Progressed to standing actvities with multimodal cues for form.  Pt Completes all mobility very slow.  No c/o pain during or after session.     Rehab Potential Good   PT Frequency 2x / week   PT Duration 4 weeks   PT Treatment/Interventions ADLs/Self Care Home Management;Cryotherapy;Moist Heat;Gait training;Functional mobility training;Therapeutic activities;Therapeutic exercise;Balance training;Neuromuscular re-education;Patient/family education;Manual techniques;Passive range of motion   PT Next Visit Plan continue exercise progression to  balance training of tandem stance, narrow base stance   Consulted and Agree with Plan of Care Patient      Patient will benefit from skilled therapeutic intervention in order to improve the following deficits and impairments:  Abnormal gait, Decreased activity tolerance, Decreased balance, Decreased endurance, Decreased mobility, Decreased strength, Difficulty walking, Pain, Impaired flexibility  Visit Diagnosis: Difficulty in walking, not elsewhere classified  Unsteadiness on feet  History of falling  Muscle weakness (generalized)     Problem List Patient Active Problem List   Diagnosis Date Noted  . Loss of weight 03/18/2016  . Urinary frequency 03/18/2016  . Annual physical exam 11/25/2015  . CKD (chronic kidney disease) stage 3, GFR 30-59 ml/min 01/15/2015  . Osteoarthritis of right knee 05/04/2014  . At high risk for falls 05/04/2014  . PVD (peripheral vascular disease) ([place]) 06/08/2011  . ABNORMAL ELECTROCARDIOGRAM 09/19/2009  . Type 2 diabetes, diet controlled ([place]) 12/04/2007  . Essential hypertension 10/14/2006  . OSTEOARTHRITIS 10/14/2006  . OSTEOPENIA 10/14/2006  . COLONIC POLYPS, HX OF 10/14/2006    Teena Irani,  PTA/CLT [phone]  04/15/2016, 9:50 AM  [place] [address],  [place], [address] Phone: [phone]   Fax:  [phone]  Name: [name] MRN: [id number] Date of Birth: 02/06/1934

## 2016-04-19 ENCOUNTER — Encounter (HOSPITAL_COMMUNITY): Payer: Medicare Other | Admitting: Physical Therapy

## 2016-04-22 ENCOUNTER — Telehealth (INDEPENDENT_AMBULATORY_CARE_PROVIDER_SITE_OTHER): Payer: Self-pay | Admitting: *Deleted

## 2016-04-22 ENCOUNTER — Ambulatory Visit (HOSPITAL_COMMUNITY): Payer: Medicare Other | Attending: Orthopedic Surgery | Admitting: Physical Therapy

## 2016-04-22 ENCOUNTER — Telehealth (HOSPITAL_COMMUNITY): Payer: Self-pay | Admitting: Physical Therapy

## 2016-04-22 DIAGNOSIS — R262 Difficulty in walking, not elsewhere classified: Secondary | ICD-10-CM | POA: Insufficient documentation

## 2016-04-22 DIAGNOSIS — R2681 Unsteadiness on feet: Secondary | ICD-10-CM | POA: Insufficient documentation

## 2016-04-22 DIAGNOSIS — R269 Unspecified abnormalities of gait and mobility: Secondary | ICD-10-CM | POA: Insufficient documentation

## 2016-04-22 DIAGNOSIS — R29898 Other symptoms and signs involving the musculoskeletal system: Secondary | ICD-10-CM | POA: Insufficient documentation

## 2016-04-22 DIAGNOSIS — M6281 Muscle weakness (generalized): Secondary | ICD-10-CM | POA: Insufficient documentation

## 2016-04-22 DIAGNOSIS — R2689 Other abnormalities of gait and mobility: Secondary | ICD-10-CM | POA: Insufficient documentation

## 2016-04-22 DIAGNOSIS — Z9181 History of falling: Secondary | ICD-10-CM | POA: Insufficient documentation

## 2016-04-22 NOTE — Telephone Encounter (Signed)
Pt did not show for appointment.  Called and spoke with patient who states she forgot her appt.  Reminded of next appt on 7/12 at 1:45pm Teena Irani, PTA/CLT [phone]

## 2016-04-22 NOTE — Telephone Encounter (Signed)
noted 

## 2016-04-22 NOTE — Telephone Encounter (Signed)
Patient called, wants to cancel TCS sch'd 05/06/16, her surgeon don't want her to have right now, it's too close to her having knee surgery.

## 2016-04-23 ENCOUNTER — Other Ambulatory Visit: Payer: Self-pay | Admitting: Physician Assistant

## 2016-04-23 NOTE — H&P (Signed)
TOTAL KNEE ADMISSION H&P  Patient is being admitted for right total knee arthroplasty.  Subjective:  Chief Complaint:right knee pain.  HPI: [name], 82 y.o. female, has a history of pain and functional disability in the right knee due to arthritis and has failed non-surgical conservative treatments for greater than 12 weeks to includeNSAID's and/or analgesics, corticosteriod injections and use of assistive devices.  Onset of symptoms was gradual, starting 4 years ago with rapidlly worsening course since that time. The patient noted no past surgery on the right knee(s).  Patient currently rates pain in the right knee(s) at 7 out of 10 with activity. Patient has night pain, worsening of pain with activity and weight bearing and pain that interferes with activities of daily living.  Patient has evidence of subchondral sclerosis and joint space narrowing by imaging studies. There is no active infection.  Patient Active Problem List   Diagnosis Date Noted  . Loss of weight 03/18/2016  . Urinary frequency 03/18/2016  . Annual physical exam 11/25/2015  . CKD (chronic kidney disease) stage 3, GFR 30-59 ml/min 01/15/2015  . Osteoarthritis of right knee 05/04/2014  . At high risk for falls 05/04/2014  . PVD (peripheral vascular disease) ([place]) 06/08/2011  . ABNORMAL ELECTROCARDIOGRAM 09/19/2009  . Type 2 diabetes, diet controlled ([place]) 12/04/2007  . Essential hypertension 10/14/2006  . OSTEOARTHRITIS 10/14/2006  . OSTEOPENIA 10/14/2006  . COLONIC POLYPS, HX OF 10/14/2006   Past Medical History  Diagnosis Date  . Osteopenia   . Arthritis of knee     bilateral    . Diabetes mellitus, type 2 ([place])   . Hypertension   . Personal history of colonic polyps 3 years ago     colonoscopy   . Chronic renal insufficiency   . PAD (peripheral artery disease) [facility])     Past Surgical History  Procedure Laterality Date  . Total abdominal hysterectomy      bleeding   . Left knee replacement   08/14/08    Dr. Aline Brochure   . Left knee manipulation  under anasthesia due to contracture -  03/08/08    Dr. Aline Brochure   . Bunionectomy bilateral  feet    . Wisdom tooth extraction    . Dental surgery    . Gum disease  2010    treated by Dr. Andree Elk      (Not in a hospital admission) Allergies  Allergen Reactions  . Fosamax [Alendronate Sodium] Other (See Comments)    Pt reports excessive urination on the day she took the pill    Social History  Substance Use Topics  . Smoking status: Former Research scientist (life sciences)  . Smokeless tobacco: Not on file     Comment: smoked for 15 years-not smoked since 1998  . Alcohol Use: No    Family History  Problem Relation Age of Onset  . Colon cancer Father   . Cancer Mother   . Stroke Mother   . Hypertension Brother      Review of Systems  Constitutional: Negative.   HENT: Negative.   Eyes: Negative.   Respiratory: Negative.   Cardiovascular: Negative.   Gastrointestinal: Negative.   Genitourinary: Negative.   Musculoskeletal: Positive for joint pain.  Skin: Negative.   Neurological: Negative.   Endo/Heme/Allergies: Negative.   Psychiatric/Behavioral: Negative.     Objective:  Physical Exam  Constitutional: She is oriented to person, place, and time. She appears well-developed and well-nourished.  HENT:  Head: Normocephalic and atraumatic.  Eyes: EOM are normal. Pupils  are equal, round, and reactive to light.  Neck: Normal range of motion. Neck supple.  Cardiovascular: Normal rate and regular rhythm.   Respiratory: Effort normal and breath sounds normal.  GI: Soft. Bowel sounds are normal.  Musculoskeletal:  Examination of her right knee reveals range of motion from 0-120 degrees.  No joint line tenderness.  Moderate patellofemoral crepitus.  She is neurovascularly intact distally.    Neurological: She is alert and oriented to person, place, and time.  Skin: Skin is warm and dry.  Psychiatric: She has a normal mood and affect. Her behavior  is normal. Judgment and thought content normal.    Vital signs in last 24 hours: @VSRANGES @  Labs:   Estimated body mass index is 28.45 kg/(m^2) as calculated from the following:   Height as of 04/14/16: 5' 6.5" (1.689 m).   Weight as of 04/14/16: 81.149 kg (178 lb 14.4 oz).   Imaging Review Plain radiographs demonstrate severe degenerative joint disease of the right knee(s). The overall alignment ismild varus. The bone quality appears to be fair for age and reported activity level.  Assessment/Plan:  End stage arthritis, right knee   The patient history, physical examination, clinical judgment of the provider and imaging studies are consistent with end stage degenerative joint disease of the right knee(s) and total knee arthroplasty is deemed medically necessary. The treatment options including medical management, injection therapy arthroscopy and arthroplasty were discussed at length. The risks and benefits of total knee arthroplasty were presented and reviewed. The risks due to aseptic loosening, infection, stiffness, patella tracking problems, thromboembolic complications and other imponderables were discussed. The patient acknowledged the explanation, agreed to proceed with the plan and consent was signed. Patient is being admitted for inpatient treatment for surgery, pain control, PT, OT, prophylactic antibiotics, VTE prophylaxis, progressive ambulation and ADL's and discharge planning. The patient is planning to be discharged home with home health services

## 2016-04-27 ENCOUNTER — Other Ambulatory Visit: Payer: Self-pay | Admitting: Family Medicine

## 2016-04-28 ENCOUNTER — Ambulatory Visit (HOSPITAL_COMMUNITY): Payer: Medicare Other

## 2016-04-28 DIAGNOSIS — R262 Difficulty in walking, not elsewhere classified: Secondary | ICD-10-CM | POA: Diagnosis present

## 2016-04-28 DIAGNOSIS — Z9181 History of falling: Secondary | ICD-10-CM

## 2016-04-28 DIAGNOSIS — R29898 Other symptoms and signs involving the musculoskeletal system: Secondary | ICD-10-CM

## 2016-04-28 DIAGNOSIS — R269 Unspecified abnormalities of gait and mobility: Secondary | ICD-10-CM

## 2016-04-28 DIAGNOSIS — M6281 Muscle weakness (generalized): Secondary | ICD-10-CM | POA: Diagnosis present

## 2016-04-28 DIAGNOSIS — R2681 Unsteadiness on feet: Secondary | ICD-10-CM

## 2016-04-28 DIAGNOSIS — R2689 Other abnormalities of gait and mobility: Secondary | ICD-10-CM

## 2016-04-28 NOTE — Therapy (Signed)
[place], [place], [address] Phone: [phone]   Fax:  [phone]  Physical Therapy Treatment  Patient Details  Name: [name] MRN: [id number] Date of Birth: December 20, 1933 Referring Provider: Dr. Kathryne Hitch  Encounter Date: 04/28/2016      PT End of Session - 04/28/16 1638    Visit Number 4   Number of Visits 8   Date for PT Re-Evaluation 05/08/16   Authorization Type UHC medicare    Authorization - Visit Number 4   Authorization - Number of Visits 8   PT Start Time [id number]   PT Stop Time 1448   PT Time Calculation (min) 41 min   Activity Tolerance Patient tolerated treatment well   Behavior During Therapy [facility] for tasks assessed/performed      Past Medical History  Diagnosis Date  . Osteopenia   . Arthritis of knee     bilateral    . Diabetes mellitus, type 2 ([place])   . Hypertension   . Personal history of colonic polyps 3 years ago     colonoscopy   . Chronic renal insufficiency   . PAD (peripheral artery disease) [facility])     Past Surgical History  Procedure Laterality Date  . Total abdominal hysterectomy      bleeding   . Left knee replacement  08/14/08    Dr. Aline Brochure   . Left knee manipulation  under anasthesia due to contracture -  03/08/08    Dr. Aline Brochure   . Bunionectomy bilateral  feet    . Wisdom tooth extraction    . Dental surgery    . Gum disease  2010    treated by Dr. Andree Elk     There were no vitals filed for this visit.      Subjective Assessment - 04/28/16 1416    Subjective Pt was late to appointment today, and states that her HEP haven't been going great.  She forgets to do the exercises in the evenings.     Pertinent History Lt TKR, OA, HTN    How long can you sit comfortably? no problem   How long can you stand comfortably? for less than two minutes    How long can you walk comfortably? for less than one minute    Currently in Pain? No/denies                          [facility] Adult PT Treatment/Exercise - 04/28/16 0001    Knee/Hip Exercises: Stretches   Passive Hamstring Stretch Both;3 reps;30 seconds  seated   Gastroc Stretch Both;3 reps;30 seconds  seated with rope pulling back on toes.    Knee/Hip Exercises: Supine   Quad Sets Both;2 sets;10 reps  5 sec hold & with towel roll under ankle.    Short Loss adjuster, chartered sets;10 reps  with 5 sec hold with bolster   Straight Leg Raises Strengthening;2 sets;10 reps  Min A to brace opposite LE.  Only able to do 1/2 range.   Knee/Hip Exercises: Sidelying   Clams 2 x 10 reps Both sides with Mod A to prevent hips from rollng back.    Other Sidelying Knee/Hip Exercises Reverse clams 1 x 10 reps with Mod A to prevent hips from rolling back                PT Education - 04/28/16 1637    Education provided Yes  Education Details Educated pt on correct technique for therapeutic exercises, and encouraged pt to perform HEP 2x's per day to get the most out of her therapy.    Person(s) Educated Patient   Methods Explanation;Demonstration;Tactile cues   Comprehension Verbalized understanding;Returned demonstration          PT Short Term Goals - 04/28/16 1642    PT SHORT TERM GOAL #1   Title Pt to be independent in HEP in order to increased strength to decrease falls    Time 1   Period Weeks   Status New   PT SHORT TERM GOAL #2   Title Pt to be able to walk 30 ft in 30 seconds to demonstrate increased confidence of walking    Time 3   Period Weeks   Status New   PT SHORT TERM GOAL #3   Title Pt strength to be improved 1/2 grade to be able to come sit to stand while seated on higher chairs without UE assist  for improved mobility to decrease fall risk    Time 3   Period Weeks   Status New   PT SHORT TERM GOAL #4   Title Pt to be able to SLS x 5 seconds for reduced risk of falls    Time 3   Period Weeks   Status New   PT SHORT TERM GOAL #5    Title Pt to feel comforable walking with her quad cane away from the wall for improved confidence in walking independently for improved quality of life.    Time 3   Period Weeks   Status New           PT Long Term Goals - 04/28/16 1644    PT LONG TERM GOAL #1   Title Pt to be independent in advance HEP in order to obtain goals   Time 4   Period Weeks   Status New   PT LONG TERM GOAL #2   Title Pt to be able to walk with her quad cane for 5 mintues for reduce risk of falls in her home     Time 4   Status New   PT LONG TERM GOAL #3   Title Pt to be able to walk 30 ft in 25 seconds to demonstrate improved balance and strength for decreased risk of falls    Time 4   Period Weeks   Status New   PT LONG TERM GOAL #4   Title Pt to demonstrate improved power by stating that she is able get up from her couch without difficulty.    Time 6   Period Weeks   Status New   PT LONG TERM GOAL #5   Title Pt  5 sit to stand test to be less than 20 seconds to demonstrate decreased fall riskl   Time 6   Period Weeks   Status New               Plan - 04/28/16 1639    Clinical Impression Statement Continued progression of Quad strengthening bilaterally.  Pt continues to demonstrate gross B LE strength deficits, however she gave great effort for all exercises today.  She does not have any pain to c/o today.  Encouraged pt to remember to do her exercises 2x's/day as instructed.  Pt will need to continue progression of LE strengthening to ease rehab process after her TKA, and improve general functional mobility.     Rehab Potential Good   PT Frequency  2x / week   PT Duration 4 weeks   PT Treatment/Interventions ADLs/Self Care Home Management;Cryotherapy;Moist Heat;Gait training;Functional mobility training;Therapeutic activities;Therapeutic exercise;Balance training;Neuromuscular re-education;Patient/family education;Manual techniques;Passive range of motion   PT Next Visit Plan continue to  progress with balance training in tandem stance, and narrow BOS.    PT Home Exercise Plan unchanged - encouraged to complete 2x's/day   Consulted and Agree with Plan of Care Patient      Patient will benefit from skilled therapeutic intervention in order to improve the following deficits and impairments:  Abnormal gait, Decreased activity tolerance, Decreased balance, Decreased endurance, Decreased mobility, Decreased strength, Difficulty walking, Pain, Impaired flexibility  Visit Diagnosis: Difficulty in walking, not elsewhere classified  Unsteadiness on feet  History of falling  Muscle weakness (generalized)  Abnormality of gait  Risk for falls  Poor balance  Leg weakness, bilateral     Problem List Patient Active Problem List   Diagnosis Date Noted  . Loss of weight 03/18/2016  . Urinary frequency 03/18/2016  . Annual physical exam 11/25/2015  . CKD (chronic kidney disease) stage 3, GFR 30-59 ml/min 01/15/2015  . Osteoarthritis of right knee 05/04/2014  . At high risk for falls 05/04/2014  . PVD (peripheral vascular disease) ([place]) 06/08/2011  . ABNORMAL ELECTROCARDIOGRAM 09/19/2009  . Type 2 diabetes, diet controlled ([place]) 12/04/2007  . Essential hypertension 10/14/2006  . OSTEOARTHRITIS 10/14/2006  . OSTEOPENIA 10/14/2006  . COLONIC POLYPS, HX OF 10/14/2006    Beth Raiana Pharris, PT, DPT X: [phone]   [place] [address] [place], [place], [address] Phone: [phone]   Fax:  [phone]  Name: [name] MRN: [id number] Date of Birth: 13-Jan-1934

## 2016-04-30 ENCOUNTER — Ambulatory Visit (HOSPITAL_COMMUNITY): Payer: Medicare Other

## 2016-04-30 ENCOUNTER — Encounter (HOSPITAL_COMMUNITY)
Admission: RE | Admit: 2016-04-30 | Discharge: 2016-04-30 | Disposition: A | Discharge status: Home / Self Care | Payer: Medicare Other | Source: Ambulatory Visit | Attending: Orthopedic Surgery | Admitting: Orthopedic Surgery

## 2016-04-30 DIAGNOSIS — Z01812 Encounter for preprocedural laboratory examination: Secondary | ICD-10-CM | POA: Diagnosis present

## 2016-04-30 DIAGNOSIS — M1711 Unilateral primary osteoarthritis, right knee: Secondary | ICD-10-CM | POA: Insufficient documentation

## 2016-04-30 LAB — CBC
HCT: 35.7 % — ABNORMAL LOW (ref 36.0–46.0)
HEMOGLOBIN: 11.5 g/dL — AB (ref 12.0–15.0)
MCH: 31 pg (ref 26.0–34.0)
MCHC: 32.2 g/dL (ref 30.0–36.0)
MCV: 96.2 fL (ref 78.0–100.0)
PLATELETS: 239 10*3/uL (ref 150–400)
RBC: 3.71 MIL/uL — AB (ref 3.87–5.11)
RDW: 15 % (ref 11.5–15.5)
WBC: 5.5 10*3/uL (ref 4.0–10.5)

## 2016-04-30 LAB — BASIC METABOLIC PANEL
ANION GAP: 9 (ref 5–15)
BUN: 31 mg/dL — AB (ref 6–20)
CHLORIDE: 106 mmol/L (ref 101–111)
CO2: 26 mmol/L (ref 22–32)
Calcium: 9.3 mg/dL (ref 8.9–10.3)
Creatinine, Ser: 1.37 mg/dL — ABNORMAL HIGH (ref 0.44–1.00)
GFR calc Af Amer: 41 mL/min — ABNORMAL LOW (ref >60)
GFR, EST NON AFRICAN AMERICAN: 36 mL/min — AB (ref >60)
Glucose, Bld: 101 mg/dL — ABNORMAL HIGH (ref 65–99)
POTASSIUM: 3.7 mmol/L (ref 3.5–5.1)
SODIUM: 141 mmol/L (ref 135–145)

## 2016-04-30 LAB — SURGICAL PCR SCREEN
MRSA, PCR: NEGATIVE
Staphylococcus aureus: NEGATIVE

## 2016-04-30 LAB — TYPE AND SCREEN
ABO/RH(D): O POS
ANTIBODY SCREEN: NEGATIVE

## 2016-04-30 LAB — GLUCOSE, CAPILLARY: Glucose-Capillary: 115 mg/dL — ABNORMAL HIGH (ref 65–99)

## 2016-04-30 NOTE — Pre-Procedure Instructions (Signed)
    [name]  04/30/2016      [facility], [place] - [id number] [place] [address] Phone: [phone] Fax: [phone]    Your procedure is scheduled on Wednesday, May 12, 2016  Report to [facility] Admitting at 11:15 A.M.  Call this number if you have problems the morning of surgery:  [phone]   Remember:  Do not eat food or drink liquids after midnight, Tuesday, May 11, 2016  Take these medicines the morning of surgery with  SIP OF WATER  Tylenol as needed   Do not wear jewelry, make-up or nail polish.  Do not wear lotions, powders, or perfumes.  You may wear deoderant.  Do not shave 48 hours prior to surgery.  .  Do not bring valuables to the hospital.  [facility] is not responsible for any belongings or valuables.  Contacts, dentures or bridgework may not be worn into surgery.  Leave your suitcase in the car.  After surgery it may be brought to your room.  For patients admitted to the hospital, discharge time will be determined by your treatment team.  Patients discharged the day of surgery will not be allowed to drive home.   Name and phone number of your driver:   Special instructions:   Please read over the following fact sheets that you were given. Pain Booklet, Coughing and Deep Breathing, Blood Transfusion Information and Surgical Site Infection Prevention

## 2016-05-01 LAB — ABO/RH: ABO/RH(D): O POS

## 2016-05-04 ENCOUNTER — Ambulatory Visit (HOSPITAL_COMMUNITY): Payer: Medicare Other

## 2016-05-04 DIAGNOSIS — R262 Difficulty in walking, not elsewhere classified: Secondary | ICD-10-CM

## 2016-05-04 DIAGNOSIS — Z9181 History of falling: Secondary | ICD-10-CM

## 2016-05-04 DIAGNOSIS — M6281 Muscle weakness (generalized): Secondary | ICD-10-CM

## 2016-05-04 DIAGNOSIS — R2681 Unsteadiness on feet: Secondary | ICD-10-CM

## 2016-05-04 NOTE — Progress Notes (Signed)
Anesthesia Chart Review: Patient is a 82 year old female scheduled for right TKA on 05/12/16 by Dr. Kathryne Hitch.  History includes former smoker, HTN, DM2 (diet controlled), CKD, PAD, hysterectomy, left TKA '09.   PCP is Dr. Tula Nakayama who medically cleared patient for this procedure. She was referred to cardiologist Dr. Angelena Form for pre-operative cardiology evaluation. Per office visit 04/13/16, "Pre-operative cardiovascular examination: She has no chest pain, signs of heart failure or significant arrhythmias. She is functional but limited by knee pain. I do not think ischemic testing is necessary before her planned right knee replacement. I would proceed with the planned surgical procedure in July." 6 month follow-up for PAD recommended.  Meds include ASA 81 mg, lisinopril-HCTZ, MVI.  04/13/16 EKG: SR with PAC's, low voltage QRS.  09/23/09 Echo: Study Conclusions 1. Left ventricle: The cavity size was normal. Wall thickness was  increased in a pattern of mild LVH. Systolic function was normal.  The estimated ejection fraction was in the range of 60% to 65%.  Wall motion was normal; there were no regional wall motion  abnormalities. Doppler parameters are consistent with abnormal  left ventricular relaxation (grade 1 diastolic dysfunction). 2. Aortic valve: Mildly calcified annulus. Trileaflet. 3. Mitral valve: Calcified annulus. Mildly thickened leaflets . Mild  regurgitation directed eccentrically. 4. Left atrium: The atrium was mildly dilated. 5. Right ventricle: The cavity size was mildly dilated. Systolic  function was normal. 6. Tricuspid valve: Trivial regurgitation.  03/18/16 CXR: IMPRESSION: No acute cardiopulmonary disease.  Preoperative labs noted. BUN 31, Cr 1.37. Glucose 101. H/H 11.5/35.7. UA WNL on 03/18/16. A1C was 6.0 on 03/18/16.  If no acute changes then I anticipate that she can proceed as planned.  George Hugh [facility] Short Stay  Center/Anesthesiology Phone [phone] 05/04/2016 1:30 PM

## 2016-05-04 NOTE — Therapy (Signed)
[place], [place], [address] Phone: [phone]   Fax:  [phone]  Physical Therapy Treatment  Patient Details  Name: [name] MRN: [id number] Date of Birth: August 04, 1934 Referring Provider: Dr. Kathryne Hitch  Encounter Date: 05/04/2016      PT End of Session - 05/04/16 1405    Visit Number 5   Number of Visits 8   Date for PT Re-Evaluation 05/08/16   Authorization Type UHC medicare    Authorization - Visit Number 5   Authorization - Number of Visits 8   PT Start Time [id number]  pt late for apt   PT Stop Time 1430   PT Time Calculation (min) 32 min   Activity Tolerance Patient tolerated treatment well   Behavior During Therapy [facility] for tasks assessed/performed      Past Medical History  Diagnosis Date  . Osteopenia   . Arthritis of knee     bilateral    . Diabetes mellitus, type 2 ([place])   . Hypertension   . Personal history of colonic polyps 3 years ago     colonoscopy   . Chronic renal insufficiency   . PAD (peripheral artery disease) [facility])     Past Surgical History  Procedure Laterality Date  . Total abdominal hysterectomy      bleeding   . Left knee replacement  08/14/08    Dr. Aline Brochure   . Left knee manipulation  under anasthesia due to contracture -  03/08/08    Dr. Aline Brochure   . Bunionectomy bilateral  feet    . Wisdom tooth extraction    . Dental surgery    . Gum disease  2010    treated by Dr. Andree Elk     There were no vitals filed for this visit.      Subjective Assessment - 05/04/16 1356    Subjective Pt late for apt today, reports she is currently pain free.  Reports she has been compliant with HEP, continues to have difficulty with bridges.  Reports plans for knee replacement next week.   Pertinent History Lt TKR, OA, HTN    Currently in Pain? No/denies             [facility] Adult PT Treatment/Exercise - 05/04/16 0001    Knee/Hip Exercises: Standing   Other Standing  Knee Exercises Tandem stance 3x 30" (pt reluctant to let go with UE due to fear of knee buckling)   Knee/Hip Exercises: Seated   Sit to Sand 10 reps;without UE support   Knee/Hip Exercises: Supine   Short Arc Quad Sets 15 reps;Both   Bridges Both;2 sets;10 reps   Straight Leg Raises Strengthening;2 sets;10 reps   Knee/Hip Exercises: Sidelying   Clams 2 x 10 reps Both sides with Mod A to prevent hips from rollng back.             PT Short Term Goals - 04/28/16 1642    PT SHORT TERM GOAL #1   Title Pt to be independent in HEP in order to increased strength to decrease falls    Time 1   Period Weeks   Status New   PT SHORT TERM GOAL #2   Title Pt to be able to walk 30 ft in 30 seconds to demonstrate increased confidence of walking    Time 3   Period Weeks   Status New   PT SHORT TERM GOAL #3   Title Pt strength to be improved  1/2 grade to be able to come sit to stand while seated on higher chairs without UE assist  for improved mobility to decrease fall risk    Time 3   Period Weeks   Status New   PT SHORT TERM GOAL #4   Title Pt to be able to SLS x 5 seconds for reduced risk of falls    Time 3   Period Weeks   Status New   PT SHORT TERM GOAL #5   Title Pt to feel comforable walking with her quad cane away from the wall for improved confidence in walking independently for improved quality of life.    Time 3   Period Weeks   Status New           PT Long Term Goals - 04/28/16 1644    PT LONG TERM GOAL #1   Title Pt to be independent in advance HEP in order to obtain goals   Time 4   Period Weeks   Status New   PT LONG TERM GOAL #2   Title Pt to be able to walk with her quad cane for 5 mintues for reduce risk of falls in her home     Time 4   Status New   PT LONG TERM GOAL #3   Title Pt to be able to walk 30 ft in 25 seconds to demonstrate improved balance and strength for decreased risk of falls    Time 4   Period Weeks   Status New   PT LONG TERM GOAL #4    Title Pt to demonstrate improved power by stating that she is able get up from her couch without difficulty.    Time 6   Period Weeks   Status New   PT LONG TERM GOAL #5   Title Pt  5 sit to stand test to be less than 20 seconds to demonstrate decreased fall riskl   Time 6   Period Weeks   Status New               Plan - 05/04/16 1405    Clinical Impression Statement Continued session focus on progressing Bil LE strengthening prior plans for Rt TKR next week.  Pt continues to demonstrate gross BLE strength deficits with extra effort to complete exercises today.  Pt reminded importance of continuing to complete HEP for strengthneing to assist following TKR.  Attempted progressing to balance training, pt reluctatnt to reduce UE A with tandem stance due to fear of Rt knee buckling.  No reports of increased pain through session.     Rehab Potential Good   PT Frequency 2x / week   PT Duration 4 weeks   PT Treatment/Interventions ADLs/Self Care Home Management;Cryotherapy;Moist Heat;Gait training;Functional mobility training;Therapeutic activities;Therapeutic exercise;Balance training;Neuromuscular re-education;Patient/family education;Manual techniques;Passive range of motion   PT Next Visit Plan continue strengthening and progress with balance training in tandem stance, and narrow BOS.       Patient will benefit from skilled therapeutic intervention in order to improve the following deficits and impairments:  Abnormal gait, Decreased activity tolerance, Decreased balance, Decreased endurance, Decreased mobility, Decreased strength, Difficulty walking, Pain, Impaired flexibility  Visit Diagnosis: Difficulty in walking, not elsewhere classified  Unsteadiness on feet  History of falling  Muscle weakness (generalized)     Problem List Patient Active Problem List   Diagnosis Date Noted  . Loss of weight 03/18/2016  . Urinary frequency 03/18/2016  . Annual physical exam  11/25/2015  .  CKD (chronic kidney disease) stage 3, GFR 30-59 ml/min 01/15/2015  . Osteoarthritis of right knee 05/04/2014  . At high risk for falls 05/04/2014  . PVD (peripheral vascular disease) ([place]) 06/08/2011  . ABNORMAL ELECTROCARDIOGRAM 09/19/2009  . Type 2 diabetes, diet controlled ([place]) 12/04/2007  . Essential hypertension 10/14/2006  . OSTEOARTHRITIS 10/14/2006  . OSTEOPENIA 10/14/2006  . COLONIC POLYPS, HX OF 10/14/2006   Ihor Austin, LPTA; [place]  Aldona Lento 05/04/2016, 3:48 PM  [place], [place], [address] Phone: [phone]   Fax:  [phone]  Name: ANIVEA LUDGATE MRN: [id number] Date of Birth: [birth year]

## 2016-05-06 ENCOUNTER — Ambulatory Visit (HOSPITAL_COMMUNITY): Admit: 2016-05-06 | Payer: Medicare Other | Admitting: Internal Medicine

## 2016-05-06 ENCOUNTER — Ambulatory Visit (HOSPITAL_COMMUNITY): Payer: Medicare Other

## 2016-05-06 ENCOUNTER — Encounter (HOSPITAL_COMMUNITY): Payer: Self-pay

## 2016-05-06 DIAGNOSIS — R269 Unspecified abnormalities of gait and mobility: Secondary | ICD-10-CM

## 2016-05-06 DIAGNOSIS — R262 Difficulty in walking, not elsewhere classified: Secondary | ICD-10-CM | POA: Diagnosis not present

## 2016-05-06 DIAGNOSIS — Z9181 History of falling: Secondary | ICD-10-CM

## 2016-05-06 DIAGNOSIS — R2681 Unsteadiness on feet: Secondary | ICD-10-CM

## 2016-05-06 DIAGNOSIS — M6281 Muscle weakness (generalized): Secondary | ICD-10-CM

## 2016-05-06 SURGERY — COLONOSCOPY
Anesthesia: Moderate Sedation

## 2016-05-06 NOTE — Therapy (Signed)
[place], [place], [address] Phone: [phone]   Fax:  [phone]  Physical Therapy Treatment  Patient Details  Name: [name] MRN: [id number] Date of Birth: 11-Dec-1933 Referring Provider: Dr. Kathryne Hitch  Encounter Date: 05/06/2016      PT End of Session - 05/06/16 1015    Visit Number 6   Number of Visits 8   Date for PT Re-Evaluation 05/08/16   Authorization Type UHC medicare    Authorization - Visit Number 6   Authorization - Number of Visits 8   PT Start Time [id number]   PT Stop Time 1029   PT Time Calculation (min) 31 min   Activity Tolerance Patient tolerated treatment well   Behavior During Therapy [facility] for tasks assessed/performed      Past Medical History  Diagnosis Date  . Osteopenia   . Arthritis of knee     bilateral    . Diabetes mellitus, type 2 ([place])   . Hypertension   . Personal history of colonic polyps 3 years ago     colonoscopy   . Chronic renal insufficiency   . PAD (peripheral artery disease) [facility])     Past Surgical History  Procedure Laterality Date  . Total abdominal hysterectomy      bleeding   . Left knee replacement  08/14/08    Dr. Aline Brochure   . Left knee manipulation  under anasthesia due to contracture -  03/08/08    Dr. Aline Brochure   . Bunionectomy bilateral  feet    . Wisdom tooth extraction    . Dental surgery    . Gum disease  2010    treated by Dr. Andree Elk     There were no vitals filed for this visit.      Subjective Assessment - 05/06/16 1014    Subjective Pt late for apt today.  Stated she is pain free today, feeling a little anxious about TKR schedulded for next week.   Pertinent History Lt TKR, OA, HTN    Currently in Pain? No/denies            [facility] Adult PT Treatment/Exercise - 05/06/16 0001    Knee/Hip Exercises: Standing   Forward Step Up Both;10 reps;Hand Hold: 2;Step Height: 4"   Knee/Hip Exercises: Seated   Long Arc Quad  Strengthening;Both;15 reps   Long Arc Quad Weight 5 lbs.   Sit to [employer] 10 reps;without UE support   Knee/Hip Exercises: Supine   Quad Sets 15 reps   Short Arc Quad Sets 15 reps   Bridges 15 reps   Straight Leg Raises Strengthening;2 sets;10 reps            PT Short Term Goals - 04/28/16 1642    PT SHORT TERM GOAL #1   Title Pt to be independent in HEP in order to increased strength to decrease falls    Time 1   Period Weeks   Status New   PT SHORT TERM GOAL #2   Title Pt to be able to walk 30 ft in 30 seconds to demonstrate increased confidence of walking    Time 3   Period Weeks   Status New   PT SHORT TERM GOAL #3   Title Pt strength to be improved 1/2 grade to be able to come sit to stand while seated on higher chairs without UE assist  for improved mobility to decrease fall risk    Time 3  Period Weeks   Status New   PT SHORT TERM GOAL #4   Title Pt to be able to SLS x 5 seconds for reduced risk of falls    Time 3   Period Weeks   Status New   PT SHORT TERM GOAL #5   Title Pt to feel comforable walking with her quad cane away from the wall for improved confidence in walking independently for improved quality of life.    Time 3   Period Weeks   Status New           PT Long Term Goals - 04/28/16 1644    PT LONG TERM GOAL #1   Title Pt to be independent in advance HEP in order to obtain goals   Time 4   Period Weeks   Status New   PT LONG TERM GOAL #2   Title Pt to be able to walk with her quad cane for 5 mintues for reduce risk of falls in her home     Time 4   Status New   PT LONG TERM GOAL #3   Title Pt to be able to walk 30 ft in 25 seconds to demonstrate improved balance and strength for decreased risk of falls    Time 4   Period Weeks   Status New   PT LONG TERM GOAL #4   Title Pt to demonstrate improved power by stating that she is able get up from her couch without difficulty.    Time 6   Period Weeks   Status New   PT LONG TERM GOAL #5    Title Pt  5 sit to stand test to be less than 20 seconds to demonstrate decreased fall riskl   Time 6   Period Weeks   Status New               Plan - 05/06/16 1024    Clinical Impression Statement Session focus on Bil LE strengthening especially quadriceps and gluteal musculature.  Pt continues to demonstrate significant gross strength deficitis with therapist facilitation required to improve form with majority of exercises. Pt unable to complete SLR without extension lagging.  Reinforced importance of continuing HEP prior surgery for maximal benefits.  No reports of pain through session.     Rehab Potential Good   PT Frequency 2x / week   PT Duration 4 weeks   PT Treatment/Interventions ADLs/Self Care Home Management;Cryotherapy;Moist Heat;Gait training;Functional mobility training;Therapeutic activities;Therapeutic exercise;Balance training;Neuromuscular re-education;Patient/family education;Manual techniques;Passive range of motion   PT Next Visit Plan continue strengthening and progress with balance training in tandem stance, and narrow BOS.       Patient will benefit from skilled therapeutic intervention in order to improve the following deficits and impairments:  Abnormal gait, Decreased activity tolerance, Decreased balance, Decreased endurance, Decreased mobility, Decreased strength, Difficulty walking, Pain, Impaired flexibility  Visit Diagnosis: Difficulty in walking, not elsewhere classified  Unsteadiness on feet  History of falling  Muscle weakness (generalized)  Abnormality of gait     Problem List Patient Active Problem List   Diagnosis Date Noted  . Loss of weight 03/18/2016  . Urinary frequency 03/18/2016  . Annual physical exam 11/25/2015  . CKD (chronic kidney disease) stage 3, GFR 30-59 ml/min 01/15/2015  . Osteoarthritis of right knee 05/04/2014  . At high risk for falls 05/04/2014  . PVD (peripheral vascular disease) ([place]) 06/08/2011  . ABNORMAL  ELECTROCARDIOGRAM 09/19/2009  . Type 2 diabetes, diet controlled ([place]) 12/04/2007  .  Essential hypertension 10/14/2006  . OSTEOARTHRITIS 10/14/2006  . OSTEOPENIA 10/14/2006  . COLONIC POLYPS, HX OF 10/14/2006   Ihor Austin, LPTA; [place]  Aldona Lento 05/06/2016, 10:36 AM  [place], [place], [address] Phone: [phone]   Fax:  [phone]  Name: [name] MRN: [id number] Date of Birth: 1933/11/05

## 2016-05-10 ENCOUNTER — Ambulatory Visit (HOSPITAL_COMMUNITY): Payer: Medicare Other | Admitting: Physical Therapy

## 2016-05-10 DIAGNOSIS — R2681 Unsteadiness on feet: Secondary | ICD-10-CM

## 2016-05-10 DIAGNOSIS — M6281 Muscle weakness (generalized): Secondary | ICD-10-CM

## 2016-05-10 DIAGNOSIS — Z9181 History of falling: Secondary | ICD-10-CM

## 2016-05-10 DIAGNOSIS — R262 Difficulty in walking, not elsewhere classified: Secondary | ICD-10-CM

## 2016-05-10 NOTE — Therapy (Signed)
[place], [place], [address] Phone: [phone]   Fax:  [phone]  Physical Therapy Treatment (Discharge)  Patient Details  Name: [name] MRN: [id number] Date of Birth: Oct 07, 1934 Referring Provider: Dr. Kathryne Hitch  Encounter Date: 05/10/2016      PT End of Session - 05/10/16 1217    Visit Number 7   Number of Visits 7   Authorization Type [place] - Visit Number 7   Authorization - Number of Visits 10   PT Start Time 1120   PT Stop Time 1200   PT Time Calculation (min) 40 min   Activity Tolerance Patient tolerated treatment well   Behavior During Therapy [facility] for tasks assessed/performed      Past Medical History:  Diagnosis Date  . Arthritis of knee    bilateral    . Chronic renal insufficiency   . Diabetes mellitus, type 2 ([place])   . Hypertension   . Osteopenia   . PAD (peripheral artery disease) ([place])   . Personal history of colonic polyps 3 years ago    colonoscopy     Past Surgical History:  Procedure Laterality Date  . bunionectomy bilateral  feet    . DENTAL SURGERY    . gum disease  2010   treated by Dr. Andree Elk   . Left knee manipulation  under anasthesia due to contracture -  03/08/08   Dr. Aline Brochure   . left knee replacement  08/14/08   Dr. Aline Brochure   . TOTAL ABDOMINAL HYSTERECTOMY     bleeding   . WISDOM TOOTH EXTRACTION      There were no vitals filed for this visit.      Subjective Assessment - 05/10/16 1124    Subjective Patient reports she is feeling better and states that her knee is feeling much better. She states that she does not have to deal with stairs due to having ramps at home; her balance is better and she is feeling more confident. On a subjective scale she rates herself as being around 85/100; she thinks her main problem is pain in her left leg.    Pertinent History Lt TKR, OA, HTN    How long can you sit comfortably? 7/24- 15 -20  minutes    How long can you stand comfortably? 7/24- "pretty good while but I haven't timed it"    How long can you walk comfortably? 7/24- as long as she has comfortable shoes, at least 500-649f (long hallway at [facility])   Currently in Pain? No/denies            [facility] - 05[facility]10[facility]16 0001      Observation/Other Assessments   Focus on Therapeutic Outcomes (FOTO)  65     AROM   Right Knee Extension 6   Right Knee Flexion 117   Left Knee Extension 16   Left Knee Flexion 105     Strength   Right Hip Flexion 3/5   Right Hip ABduction 3/5  hip flexor compensation    Left Hip Extension 3-/5   Left Hip ABduction 2/5   Right Knee Flexion 2+/5   Right Knee Extension 4/5   Left Knee Flexion 2+/5   Left Knee Extension 4/5   Right Ankle Dorsiflexion 4/5   Left Ankle Dorsiflexion 3/5     6 minute walk test results    Aerobic Endurance Distance Walked 195   Endurance additional comments 3MWT,  walker      High Level Balance   High Level Balance Comments TUG 40 seconds with walker                              PT Education - 05/10/16 1216    Education provided Yes   Education Details progress wtih skilled services, keep up with HEP up to date of surgery, improtance of participation with DPT/rehab post surgery    Person(s) Educated Patient   Methods Explanation   Comprehension Verbalized understanding          PT Short Term Goals - 05/10/16 1152      PT SHORT TERM GOAL #1   Title Pt to be independent in HEP in order to increased strength to decrease falls    Baseline 7/24- no falls, has been doing exercises every day    Time 1   Period Weeks   Status Achieved     PT SHORT TERM GOAL #2   Title Pt to be able to walk 30 ft in 30 seconds to demonstrate increased confidence of walking    Baseline 7/24- 78f in 30 seconds    Time 3   Period Weeks   Status Achieved     PT SHORT TERM GOAL #3   Title Pt strength to be improved 1/2 grade to be  able to come sit to stand while seated on higher chairs without UE assist  for improved mobility to decrease fall risk    Baseline 7/24- patient reports inconsistent abilty to sit to stand without UEs    Time 3   Period Weeks   Status On-going     PT SHORT TERM GOAL #4   Title Pt to be able to SLS x 5 seconds for reduced risk of falls    Baseline 7/24- DNT    Period Weeks   Status Deferred     PT SHORT TERM GOAL #5   Title Pt to feel comforable walking with her quad cane away from the wall for improved confidence in walking independently for improved quality of life.    Baseline 7/24- patient not using quad cane    Time 3   Period Weeks   Status On-going           PT Long Term Goals - 05/10/16 1156      PT LONG TERM GOAL #1   Title Pt to be independent in advance HEP in order to obtain goals   Time 4   Period Weeks   Status Achieved     PT LONG TERM GOAL #2   Title Pt to be able to walk with her quad cane for 5 mintues for reduce risk of falls in her home     Baseline 7/24- doing this in kitchen with surfaces nearby    Time 4   Period Weeks   Status Achieved     PT LONG TERM GOAL #3   Title Pt to be able to walk 30 ft in 25 seconds to demonstrate improved balance and strength for decreased risk of falls    Baseline 7/24- 27.537fin 25 seconds    Time 4   Period Weeks   Status On-going     PT LONG TERM GOAL #4   Title Pt to demonstrate improved power by stating that she is able get up from her couch without difficulty.    Baseline 7/24- still having trouble, usually sits  in straight back chair    Time 6   Period Weeks   Status On-going     PT LONG TERM GOAL #5   Title Pt  5 sit to stand test to be less than 20 seconds to demonstrate decreased fall riskl   Time 6   Status On-going               Plan - May 16, 2016 Dec 16, 1215    Clinical Impression Statement Discharge assessment today as patient is scheduled for surgery on 7/26; patient reports that overall she  is feeling much better and is able to move much more freely and safely around her home. Upon examination, patient does show improved ROM in targeted R knee, mild improvements in functional strength (variating measures due to inter-rater differences of MMT), and some improvement in gait speed/functional activity tolerance. Educated patient regarding importance of keeping up with HEP up to day of surgery as well as targeted exercise with DPT/rehabilitation after surgery. DC today due to surgery being in 2 days/last scheduled session.    Rehab Potential Good   PT Next Visit Plan DC today due to last scheduled session/surgery wednesday    PT Home Exercise Plan no changes    Consulted and Agree with Plan of Care Patient      Patient will benefit from skilled therapeutic intervention in order to improve the following deficits and impairments:  Abnormal gait, Decreased activity tolerance, Decreased balance, Decreased endurance, Decreased mobility, Decreased strength, Difficulty walking, Pain, Impaired flexibility  Visit Diagnosis: Difficulty in walking, not elsewhere classified  Unsteadiness on feet  History of falling  Muscle weakness (generalized)       G-Codes - 05/16/2016 1216/12/15    Functional Assessment Tool Used clinical judgement; (speed of walking and histroy of falling) and foto   Functional Limitation Mobility: Walking and moving around   Mobility: Walking and Moving Around Goal Status [phone]) At least 40 percent but less than 60 percent impaired, limited or restricted   Mobility: Walking and Moving Around Discharge Status [phone]) At least 40 percent but less than 60 percent impaired, limited or restricted      Problem List Patient Active Problem List   Diagnosis Date Noted  . Loss of weight 03/18/2016  . Urinary frequency 03/18/2016  . Annual physical exam 11/25/2015  . CKD (chronic kidney disease) stage 3, GFR 30-59 ml/min 01/15/2015  . Osteoarthritis of right knee 05/04/2014  .  At high risk for falls 05/04/2014  . PVD (peripheral vascular disease) ([place]) 06/08/2011  . ABNORMAL ELECTROCARDIOGRAM 09/19/2009  . Type 2 diabetes, diet controlled ([place]) 12/04/2007  . Essential hypertension 10/14/2006  . OSTEOARTHRITIS 10/14/2006  . OSTEOPENIA 10/14/2006  . COLONIC POLYPS, HX OF 10/14/2006   PHYSICAL THERAPY DISCHARGE SUMMARY  Visits from Start of Care: 7  Current functional level related to goals / functional outcomes: Patient states that she is doing well and feeling much better than she was before starting PT; remains anxious regarding surgery on 7/26 but feels ready for it. DC today due to last scheduled session/surgery planned for area of concern in 2 days.    Remaining deficits: Knee stiffness, poor posture, fall risk, muscle weakness, gait impairment    Education / Equipment: Keep up with current HEP until surgery; importance of participation with rehab/DPT after surgery for optimal outcomes  Plan: Patient agrees to discharge.  Patient goals were partially met. Patient is being discharged due to being pleased with the current functional level.  ?????  Deniece Ree PT, DPT [phone]  [facility] [address] [place], [place], [address] Phone: [phone]   Fax:  [phone]  Name: [name] MRN: [id number] Date of Birth: 01-15-1934

## 2016-05-11 MED ORDER — TRANEXAMIC ACID 1000 MG/10ML IV SOLN
1000.0000 mg | INTRAVENOUS | Status: AC
  Administered 2016-05-12: 1000 mg via INTRAVENOUS
  Filled 2016-05-11: qty 10

## 2016-05-11 MED ORDER — LACTATED RINGERS IV SOLN: INTRAVENOUS | Status: DC

## 2016-05-11 MED ORDER — CEFAZOLIN SODIUM-DEXTROSE 2-4 GM/100ML-% IV SOLN
2.0000 g | INTRAVENOUS | Status: AC
  Administered 2016-05-12: 2 g via INTRAVENOUS
  Filled 2016-05-11: qty 100

## 2016-05-12 ENCOUNTER — Encounter (HOSPITAL_COMMUNITY)
Admission: RE | Disposition: A | Discharge status: Skilled Nursing Facility | Payer: Self-pay | Source: Ambulatory Visit | Attending: Orthopedic Surgery

## 2016-05-12 ENCOUNTER — Inpatient Hospital Stay (HOSPITAL_COMMUNITY): Payer: Medicare Other | Admitting: Vascular Surgery

## 2016-05-12 ENCOUNTER — Encounter (HOSPITAL_COMMUNITY): Payer: Self-pay | Admitting: *Deleted

## 2016-05-12 ENCOUNTER — Inpatient Hospital Stay (HOSPITAL_COMMUNITY): Payer: Medicare Other

## 2016-05-12 ENCOUNTER — Inpatient Hospital Stay (HOSPITAL_COMMUNITY)
Admission: RE | Admit: 2016-05-12 | Discharge: 2016-05-14 | DRG: 470 | Disposition: A | Discharge status: Skilled Nursing Facility | Payer: Medicare Other | Source: Ambulatory Visit | Attending: Orthopedic Surgery | Admitting: Orthopedic Surgery

## 2016-05-12 ENCOUNTER — Inpatient Hospital Stay (HOSPITAL_COMMUNITY): Payer: Medicare Other | Admitting: Anesthesiology

## 2016-05-12 DIAGNOSIS — M24561 Contracture, right knee: Secondary | ICD-10-CM | POA: Diagnosis present

## 2016-05-12 DIAGNOSIS — N189 Chronic kidney disease, unspecified: Secondary | ICD-10-CM | POA: Diagnosis present

## 2016-05-12 DIAGNOSIS — Z888 Allergy status to other drugs, medicaments and biological substances status: Secondary | ICD-10-CM

## 2016-05-12 DIAGNOSIS — E1122 Type 2 diabetes mellitus with diabetic chronic kidney disease: Secondary | ICD-10-CM | POA: Diagnosis present

## 2016-05-12 DIAGNOSIS — M1711 Unilateral primary osteoarthritis, right knee: Secondary | ICD-10-CM | POA: Diagnosis present

## 2016-05-12 DIAGNOSIS — Z86010 Personal history of colonic polyps: Secondary | ICD-10-CM

## 2016-05-12 DIAGNOSIS — Z87891 Personal history of nicotine dependence: Secondary | ICD-10-CM

## 2016-05-12 DIAGNOSIS — D62 Acute posthemorrhagic anemia: Secondary | ICD-10-CM | POA: Diagnosis not present

## 2016-05-12 DIAGNOSIS — Z96652 Presence of left artificial knee joint: Secondary | ICD-10-CM | POA: Diagnosis present

## 2016-05-12 DIAGNOSIS — Z96659 Presence of unspecified artificial knee joint: Secondary | ICD-10-CM

## 2016-05-12 DIAGNOSIS — E1151 Type 2 diabetes mellitus with diabetic peripheral angiopathy without gangrene: Secondary | ICD-10-CM | POA: Diagnosis present

## 2016-05-12 DIAGNOSIS — I129 Hypertensive chronic kidney disease with stage 1 through stage 4 chronic kidney disease, or unspecified chronic kidney disease: Secondary | ICD-10-CM | POA: Diagnosis present

## 2016-05-12 HISTORY — PX: TOTAL KNEE ARTHROPLASTY: SHX125

## 2016-05-12 LAB — GLUCOSE, CAPILLARY
GLUCOSE-CAPILLARY: 95 mg/dL (ref 65–99)
Glucose-Capillary: 118 mg/dL — ABNORMAL HIGH (ref 65–99)
Glucose-Capillary: 121 mg/dL — ABNORMAL HIGH (ref 65–99)

## 2016-05-12 SURGERY — ARTHROPLASTY, KNEE, TOTAL
Anesthesia: Regional | Site: Knee | Laterality: Right

## 2016-05-12 MED ORDER — SODIUM CHLORIDE 0.9 % IR SOLN
Status: DC | PRN
  Administered 2016-05-12: 3000 mL
  Administered 2016-05-12: 1000 mL

## 2016-05-12 MED ORDER — BISACODYL 10 MG RE SUPP: 10.0000 mg | Freq: Every day | RECTAL | Status: DC | PRN

## 2016-05-12 MED ORDER — HYDROMORPHONE HCL 1 MG/ML IJ SOLN
INTRAMUSCULAR | Status: AC
  Filled 2016-05-12: qty 1

## 2016-05-12 MED ORDER — POLYETHYLENE GLYCOL 3350 17 G PO PACK: 17.0000 g | PACK | Freq: Every day | ORAL | Status: DC | PRN

## 2016-05-12 MED ORDER — CELECOXIB 200 MG PO CAPS
200.0000 mg | ORAL_CAPSULE | Freq: Two times a day (BID) | ORAL | Status: DC
  Administered 2016-05-12 – 2016-05-14 (×5): 200 mg via ORAL
  Filled 2016-05-12 (×5): qty 1

## 2016-05-12 MED ORDER — CHLORHEXIDINE GLUCONATE 4 % EX LIQD: 60.0000 mL | Freq: Once | CUTANEOUS | Status: DC

## 2016-05-12 MED ORDER — ONDANSETRON HCL 4 MG/2ML IJ SOLN
INTRAMUSCULAR | Status: AC
  Filled 2016-05-12: qty 2

## 2016-05-12 MED ORDER — MIDAZOLAM HCL 2 MG/2ML IJ SOLN
INTRAMUSCULAR | Status: AC
  Administered 2016-05-12: 1.5 mg via INTRAVENOUS
  Filled 2016-05-12: qty 2

## 2016-05-12 MED ORDER — POTASSIUM CHLORIDE IN NACL 20-0.9 MEQ/L-% IV SOLN
INTRAVENOUS | Status: DC
  Administered 2016-05-12: 100 mL/h via INTRAVENOUS
  Administered 2016-05-13 (×2): via INTRAVENOUS
  Filled 2016-05-12 (×2): qty 1000

## 2016-05-12 MED ORDER — MENTHOL 3 MG MT LOZG: 1.0000 | LOZENGE | OROMUCOSAL | Status: DC | PRN

## 2016-05-12 MED ORDER — LIDOCAINE 2% (20 MG/ML) 5 ML SYRINGE
INTRAMUSCULAR | Status: AC
Start: 2016-05-12 — End: 2016-05-12
  Filled 2016-05-12: qty 5

## 2016-05-12 MED ORDER — LISINOPRIL-HYDROCHLOROTHIAZIDE 20-12.5 MG PO TABS: 1.0000 | ORAL_TABLET | Freq: Every day | ORAL | Status: DC

## 2016-05-12 MED ORDER — METOCLOPRAMIDE HCL 5 MG/ML IJ SOLN: 5.0000 mg | Freq: Three times a day (TID) | INTRAMUSCULAR | Status: DC | PRN

## 2016-05-12 MED ORDER — BUPIVACAINE HCL (PF) 0.5 % IJ SOLN
INTRAMUSCULAR | Status: AC
  Filled 2016-05-12: qty 30

## 2016-05-12 MED ORDER — HYDROMORPHONE HCL 2 MG PO TABS: 2.0000 mg | ORAL_TABLET | ORAL | 0 refills | Status: DC | PRN

## 2016-05-12 MED ORDER — SUGAMMADEX SODIUM 200 MG/2ML IV SOLN
INTRAVENOUS | Status: DC | PRN
  Administered 2016-05-12: 154.2 mg via INTRAVENOUS

## 2016-05-12 MED ORDER — ROCURONIUM BROMIDE 50 MG/5ML IV SOLN
INTRAVENOUS | Status: AC
  Filled 2016-05-12: qty 1

## 2016-05-12 MED ORDER — HYDROMORPHONE HCL 1 MG/ML IJ SOLN
0.2500 mg | INTRAMUSCULAR | Status: DC | PRN
  Administered 2016-05-12 (×2): 0.5 mg via INTRAVENOUS

## 2016-05-12 MED ORDER — ROCURONIUM BROMIDE 100 MG/10ML IV SOLN
INTRAVENOUS | Status: DC | PRN
  Administered 2016-05-12: 40 mg via INTRAVENOUS

## 2016-05-12 MED ORDER — FENTANYL CITRATE (PF) 100 MCG/2ML IJ SOLN
INTRAMUSCULAR | Status: AC
  Administered 2016-05-12: 50 ug via INTRAVENOUS
  Filled 2016-05-12: qty 2

## 2016-05-12 MED ORDER — BUPIVACAINE HCL 0.5 % IJ SOLN
INTRAMUSCULAR | Status: DC | PRN
  Administered 2016-05-12: 10 mL

## 2016-05-12 MED ORDER — DEXAMETHASONE SODIUM PHOSPHATE 10 MG/ML IJ SOLN
10.0000 mg | Freq: Once | INTRAMUSCULAR | Status: AC
  Administered 2016-05-13: 10 mg via INTRAVENOUS
  Filled 2016-05-12: qty 1

## 2016-05-12 MED ORDER — ONDANSETRON HCL 4 MG PO TABS: 4.0000 mg | ORAL_TABLET | Freq: Three times a day (TID) | ORAL | 0 refills | Status: DC | PRN

## 2016-05-12 MED ORDER — LIDOCAINE HCL (CARDIAC) 20 MG/ML IV SOLN
INTRAVENOUS | Status: DC | PRN
  Administered 2016-05-12: 80 mg via INTRAVENOUS

## 2016-05-12 MED ORDER — HYDROCHLOROTHIAZIDE 12.5 MG PO CAPS
12.5000 mg | ORAL_CAPSULE | Freq: Every day | ORAL | Status: DC
  Administered 2016-05-13: 12.5 mg via ORAL
  Filled 2016-05-12 (×2): qty 1

## 2016-05-12 MED ORDER — PROPOFOL 10 MG/ML IV BOLUS
INTRAVENOUS | Status: DC | PRN
  Administered 2016-05-12: 150 mg via INTRAVENOUS

## 2016-05-12 MED ORDER — FENTANYL CITRATE (PF) 100 MCG/2ML IJ SOLN
50.0000 ug | INTRAMUSCULAR | Status: DC | PRN
  Administered 2016-05-12: 50 ug via INTRAVENOUS

## 2016-05-12 MED ORDER — BUPIVACAINE LIPOSOME 1.3 % IJ SUSP
INTRAMUSCULAR | Status: DC | PRN
  Administered 2016-05-12: 20 mL

## 2016-05-12 MED ORDER — PHENOL 1.4 % MT LIQD: 1.0000 | OROMUCOSAL | Status: DC | PRN

## 2016-05-12 MED ORDER — ACETAMINOPHEN 325 MG PO TABS
650.0000 mg | ORAL_TABLET | Freq: Four times a day (QID) | ORAL | Status: DC | PRN
  Administered 2016-05-14: 650 mg via ORAL
  Filled 2016-05-12: qty 2

## 2016-05-12 MED ORDER — MIDAZOLAM HCL 2 MG/2ML IJ SOLN
1.0000 mg | INTRAMUSCULAR | Status: DC | PRN
  Administered 2016-05-12: 1.5 mg via INTRAVENOUS

## 2016-05-12 MED ORDER — SODIUM CHLORIDE 0.9 % IV SOLN: INTRAVENOUS | Status: DC

## 2016-05-12 MED ORDER — ZOLPIDEM TARTRATE 5 MG PO TABS: 5.0000 mg | ORAL_TABLET | Freq: Every evening | ORAL | Status: DC | PRN

## 2016-05-12 MED ORDER — METOCLOPRAMIDE HCL 5 MG PO TABS: 5.0000 mg | ORAL_TABLET | Freq: Three times a day (TID) | ORAL | Status: DC | PRN

## 2016-05-12 MED ORDER — ONDANSETRON HCL 4 MG PO TABS: 4.0000 mg | ORAL_TABLET | Freq: Four times a day (QID) | ORAL | Status: DC | PRN

## 2016-05-12 MED ORDER — DIPHENHYDRAMINE HCL 12.5 MG/5ML PO ELIX: 12.5000 mg | ORAL_SOLUTION | ORAL | Status: DC | PRN

## 2016-05-12 MED ORDER — SENNA 8.6 MG PO TABS
1.0000 | ORAL_TABLET | Freq: Two times a day (BID) | ORAL | Status: DC
  Administered 2016-05-12 – 2016-05-14 (×5): 8.6 mg via ORAL
  Filled 2016-05-12 (×5): qty 1

## 2016-05-12 MED ORDER — ONDANSETRON HCL 4 MG/2ML IJ SOLN: 4.0000 mg | Freq: Four times a day (QID) | INTRAMUSCULAR | Status: DC | PRN

## 2016-05-12 MED ORDER — DIAZEPAM 2 MG PO TABS: 2.0000 mg | ORAL_TABLET | Freq: Three times a day (TID) | ORAL | Status: DC | PRN

## 2016-05-12 MED ORDER — FENTANYL CITRATE (PF) 100 MCG/2ML IJ SOLN
INTRAMUSCULAR | Status: DC | PRN
  Administered 2016-05-12: 50 ug via INTRAVENOUS
  Administered 2016-05-12: 100 ug via INTRAVENOUS

## 2016-05-12 MED ORDER — MAGNESIUM CITRATE PO SOLN: 1.0000 | Freq: Once | ORAL | Status: DC | PRN

## 2016-05-12 MED ORDER — SUGAMMADEX SODIUM 200 MG/2ML IV SOLN
INTRAVENOUS | Status: AC
  Filled 2016-05-12: qty 2

## 2016-05-12 MED ORDER — MIDAZOLAM HCL 2 MG/2ML IJ SOLN
INTRAMUSCULAR | Status: AC
  Filled 2016-05-12: qty 2

## 2016-05-12 MED ORDER — SODIUM CHLORIDE 0.9 % IV SOLN
INTRAVENOUS | Status: DC
  Administered 2016-05-12 (×2): via INTRAVENOUS

## 2016-05-12 MED ORDER — LISINOPRIL 20 MG PO TABS
20.0000 mg | ORAL_TABLET | Freq: Every day | ORAL | Status: DC
  Administered 2016-05-13: 20 mg via ORAL
  Filled 2016-05-12 (×2): qty 1

## 2016-05-12 MED ORDER — FENTANYL CITRATE (PF) 250 MCG/5ML IJ SOLN
INTRAMUSCULAR | Status: AC
  Filled 2016-05-12: qty 5

## 2016-05-12 MED ORDER — ASPIRIN EC 325 MG PO TBEC
325.0000 mg | DELAYED_RELEASE_TABLET | Freq: Every day | ORAL | Status: DC
  Administered 2016-05-13 – 2016-05-14 (×2): 325 mg via ORAL
  Filled 2016-05-12 (×2): qty 1

## 2016-05-12 MED ORDER — ASPIRIN EC 325 MG PO TBEC: 325.0000 mg | DELAYED_RELEASE_TABLET | Freq: Every day | ORAL | 0 refills | Status: DC

## 2016-05-12 MED ORDER — ACETAMINOPHEN 650 MG RE SUPP: 650.0000 mg | Freq: Four times a day (QID) | RECTAL | Status: DC | PRN

## 2016-05-12 MED ORDER — CEFAZOLIN SODIUM-DEXTROSE 2-4 GM/100ML-% IV SOLN
2.0000 g | Freq: Four times a day (QID) | INTRAVENOUS | Status: AC
  Administered 2016-05-12 (×2): 2 g via INTRAVENOUS
  Filled 2016-05-12 (×2): qty 100

## 2016-05-12 MED ORDER — HYDROMORPHONE HCL 2 MG PO TABS
2.0000 mg | ORAL_TABLET | ORAL | Status: DC | PRN
  Administered 2016-05-12: 2 mg via ORAL
  Filled 2016-05-12: qty 1

## 2016-05-12 MED ORDER — BUPIVACAINE LIPOSOME 1.3 % IJ SUSP
20.0000 mL | INTRAMUSCULAR | Status: DC
  Filled 2016-05-12: qty 20

## 2016-05-12 SURGICAL SUPPLY — 73 items
APL SKNCLS STERI-STRIP NONHPOA (GAUZE/BANDAGES/DRESSINGS) ×1
BANDAGE ACE 4X5 VEL STRL LF (GAUZE/BANDAGES/DRESSINGS) ×2 IMPLANT
BANDAGE ELASTIC 4 VELCRO ST LF (GAUZE/BANDAGES/DRESSINGS) ×3 IMPLANT
BANDAGE ELASTIC 6 VELCRO ST LF (GAUZE/BANDAGES/DRESSINGS) ×3 IMPLANT
BANDAGE ESMARK 6X9 LF (GAUZE/BANDAGES/DRESSINGS) ×1 IMPLANT
BENZOIN TINCTURE PRP APPL 2/3 (GAUZE/BANDAGES/DRESSINGS) ×3 IMPLANT
BLADE SAG 18X100X1.27 (BLADE) ×6 IMPLANT
BNDG CMPR 9X6 STRL LF SNTH (GAUZE/BANDAGES/DRESSINGS) ×1
BNDG ESMARK 6X9 LF (GAUZE/BANDAGES/DRESSINGS) ×3
BOWL SMART MIX CTS (DISPOSABLE) ×3 IMPLANT
CAPT KNEE TOTAL 3 ×2 IMPLANT
CEMENT BONE SIMPLEX SPEEDSET (Cement) ×6 IMPLANT
CLOSURE STERI-STRIP 1/2X4 (GAUZE/BANDAGES/DRESSINGS) ×1
CLOSURE WOUND 1/2 X4 (GAUZE/BANDAGES/DRESSINGS) ×2
CLSR STERI-STRIP ANTIMIC 1/2X4 (GAUZE/BANDAGES/DRESSINGS) ×1 IMPLANT
COVER SURGICAL LIGHT HANDLE (MISCELLANEOUS) ×3 IMPLANT
CUFF TOURNIQUET SINGLE 34IN LL (TOURNIQUET CUFF) ×3 IMPLANT
DRAPE EXTREMITY T 121X128X90 (DRAPE) ×3 IMPLANT
DRAPE IMP U-DRAPE 54X76 (DRAPES) ×3 IMPLANT
DRAPE PROXIMA HALF (DRAPES) ×3 IMPLANT
DRAPE U-SHAPE 47X51 STRL (DRAPES) ×3 IMPLANT
DRESSING AQUACEL AG SP 3.5X10 (GAUZE/BANDAGES/DRESSINGS) IMPLANT
DRSG AQUACEL AG SP 3.5X10 (GAUZE/BANDAGES/DRESSINGS) ×3
DRSG PAD ABDOMINAL 8X10 ST (GAUZE/BANDAGES/DRESSINGS) ×3 IMPLANT
DURAPREP 26ML APPLICATOR (WOUND CARE) ×6 IMPLANT
ELECT CAUTERY BLADE 6.4 (BLADE) ×3 IMPLANT
ELECT REM PT RETURN 9FT ADLT (ELECTROSURGICAL) ×3
ELECTRODE REM PT RTRN 9FT ADLT (ELECTROSURGICAL) ×1 IMPLANT
EVACUATOR 1/8 PVC DRAIN (DRAIN) ×3 IMPLANT
FACESHIELD WRAPAROUND (MASK) ×6 IMPLANT
FACESHIELD WRAPAROUND OR TEAM (MASK) ×2 IMPLANT
GAUZE SPONGE 4X4 12PLY STRL (GAUZE/BANDAGES/DRESSINGS) ×3 IMPLANT
GLOVE BIOGEL PI IND STRL 7.0 (GLOVE) ×1 IMPLANT
GLOVE BIOGEL PI INDICATOR 7.0 (GLOVE) ×2
GLOVE ORTHO TXT STRL SZ7.5 (GLOVE) ×3 IMPLANT
GLOVE SURG ORTHO 7.0 STRL STRW (GLOVE) ×3 IMPLANT
GOWN STRL REUS W/ TWL LRG LVL3 (GOWN DISPOSABLE) ×2 IMPLANT
GOWN STRL REUS W/ TWL XL LVL3 (GOWN DISPOSABLE) ×1 IMPLANT
GOWN STRL REUS W/TWL LRG LVL3 (GOWN DISPOSABLE) ×6
GOWN STRL REUS W/TWL XL LVL3 (GOWN DISPOSABLE) ×3
HANDPIECE INTERPULSE COAX TIP (DISPOSABLE) ×3
IMMOBILIZER KNEE 22 UNIV (SOFTGOODS) ×3 IMPLANT
IMMOBILIZER KNEE 24 THIGH 36 (MISCELLANEOUS) IMPLANT
IMMOBILIZER KNEE 24 UNIV (MISCELLANEOUS)
KIT BASIN OR (CUSTOM PROCEDURE TRAY) ×3 IMPLANT
KIT ROOM TURNOVER OR (KITS) ×3 IMPLANT
MANIFOLD NEPTUNE II (INSTRUMENTS) ×3 IMPLANT
NDL 18GX1X1/2 (RX/OR ONLY) (NEEDLE) ×1 IMPLANT
NDL HYPO 25GX1X1/2 BEV (NEEDLE) ×1 IMPLANT
NEEDLE 18GX1X1/2 (RX/OR ONLY) (NEEDLE) ×3 IMPLANT
NEEDLE HYPO 25GX1X1/2 BEV (NEEDLE) ×3 IMPLANT
NS IRRIG 1000ML POUR BTL (IV SOLUTION) ×3 IMPLANT
PACK TOTAL JOINT (CUSTOM PROCEDURE TRAY) ×3 IMPLANT
PACK UNIVERSAL I (CUSTOM PROCEDURE TRAY) ×3 IMPLANT
PAD ARMBOARD 7.5X6 YLW CONV (MISCELLANEOUS) ×6 IMPLANT
PAD CAST 4YDX4 CTTN HI CHSV (CAST SUPPLIES) ×1 IMPLANT
PADDING CAST COTTON 4X4 STRL (CAST SUPPLIES) ×3
SET HNDPC FAN SPRY TIP SCT (DISPOSABLE) ×1 IMPLANT
STRIP CLOSURE SKIN 1/2X4 (GAUZE/BANDAGES/DRESSINGS) ×4 IMPLANT
SUCTION FRAZIER HANDLE 10FR (MISCELLANEOUS) ×2
SUCTION TUBE FRAZIER 10FR DISP (MISCELLANEOUS) ×1 IMPLANT
SUT MNCRL AB 4-0 PS2 18 (SUTURE) ×3 IMPLANT
SUT VIC AB 0 CT1 27 (SUTURE)
SUT VIC AB 0 CT1 27XBRD ANBCTR (SUTURE) IMPLANT
SUT VIC AB 1 CTX 36 (SUTURE) ×3
SUT VIC AB 1 CTX36XBRD ANBCTR (SUTURE) ×1 IMPLANT
SUT VIC AB 2-0 CT1 27 (SUTURE) ×9
SUT VIC AB 2-0 CT1 TAPERPNT 27 (SUTURE) ×3 IMPLANT
SYR 50ML LL SCALE MARK (SYRINGE) ×3 IMPLANT
SYR CONTROL 10ML LL (SYRINGE) ×3 IMPLANT
TOWEL OR 17X24 6PK STRL BLUE (TOWEL DISPOSABLE) ×3 IMPLANT
TOWEL OR 17X26 10 PK STRL BLUE (TOWEL DISPOSABLE) ×3 IMPLANT
WATER STERILE IRR 1000ML POUR (IV SOLUTION) ×3 IMPLANT

## 2016-05-12 NOTE — Progress Notes (Signed)
Orthopedic Tech Progress Note Patient Details:  [name] 1934-08-06 [id number]  CPM Right Knee CPM Right Knee: On Right Knee Flexion (Degrees): 90 Right Knee Extension (Degrees): 0 Additional Comments: trapeze bar patient helper   [name] 05/12/2016, 6:53 PM

## 2016-05-12 NOTE — H&P (View-Only) (Signed)
TOTAL KNEE ADMISSION H&P  Patient is being admitted for right total knee arthroplasty.  Subjective:  Chief Complaint:right knee pain.  HPI: [name], 82 y.o. female, has a history of pain and functional disability in the right knee due to arthritis and has failed non-surgical conservative treatments for greater than 12 weeks to includeNSAID's and/or analgesics, corticosteriod injections and use of assistive devices.  Onset of symptoms was gradual, starting 4 years ago with rapidlly worsening course since that time. The patient noted no past surgery on the right knee(s).  Patient currently rates pain in the right knee(s) at 7 out of 10 with activity. Patient has night pain, worsening of pain with activity and weight bearing and pain that interferes with activities of daily living.  Patient has evidence of subchondral sclerosis and joint space narrowing by imaging studies. There is no active infection.  Patient Active Problem List   Diagnosis Date Noted  . Loss of weight 03/18/2016  . Urinary frequency 03/18/2016  . Annual physical exam 11/25/2015  . CKD (chronic kidney disease) stage 3, GFR 30-59 ml/min 01/15/2015  . Osteoarthritis of right knee 05/04/2014  . At high risk for falls 05/04/2014  . PVD (peripheral vascular disease) ([place]) 06/08/2011  . ABNORMAL ELECTROCARDIOGRAM 09/19/2009  . Type 2 diabetes, diet controlled ([place]) 12/04/2007  . Essential hypertension 10/14/2006  . OSTEOARTHRITIS 10/14/2006  . OSTEOPENIA 10/14/2006  . COLONIC POLYPS, HX OF 10/14/2006   Past Medical History  Diagnosis Date  . Osteopenia   . Arthritis of knee     bilateral    . Diabetes mellitus, type 2 ([place])   . Hypertension   . Personal history of colonic polyps 3 years ago     colonoscopy   . Chronic renal insufficiency   . PAD (peripheral artery disease) [facility])     Past Surgical History  Procedure Laterality Date  . Total abdominal hysterectomy      bleeding   . Left knee replacement   08/14/08    Dr. Aline Brochure   . Left knee manipulation  under anasthesia due to contracture -  03/08/08    Dr. Aline Brochure   . Bunionectomy bilateral  feet    . Wisdom tooth extraction    . Dental surgery    . Gum disease  2010    treated by Dr. Andree Elk      (Not in a hospital admission) Allergies  Allergen Reactions  . Fosamax [Alendronate Sodium] Other (See Comments)    Pt reports excessive urination on the day she took the pill    Social History  Substance Use Topics  . Smoking status: Former Research scientist (life sciences)  . Smokeless tobacco: Not on file     Comment: smoked for 15 years-not smoked since 1998  . Alcohol Use: No    Family History  Problem Relation Age of Onset  . Colon cancer Father   . Cancer Mother   . Stroke Mother   . Hypertension Brother      Review of Systems  Constitutional: Negative.   HENT: Negative.   Eyes: Negative.   Respiratory: Negative.   Cardiovascular: Negative.   Gastrointestinal: Negative.   Genitourinary: Negative.   Musculoskeletal: Positive for joint pain.  Skin: Negative.   Neurological: Negative.   Endo/Heme/Allergies: Negative.   Psychiatric/Behavioral: Negative.     Objective:  Physical Exam  Constitutional: She is oriented to person, place, and time. She appears well-developed and well-nourished.  HENT:  Head: Normocephalic and atraumatic.  Eyes: EOM are normal. Pupils  are equal, round, and reactive to light.  Neck: Normal range of motion. Neck supple.  Cardiovascular: Normal rate and regular rhythm.   Respiratory: Effort normal and breath sounds normal.  GI: Soft. Bowel sounds are normal.  Musculoskeletal:  Examination of her right knee reveals range of motion from 0-120 degrees.  No joint line tenderness.  Moderate patellofemoral crepitus.  She is neurovascularly intact distally.    Neurological: She is alert and oriented to person, place, and time.  Skin: Skin is warm and dry.  Psychiatric: She has a normal mood and affect. Her behavior  is normal. Judgment and thought content normal.    Vital signs in last 24 hours: @VSRANGES @  Labs:   Estimated body mass index is 28.45 kg/(m^2) as calculated from the following:   Height as of 04/14/16: 5' 6.5" (1.689 m).   Weight as of 04/14/16: 81.149 kg (178 lb 14.4 oz).   Imaging Review Plain radiographs demonstrate severe degenerative joint disease of the right knee(s). The overall alignment ismild varus. The bone quality appears to be fair for age and reported activity level.  Assessment/Plan:  End stage arthritis, right knee   The patient history, physical examination, clinical judgment of the provider and imaging studies are consistent with end stage degenerative joint disease of the right knee(s) and total knee arthroplasty is deemed medically necessary. The treatment options including medical management, injection therapy arthroscopy and arthroplasty were discussed at length. The risks and benefits of total knee arthroplasty were presented and reviewed. The risks due to aseptic loosening, infection, stiffness, patella tracking problems, thromboembolic complications and other imponderables were discussed. The patient acknowledged the explanation, agreed to proceed with the plan and consent was signed. Patient is being admitted for inpatient treatment for surgery, pain control, PT, OT, prophylactic antibiotics, VTE prophylaxis, progressive ambulation and ADL's and discharge planning. The patient is planning to be discharged home with home health services

## 2016-05-12 NOTE — Interval H&P Note (Signed)
History and Physical Interval Note:  05/12/2016 8:32 AM  [name]  has presented today for surgery, with the diagnosis of djd right knee  The various methods of treatment have been discussed with the patient and family. After consideration of risks, benefits and other options for treatment, the patient has consented to  Procedure(s): TOTAL KNEE ARTHROPLASTY (Right) as a surgical intervention .  The patient's history has been reviewed, patient examined, no change in status, stable for surgery.  I have reviewed the patient's chart and labs.  Questions were answered to the patient's satisfaction.     [name]

## 2016-05-12 NOTE — Addendum Note (Signed)
Addendum  created 05/12/16 1440 by Finis Bud, MD   Anesthesia Attestations filed, Anesthesia Intra Blocks edited, Child order released for a procedure order, Sign clinical note

## 2016-05-12 NOTE — Anesthesia Procedure Notes (Addendum)
Anesthesia Regional Block:  Adductor canal block  Pre-Anesthetic Checklist: ,, timeout performed, Correct Patient, Correct Site, Correct Laterality, Correct Procedure, Correct Position, site marked, Risks and benefits discussed,  Surgical consent,  Pre-op evaluation,  At surgeon's request and post-op pain management  Laterality: Right  Prep: chloraprep       Needles:   Needle Type: Stimulator Needle - 80          Additional Needles:  Procedures: Doppler guided, ultrasound guided (picture in chart) and nerve stimulator Adductor canal block  Nerve Stimulator or Paresthesia:  Response: 0.5 mA,   Additional Responses:   Narrative:  Start time: 05/12/2016 10:15 AM End time: 05/12/2016 10:30 AM Injection made incrementally with aspirations every 5 mL.  Events: injection resistance  Performed by: Personally  Anesthesiologist: EDWARDS, Adalia Pettis

## 2016-05-12 NOTE — Progress Notes (Signed)
Orthopedic Tech Progress Note Patient Details:  [name] 06-03-1934 [id number]  CPM Right Knee CPM Right Knee: On Right Knee Flexion (Degrees): 90 Right Knee Extension (Degrees): 0 Additional Comments: trapeze bar patient helper  Viewed order from doctor's order list Hildred Priest 05/12/2016, 1:48 PM

## 2016-05-12 NOTE — Transfer of Care (Signed)
Immediate Anesthesia Transfer of Care Note  Patient: [name]  Procedure(s) Performed: Procedure(s): TOTAL KNEE ARTHROPLASTY (Right)  Patient Location: PACU  Anesthesia Type:General  Level of Consciousness: awake, alert  and patient cooperative  Airway & Oxygen Therapy: Patient Spontanous Breathing and Patient connected to nasal cannula oxygen  Post-op Assessment: Post -op Vital signs reviewed and stable and Post -op Vital signs reviewed and unstable, Anesthesiologist notified  Post vital signs: Reviewed and stable  Last Vitals:  Vitals:   05/12/16 1030 05/12/16 1246  BP: (!) 141/79 118/67  Pulse: (!) 112 77  Resp: 19 17  Temp:  36.4 C    Last Pain:  Vitals:   05/12/16 0936  TempSrc:   PainSc: 5       Patients Stated Pain Goal: 5 ([id number])  Complications: No apparent anesthesia complications

## 2016-05-12 NOTE — Discharge Instructions (Signed)

## 2016-05-12 NOTE — Progress Notes (Signed)
Orthopedic Tech Progress Note Patient Details:  ABBIEGAIL HAMPEL [birth year] [id number]  Patient ID: [name], female   DOB: 1933/12/16, 82 y.o.   MRN: [id number]   [name] 05/12/2016, 1:51 PM Delete one trapeze bar patient helper charge

## 2016-05-12 NOTE — Anesthesia Postprocedure Evaluation (Signed)
Anesthesia Post Note  Patient: [name]  Procedure(s) Performed: Procedure(s) (LRB): TOTAL KNEE ARTHROPLASTY (Right)  Patient location during evaluation: PACU Anesthesia Type: General Level of consciousness: awake Pain management: pain level controlled Vital Signs Assessment: post-procedure vital signs reviewed and stable Respiratory status: spontaneous breathing Cardiovascular status: stable Anesthetic complications: no    Last Vitals:  Vitals:   05/12/16 1315 05/12/16 1317  BP:  122/87  Pulse: 80 81  Resp: 18 18  Temp:      Last Pain:  Vitals:   05/12/16 1246  TempSrc:   PainSc: 0-No pain                 EDWARDS,Sten Dematteo

## 2016-05-12 NOTE — Anesthesia Preprocedure Evaluation (Addendum)
Anesthesia Evaluation  Patient identified by MRN, date of birth, ID band Patient awake    Reviewed: Allergy & Precautions, NPO status   Airway Mallampati: II       Dental   Pulmonary neg pulmonary ROS, former smoker,    breath sounds clear to auscultation       Cardiovascular hypertension, + Peripheral Vascular Disease   Rhythm:Regular Rate:Normal     Neuro/Psych    GI/Hepatic negative GI ROS, Neg liver ROS,   Endo/Other  diabetes  Renal/GU Renal disease     Musculoskeletal   Abdominal   Peds  Hematology   Anesthesia Other Findings   Reproductive/Obstetrics                             Anesthesia Physical Anesthesia Plan  ASA: III  Anesthesia Plan: General and Regional   Post-op Pain Management: GA combined w/ Regional for post-op pain   Induction: Intravenous  Airway Management Planned: Oral ETT  Additional Equipment:   Intra-op Plan:   Post-operative Plan:   Informed Consent: I have reviewed the patients History and Physical, chart, labs and discussed the procedure including the risks, benefits and alternatives for the proposed anesthesia with the patient or authorized representative who has indicated his/her understanding and acceptance.   Dental advisory given  Plan Discussed with: CRNA and Anesthesiologist  Anesthesia Plan Comments:        Anesthesia Quick Evaluation

## 2016-05-12 NOTE — Op Note (Signed)
NAMEDAFNE, TORRE NO.:  [id number]  MEDICAL RECORD NO.:  [id number]  LOCATION:  5N23C                        FACILITY:  [facility]  PHYSICIAN:  [name], M.D. DATE OF BIRTH:  01-09-1934  DATE OF PROCEDURE:  05/12/2016 DATE OF DISCHARGE:                              OPERATIVE REPORT   PREOPERATIVE DIAGNOSIS:  Right knee end-stage arthritis, primary generalized.  POSTOPERATIVE DIAGNOSIS:  Right knee end-stage arthritis, primary generalized with 5-degree flexion contracture, moderate underlying osteopenia.  PROCEDURE:  Right knee modified minimally invasive total knee replacement with Stryker triathlon prosthesis.  Cemented pegged cruciate retaining #5 femoral component.  Cemented #5 tibial component, 9 mm CS insert.  Cemented resurfacing 35 mm patellar component.  SURGEON:  [name], M.D.  ASSISTANT:  Elmyra Ricks, P.A., who present throughout the entire case and necessary for timely completion of procedure.  ANESTHESIA:  General.  BLOOD LOSS:  Minimal.  SPECIMENS:  None.  CULTURES:  None.  COMPLICATION:  None.  DRESSINGS:  Soft compressive knee immobilizer.  TOURNIQUET TIME:  50 minutes.  DESCRIPTION OF PROCEDURE:  The patient was brought to the operating room and after adequate general anesthesia had been obtained, tourniquet applied.  Prepped and draped in usual sterile fashion.  Exsanguinated with elevation of [place], tourniquet inflated to 350 mmHg.  Straight incision above the patella down to tibial tubercle.  Medial arthrotomy, vastus splitting.  Distal femur exposed.  Flexible intramedullary guide. 10 mm resection, 6 degrees of valgus.  Using epicondylar axis, the femur was sized, cut, and fitted for a pegged cruciate retaining #5 component. Using extramedullary guide, proximal tibial resection, 5-degree posterior slope cut.  Sized to #5 component as well.  Patella exposed, posterior 10 mm removed.  Drilled, sized,  and fitted for a 35 mm component.  With a 9 mm CS insert, full extension, nicely balanced knee, good patellar tracking, excellent by mechanical axis.  Tibia was marked for rotation and hand reamed.  All trials removed.  Copious irrigation with a pulse irrigating device.  Cement prepared, placed on all components, firmly seated.  Polyethylene attached to tibia, knee reduced.  Patella held with a clamp.  Once cement hardened, the knee was irrigated again.  Soft tissues injected with external. Arthrotomy closed with #1 Vicryl.  Subcutaneous and subcuticular closure.  Margins were injected with Marcaine.  Sterile compressive dressing applied.  Tourniquet deflated and removed.  Knee immobilizer applied.  Anesthesia reversed.  Brought to the recovery room.  Tolerated the surgery well.  No complications.     [name], M.D.     DFM/MEDQ  D:  05/12/2016  T:  05/12/2016  Job:  [id number]

## 2016-05-12 NOTE — Discharge Summary (Addendum)
Patient ID: [name] MRN: [id number] DOB/AGE: 1934/04/29 82 y.o.  Admit date: 05/12/2016 Discharge date: 05/14/2016  Admission Diagnoses:  Active Problems:   Primary localized osteoarthritis of right Farmer   Discharge Diagnoses:  Same  Past Medical History:  Diagnosis Date  . Arthritis of Farmer    bilateral    . Chronic renal insufficiency   . Diabetes mellitus, type 2 ([place])   . Hypertension   . Osteopenia   . PAD (peripheral artery disease) ([place])   . Personal history of colonic polyps 3 years ago    colonoscopy     Surgeries: Procedure(s): TOTAL Farmer ARTHROPLASTY on 05/12/2016   Consultants:   Discharged Condition: Improved  Hospital Course: [name] is an 82 y.o. female who was admitted 05/12/2016 for operative treatment of primary localized osteoarthritis right Farmer. Patient has severe unremitting pain that affects sleep, daily activities, and work/hobbies. After pre-op clearance the patient was taken to the operating room on 05/12/2016 and underwent  Procedure(s): TOTAL Farmer ARTHROPLASTY.  Patient with a Hb of 11.5 developed ABLA on pod #1 with a Hb of 8.4.  She is currently stable but we will continue to follow.  Patient was given perioperative antibiotics:  Anti-infectives    Start     Dose/Rate Route Frequency Ordered Stop   05/12/16 1700  ceFAZolin (ANCEF) IVPB 2g/100 mL premix     2 g 200 mL/hr over 30 Minutes Intravenous Every 6 hours 05/12/16 1447 05/12/16 2309   05/12/16 1030  ceFAZolin (ANCEF) IVPB 2g/100 mL premix     2 g 200 mL/hr over 30 Minutes Intravenous To ShortStay Surgical 05/11/16 1041 05/12/16 1130       Patient was given sequential compression devices, early ambulation, and chemoprophylaxis to prevent DVT.  Patient benefited maximally from hospital stay and there were no complications.    Recent vital signs:  Patient Vitals for the past 24 hrs:  BP Temp Temp src Pulse Resp SpO2  05/14/16 0516 129/75 98.4 F (36.9 C) Oral - -  94 %  05/13/16 2046 128/61 98.8 F (37.1 C) Oral 81 - 97 %  05/13/16 1300 127/78 98.8 F (37.1 C) Oral 80 16 92 %     Recent laboratory studies:   Recent Labs  05/13/16 0510 05/14/16 0403  WBC 7.4  --   HGB 8.4*  --   HCT 26.4*  --   PLT 174  --   NA 139 142  K 4.5 4.9  CL 109 112*  CO2 25 25  BUN 25* 26*  CREATININE 1.41* 1.38*  GLUCOSE 102* 112*  CALCIUM 8.3* 8.6*     Discharge Medications:     Medication List    STOP taking these medications   acetaminophen 325 MG tablet Commonly known as:  TYLENOL     TAKE these medications   aspirin EC 325 MG tablet Take 1 tablet (325 mg total) by mouth daily. What changed:  medication strength  how much to take   CALTRATE 600+D 600-400 MG-UNIT tablet Generic drug:  Calcium Carbonate-Vitamin D Take 1 tablet by mouth 2 (two) times daily.   diphenhydrAMINE 50 MG tablet Commonly known as:  BENADRYL Take 50 mg by mouth at bedtime as needed for itching or allergies.   HYDROmorphone 2 MG tablet Commonly known as:  DILAUDID Take 1 tablet (2 mg total) by mouth every 4 (four) hours as needed for severe pain.   lisinopril-hydrochlorothiazide 20-12.5 MG tablet Commonly known as:  PRINZIDE,ZESTORETIC TAKE ONE TABLET  BY MOUTH ONCE DAILY   multivitamin per tablet Take 1 tablet by mouth daily.   ondansetron 4 MG tablet Commonly known as:  ZOFRAN Take 1 tablet (4 mg total) by mouth every 8 (eight) hours as needed for nausea or vomiting.       Diagnostic Studies: Dg Farmer Right Port  Result Date: 05/12/2016 CLINICAL DATA:  Status post total right Farmer arthroplasty. EXAM: PORTABLE RIGHT Farmer - 1-2 VIEW COMPARISON:  01/02/2007 FINDINGS: New right Farmer prosthetic components are well-seated and well-aligned. There is no acute fracture or evidence of an operative complication. IMPRESSION: Well-positioned total right Farmer arthroplasty. Electronically Signed   By: Lajean Manes M.D.   On: 05/12/2016 13:20   Disposition:      Follow-up Information    Diana Lights, MD. Schedule an appointment as soon as possible for a visit in 2 week(s).   Specialty:  Orthopedic Surgery Contact information: [address] [place] [address] [phone]            Signed: Fannie Farmer 05/14/2016, 7:52 AM

## 2016-05-12 NOTE — Anesthesia Procedure Notes (Signed)
Procedure Name: Intubation Date/Time: 05/12/2016 11:19 AM Performed by: Williemae Area B Pre-anesthesia Checklist: Patient identified, Emergency Drugs available, Suction available and Patient being monitored Patient Re-evaluated:Patient Re-evaluated prior to inductionOxygen Delivery Method: Circle system utilized Preoxygenation: Pre-oxygenation with 100% oxygen Intubation Type: IV induction Ventilation: Mask ventilation without difficulty Laryngoscope Size: Mac and 3 Grade View: Grade I Tube type: Oral Tube size: 7.5 mm Number of attempts: 1 Airway Equipment and Method: Stylet Placement Confirmation: ETT inserted through vocal cords under direct vision,  positive ETCO2 and breath sounds checked- equal and bilateral Secured at: 21 (cm at teeth) cm Tube secured with: Tape Dental Injury: Teeth and Oropharynx as per pre-operative assessment

## 2016-05-13 ENCOUNTER — Encounter (HOSPITAL_COMMUNITY): Payer: Self-pay | Admitting: Orthopedic Surgery

## 2016-05-13 LAB — BASIC METABOLIC PANEL
ANION GAP: 5 (ref 5–15)
BUN: 25 mg/dL — ABNORMAL HIGH (ref 6–20)
CHLORIDE: 109 mmol/L (ref 101–111)
CO2: 25 mmol/L (ref 22–32)
Calcium: 8.3 mg/dL — ABNORMAL LOW (ref 8.9–10.3)
Creatinine, Ser: 1.41 mg/dL — ABNORMAL HIGH (ref 0.44–1.00)
GFR calc non Af Amer: 34 mL/min — ABNORMAL LOW (ref >60)
GFR, EST AFRICAN AMERICAN: 40 mL/min — AB (ref >60)
Glucose, Bld: 102 mg/dL — ABNORMAL HIGH (ref 65–99)
POTASSIUM: 4.5 mmol/L (ref 3.5–5.1)
Sodium: 139 mmol/L (ref 135–145)

## 2016-05-13 LAB — CBC
HEMATOCRIT: 26.4 % — AB (ref 36.0–46.0)
HEMOGLOBIN: 8.4 g/dL — AB (ref 12.0–15.0)
MCH: 30.3 pg (ref 26.0–34.0)
MCHC: 31.8 g/dL (ref 30.0–36.0)
MCV: 95.3 fL (ref 78.0–100.0)
Platelets: 174 10*3/uL (ref 150–400)
RBC: 2.77 MIL/uL — AB (ref 3.87–5.11)
RDW: 15.3 % (ref 11.5–15.5)
WBC: 7.4 10*3/uL (ref 4.0–10.5)

## 2016-05-13 NOTE — Evaluation (Addendum)
Physical Therapy Evaluation Patient Details Name: [name] MRN: [id number] DOB: August 30, 1934 Today's Date: 05/13/2016   History of Present Illness  Pt is a 82 y/o female s/p R TKR. PMH including but not limited to CKD, PVD, DM, HTN, and L TKR in 2009.  Clinical Impression  Pt presented supine in bed with HOB elevated and R LE in CPM (extension set to 0 degrees and flexion set to 60 degrees). Pt was awake and willing to participate in therapy session. Pt reported that she needed to void. PT and pt attempted to get pt to bedside commode; however, after multiple attempts, pt was unable to transfer into standing from an elevated bed. Pt was then transferred back into supine and was able to use the bed pan. Once pt was supine in bed and cleaned up after using bed pan, pt was fatigued and could not participate in any additional therapeutic exercises at that time.   Pt reported that prior to admission, she was independent with all ADL's, driving and using a RW to ambulate inside her home and within the community. Pt would continue to benefit from skilled physical therapy services at this time while admitted and after d/c to address her below listed limitations in order to improve her overall safety and independence with functional mobility.      Follow Up Recommendations SNF    Equipment Recommendations  None recommended by PT    Recommendations for Other Services       Precautions / Restrictions Precautions Precautions: Fall;Knee Precaution Comments: PT reviewed precautions and positioning following TKR surgery with pt. Restrictions Weight Bearing Restrictions: Yes RLE Weight Bearing: Weight bearing as tolerated      Mobility  Bed Mobility Overal bed mobility: Needs Assistance Bed Mobility: Supine to Sit;Sit to Supine;Rolling Rolling: Mod assist   Supine to sit: Min assist;HOB elevated Sit to supine: Min assist   General bed mobility comments: utilized pad underneath of pt to  assist with rotating hips to better align pelvis in sitting EOB. Pt required mod A at hips during rolling for positioning of bed pan and for hygeine afterwards.  Transfers                 General transfer comment: Pt attempted STS and SPT multiple times; however, was unable to complete. After session, pt's nurse stated that the nursing team was able to transfer pt to bedside commode as a total dependent lift. PT encouraged use of lift equipment for transfers with pt.   Ambulation/Gait                Stairs            Wheelchair Mobility    Modified Rankin (Stroke Patients Only)       Balance Overall balance assessment: Needs assistance Sitting-balance support: Bilateral upper extremity supported;Feet supported Sitting balance-Leahy Scale: Poor                                       Pertinent Vitals/Pain Pain Assessment: 0-10 Pain Score: 5  Pain Location: R knee Pain Descriptors / Indicators: Discomfort;Operative site guarding Pain Intervention(s): Limited activity within patient's tolerance;Monitored during session;Repositioned    Home Living Family/patient expects to be discharged to:: Skilled nursing facility ([facility] per pt's report)                      Prior  Function Level of Independence: Independent with assistive device(s) (RW to ambulate)         Comments: Pt stated that she still drives and performs all ADL's independently. Pt stated uses RW to ambulate.     Hand Dominance        Extremity/Trunk Assessment   Upper Extremity Assessment: Defer to OT evaluation           Lower Extremity Assessment: RLE deficits/detail RLE Deficits / Details: Pt with decreased strength and ROM limitations secondary to post-op. Sensation grossly intact.       Communication   Communication: No difficulties  Cognition Arousal/Alertness: Awake/alert Behavior During Therapy: WFL for tasks assessed/performed Overall  Cognitive Status: No family/caregiver present to determine baseline cognitive functioning                      General Comments      Exercises Total Joint Exercises Knee Flexion: AROM;Right;5 reps;Seated;Limitations (secondary to pain)      Assessment/Plan    PT Assessment Patient needs continued PT services  PT Diagnosis Difficulty walking   PT Problem List Decreased strength;Decreased range of motion;Decreased activity tolerance;Decreased balance;Decreased mobility;Decreased coordination;Pain  PT Treatment Interventions DME instruction;Gait training;Stair training;Functional mobility training;Therapeutic activities;Therapeutic exercise;Balance training;Neuromuscular re-education;Patient/family education   PT Goals (Current goals can be found in the Care Plan section) Acute Rehab PT Goals Patient Stated Goal: return home following rehab PT Goal Formulation: With patient Time For Goal Achievement: 05/20/16 Potential to Achieve Goals: Fair    Frequency 7X/week   Barriers to discharge        Co-evaluation               End of Session Equipment Utilized During Treatment: Gait belt Activity Tolerance: Patient limited by fatigue;Patient limited by pain Patient left: in bed;with call bell/phone within reach Nurse Communication: Mobility status;Need for lift equipment         Time: [phone] PT Time Calculation (min) (ACUTE ONLY): 32 min   Charges:   PT Evaluation $PT Eval Moderate Complexity: 1 Procedure PT Treatments $Therapeutic Activity: 8-22 mins   PT G CodesClearnce Sorrel Mykeria Garman 05/13/2016, 11:35 AM Sherie Don, PT, DPT [phone]

## 2016-05-13 NOTE — Progress Notes (Signed)
Subjective: 1 Day Post-Op Procedure(s) (LRB): TOTAL KNEE ARTHROPLASTY (Right) Patient reports pain as mild.  No nausea/vomiting, lightheadedness/dizziness, chest pain/sob.  Positive flatus, no bm.  Tolerating diet.  Objective: Vital signs in last 24 hours: Temp:  [97.6 F (36.4 C)-99.1 F (37.3 C)] 98.7 F (37.1 C) (07/27 0149) Pulse Rate:  [53-112] 69 (07/27 0149) Resp:  [14-22] 18 (07/26 2210) BP: (93-149)/(39-98) 98/58 (07/27 0652) SpO2:  [89 %-100 %] 94 % (07/27 0149) Weight:  [77.1 kg (170 lb)] 77.1 kg (170 lb) (07/26 0936)  Intake/Output from previous day: 07/26 0701 - 07/27 0700 In: 3110 [P.O.:360; I.V.:2650; IV Piggyback:100] Out: 20 [Blood:20] Intake/Output this shift: Total I/O In: 1270 [P.O.:120; I.V.:1150] Out: -    Recent Labs  05/13/16 0510  HGB 8.4*    Recent Labs  05/13/16 0510  WBC 7.4  RBC 2.77*  HCT 26.4*  PLT 174    Recent Labs  05/13/16 0510  NA 139  K 4.5  CL 109  CO2 25  BUN 25*  CREATININE 1.41*  GLUCOSE 102*  CALCIUM 8.3*   No results for input(s): LABPT, INR in the last 72 hours.  Neurologically intact Neurovascular intact Sensation intact distally Intact pulses distally Dorsiflexion/Plantar flexion intact Incision: dressing C/D/I No cellulitis present Compartment soft  Assessment/Plan: 1 Day Post-Op Procedure(s) (LRB): TOTAL KNEE ARTHROPLASTY (Right) Advance diet Up with therapy Discharge to SNF once approved by insurance (likely tomorrow to [facility]) WBAT RLE ABLA-mild and stable Dry dressing change prn  [name] 05/13/2016, 7:00 AM

## 2016-05-13 NOTE — NC FL2 (Signed)
[place] MEDICAID FL2 LEVEL OF CARE SCREENING TOOL     IDENTIFICATION  Patient Name: [name] Birthdate: [birth year] Sex: female Admission Date (Current Location): 05/12/2016  [facility] and [place] Number:  Herbalist and Address:  [facility] [place], [place] [address] [place], [place] [address]      Provider Number: [id number]  Attending Physician Name and Address:  [name], MD  Relative Name and Phone Number:       Current Level of Care: Hospital Recommended Level of Care: [place] Prior Approval Number:    Date Approved/Denied:   PASRR Number: [id number]  Discharge Plan: SNF    Current Diagnoses: Patient Active Problem List   Diagnosis Date Noted  . Primary localized osteoarthritis of right knee 05/12/2016  . Loss of weight 03/18/2016  . Urinary frequency 03/18/2016  . Annual physical exam 11/25/2015  . CKD (chronic kidney disease) stage 3, GFR 30-59 ml/min 01/15/2015  . Osteoarthritis of right knee 05/04/2014  . At high risk for falls 05/04/2014  . PVD (peripheral vascular disease) ([place]) 06/08/2011  . ABNORMAL ELECTROCARDIOGRAM 09/19/2009  . Type 2 diabetes, diet controlled ([place]) 12/04/2007  . Essential hypertension 10/14/2006  . OSTEOARTHRITIS 10/14/2006  . OSTEOPENIA 10/14/2006  . COLONIC POLYPS, HX OF 10/14/2006    Orientation RESPIRATION BLADDER Height & Weight     Self, Time, Situation, Place  O2 Continent Weight: 170 lb (77.1 kg) Height:  5' 6.5" (168.9 cm)  BEHAVIORAL SYMPTOMS/MOOD NEUROLOGICAL BOWEL NUTRITION STATUS      Continent Diet (Please see discharge summary.)  AMBULATORY STATUS COMMUNICATION OF NEEDS Skin   Extensive Assist Verbally Surgical wounds                       Personal Care Assistance Level of Assistance  Bathing, Feeding, Dressing Bathing Assistance: Limited assistance Feeding assistance: Independent Dressing Assistance: Limited assistance     Functional  Limitations Info             SPECIAL CARE FACTORS FREQUENCY  PT (By licensed PT), OT (By licensed OT)     PT Frequency: 5 OT Frequency: 5            Contractures      Additional Factors Info  Code Status, Allergies Code Status Info: FULL Allergies Info: Codeine, Fosamax Alendronate Sodium           Current Medications (05/13/2016):  This is the current hospital active medication list Current Facility-Administered Medications  Medication Dose Route Frequency Provider Last Rate Last Dose  . 0.9 % NaCl with KCl 20 mEq/ L  infusion   Intravenous Continuous Aundra Dubin, PA-C 50 mL/hr at 05/13/16 [id number]    . acetaminophen (TYLENOL) tablet 650 mg  650 mg Oral Q6H PRN Aundra Dubin, PA-C       Or  . acetaminophen (TYLENOL) suppository 650 mg  650 mg Rectal Q6H PRN Aundra Dubin, PA-C      . aspirin EC tablet 325 mg  325 mg Oral Q breakfast Aundra Dubin, PA-C   325 mg at 05/13/16 0850  . bisacodyl (DULCOLAX) suppository 10 mg  10 mg Rectal Daily PRN Aundra Dubin, PA-C      . celecoxib (CELEBREX) capsule 200 mg  200 mg Oral Q12H Aundra Dubin, PA-C   200 mg at 05/13/16 0850  . diazepam (VALIUM) tablet 2 mg  2 mg Oral Q8H PRN Aundra Dubin, PA-C      .  diphenhydrAMINE (BENADRYL) 12.5 MG/5ML elixir 12.5-25 mg  12.5-25 mg Oral Q4H PRN Aundra Dubin, PA-C      . lisinopril (PRINIVIL,ZESTRIL) tablet 20 mg  20 mg Oral Daily [name], MD   20 mg at 05/13/16 0849   And  . hydrochlorothiazide (MICROZIDE) capsule 12.5 mg  12.5 mg Oral Daily [name], MD   12.5 mg at 05/13/16 0851  . HYDROmorphone (DILAUDID) tablet 2 mg  2 mg Oral Q3H PRN Aundra Dubin, PA-C   2 mg at 05/12/16 1732  . magnesium citrate solution 1 Bottle  1 Bottle Oral Once PRN Aundra Dubin, PA-C      . menthol-cetylpyridinium (CEPACOL) lozenge 3 mg  1 lozenge Oral PRN Aundra Dubin, PA-C       Or  . phenol (CHLORASEPTIC) mouth spray 1 spray  1 spray Mouth/Throat PRN Aundra Dubin,  PA-C      . metoCLOPramide (REGLAN) tablet 5-10 mg  5-10 mg Oral Q8H PRN Aundra Dubin, PA-C       Or  . metoCLOPramide (REGLAN) injection 5-10 mg  5-10 mg Intravenous Q8H PRN Aundra Dubin, PA-C      . ondansetron [facility]) tablet 4 mg  4 mg Oral Q6H PRN Aundra Dubin, PA-C       Or  . ondansetron [facility]) injection 4 mg  4 mg Intravenous Q6H PRN Aundra Dubin, PA-C      . polyethylene glycol (MIRALAX / GLYCOLAX) packet 17 g  17 g Oral Daily PRN Aundra Dubin, PA-C      . senna (SENOKOT) tablet 8.6 mg  1 tablet Oral BID Aundra Dubin, PA-C   8.6 mg at 05/13/16 0851  . zolpidem (AMBIEN) tablet 5 mg  5 mg Oral QHS PRN Aundra Dubin, PA-C         Discharge Medications: Please see discharge summary for a list of discharge medications.  Relevant Imaging Results:  Relevant Lab Results:   Additional Information SSN: [id number]  [name] Sauger, LCSW

## 2016-05-13 NOTE — Evaluation (Signed)
Occupational Therapy Evaluation Patient Details Name: [name] MRN: [id number] DOB: 1934/07/17 Today's Date: 05/13/2016    History of Present Illness Pt is a 82 y/o female s/p R TKR. PMH including but not limited to CKD, PVD, DM, HTN, and L TKR in 2009.   Clinical Impression   Pt is a pleasant and hard working 82 y/o. PTA Pt was independent in ADL, cooking, cleaning, driving. Pt has OT deficits listed below resulting in dependence in self-care tasks: decreased ROM, and strength. Pt would benefit from continued OT in the acute care setting, as well as her next venue of care to increase independence, strength, ROM, and functionality.    Follow Up Recommendations  SNF    Equipment Recommendations  Other (comment) (TBD by next venue of care)    Recommendations for Other Services       Precautions / Restrictions Precautions Precautions: Fall;Knee Precaution Comments: PT reviewed precautions and positioning following TKR surgery with pt. Restrictions Weight Bearing Restrictions: Yes RLE Weight Bearing: Weight bearing as tolerated      Mobility Bed Mobility Overal bed mobility: Needs Assistance Bed Mobility: Supine to Sit;Sit to Supine;Rolling Rolling: Mod assist   Supine to sit: Min assist;HOB elevated Sit to supine: Min assist   General bed mobility comments: Therapist provided mod support including support under both LE, and cueing for hand positioning with hand rails. Pt provided with vc for use of bed pan at EOS  Transfers Overall transfer level: Needs assistance               General transfer comment: Pt attempted STS and SPT multiple times; however, was unable to complete. After session, pt's nurse stated that the nursing team was able to transfer pt to bedside commode as a total dependent lift. PT encouraged use of lift equipment for transfers with pt.     Balance Overall balance assessment: Needs assistance Sitting-balance support: Bilateral upper  extremity supported Sitting balance-Leahy Scale: Poor                                      ADL Overall ADL's : Needs assistance/impaired Eating/Feeding: Set up;Sitting   Grooming: Brushing hair;Set up;Sitting   Upper Body Bathing: Minimal assitance;Sitting   Lower Body Bathing: Moderate assistance;Sitting/lateral leans   Upper Body Dressing : Set up;Sitting   Lower Body Dressing: Moderate assistance;Sitting/lateral leans Lower Body Dressing Details (indicate cue type and reason): Pt able to don/doff socks on LLE, and doff sock on RLE Toilet Transfer: +2 for physical assistance   Toileting- Clothing Manipulation and Hygiene: Maximal assistance         General ADL Comments: Pt willing to try anything therapy asked for self care with respect to new deficits due to her surgery     Vision     Perception     Praxis      Pertinent Vitals/Pain Pain Assessment: No/denies pain Pain Score: 5  Pain Location: R knee Pain Descriptors / Indicators: Pressure (when touching incision) Pain Intervention(s): Limited activity within patient's tolerance;Monitored during session;Repositioned     Hand Dominance Right   Extremity/Trunk Assessment Upper Extremity Assessment Upper Extremity Assessment: Overall WFL for tasks assessed;Generalized weakness   Lower Extremity Assessment Lower Extremity Assessment: RLE deficits/detail RLE Deficits / Details: Pt with decreased strength and ROM limitations secondary to post-op. Sensation grossly intact.       Communication Communication Communication: No difficulties  Cognition Arousal/Alertness: Awake/alert Behavior During Therapy: WFL for tasks assessed/performed Overall Cognitive Status: Within Functional Limits for tasks assessed                     General Comments       Exercises      Shoulder Instructions      Home Living Family/patient expects to be discharged to:: Skilled nursing facility Living  Arrangements: Children                                      Prior Functioning/Environment Level of Independence: Independent with assistive device(s)        Comments: Pt stated that she still drives, cleans, cooks, and performs all ADL's independently. Pt stated uses RW to ambulate.    OT Diagnosis: Generalized weakness;Acute pain   OT Problem List: Decreased strength;Decreased range of motion;Decreased activity tolerance;Impaired balance (sitting and/or standing);Decreased safety awareness;Decreased knowledge of use of DME or AE;Pain   OT Treatment/Interventions: Self-care/ADL training;Therapeutic exercise;DME and/or AE instruction;Therapeutic activities;Patient/family education    OT Goals(Current goals can be found in the care plan section) Acute Rehab OT Goals Patient Stated Goal: To be independent again OT Goal Formulation: With patient Time For Goal Achievement: 05/20/16 Potential to Achieve Goals: Good ADL Goals Pt Will Perform Upper Body Bathing: with set-up;sitting Pt Will Perform Lower Body Bathing: with min guard assist;with adaptive equipment;sitting/lateral leans Pt Will Perform Upper Body Dressing: with set-up;sitting Pt Will Perform Lower Body Dressing: with min guard assist;with adaptive equipment;sit to/from stand Pt Will Transfer to Toilet: with min guard assist;bedside commode Pt Will Perform Toileting - Clothing Manipulation and hygiene: with modified independence;sitting/lateral leans Pt Will Perform Tub/Shower Transfer: with min guard assist;3 in 1  OT Frequency: Min 3X/week   Barriers to D/C: Decreased caregiver support  Pt reports that at home, she has ramps to enter house and grab bars in the bathroom       Co-evaluation              End of Session CPM Right Knee CPM Right Knee: Off Nurse Communication: Other (comment) (outside room, waiting for Pt to complete urination in bedpan)  Activity Tolerance: Patient tolerated  treatment well Patient left:     Time: [id number] OT Time Calculation (min): 36 min Charges:  OT General Charges $OT Visit: 1 Procedure OT Evaluation $OT Eval Moderate Complexity: 1 Procedure OT Treatments $Self Care/Home Management : 8-22 mins G-Codes:    Merri Ray Vonn Sliger, OTR/L 05/13/2016, 2:05 PM [phone]

## 2016-05-13 NOTE — Care Management Important Message (Signed)
Important Message  Patient Details  Name: [name] MRN: [id number] Date of Birth: 1934-07-16   Medicare Important Message Given:  Yes    Loann Quill 05/13/2016, 11:46 AM

## 2016-05-13 NOTE — Care Management (Signed)
Patient s/p right total knee arthroplasty. Will need shortterm rehab at [facility]. Social worker notified.

## 2016-05-13 NOTE — Progress Notes (Signed)
Orthopedic Tech Progress Note Patient Details:  [name] 11-Dec-1933 [id number]  CPM Right Knee CPM Right Knee: On Right Knee Flexion (Degrees): 60 Right Knee Extension (Degrees): 0 Additional Comments: trapeze bar patient helper   [name] 05/13/2016, 7:01 PM

## 2016-05-13 NOTE — Progress Notes (Signed)
Orthopedic Tech Progress Note Patient Details:  [name] Sep 30, 1934 [id number]  Patient ID: [name], female   DOB: [birth year], 82 y.o.   MRN: [id number] Applied cpm 0-60  Karolee Stamps 05/13/2016, 6:03 AM

## 2016-05-14 LAB — CBC WITH DIFFERENTIAL/PLATELET
BASOS PCT: 0 %
Basophils Absolute: 0 10*3/uL (ref 0.0–0.1)
EOS ABS: 0.1 10*3/uL (ref 0.0–0.7)
EOS PCT: 1 %
HCT: 28.4 % — ABNORMAL LOW (ref 36.0–46.0)
Hemoglobin: 9.2 g/dL — ABNORMAL LOW (ref 12.0–15.0)
Lymphocytes Relative: 7 %
Lymphs Abs: 0.7 10*3/uL (ref 0.7–4.0)
MCH: 30.5 pg (ref 26.0–34.0)
MCHC: 32.4 g/dL (ref 30.0–36.0)
MCV: 94 fL (ref 78.0–100.0)
MONO ABS: 0.4 10*3/uL (ref 0.1–1.0)
MONOS PCT: 4 %
Neutro Abs: 8.8 10*3/uL — ABNORMAL HIGH (ref 1.7–7.7)
Neutrophils Relative %: 88 %
PLATELETS: 185 10*3/uL (ref 150–400)
RBC: 3.02 MIL/uL — ABNORMAL LOW (ref 3.87–5.11)
RDW: 15.1 % (ref 11.5–15.5)
WBC: 10 10*3/uL (ref 4.0–10.5)

## 2016-05-14 LAB — BASIC METABOLIC PANEL
Anion gap: 5 (ref 5–15)
BUN: 26 mg/dL — ABNORMAL HIGH (ref 6–20)
CALCIUM: 8.6 mg/dL — AB (ref 8.9–10.3)
CO2: 25 mmol/L (ref 22–32)
CREATININE: 1.38 mg/dL — AB (ref 0.44–1.00)
Chloride: 112 mmol/L — ABNORMAL HIGH (ref 101–111)
GFR, EST AFRICAN AMERICAN: 41 mL/min — AB (ref >60)
GFR, EST NON AFRICAN AMERICAN: 35 mL/min — AB (ref >60)
GLUCOSE: 112 mg/dL — AB (ref 65–99)
Potassium: 4.9 mmol/L (ref 3.5–5.1)
Sodium: 142 mmol/L (ref 135–145)

## 2016-05-14 LAB — CBC
HEMATOCRIT: 26.6 % — AB (ref 36.0–46.0)
Hemoglobin: 8.5 g/dL — ABNORMAL LOW (ref 12.0–15.0)
MCH: 30.1 pg (ref 26.0–34.0)
MCHC: 32 g/dL (ref 30.0–36.0)
MCV: 94.3 fL (ref 78.0–100.0)
PLATELETS: 171 10*3/uL (ref 150–400)
RBC: 2.82 MIL/uL — ABNORMAL LOW (ref 3.87–5.11)
RDW: 15.1 % (ref 11.5–15.5)
WBC: 10.7 10*3/uL — ABNORMAL HIGH (ref 4.0–10.5)

## 2016-05-14 NOTE — Clinical Social Work Placement (Signed)
   CLINICAL SOCIAL WORK PLACEMENT  NOTE  Date:  05/14/2016  Patient Details  Name: [name] MRN: [id number] Date of Birth: 1934-01-09  Clinical Social Work is seeking post-discharge placement for this patient at the [place] level of care (*CSW will initial, date and re-position this form in  chart as items are completed):  Yes   Patient/family provided with [facility]'s list of facilities offering this level of care within the geographic area requested by the patient (or if unable, by the patient's family).  Yes   Patient/family informed of their freedom to choose among providers that offer the needed level of care, that participate in Medicare, Medicaid or managed care program needed by the patient, have an available bed and are willing to accept the patient.  Yes   Patient/family informed of [facility]'s ownership interest in [facility] and [facility], as well as of the fact that they are under no obligation to receive care at these facilities.  PASRR submitted to EDS on       PASRR number received on       Existing PASRR number confirmed on 05/14/16     FL2 transmitted to all facilities in geographic area requested by pt/family on 05/14/16     FL2 transmitted to all facilities within larger geographic area on       Patient informed that his/her managed care company has contracts with or will negotiate with certain facilities, including the following:        Yes   Patient/family informed of bed offers received.  Patient chooses bed at [facility]     Physician recommends and patient chooses bed at      Patient to be transferred to [facility] on 05/14/16.  Patient to be transferred to facility by [facility]     Patient family notified on 05/14/16 of transfer.  Name of family member notified:  Patient     PHYSICIAN       Additional Comment:    _______________________________________________ [name] Sauger, LCSW 05/14/2016, 11:13 AM

## 2016-05-14 NOTE — Progress Notes (Signed)
Provided discharge information and answered all questions.

## 2016-05-14 NOTE — Clinical Social Work Note (Signed)
Clinical Social Work Assessment  Patient Details  Name: [name] MRN: [id number] Date of Birth: 10-17-1934  Date of referral:  05/14/16               Reason for consult:  Facility Placement                Permission sought to share information with:  Chartered certified accountant granted to share information::  Yes, Verbal Permission Granted  Name::        Agency::  [facility]  Relationship::     Contact Information:     Housing/Transportation Living arrangements for the past 2 months:  Single Family Home Source of Information:  Patient Patient Interpreter Needed:  None Criminal Activity/Legal Involvement Pertinent to Current Situation/Hospitalization:  No - Comment as needed Significant Relationships:  Adult Children Lives with:  Self Do you feel safe going back to the place where you live?  No Need for family participation in patient care:  No (Coment) (Patient able to make own decisions.)  Care giving concerns:  Patient expressed no concerns at this time.   Social Worker assessment / plan:  LCSW received referral stating patient for possible SNF placement at time of discharge. LCSW met with patient to discuss discharge planning. Patient to be discharged to [facility]. Patient to update patient's daughter regarding discharge. LCSW to continue to follow and assist with discharge planning needs.  Employment status:  Retired Forensic scientist:  Programmer, applications (Marine scientist) PT Recommendations:  [place] / Referral to community resources:  [place]  Patient/Family's Response to care:  Patient understanding and agreeable to [employer] of care.  Patient/Family's Understanding of and Emotional Response to Diagnosis, Current Treatment, and Prognosis:  Patient understanding and agreeable to LCSW plan of care.  Emotional Assessment Appearance:  Appears stated age Attitude/Demeanor/Rapport:  Other  (Appropriate) Affect (typically observed):  Accepting, Appropriate, Pleasant Orientation:  Oriented to Self, Oriented to Place, Oriented to  Time, Oriented to Situation Alcohol / Substance use:  Not Applicable Psych involvement (Current and /or in the community):  No (Comment) (Not appropriate on this admission.)  Discharge Needs  Concerns to be addressed:  No discharge needs identified Readmission within the last 30 days:  No Current discharge risk:  None Barriers to Discharge:  No Barriers Identified   [name] Sauger, LCSW 05/14/2016, 10:28 AM

## 2016-05-14 NOTE — Progress Notes (Signed)
Physical Therapy Treatment Patient Details Name: [name] MRN: [id number] DOB: Jul 06, 1934 Today's Date: 05/14/2016    History of Present Illness Pt is a 82 y/o female s/p R TKR. PMH including but not limited to CKD, PVD, DM, HTN, and L TKR in 2009.    PT Comments    Pt performed standing and progression to short gait distance.  Pt will d/c to SNF this pm for continued rehab.    Follow Up Recommendations  SNF     Equipment Recommendations  None recommended by PT    Recommendations for Other Services       Precautions / Restrictions Precautions Precautions: Fall;Knee Precaution Comments: PT reviewed precautions and positioning following TKR surgery with pt. Restrictions Weight Bearing Restrictions: Yes RLE Weight Bearing: Weight bearing as tolerated    Mobility  Bed Mobility Overal bed mobility: Needs Assistance Bed Mobility: Supine to Sit     Supine to sit: Supervision     General bed mobility comments: HOB elevated and heavy use of rail to advance to edge of bed.  No physical assistance needed.    Transfers Overall transfer level: Needs assistance Equipment used: Rolling walker (2 wheeled) Transfers: Sit to/from Stand Sit to Stand: Mod assist         General transfer comment: Pt required repeated attempts to perform sit to stand successfully.  Pt presents with posterior lean in standing and required cues for forward weight shifting and foot placement to improve ease of movement.  Pt slow to ascend.    Ambulation/Gait Ambulation/Gait assistance: Mod assist Ambulation Distance (Feet): 16 Feet Assistive device: Rolling walker (2 wheeled) Gait Pattern/deviations: Step-to pattern;Decreased stance time - right;Shuffle;Trunk flexed   Gait velocity interpretation: Below normal speed for age/gender General Gait Details: Cues for sequencing and upper trunk control.  Mild instability noted in R quad during stance phase.  Pt required cues for technique and close  chair follow for safety.  Educated patient to use Knee immobilizer at rehab.     Stairs            Wheelchair Mobility    Modified Rankin (Stroke Patients Only)       Balance     Sitting balance-Leahy Scale: Fair     Standing balance support: Bilateral upper extremity supported Standing balance-Leahy Scale: Poor                      Cognition Arousal/Alertness: Awake/alert Behavior During Therapy: WFL for tasks assessed/performed Overall Cognitive Status: Within Functional Limits for tasks assessed                      Exercises Total Joint Exercises Ankle Circles/Pumps: AROM;Both;10 reps;Supine Quad Sets: AROM;10 reps;Supine;Right Towel Squeeze: AROM;Both;10 reps;Supine Short Arc Quad: AAROM;Right;10 reps;Supine Heel Slides: AAROM;Right;10 reps;Supine Hip ABduction/ADduction: AAROM;Right;10 reps;Supine Straight Leg Raises: AAROM;Right;10 reps;Supine Goniometric ROM: 7-89 degrees.      General Comments        Pertinent Vitals/Pain Pain Assessment: 0-10 Pain Score: 4  Pain Location: R knee Pain Descriptors / Indicators: Tightness;Guarding;Grimacing;Discomfort Pain Intervention(s): Monitored during session;Repositioned    Home Living                      Prior Function            PT Goals (current goals can now be found in the care plan section) Acute Rehab PT Goals Patient Stated Goal: To be independent again Potential to Achieve Goals:  Fair Progress towards PT goals: Progressing toward goals    Frequency  7X/week    PT Plan Current plan remains appropriate    Co-evaluation             End of Session Equipment Utilized During Treatment: Gait belt Activity Tolerance: Patient limited by fatigue;Patient limited by pain Patient left: in bed;with call bell/phone within reach     Time: 1229-1259 PT Time Calculation (min) (ACUTE ONLY): 30 min  Charges:  $Gait Training: 8-22 mins $Therapeutic Exercise: 8-22  mins                    G Codes:      Cristela Blue 06/01/2016, 1:19 PM      Governor Rooks, PTA pager [phone]

## 2016-05-14 NOTE — Clinical Social Work Note (Signed)
Patient to be discharged to [facility]. Patient updated and will relay information to daughter. Patient to be transported via EMS. (1p at SNF request) RN report number: [place], [facility]: [phone] Surgical: [phone]

## 2016-05-14 NOTE — Progress Notes (Signed)
[place] and gave report to [place].

## 2016-05-14 NOTE — Progress Notes (Signed)
Subjective: 2 Days Post-Op Procedure(s) (LRB): TOTAL KNEE ARTHROPLASTY (Right) Patient reports pain as mild.  No nausea/vomiting, lightheadedness/dizziness, chest pain/pressure/palpitations or sob.  Tolerating diet.  Objective: Vital signs in last 24 hours: Temp:  [98.4 F (36.9 C)-98.8 F (37.1 C)] 98.4 F (36.9 C) (07/28 0516) Pulse Rate:  [80-81] 81 (07/27 2046) Resp:  [16] 16 (07/27 1300) BP: (127-129)/(61-78) 129/75 (07/28 0516) SpO2:  [92 %-97 %] 94 % (07/28 0516)  Intake/Output from previous day: 07/27 0701 - 07/28 0700 In: 1200 [P.O.:600; I.V.:600] Out: -  Intake/Output this shift: No intake/output data recorded.   Recent Labs  05/13/16 0510  HGB 8.4*    Recent Labs  05/13/16 0510  WBC 7.4  RBC 2.77*  HCT 26.4*  PLT 174    Recent Labs  05/13/16 0510 05/14/16 0403  NA 139 142  K 4.5 4.9  CL 109 112*  CO2 25 25  BUN 25* 26*  CREATININE 1.41* 1.38*  GLUCOSE 102* 112*  CALCIUM 8.3* 8.6*   No results for input(s): LABPT, INR in the last 72 hours.  Neurologically intact Neurovascular intact Sensation intact distally Intact pulses distally Dorsiflexion/Plantar flexion intact Incision: dressing C/D/I No cellulitis present Compartment soft  Assessment/Plan: 2 Days Post-Op Procedure(s) (LRB): TOTAL KNEE ARTHROPLASTY (Right) Advance diet Up with therapy Discharge to SNF once approved by insurance WBAT RLE ABLA-awaiting cbc from 7/28.  She is however asymptomatic Dry dressing change prn  [name] 05/14/2016, 7:57 AM

## 2016-05-17 ENCOUNTER — Other Ambulatory Visit: Payer: Self-pay

## 2016-05-17 ENCOUNTER — Encounter: Payer: Self-pay | Admitting: Adult Health

## 2016-05-17 ENCOUNTER — Non-Acute Institutional Stay (SKILLED_NURSING_FACILITY): Payer: Medicare Other | Admitting: Adult Health

## 2016-05-17 DIAGNOSIS — M1711 Unilateral primary osteoarthritis, right knee: Secondary | ICD-10-CM

## 2016-05-17 DIAGNOSIS — N183 Chronic kidney disease, stage 3 unspecified: Secondary | ICD-10-CM

## 2016-05-17 DIAGNOSIS — D62 Acute posthemorrhagic anemia: Secondary | ICD-10-CM

## 2016-05-17 DIAGNOSIS — E119 Type 2 diabetes mellitus without complications: Secondary | ICD-10-CM

## 2016-05-17 DIAGNOSIS — I10 Essential (primary) hypertension: Secondary | ICD-10-CM

## 2016-05-17 MED ORDER — HYDROMORPHONE HCL 2 MG PO TABS: 2.0000 mg | ORAL_TABLET | ORAL | 0 refills | Status: DC | PRN

## 2016-05-17 NOTE — Telephone Encounter (Signed)
Rx faxed to [facility] @ [phone], phone number [phone]  

## 2016-05-17 NOTE — Progress Notes (Signed)
Patient ID: [name], female   DOB: [birth year], 82 y.o.   MRN: [id number]    DATE:  05/17/2016   MRN:  [id number]  BIRTHDAY: 1934-04-10  Facility:  [facility] Home Location:  [place] Room Number: [id number]  LEVEL OF CARE:  SNF (31)  Contact Information    Name Relation Home Work [place] Daughter [phone]  [phone]       Code Status History    Date Active Date Inactive Code Status Order ID Comments User Context   05/12/2016  2:48 PM 05/14/2016  4:40 PM Full Code [id number]  Aundra Dubin, PA-C Inpatient       Chief Complaint  Patient presents with  . Hospitalization Follow-up    HISTORY OF PRESENT ILLNESS:  This is a 82 year old female who has been admitted to [facility] on 05/14/16 from [facility]. She has PMH of chronic renal insufficiency, Diabetes mellitus, type II, hypertension, osteopenia, PAD and personal history of colonic polyps. She has primary osteoarthritis of right knee for which she had right total knee arthroplasty on 05/12/16. She has been admitted for a short-term rehabilitation.  She is seen today and verbalized that her pain is well controlled.  PAST MEDICAL HISTORY:  Past Medical History:  Diagnosis Date  . Arthritis of knee    bilateral    . Chronic renal insufficiency   . Diabetes mellitus, type 2 ([place])   . Hypertension   . Osteopenia   . PAD (peripheral artery disease) ([place])   . Personal history of colonic polyps 3 years ago    colonoscopy      CURRENT MEDICATIONS: Reviewed  Patient's Medications  New Prescriptions   No medications on file  Previous Medications   ASPIRIN EC 325 MG TABLET    Take 1 tablet (325 mg total) by mouth daily.   CALCIUM CARBONATE-VITAMIN D (CALTRATE 600+D) 600-400 MG-UNIT PER TABLET    Take 1 tablet by mouth 2 (two) times daily.     DIPHENHYDRAMINE (BENADRYL) 50 MG TABLET    Take 50 mg by mouth at bedtime as needed for itching or allergies.    HYDROMORPHONE (DILAUDID) 2 MG TABLET    Take 1 tablet (2 mg total) by mouth every 4 (four) hours as needed for severe pain.   LISINOPRIL-HYDROCHLOROTHIAZIDE (PRINZIDE,ZESTORETIC) 20-12.5 MG TABLET    TAKE ONE TABLET BY MOUTH ONCE DAILY   MULTIVITAMIN (THERAGRAN) PER TABLET    Take 1 tablet by mouth daily.     ONDANSETRON (ZOFRAN) 4 MG TABLET    Take 1 tablet (4 mg total) by mouth every 8 (eight) hours as needed for nausea or vomiting.  Modified Medications   No medications on file  Discontinued Medications   No medications on file     Allergies  Allergen Reactions  . Codeine Hives  . Fosamax [Alendronate Sodium] Other (See Comments)    Pt reports excessive urination on the day she took the pill     REVIEW OF SYSTEMS:  GENERAL: no change in appetite, no fatigue, no weight changes, no fever, chills or weakness EYES: Denies change in vision, dry eyes, eye pain, itching or discharge EARS: Denies change in hearing, ringing in ears, or earache NOSE: Denies nasal congestion or epistaxis MOUTH and THROAT: Denies oral discomfort, gingival pain or bleeding, pain from teeth or hoarseness   RESPIRATORY: no cough, SOB, DOE, wheezing, hemoptysis CARDIAC: no chest pain, edema or palpitations GI: no abdominal pain, diarrhea,  constipation, heart burn, nausea or vomiting GU: Denies dysuria, frequency, hematuria, incontinence, or discharge PSYCHIATRIC: Denies feeling of depression or anxiety. No report of hallucinations, insomnia, paranoia, or agitation   PHYSICAL EXAMINATION  GENERAL APPEARANCE: Well nourished. In no acute distress. Normal body habitus SKIN:  Right knee surgical incision is covered with Aquacel dressing and Ace wrap, dry HEAD: Normal in size and contour. No evidence of trauma EYES: Lids open and close normally. No blepharitis, entropion or ectropion. PERRL. Conjunctivae are clear and sclerae are white. Lenses are without opacity EARS: Pinnae are normal. Patient hears normal  voice tunes of the examiner MOUTH and THROAT: Lips are without lesions. Oral mucosa is moist and without lesions. Tongue is normal in shape, size, and color and without lesions NECK: supple, trachea midline, no neck masses, no thyroid tenderness, no thyromegaly LYMPHATICS: no LAN in the neck, no supraclavicular LAN RESPIRATORY: breathing is even & unlabored, BS CTAB CARDIAC: RRR, no murmur,no extra heart sounds, no edema GI: abdomen soft, normal BS, no masses, no tenderness, no hepatomegaly, no splenomegaly EXTREMITIES:  Able to move X 4 extremities PSYCHIATRIC: Alert and oriented X 3. Affect and behavior are appropriate  LABS/RADIOLOGY: Labs reviewed: Basic Metabolic Panel:  Recent Labs  04/30/16 1308 05/13/16 0510 05/14/16 0403  NA 141 139 142  K 3.7 4.5 4.9  CL 106 109 112*  CO2 26 25 25   GLUCOSE 101* 102* 112*  BUN 31* 25* 26*  CREATININE 1.37* 1.41* 1.38*  CALCIUM 9.3 8.3* 8.6*   Liver Function Tests:  Recent Labs  06/09/15 0938 11/25/15 1135 03/18/16 1235  AST 29 33 31  ALT 20 15 13   ALKPHOS 72 69 64  BILITOT 0.6 0.6 0.6  PROT 6.0* 6.7 6.3  ALBUMIN 3.6 3.8 3.3*   CBC:  Recent Labs  05/13/16 0510 05/14/16 0403 05/14/16 0852  WBC 7.4 10.7* 10.0  NEUTROABS  --   --  8.8*  HGB 8.4* 8.5* 9.2*  HCT 26.4* 26.6* 28.4*  MCV 95.3 94.3 94.0  PLT 174 171 185   Lipid Panel:  Recent Labs  06/09/15 1118  HDL 64   CBG:  Recent Labs  05/12/16 0932 05/12/16 1254 05/12/16 1653  GLUCAP 95 118* 121*      Dg Knee Right Port  Result Date: 05/12/2016 CLINICAL DATA:  Status post total right knee arthroplasty. EXAM: PORTABLE RIGHT KNEE - 1-2 VIEW COMPARISON:  01/02/2007 FINDINGS: New right knee prosthetic components are well-seated and well-aligned. There is no acute fracture or evidence of an operative complication. IMPRESSION: Well-positioned total right knee arthroplasty. Electronically Signed   By: Lajean Manes M.D.   On: 05/12/2016  13:20   ASSESSMENT/PLAN:  Right knee osteoarthritis S/P right total knee arthroplasty - for rehabilitation, PT and OT; continue aspirin EC 325 mg 1 tab by mouth daily for DVT prophylaxis; Dilaudid 2 mg 1 tab by mouth every 4 hours when necessary for pain; follow-up with Dr. Kathryne Hitch (orthopedic surgeon), in 2 weeks  Hypertension - continue lisinopril-hydrochlorothiazide 20-12.5 mg 1 tab by mouth daily  Anemia, acute blood loss - check CBC Lab Results  Component Value Date   HGB 9.2 (L) 05/14/2016   Diabetes mellitus, type II - diet controlled Lab Results  Component Value Date   HGBA1C 6.0 (H) 03/18/2016   Chronic renal insufficiency, stage 3 - check CMP Lab Results  Component Value Date   CREATININE 1.38 (H) 05/14/2016       Goals of care:  Short-term rehabilitation  Durenda Age, NP [employer] [phone]

## 2016-05-18 ENCOUNTER — Non-Acute Institutional Stay (SKILLED_NURSING_FACILITY): Payer: Medicare Other | Admitting: Internal Medicine

## 2016-05-18 ENCOUNTER — Encounter: Payer: Self-pay | Admitting: Internal Medicine

## 2016-05-18 DIAGNOSIS — I10 Essential (primary) hypertension: Secondary | ICD-10-CM | POA: Diagnosis not present

## 2016-05-18 DIAGNOSIS — K5901 Slow transit constipation: Secondary | ICD-10-CM | POA: Diagnosis not present

## 2016-05-18 DIAGNOSIS — N183 Chronic kidney disease, stage 3 unspecified: Secondary | ICD-10-CM

## 2016-05-18 DIAGNOSIS — R2681 Unsteadiness on feet: Secondary | ICD-10-CM | POA: Diagnosis not present

## 2016-05-18 DIAGNOSIS — D62 Acute posthemorrhagic anemia: Secondary | ICD-10-CM

## 2016-05-18 DIAGNOSIS — M1711 Unilateral primary osteoarthritis, right knee: Secondary | ICD-10-CM

## 2016-05-18 DIAGNOSIS — E119 Type 2 diabetes mellitus without complications: Secondary | ICD-10-CM | POA: Diagnosis not present

## 2016-05-18 LAB — BASIC METABOLIC PANEL
BUN: 19 mg/dL (ref 4–21)
Creatinine: 1.1 mg/dL (ref 0.5–1.1)
Glucose: 101 mg/dL
Potassium: 4.2 mmol/L (ref 3.4–5.3)
Sodium: 143 mmol/L (ref 137–147)

## 2016-05-18 LAB — CBC AND DIFFERENTIAL
HCT: 25 % — AB (ref 36–46)
HEMOGLOBIN: 8.3 g/dL — AB (ref 12.0–16.0)
NEUTROS ABS: 3 /uL
PLATELETS: 202 10*3/uL (ref 150–399)
WBC: 4.5 10^3/mL

## 2016-05-18 LAB — HEPATIC FUNCTION PANEL
ALK PHOS: 53 U/L (ref 25–125)
ALT: 8 U/L (ref 7–35)
AST: 28 U/L (ref 13–35)
BILIRUBIN, TOTAL: 0.7 mg/dL

## 2016-05-18 NOTE — Progress Notes (Signed)
LOCATION: [facility]  PCP: Tula Nakayama, MD   Code Status: Full Code  Goals of care: Advanced Directive information Advanced Directives 05/06/2016  Does patient have an advance directive? No  Copy of advanced directive(s) in chart? -  Would patient like information on creating an advanced directive? Yes - Scientist, clinical (histocompatibility and immunogenetics) given       Extended Emergency Contact Information Primary Emergency Contact: [place] Address: [address]          [place], [place] [address] [place] Phone: [phone] Mobile Phone: [phone] Relation: Daughter   Allergies  Allergen Reactions  . Codeine Hives  . Fosamax [Alendronate Sodium] Other (See Comments)    Pt reports excessive urination on the day she took the pill    Chief Complaint  Patient presents with  . New Admit To SNF    New Admission     HPI:  Patient is a 82 y.o. female seen today for short term rehabilitation post hospital admission from 05/12/16-05/14/16 with right knee OA. She underwent right total knee arthroplasty.  Review of Systems:  Constitutional: Negative for fever, chills. Energy level is slowly coming back. HENT: Negative for headache, congestion, nasal discharge Eyes: Negative for blurred vision, double vision and discharge.  Respiratory: Negative for cough, shortness of breath and wheezing.   Cardiovascular: Negative for chest pain, palpitations, leg swelling.  Gastrointestinal: Negative for heartburn, nausea, vomiting, abdominal pain. Last bowel movement was on Sunday.  Genitourinary: Negative for dysuria and flank pain.  Musculoskeletal: Negative for back pain, fall in the facility.  Skin: Negative for itching, rash.  Neurological: Negative for dizziness. Psychiatric/Behavioral: Negative for depression   Past Medical History:  Diagnosis Date  . Arthritis of knee    bilateral    . Chronic renal insufficiency   . Diabetes mellitus, type 2 ([place])   .  Hypertension   . Osteopenia   . PAD (peripheral artery disease) ([place])   . Personal history of colonic polyps 3 years ago    colonoscopy    Past Surgical History:  Procedure Laterality Date  . bunionectomy bilateral  feet    . DENTAL SURGERY    . gum disease  2010   treated by Dr. Andree Elk   . Left knee manipulation  under anasthesia due to contracture -  03/08/08   Dr. Aline Brochure   . left knee replacement  08/14/08   Dr. Aline Brochure   . TOTAL ABDOMINAL HYSTERECTOMY     bleeding   . TOTAL KNEE ARTHROPLASTY Right 05/12/2016   Procedure: TOTAL KNEE ARTHROPLASTY;  Surgeon: [name], MD;  Location: [facility];  Service: Orthopedics;  Laterality: Right;  . WISDOM TOOTH EXTRACTION     Social History:   reports that she has quit smoking. She has quit using smokeless tobacco. She reports that she does not drink alcohol or use drugs.  Family History  Problem Relation Age of Onset  . Colon cancer Father   . Cancer Mother   . Stroke Mother   . Hypertension Brother     Medications:   Medication List       Accurate as of 05/18/16 11:57 AM. Always use your most recent med list.          aspirin EC 325 MG tablet Take 1 tablet (325 mg total) by mouth daily.   CALTRATE 600+D 600-400 MG-UNIT tablet Generic drug:  Calcium Carbonate-Vitamin D Take 1 tablet by mouth 2 (two) times daily.   diphenhydrAMINE 50 MG  tablet Commonly known as:  BENADRYL Take 50 mg by mouth at bedtime as needed for itching or allergies.   HYDROmorphone 2 MG tablet Commonly known as:  DILAUDID Take 1 tablet (2 mg total) by mouth every 4 (four) hours as needed for severe pain.   lisinopril-hydrochlorothiazide 20-12.5 MG tablet Commonly known as:  PRINZIDE,ZESTORETIC TAKE ONE TABLET BY MOUTH ONCE DAILY   multivitamin per tablet Take 1 tablet by mouth daily.   ondansetron 4 MG tablet Commonly known as:  ZOFRAN Take 1 tablet (4 mg total) by mouth every 8 (eight) hours as needed for nausea or vomiting.        Immunizations: Immunization History  Administered Date(s) Administered  . Influenza Split 07/29/2011, 08/16/2012  . Influenza Whole 08/15/2006, 08/07/2007, 07/08/2008, 09/03/2009, 07/02/2010  . Influenza,inj,Quad PF,36+ Mos 07/10/2013, 09/24/2014, 08/12/2015  . PPD Test 05/14/2016  . Pneumococcal Conjugate-13 05/03/2014  . Pneumococcal Polysaccharide-23 04/25/2007  . Td 09/03/2009  . Zoster 10/23/2009     Physical Exam: Vitals:   05/18/16 1154  BP: (!) 144/85  Pulse: 96  Resp: 16  Temp: 99.3 F (37.4 C)  TempSrc: Oral  SpO2: 95%  Weight: 170 lb (77.1 kg)  Height: 5\' 6"  (1.676 m)   Body mass index is 27.44 kg/m.  General- elderly female, well built, in no acute distress Head- normocephalic, atraumatic Nose- no nasal discharge Throat- moist mucus membrane Eyes- PERRLA, EOMI, no pallor, no icterus, no discharge Neck- no cervical lymphadenopathy Cardiovascular- normal s1,s2, no murmur, ectopic beats present, 1+ leg edema Respiratory- bilateral clear to auscultation, no wheeze, no rhonchi, no crackles, no use of accessory muscles Abdomen- bowel sounds present, soft, non tender Musculoskeletal- able to move all 4 extremities, generalized weakness with limited ROM to right knee, palpable dorsalis pedis Neurological- alert and oriented to person, place and time Skin- warm and dry, ace wrap to right knee and leg Psychiatry- normal mood and affect    Labs reviewed: Basic Metabolic Panel:  Recent Labs  04/30/16 1308 05/13/16 0510 05/14/16 0403  NA 141 139 142  K 3.7 4.5 4.9  CL 106 109 112*  CO2 26 25 25   GLUCOSE 101* 102* 112*  BUN 31* 25* 26*  CREATININE 1.37* 1.41* 1.38*  CALCIUM 9.3 8.3* 8.6*   Liver Function Tests:  Recent Labs  06/09/15 0938 11/25/15 1135 03/18/16 1235  AST 29 33 31  ALT 20 15 13   ALKPHOS 72 69 64  BILITOT 0.6 0.6 0.6  PROT 6.0* 6.7 6.3  ALBUMIN 3.6 3.8 3.3*   No results for input(s): LIPASE, AMYLASE in the last 8760  hours. No results for input(s): AMMONIA in the last 8760 hours. CBC:  Recent Labs  05/13/16 0510 05/14/16 0403 05/14/16 0852  WBC 7.4 10.7* 10.0  NEUTROABS  --   --  8.8*  HGB 8.4* 8.5* 9.2*  HCT 26.4* 26.6* 28.4*  MCV 95.3 94.3 94.0  PLT 174 171 185   Cardiac Enzymes: No results for input(s): CKTOTAL, CKMB, CKMBINDEX, TROPONINI in the last 8760 hours. BNP: Invalid input(s): POCBNP CBG:  Recent Labs  05/12/16 0932 05/12/16 1254 05/12/16 1653  GLUCAP 95 118* 121*    Radiological Exams: Dg Knee Right Port  Result Date: 05/12/2016 CLINICAL DATA:  Status post total right knee arthroplasty. EXAM: PORTABLE RIGHT KNEE - 1-2 VIEW COMPARISON:  01/02/2007 FINDINGS: New right knee prosthetic components are well-seated and well-aligned. There is no acute fracture or evidence of an operative complication. IMPRESSION: Well-positioned total right knee arthroplasty. Electronically Signed  By: Lajean Manes M.D.   On: 05/12/2016 13:20   Assessment/Plan  Unsteady gait Will have patient work with PT/OT as tolerated to regain strength and restore function.  Fall precautions are in place.  Right knee osteoarthritis  S/P right total knee arthroplasty. Will have her work with physical therapy and occupational therapy team to help with gait training and muscle strengthening exercises.fall precautions. Skin care. Encourage to be out of bed. Has orthopedics follow up. Continue aspirin ec 325 mg daily for DVT prophylaxis. Continue dilaudid 2 mg q4h prn pain.   Constipation Add prunes in am. Add colace 100 mg bid and miralax daily as needed and monitor  Blood loss anemia Check cbc  Hypertension Monitor BP bid for now. continue lisinopril-hydrochlorothiazide 20-12.5 mg daily. Check bmp  Diabetes mellitus type 2 with PVD Monitor cbg, diet controlled  ckd stage 3 Monitor bmp, avoid nephrotoxic agents   Goals of care: short term rehabilitation   Labs/tests ordered: cbc, bmp  Family/  staff Communication: reviewed care plan with patient and nursing supervisor    Blanchie Serve, MD Internal Medicine [place], [place] [address] Cell Phone (Monday-Friday 8 am - 5 pm): [phone] On Call: [phone] and follow prompts after 5 pm and on weekends Office Phone: [phone] Office Fax: [phone]

## 2016-05-27 LAB — CBC AND DIFFERENTIAL
HCT: 26 % — AB (ref 36–46)
HEMOGLOBIN: 8.1 g/dL — AB (ref 12.0–16.0)
Neutrophils Absolute: 2 /uL
Platelets: 314 10*3/uL (ref 150–399)
WBC: 4.1 10*3/mL

## 2016-06-07 ENCOUNTER — Encounter: Payer: Self-pay | Admitting: Adult Health

## 2016-06-07 ENCOUNTER — Non-Acute Institutional Stay (SKILLED_NURSING_FACILITY): Payer: Medicare Other | Admitting: Adult Health

## 2016-06-07 DIAGNOSIS — I82401 Acute embolism and thrombosis of unspecified deep veins of right lower extremity: Secondary | ICD-10-CM

## 2016-06-07 DIAGNOSIS — D62 Acute posthemorrhagic anemia: Secondary | ICD-10-CM | POA: Diagnosis not present

## 2016-06-07 DIAGNOSIS — I83811 Varicose veins of right lower extremities with pain: Secondary | ICD-10-CM | POA: Diagnosis not present

## 2016-06-07 DIAGNOSIS — M1711 Unilateral primary osteoarthritis, right knee: Secondary | ICD-10-CM

## 2016-06-07 NOTE — Progress Notes (Signed)
Patient ID: [name], female   DOB: 1934/05/30, 82 y.o.   MRN: [id number]    DATE:  06/07/2016   MRN:  [id number]  BIRTHDAY: [birth year]  Facility[facility] Location:  [facility] Room Number: [id number]  LEVEL OF CARE:  SNF [phone])  Contact Information    Name Relation Home Work [place] Daughter [phone]  [phone]   Cortasia, [place]   [phone]   Mapp,Shirlisa Daughter   [phone]       Code Status History    Date Active Date Inactive Code Status Order ID Comments User Context   05/12/2016  2:48 PM 05/14/2016  4:40 PM Full Code [id number]  Aundra Dubin, PA-C Inpatient       Chief Complaint  Patient presents with  . Acute Visit    Deep venous thrombosis right lower extremity    HISTORY OF PRESENT ILLNESS:  This is a 82 year old female who is being seen due to a venous doppler ultrasound done on RLE which is + for DVT. She was noted to have RLE edema, 2+ and was complaining of pain. On call provider started patient on Lovenox 77 mg SQ BID and was put on bedrest X 24 hours.  She has been admitted to [facility] on 05/14/16 from [facility]. She has PMH of chronic renal insufficiency, Diabetes mellitus, type II, hypertension, osteopenia, PAD and personal history of colonic polyps. She has primary osteoarthritis of right knee for which she had right total knee arthroplasty on 05/12/16.   She has been having short-term rehabilitation.  PAST MEDICAL HISTORY:  Past Medical History:  Diagnosis Date  . Arthritis of knee    bilateral    . Chronic renal insufficiency   . Diabetes mellitus, type 2 ([place])   . Hypertension   . Osteopenia   . PAD (peripheral artery disease) ([place])   . Personal history of colonic polyps 3 years ago    colonoscopy      CURRENT MEDICATIONS: Reviewed  Patient's Medications  New Prescriptions   No medications on file  Previous Medications   ACETAMINOPHEN (TYLENOL) 325 MG  TABLET    Take 650 mg by mouth every 4 (four) hours as needed.   ASPIRIN EC 325 MG TABLET    Take 1 tablet (325 mg total) by mouth daily.   CALCIUM CARBONATE-VITAMIN D (CALTRATE 600+D) 600-400 MG-UNIT PER TABLET    Take 1 tablet by mouth 2 (two) times daily.     DIPHENHYDRAMINE (BENADRYL) 50 MG TABLET    Take 50 mg by mouth at bedtime as needed for itching or allergies.   DOCUSATE SODIUM (COLACE) 100 MG CAPSULE    Take 100 mg by mouth 2 (two) times daily.   ENOXAPARIN (LOVENOX) 80 MG/0.8ML INJECTION    Inject 77 mg into the skin every 12 (twelve) hours.   HYDROMORPHONE (DILAUDID) 2 MG TABLET    Take 1 tablet (2 mg total) by mouth every 4 (four) hours as needed for severe pain.   LISINOPRIL-HYDROCHLOROTHIAZIDE (PRINZIDE,ZESTORETIC) 20-12.5 MG TABLET    TAKE ONE TABLET BY MOUTH ONCE DAILY   MULTIVITAMIN (THERAGRAN) PER TABLET    Take 1 tablet by mouth daily.     ONDANSETRON (ZOFRAN) 4 MG TABLET    Take 1 tablet (4 mg total) by mouth every 8 (eight) hours as needed for nausea or vomiting.   POLYETHYLENE GLYCOL (MIRALAX / GLYCOLAX) PACKET    Take 17 g by mouth daily  as needed.   RIVAROXABAN (XARELTO) 15 MG TABS TABLET    Take 15 mg by mouth 2 (two) times daily with a meal. 15 mg - 1 tab BID x21 days, then 20 mg qd  Modified Medications   No medications on file  Discontinued Medications   No medications on file     Allergies  Allergen Reactions  . Codeine Hives  . Fosamax [Alendronate Sodium] Other (See Comments)    Pt reports excessive urination on the day she took the pill     REVIEW OF SYSTEMS:  GENERAL: no change in appetite, no fatigue, no weight changes, no fever, chills or weakness EYES: Denies change in vision, dry eyes, eye pain, itching or discharge EARS: Denies change in hearing, ringing in ears, or earache NOSE: Denies nasal congestion or epistaxis MOUTH and THROAT: Denies oral discomfort, gingival pain or bleeding, pain from teeth or hoarseness   RESPIRATORY: no cough, SOB,  DOE, wheezing, hemoptysis CARDIAC: no chest pain, or palpitations GI: no abdominal pain, diarrhea, constipation, heart burn, nausea or vomiting GU: Denies dysuria, frequency, hematuria, incontinence, or discharge PSYCHIATRIC: Denies feeling of depression or anxiety. No report of hallucinations, insomnia, paranoia, or agitation   PHYSICAL EXAMINATION  GENERAL APPEARANCE: Well nourished. In no acute distress. Normal body habitus SKIN:  Right knee surgical incision is dry, no erythema HEAD: Normal in size and contour. No evidence of trauma EYES: Lids open and close normally. No blepharitis, entropion or ectropion. PERRL. Conjunctivae are clear and sclerae are white. Lenses are without opacity EARS: Pinnae are normal. Patient hears normal voice tunes of the examiner MOUTH and THROAT: Lips are without lesions. Oral mucosa is moist and without lesions. Tongue is normal in shape, size, and color and without lesions NECK: supple, trachea midline, no neck masses, no thyroid tenderness, no thyromegaly LYMPHATICS: no LAN in the neck, no supraclavicular LAN RESPIRATORY: breathing is even & unlabored, BS CTAB CARDIAC: RRR, no murmur,no extra heart sounds, BLE 2+ edema GI: abdomen soft, normal BS, no masses, no tenderness, no hepatomegaly, no splenomegaly EXTREMITIES:  Able to move X 4 extremities PSYCHIATRIC: Alert and oriented X 3. Affect and behavior are appropriate  LABS/RADIOLOGY: Labs reviewed: Basic Metabolic Panel:  Recent Labs  04/30/16 1308 05/13/16 0510 05/14/16 0403 05/18/16 1400  NA 141 139 142 143  K 3.7 4.5 4.9 4.2  CL 106 109 112*  --   CO2 26 25 25   --   GLUCOSE 101* 102* 112*  --   BUN 31* 25* 26* 19  CREATININE 1.37* 1.41* 1.38* 1.1  CALCIUM 9.3 8.3* 8.6*  --    Liver Function Tests:  Recent Labs  06/09/15 0938 11/25/15 1135 03/18/16 1235 05/18/16 1400  AST 29 33 31 28  ALT 20 15 13 8   ALKPHOS 72 69 64 53  BILITOT 0.6 0.6 0.6  --   PROT 6.0* 6.7 6.3  --    ALBUMIN 3.6 3.8 3.3*  --    CBC:  Recent Labs  05/13/16 0510 05/14/16 0403 05/14/16 0852 05/18/16 1400 05/27/16 1152  WBC 7.4 10.7* 10.0 4.5 4.1  NEUTROABS  --   --  8.8* 3 2  HGB 8.4* 8.5* 9.2* 8.3* 8.1*  HCT 26.4* 26.6* 28.4* 25* 26*  MCV 95.3 94.3 94.0  --   --   PLT 174 171 185 202 314   Lipid Panel:  Recent Labs  06/09/15 1118  HDL 64   CBG:  Recent Labs  05/12/16 0932 05/12/16 1254 05/12/16 1653  GLUCAP 95 118* 121*      Dg Knee Right Port  Result Date: 05/12/2016 CLINICAL DATA:  Status post total right knee arthroplasty. EXAM: PORTABLE RIGHT KNEE - 1-2 VIEW COMPARISON:  01/02/2007 FINDINGS: New right knee prosthetic components are well-seated and well-aligned. There is no acute fracture or evidence of an operative complication. IMPRESSION: Well-positioned total right knee arthroplasty. Electronically Signed   By: Lajean Manes M.D.   On: 05/12/2016 13:20   ASSESSMENT/PLAN:  DVT on RLE - continue Lovenox 77 mg SQ BID for today, then start Xarelto 15 mg 1 tab PO BID X 21 days then 20 mg 1 tab daily ; discontinue ASA; discontinue Lovenox once Xarelto has been started.; check BMP on 06/11/16  RLE pain -continue Dilaudid 2 mg 1 tab PO Q 4 hours PRN  Right knee OA S/P right total knee arthroplasty - discontinue ASA, continue Lovenox today then one Xarelto is available, will discontinue Lovenox  Anemia, acute blood loss - check CBC on 06/11/16 Lab Results  Component Value Date   HGB 8.1 (A) 05/27/2016      Durenda Age, NP [employer] [phone]

## 2016-06-16 ENCOUNTER — Non-Acute Institutional Stay (SKILLED_NURSING_FACILITY): Payer: Medicare Other | Admitting: Adult Health

## 2016-06-16 ENCOUNTER — Encounter: Payer: Self-pay | Admitting: Adult Health

## 2016-06-16 DIAGNOSIS — D62 Acute posthemorrhagic anemia: Secondary | ICD-10-CM

## 2016-06-16 DIAGNOSIS — I82401 Acute embolism and thrombosis of unspecified deep veins of right lower extremity: Secondary | ICD-10-CM | POA: Diagnosis not present

## 2016-06-16 DIAGNOSIS — N183 Chronic kidney disease, stage 3 unspecified: Secondary | ICD-10-CM

## 2016-06-16 DIAGNOSIS — M1711 Unilateral primary osteoarthritis, right knee: Secondary | ICD-10-CM | POA: Diagnosis not present

## 2016-06-16 DIAGNOSIS — E119 Type 2 diabetes mellitus without complications: Secondary | ICD-10-CM

## 2016-06-16 DIAGNOSIS — R2681 Unsteadiness on feet: Secondary | ICD-10-CM | POA: Diagnosis not present

## 2016-06-16 DIAGNOSIS — I10 Essential (primary) hypertension: Secondary | ICD-10-CM

## 2016-06-16 DIAGNOSIS — K5901 Slow transit constipation: Secondary | ICD-10-CM | POA: Diagnosis not present

## 2016-06-16 NOTE — Progress Notes (Signed)
Patient ID: [name], female   DOB: [birth year], 82 y.o.   MRN: [id number]    DATE:  06/16/2016   MRN:  [id number]  BIRTHDAY: 08-Oct-1934  Facility:  [facility] Home Location:  [facility] Room Number: [id number]  LEVEL OF CARE:  SNF [phone])  Contact Information    Name Relation Home Work [place] Daughter [phone]  [phone]   Sharmaine, [place]   [phone]   Mapp,Shirlisa Daughter   [phone]       Code Status History    Date Active Date Inactive Code Status Order ID Comments User Context   05/12/2016  2:48 PM 05/14/2016  4:40 PM Full Code [id number]  Aundra Dubin, PA-C Inpatient       Chief Complaint  Patient presents with  . Discharge Note    HISTORY OF PRESENT ILLNESS:  This is a 82 year old female who is for discharge home with Home health PT, OT, CNA and Nursing.   She has been admitted to [facility] on 05/14/16 from [facility]. She has PMH of chronic renal insufficiency, Diabetes mellitus, type II, hypertension, osteopenia, PAD and personal history of colonic polyps. She has primary osteoarthritis of right knee for which she had right total knee arthroplasty on 05/12/16.   She was recently diagnosed with RLE edema and was started on Xarelto.  Patient was admitted to this facility for short-term rehabilitation after the patient's recent hospitalization.  Patient has completed SNF rehabilitation and therapy has cleared the patient for discharge.   PAST MEDICAL HISTORY:  Past Medical History:  Diagnosis Date  . Arthritis of knee    bilateral    . Chronic renal insufficiency   . Diabetes mellitus, type 2 ([place])   . Hypertension   . Osteopenia   . PAD (peripheral artery disease) ([place])   . Personal history of colonic polyps 3 years ago    colonoscopy      CURRENT MEDICATIONS: Reviewed  Patient's Medications  New Prescriptions   No medications on file  Previous Medications   ACETAMINOPHEN  (TYLENOL) 325 MG TABLET    Take 650 mg by mouth every 4 (four) hours as needed.   CALCIUM CARBONATE-VITAMIN D (CALTRATE 600+D) 600-400 MG-UNIT PER TABLET    Take 1 tablet by mouth 2 (two) times daily.     DIPHENHYDRAMINE (BENADRYL) 50 MG TABLET    Take 50 mg by mouth at bedtime as needed for itching or allergies.   DOCUSATE SODIUM (COLACE) 100 MG CAPSULE    Take 100 mg by mouth 2 (two) times daily.   HYDROMORPHONE (DILAUDID) 2 MG TABLET    Take 1 tablet (2 mg total) by mouth every 4 (four) hours as needed for severe pain.   LISINOPRIL-HYDROCHLOROTHIAZIDE (PRINZIDE,ZESTORETIC) 20-12.5 MG TABLET    TAKE ONE TABLET BY MOUTH ONCE DAILY   LOPERAMIDE (IMODIUM) 2 MG CAPSULE    Take 2 mg by mouth as needed for diarrhea or loose stools.   MULTIVITAMIN (THERAGRAN) PER TABLET    Take 1 tablet by mouth daily.     ONDANSETRON (ZOFRAN) 4 MG TABLET    Take 1 tablet (4 mg total) by mouth every 8 (eight) hours as needed for nausea or vomiting.   POLYETHYLENE GLYCOL (MIRALAX / GLYCOLAX) PACKET    Take 17 g by mouth daily as needed.   RIVAROXABAN (XARELTO) 15 MG TABS TABLET    Take 15 mg by mouth 2 (two) times daily with a meal. 15  mg - 1 tab BID x21 days, then 20 mg qd  Modified Medications   No medications on file  Discontinued Medications   ASPIRIN EC 325 MG TABLET    Take 1 tablet (325 mg total) by mouth daily.   ENOXAPARIN (LOVENOX) 80 MG/0.8ML INJECTION    Inject 77 mg into the skin every 12 (twelve) hours.     Allergies  Allergen Reactions  . Codeine Hives  . Fosamax [Alendronate Sodium] Other (See Comments)    Pt reports excessive urination on the day she took the pill     REVIEW OF SYSTEMS:  GENERAL: no change in appetite, no fatigue, no weight changes, no fever, chills or weakness EYES: Denies change in vision, dry eyes, eye pain, itching or discharge EARS: Denies change in hearing, ringing in ears, or earache NOSE: Denies nasal congestion or epistaxis MOUTH and THROAT: Denies oral  discomfort, gingival pain or bleeding, pain from teeth or hoarseness   RESPIRATORY: no cough, SOB, DOE, wheezing, hemoptysis CARDIAC: no chest pain, edema or palpitations GI: no abdominal pain, diarrhea, constipation, heart burn, nausea or vomiting GU: Denies dysuria, frequency, hematuria, incontinence, or discharge PSYCHIATRIC: Denies feeling of depression or anxiety. No report of hallucinations, insomnia, paranoia, or agitation   PHYSICAL EXAMINATION  GENERAL APPEARANCE: Well nourished. In no acute distress. Obese SKIN:  Right knee surgical incision is healed HEAD: Normal in size and contour. No evidence of trauma EYES: Lids open and close normally. No blepharitis, entropion or ectropion. PERRL. Conjunctivae are clear and sclerae are white. Lenses are without opacity EARS: Pinnae are normal. Patient hears normal voice tunes of the examiner MOUTH and THROAT: Lips are without lesions. Oral mucosa is moist and without lesions. Tongue is normal in shape, size, and color and without lesions NECK: supple, trachea midline, no neck masses, no thyroid tenderness, no thyromegaly LYMPHATICS: no LAN in the neck, no supraclavicular LAN RESPIRATORY: breathing is even & unlabored, BS CTAB CARDIAC: RRR, no murmur,no extra heart sounds, BLE 2+ edema GI: abdomen soft, normal BS, no masses, no tenderness, no hepatomegaly, no splenomegaly EXTREMITIES:  Able to move X 4 extremities PSYCHIATRIC: Alert and oriented X 3. Affect and behavior are appropriate  LABS/RADIOLOGY: Labs reviewed: Basic Metabolic Panel:  Recent Labs  04/30/16 1308 05/13/16 0510 05/14/16 0403 05/18/16 1400  NA 141 139 142 143  K 3.7 4.5 4.9 4.2  CL 106 109 112*  --   CO2 26 25 25   --   GLUCOSE 101* 102* 112*  --   BUN 31* 25* 26* 19  CREATININE 1.37* 1.41* 1.38* 1.1  CALCIUM 9.3 8.3* 8.6*  --    Liver Function Tests:  Recent Labs  11/25/15 1135 03/18/16 1235 05/18/16 1400  AST 33 31 28  ALT 15 13 8   ALKPHOS 69 64  53  BILITOT 0.6 0.6  --   PROT 6.7 6.3  --   ALBUMIN 3.8 3.3*  --    CBC:  Recent Labs  05/13/16 0510 05/14/16 0403 05/14/16 0852 05/18/16 1400 05/27/16 1152  WBC 7.4 10.7* 10.0 4.5 4.1  NEUTROABS  --   --  8.8* 3 2  HGB 8.4* 8.5* 9.2* 8.3* 8.1*  HCT 26.4* 26.6* 28.4* 25* 26*  MCV 95.3 94.3 94.0  --   --   PLT 174 171 185 202 314    CBG:  Recent Labs  05/12/16 0932 05/12/16 1254 05/12/16 1653  GLUCAP 95 118* 121*       ASSESSMENT/PLAN:  Unsteady gait -  for Home health PT, OT, CNA and Nursing, for therapeutic strengthening exercises; fall precaution  Right knee osteoarthritis S/P right total knee arthroplasty - for Home health PT, OT, CNA and Nursing, for therapeutic strengthening exercises  ; Dilaudid 2 mg 1 tab by mouth every 4 hours when necessary and Acetaminophen 325 mg 2 tabs = 650 mg Q 4 hours PRN for pain; follow-up with Dr. Kathryne Hitch (orthopedic surgeon)  Hypertension - well-controlled;  continue lisinopril-hydrochlorothiazide 20-12.5 mg 1 tab by mouth daily  Anemia, acute blood loss - start FeSO4 325 mg PO daily; check CBC in 1 week Lab Results  Component Value Date   HGB 8.1 (A) 05/27/2016   Diabetes mellitus, type II - diet controlled Lab Results  Component Value Date   HGBA1C 6.0 (H) 03/18/2016   Chronic renal insufficiency, stage 3 - stable Lab Results  Component Value Date   CREATININE 1.1 05/18/2016   DVT on RLE - continue Xarelto 15 mg PO BID till 06/28/16 the start Xarelto 20 mg daily starting 06/29/16  Slow transit constipation - continue Colace 100 mg PO BID     I have filled out patient's discharge paperwork and written prescriptions.  Patient will receive home health PT, OT, Nursing and CNA.  DME provided:  None  Total discharge time: Greater than 30 minutes Greater than 50% was spent in counseling and coordination of care with the patient.   Discharge time involved coordination of the discharge process with social  worker, nursing staff and therapy department. Medical justification for home health services verified.  ** Patient is appealing to the insurance to extend her stay @ [facility] at this time.     Durenda Age, NP [employer] [phone]

## 2016-06-21 ENCOUNTER — Telehealth: Payer: Self-pay | Admitting: Family Medicine

## 2016-06-21 NOTE — Telephone Encounter (Signed)
Pt discharged from rehab facility ([facility]?) in [place] on Saturday. Has questions about meds she needs to stay on, also states she needs CBC per d/i  Pls send for her discharge summary if not in house, then we take it from there. She wont be able to come in for another 4 to 6 weeks, has in home PT/oT etc I advised her to call tomorrow pm if she had not heard from [place]

## 2016-07-02 NOTE — Telephone Encounter (Signed)
Patient scheduled for Monday.

## 2016-07-05 ENCOUNTER — Encounter: Payer: Medicare Other | Admitting: Family Medicine

## 2016-08-18 ENCOUNTER — Telehealth: Payer: Self-pay | Admitting: Family Medicine

## 2016-08-18 DIAGNOSIS — E119 Type 2 diabetes mellitus without complications: Secondary | ICD-10-CM

## 2016-08-18 DIAGNOSIS — N183 Chronic kidney disease, stage 3 unspecified: Secondary | ICD-10-CM

## 2016-08-18 NOTE — Telephone Encounter (Signed)
[name] is calling stating that she had a total knee surgery and she is needing an antibiotic to have your teeth cleaned, please advise? Dental Appt Needs to be scheduled

## 2016-08-18 NOTE — Telephone Encounter (Signed)
Do we prescribe that for this reason?

## 2016-08-18 NOTE — Telephone Encounter (Signed)
I have reviewed her record, despite the artificial joint , no antibiotic recommended prior to her detal work She needs to come by for flu vaccine , andllaso needs fasting ;labs   I will send staff message re labs needed, pls order and advise her , thanks

## 2016-08-27 NOTE — Telephone Encounter (Signed)
-----   Message from Fayrene Helper, MD sent at 08/18/2016 12:05 PM EDT ----- Regarding: labs needed please Fasting lipid, cmp, hBA1C cBC, iron and ferritin, thanks

## 2016-08-27 NOTE — Telephone Encounter (Signed)
abordered

## 2016-09-17 ENCOUNTER — Encounter: Payer: Self-pay | Admitting: Family Medicine

## 2016-10-04 ENCOUNTER — Encounter (HOSPITAL_COMMUNITY): Payer: Self-pay

## 2016-10-04 ENCOUNTER — Emergency Department (HOSPITAL_COMMUNITY)
Admission: EM | Admit: 2016-10-04 | Discharge: 2016-10-04 | Disposition: A | Discharge status: Home / Self Care | Payer: Medicare Other | Attending: Emergency Medicine | Admitting: Emergency Medicine

## 2016-10-04 ENCOUNTER — Emergency Department (HOSPITAL_COMMUNITY): Payer: Medicare Other

## 2016-10-04 DIAGNOSIS — W010XXA Fall on same level from slipping, tripping and stumbling without subsequent striking against object, initial encounter: Secondary | ICD-10-CM | POA: Diagnosis not present

## 2016-10-04 DIAGNOSIS — Z7901 Long term (current) use of anticoagulants: Secondary | ICD-10-CM | POA: Diagnosis not present

## 2016-10-04 DIAGNOSIS — Y92002 Bathroom of unspecified non-institutional (private) residence single-family (private) house as the place of occurrence of the external cause: Secondary | ICD-10-CM | POA: Insufficient documentation

## 2016-10-04 DIAGNOSIS — Z79899 Other long term (current) drug therapy: Secondary | ICD-10-CM | POA: Insufficient documentation

## 2016-10-04 DIAGNOSIS — Z87891 Personal history of nicotine dependence: Secondary | ICD-10-CM | POA: Diagnosis not present

## 2016-10-04 DIAGNOSIS — Y999 Unspecified external cause status: Secondary | ICD-10-CM | POA: Diagnosis not present

## 2016-10-04 DIAGNOSIS — R52 Pain, unspecified: Secondary | ICD-10-CM

## 2016-10-04 DIAGNOSIS — Z96653 Presence of artificial knee joint, bilateral: Secondary | ICD-10-CM | POA: Diagnosis not present

## 2016-10-04 DIAGNOSIS — I129 Hypertensive chronic kidney disease with stage 1 through stage 4 chronic kidney disease, or unspecified chronic kidney disease: Secondary | ICD-10-CM | POA: Insufficient documentation

## 2016-10-04 DIAGNOSIS — Y939 Activity, unspecified: Secondary | ICD-10-CM | POA: Insufficient documentation

## 2016-10-04 DIAGNOSIS — M25562 Pain in left knee: Secondary | ICD-10-CM | POA: Insufficient documentation

## 2016-10-04 DIAGNOSIS — N183 Chronic kidney disease, stage 3 (moderate): Secondary | ICD-10-CM | POA: Diagnosis not present

## 2016-10-04 DIAGNOSIS — E1122 Type 2 diabetes mellitus with diabetic chronic kidney disease: Secondary | ICD-10-CM | POA: Insufficient documentation

## 2016-10-04 DIAGNOSIS — W19XXXA Unspecified fall, initial encounter: Secondary | ICD-10-CM

## 2016-10-04 NOTE — ED Provider Notes (Signed)
[facility] DEPT Provider Note   CSN: [id number] Arrival date & time: 10/04/16  1925     History   Chief Complaint Chief Complaint  Patient presents with  . Fall    HPI [name] is a 82 y.o. female.  The history is provided by the patient and medical records. No language interpreter was used.     This is an 82 year old female who presents today with a fall that occurred a meal prior to arrival. Patient states that her right knee buckled and she fell landing on her left knee. She denies hitting her head. She is not on any anticoagulation. Pain in her left knee is mild and she has abrasions on her left knee. States she's been able to walk with her cane and walker since then. Denies any numbness or tingling in her distal left leg. States she is concerned that she does have a total knee arthroplasty in that left leg and she wanted to make sure everything was okay with that. No worsening or alleviating factors.  Past Medical History:  Diagnosis Date  . Arthritis of knee    bilateral    . Chronic renal insufficiency   . Diabetes mellitus, type 2 ([place])   . Hypertension   . Osteopenia   . PAD (peripheral artery disease) ([place])   . Personal history of colonic polyps 3 years ago    colonoscopy     Patient Active Problem List   Diagnosis Date Noted  . Primary localized osteoarthritis of right knee 05/12/2016  . Loss of weight 03/18/2016  . Urinary frequency 03/18/2016  . Annual physical exam 11/25/2015  . CKD (chronic kidney disease) stage 3, GFR 30-59 ml/min 01/15/2015  . Osteoarthritis of right knee 05/04/2014  . At high risk for falls 05/04/2014  . PVD (peripheral vascular disease) ([place]) 06/08/2011  . ABNORMAL ELECTROCARDIOGRAM 09/19/2009  . Type 2 diabetes, diet controlled ([place]) 12/04/2007  . Essential hypertension 10/14/2006  . OSTEOARTHRITIS 10/14/2006  . OSTEOPENIA 10/14/2006  . COLONIC POLYPS, HX OF 10/14/2006    Past Surgical History:  Procedure  Laterality Date  . bunionectomy bilateral  feet    . DENTAL SURGERY    . gum disease  2010   treated by Dr. Andree Elk   . Left knee manipulation  under anasthesia due to contracture -  03/08/08   Dr. Aline Brochure   . left knee replacement  08/14/08   Dr. Aline Brochure   . TOTAL ABDOMINAL HYSTERECTOMY     bleeding   . TOTAL KNEE ARTHROPLASTY Right 05/12/2016   Procedure: TOTAL KNEE ARTHROPLASTY;  Surgeon: [name], MD;  Location: [facility];  Service: Orthopedics;  Laterality: Right;  . WISDOM TOOTH EXTRACTION      OB History    No data available       Home Medications    Prior to Admission medications   Medication Sig Start Date End Date Taking? Authorizing Provider  acetaminophen (TYLENOL) 325 MG tablet Take 650 mg by mouth every 4 (four) hours as needed.    Historical Provider, MD  Calcium Carbonate-Vitamin D (CALTRATE 600+D) 600-400 MG-UNIT per tablet Take 1 tablet by mouth 2 (two) times daily.      Historical Provider, MD  diphenhydrAMINE (BENADRYL) 50 MG tablet Take 50 mg by mouth at bedtime as needed for itching or allergies.    Historical Provider, MD  docusate sodium (COLACE) 100 MG capsule Take 100 mg by mouth 2 (two) times daily.    Historical Provider, MD  HYDROmorphone (DILAUDID) 2 MG tablet Take 1 tablet (2 mg total) by mouth every 4 (four) hours as needed for severe pain. 05/17/16   Lauree Chandler, NP  lisinopril-hydrochlorothiazide (PRINZIDE,ZESTORETIC) 20-12.5 MG tablet TAKE ONE TABLET BY MOUTH ONCE DAILY 04/27/16   Fayrene Helper, MD  loperamide (IMODIUM) 2 MG capsule Take 2 mg by mouth as needed for diarrhea or loose stools.    Historical Provider, MD  multivitamin [facility]) per tablet Take 1 tablet by mouth daily.      Historical Provider, MD  ondansetron (ZOFRAN) 4 MG tablet Take 1 tablet (4 mg total) by mouth every 8 (eight) hours as needed for nausea or vomiting. 05/12/16   Aundra Dubin, PA-C  polyethylene glycol (MIRALAX / GLYCOLAX) packet Take 17 g by mouth  daily as needed.    Historical Provider, MD  Rivaroxaban (XARELTO) 15 MG TABS tablet Take 15 mg by mouth 2 (two) times daily with a meal. 15 mg - 1 tab BID x21 days, then 20 mg qd    Historical Provider, MD    Family History Family History  Problem Relation Age of Onset  . Colon cancer Father   . Cancer Mother   . Stroke Mother   . Hypertension Brother     Social History Social History  Substance Use Topics  . Smoking status: Former Research scientist (life sciences)  . Smokeless tobacco: Former Systems developer     Comment: smoked for 15 years-not smoked since 1998  . Alcohol use No     Allergies   Codeine and Fosamax [alendronate sodium]   Review of Systems Review of Systems  HENT: Negative for facial swelling.   Respiratory: Negative for shortness of breath.   Gastrointestinal: Negative for abdominal distention, abdominal pain, nausea and vomiting.  Musculoskeletal: Positive for arthralgias (Mild, L knee) and gait problem (at baseline uses cane/walker. No worsening.). Negative for joint swelling.  Skin: Positive for wound (abrasion on L knee from landing).  Neurological: Negative for syncope, speech difficulty, weakness, light-headedness and numbness.  Psychiatric/Behavioral: Negative for agitation and confusion.  All other systems reviewed and are negative.    Physical Exam Updated Vital Signs BP 137/69 (BP Location: Right Arm)   Pulse 92   Temp 97.7 F (36.5 C) (Oral)   Resp 18   Ht 5\' 6"  (1.676 m)   Wt 83 kg   SpO2 98%   BMI 29.54 kg/m   Physical Exam  Constitutional: No distress.  HENT:  Head: Normocephalic and atraumatic.  Eyes: Conjunctivae and EOM are normal.  Neck: Normal range of motion. Neck supple.  Cardiovascular: Normal rate and regular rhythm.   Pulmonary/Chest: Effort normal and breath sounds normal. No respiratory distress.  Abdominal: Soft. She exhibits no distension. There is no tenderness.  Musculoskeletal: She exhibits tenderness (mild ttp over anterior L knee). She  exhibits no edema or deformity.       Left knee: She exhibits normal range of motion and no swelling.  Skin: Skin is warm and dry. She is not diaphoretic.  Small abrasion to anterior L knee  Nursing note and vitals reviewed.    ED Treatments / Results  Labs (all labs ordered are listed, but only abnormal results are displayed) Labs Reviewed - No data to display  EKG  EKG Interpretation None       Radiology Dg Knee Complete 4 Views Left  Result Date: 10/04/2016 CLINICAL DATA:  Fall with pain and swelling EXAM: LEFT KNEE - COMPLETE 4+ VIEW COMPARISON:  08/15/2007 FINDINGS: Status  post left knee replacement with intact hardware. No acute displaced fracture. Dystrophic soft tissue calcifications in the distal anterior thigh. IMPRESSION: Status post left knee replacement.  No acute osseous abnormality Electronically Signed   By: Donavan Foil M.D.   On: 10/04/2016 21:35    Procedures Procedures (including critical care time)  Medications Ordered in ED Medications - No data to display   Initial Impression / Assessment and Plan / ED Course  I have reviewed the triage vital signs and the nursing notes.  Pertinent labs & imaging results that were available during my care of the patient were reviewed by me and considered in my medical decision making (see chart for details).  Clinical Course       Patient is an 82 year old female who presents today status post fall landing on her left knee. She states that this was a mechanical fall and that her knee buckled whenever she fell. There was some decreased strength on the left leg noted today on exam, however daughter who is present at the bedside states that this is old and not anything acute.   Reviewed the plain films independently and do not appreciate any evidence of acute fracture including no evidence of periprosthetic fracture. No evidence of acute injury noted on review with the radiology report.  The sinus are discussed  with the patient here is understanding and agreeable with discharge home at this time in the care of her family.  Final Clinical Impressions(s) / ED Diagnoses   Final diagnoses:  Fall, initial encounter  Acute pain of left knee    New Prescriptions Discharge Medication List as of 10/04/2016  9:52 PM       Theodosia Quay, MD 10/05/16 [id number]    Gareth Morgan, MD 10/07/16 1528

## 2016-10-04 NOTE — ED Triage Notes (Signed)
Pt from home post fall. Pt was leaving the bathroom and slipped and fell on the right knee. Pt wants the knee checked out due to having it replaced back in July. Pt is in no pain currently

## 2016-10-20 ENCOUNTER — Encounter: Payer: Medicare Other | Admitting: Family Medicine

## 2016-11-08 ENCOUNTER — Encounter: Payer: Self-pay | Admitting: Family Medicine

## 2016-11-08 ENCOUNTER — Ambulatory Visit (INDEPENDENT_AMBULATORY_CARE_PROVIDER_SITE_OTHER): Payer: Medicare Other | Admitting: Family Medicine

## 2016-11-08 VITALS — BP 138/78 | HR 85 | Resp 16 | Ht 66.0 in | Wt 166.0 lb

## 2016-11-08 DIAGNOSIS — Z1211 Encounter for screening for malignant neoplasm of colon: Secondary | ICD-10-CM | POA: Diagnosis not present

## 2016-11-08 DIAGNOSIS — Z23 Encounter for immunization: Secondary | ICD-10-CM

## 2016-11-08 DIAGNOSIS — Z0000 Encounter for general adult medical examination without abnormal findings: Secondary | ICD-10-CM | POA: Diagnosis not present

## 2016-11-08 DIAGNOSIS — E119 Type 2 diabetes mellitus without complications: Secondary | ICD-10-CM

## 2016-11-08 DIAGNOSIS — I10 Essential (primary) hypertension: Secondary | ICD-10-CM

## 2016-11-08 DIAGNOSIS — M899 Disorder of bone, unspecified: Secondary | ICD-10-CM

## 2016-11-08 DIAGNOSIS — G8929 Other chronic pain: Secondary | ICD-10-CM

## 2016-11-08 DIAGNOSIS — D649 Anemia, unspecified: Secondary | ICD-10-CM

## 2016-11-08 DIAGNOSIS — M25562 Pain in left knee: Secondary | ICD-10-CM | POA: Insufficient documentation

## 2016-11-08 DIAGNOSIS — M949 Disorder of cartilage, unspecified: Secondary | ICD-10-CM

## 2016-11-08 LAB — POC HEMOCCULT BLD/STL (OFFICE/1-CARD/DIAGNOSTIC): Fecal Occult Blood, POC: NEGATIVE

## 2016-11-08 NOTE — Patient Instructions (Signed)
Wellness past due pls sched in 4 to 6 weeks  Labs today as previously ordered pls add Vit D  MD f/u in 5.5 month  Flu vaccine today  You are referred to Dr Percell Miller re left knee pain and buckling  Thank you  for choosing [facility]. We consider it a privelige to serve you.  Delivering excellent health care in a caring and  compassionate way is our goal.  Partnering with you,  so that together we can achieve this goal is our strategy.

## 2016-11-08 NOTE — Assessment & Plan Note (Signed)
Increased left knee pain and instability, buckled this am, refer ortho

## 2016-11-08 NOTE — Progress Notes (Signed)
    [name]     MRN: [id number]      DOB: Aug 21, 1934  HPI: Patient is in for annual physical exam. C/o increased left knee pain and instability, buckled this morning, wants to return for ortho re eval, replaced 8 years ago Immunization is reviewed , and  updated    PE: Pleasant  female, alert and oriented x 3, in no cardio-pulmonary distress. Afebrile. HEENT No facial trauma or asymetry. Sinuses non tender.  Extra occullar muscles intact, Oropharynx moist, no exudate. Neck: decreased ROM, no adenopathy,JVD or thyromegaly.No bruits.  Chest: Clear to ascultation bilaterally.No crackles or wheezes. Non tender to palpation  Breast: No asymetry,no masses or lumps. No tenderness. No nipple discharge or inversion. No axillary or supraclavicular adenopathy  Cardiovascular system; Heart sounds normal,  S1 and  S2 ,no S3.  No murmur, or thrill. Apical beat not displaced Peripheral pulses normal.  Abdomen: Soft, non tender, no organomegaly or masses. No bruits. Bowel sounds normal. No guarding, tenderness or rebound.  Rectal:  Normal sphincter tone. No rectal mass. Guaiac negative stool.  GU:Not examined, denies mass, discharge, pelvic discomfort. Very difficult exam to put pt in stirrups , therefore since asymptomatic and based on age, no pelvic exam done Musculoskeletal exam: Markedly decreased  ROM of spine, hips , shoulders and knees.  deformity and swelling  Noted.in left knee No muscle wasting or atrophy.   Neurologic: Cranial nerves 2 to 12 intact. Power, tone ,sensation  normal throughout. Abnormal  gait.  Relies on assistive device for mobility.No tremor.  Skin: Intact, no ulceration, erythema , scaling or rash noted. Pigmentation normal throughout  Psych; Normal mood and affect. Judgement and concentration normal   Assessment & Plan:  Annual physical exam Annual exam as documented.  Immunization and cancer screening needs are specifically  addressed at this visit.   Left knee pain Increased left knee pain and instability, buckled this am, refer ortho

## 2016-11-08 NOTE — Assessment & Plan Note (Signed)
Annual exam as documented. . Immunization and cancer screening needs are specifically addressed at this visit.  

## 2016-11-10 LAB — COMPREHENSIVE METABOLIC PANEL
ALK PHOS: 69 U/L (ref 33–130)
ALT: 10 U/L (ref 6–29)
AST: 34 U/L (ref 10–35)
Albumin: 3.3 g/dL — ABNORMAL LOW (ref 3.6–5.1)
BILIRUBIN TOTAL: 0.8 mg/dL (ref 0.2–1.2)
BUN: 26 mg/dL — ABNORMAL HIGH (ref 7–25)
CALCIUM: 9.3 mg/dL (ref 8.6–10.4)
CO2: 28 mmol/L (ref 20–31)
Chloride: 108 mmol/L (ref 98–110)
Creat: 1.16 mg/dL — ABNORMAL HIGH (ref 0.60–0.88)
Glucose, Bld: 103 mg/dL — ABNORMAL HIGH (ref 65–99)
POTASSIUM: 4.2 mmol/L (ref 3.5–5.3)
Sodium: 143 mmol/L (ref 135–146)
Total Protein: 6.3 g/dL (ref 6.1–8.1)

## 2016-11-10 LAB — CBC
HEMATOCRIT: 32.3 % — AB (ref 35.0–45.0)
HEMOGLOBIN: 10.3 g/dL — AB (ref 11.7–15.5)
MCH: 28.6 pg (ref 27.0–33.0)
MCHC: 31.9 g/dL — AB (ref 32.0–36.0)
MCV: 89.7 fL (ref 80.0–100.0)
MPV: 10.7 fL (ref 7.5–12.5)
Platelets: 250 10*3/uL (ref 140–400)
RBC: 3.6 MIL/uL — AB (ref 3.80–5.10)
RDW: 18.5 % — ABNORMAL HIGH (ref 11.0–15.0)
WBC: 3.3 10*3/uL — ABNORMAL LOW (ref 3.8–10.8)

## 2016-11-10 LAB — LIPID PANEL
CHOL/HDL RATIO: 2.4 ratio (ref <5.0)
Cholesterol: 161 mg/dL (ref <200)
HDL: 66 mg/dL (ref >50)
LDL CALC: 85 mg/dL (ref <100)
TRIGLYCERIDES: 50 mg/dL (ref <150)
VLDL: 10 mg/dL (ref <30)

## 2016-11-10 LAB — IRON: Iron: 56 ug/dL (ref 45–160)

## 2016-11-10 LAB — FERRITIN: FERRITIN: 22 ng/mL (ref 20–288)

## 2016-11-11 LAB — HEMOGLOBIN A1C
Hgb A1c MFr Bld: 5.7 % — ABNORMAL HIGH (ref <5.7)
Mean Plasma Glucose: 117 mg/dL

## 2016-11-11 LAB — VITAMIN D 25 HYDROXY (VIT D DEFICIENCY, FRACTURES): VIT D 25 HYDROXY: 35 ng/mL (ref 30–100)

## 2016-11-17 ENCOUNTER — Telehealth: Payer: Self-pay

## 2016-11-18 NOTE — Telephone Encounter (Signed)
Patient aware.

## 2016-11-18 NOTE — Telephone Encounter (Signed)
She just had a negative FOB in the office this month, and has had this every year, so I advise her that there is no to feel an urgent need to have a colonoscopy now, will discuss further at [place] unless she has more concerns

## 2016-11-22 ENCOUNTER — Telehealth: Payer: Self-pay | Admitting: Gastroenterology

## 2016-11-22 NOTE — Telephone Encounter (Signed)
Recall for tcs °

## 2016-11-23 NOTE — Telephone Encounter (Signed)
Letter mailed to pt.  

## 2016-11-29 ENCOUNTER — Telehealth: Payer: Self-pay | Admitting: Family Medicine

## 2016-11-29 NOTE — Telephone Encounter (Signed)
[name] is c/o dark stools now, she is asking what should she do in this case, please advise?

## 2016-11-30 NOTE — Telephone Encounter (Signed)
Patient is on iron supplements.  She will continue to monitor stools and will call office with any problems.

## 2016-12-08 ENCOUNTER — Encounter (INDEPENDENT_AMBULATORY_CARE_PROVIDER_SITE_OTHER): Payer: Self-pay | Admitting: *Deleted

## 2016-12-08 ENCOUNTER — Encounter (INDEPENDENT_AMBULATORY_CARE_PROVIDER_SITE_OTHER): Payer: Self-pay | Admitting: Internal Medicine

## 2016-12-08 ENCOUNTER — Other Ambulatory Visit (INDEPENDENT_AMBULATORY_CARE_PROVIDER_SITE_OTHER): Payer: Self-pay | Admitting: Internal Medicine

## 2016-12-08 ENCOUNTER — Ambulatory Visit (INDEPENDENT_AMBULATORY_CARE_PROVIDER_SITE_OTHER): Payer: Medicare Other | Admitting: Internal Medicine

## 2016-12-08 ENCOUNTER — Encounter (INDEPENDENT_AMBULATORY_CARE_PROVIDER_SITE_OTHER): Payer: Self-pay

## 2016-12-08 VITALS — BP 120/70 | HR 64 | Temp 97.6°F | Ht 66.0 in | Wt 168.8 lb

## 2016-12-08 DIAGNOSIS — D508 Other iron deficiency anemias: Secondary | ICD-10-CM

## 2016-12-08 DIAGNOSIS — K921 Melena: Secondary | ICD-10-CM | POA: Diagnosis not present

## 2016-12-08 NOTE — Progress Notes (Signed)
Subjective:    Patient ID: [name], female    DOB: 03-01-1934, 83 y.o.   MRN: [id number]  HPI Referred by Dr. Hinda Lenis for anemia.  She was last seen in June of 2017 for anemia. 03/18/2016 Hemoglobin was 10.8. She was guaiac positive at OV. Her last colonoscopy was in 2008 with left sided diverticulosis.  Family hx of colon cancer in father age 79. Total knee in July of 2017.  Scheduled for a colonoscopy but she cancelled due to have knee surgery.  Hx of CKD stage 3 She states her stools have been black  First saw black stool back in August. Has been occurring off and on. Has not seen any black stool since last week since she started the Iron (last week). There has been some weight loss since her surgery. Her appetite is good.  She usually has a BM daily. No change in her stools except for being black.  She denies any acid reflux or abdominal pain.  No NSAIDS except for baby ASA   11/11/2016 Iron 56, ferritin 22  11/15/2016 TIBC 256, UIBC 308, Iron 48, Iron sat 13, ferritin 38 Hemoglobin 11.2,    11/08/2016 Fecal occult blood negative.     CBC    Component Value Date/Time   WBC 3.3 (L) 11/08/2016 0912   RBC 3.60 (L) 11/08/2016 0912   HGB 10.3 (L) 11/08/2016 0912   HCT 32.3 (L) 11/08/2016 0912   PLT 250 11/08/2016 0912   MCV 89.7 11/08/2016 0912   MCH 28.6 11/08/2016 0912   MCHC 31.9 (L) 11/08/2016 0912   RDW 18.5 (H) 11/08/2016 0912   LYMPHSABS 0.7 05/14/2016 0852   MONOABS 0.4 05/14/2016 0852   EOSABS 0.1 05/14/2016 0852   BASOSABS 0.0 05/14/2016 0852     Review of Systems Past Medical History:  Diagnosis Date  . Arthritis of knee    bilateral    . Chronic renal insufficiency   . Diabetes mellitus, type 2 ([place])   . Hypertension   . Osteopenia   . PAD (peripheral artery disease) ([place])   . Personal history of colonic polyps 3 years ago    colonoscopy     Past Surgical History:  Procedure Laterality Date  . bunionectomy bilateral  feet    . DENTAL  SURGERY    . gum disease  2010   treated by Dr. Andree Elk   . Left knee manipulation  under anasthesia due to contracture -  03/08/08   Dr. Aline Brochure   . left knee replacement  08/14/08   Dr. Aline Brochure   . TOTAL ABDOMINAL HYSTERECTOMY     bleeding   . TOTAL KNEE ARTHROPLASTY Right 05/12/2016   Procedure: TOTAL KNEE ARTHROPLASTY;  Surgeon: [name], MD;  Location: [facility];  Service: Orthopedics;  Laterality: Right;  . WISDOM TOOTH EXTRACTION      Allergies  Allergen Reactions  . Codeine Hives  . Fosamax [Alendronate Sodium] Other (See Comments)    Pt reports excessive urination on the day she took the pill    Current Outpatient Prescriptions on File Prior to Visit  Medication Sig Dispense Refill  . acetaminophen (TYLENOL) 325 MG tablet Take 650 mg by mouth every 4 (four) hours as needed.    . Calcium Carbonate-Vitamin D (CALTRATE 600+D) 600-400 MG-UNIT per tablet Take 1 tablet by mouth 2 (two) times daily.      Marland Kitchen lisinopril-hydrochlorothiazide (PRINZIDE,ZESTORETIC) 20-12.5 MG tablet TAKE ONE TABLET BY MOUTH ONCE DAILY 90 tablet 0  .  multivitamin (THERAGRAN) per tablet Take 1 tablet by mouth daily.      . polyethylene glycol (MIRALAX / GLYCOLAX) packet Take 17 g by mouth daily as needed.     No current facility-administered medications on file prior to visit.        Objective:   Physical Exam Blood pressure 120/70, pulse 64, temperature 97.6 F (36.4 C), height 5\' 6"  (1.676 m), weight 168 lb 12.8 oz (76.6 kg). Alert and oriented. Skin warm and dry. Oral mucosa is moist.   . Sclera anicteric, conjunctivae is pink. Thyroid not enlarged. No cervical lymphadenopathy. Lungs clear. Heart regular rate and rhythm.  Abdomen is soft. Bowel sounds are positive. No hepatomegaly. No abdominal masses felt. No tenderness.  No edema to lower extremities.        Assessment & Plan:  IDA. Family hx of colon cancer. Hx of polyps.  Needs colonoscopy.  Melena: PUD needs to be ruled out. EGD.

## 2016-12-08 NOTE — Patient Instructions (Signed)
EGD/Colonoscopy. The risks and benefits such as perforation, bleeding, and infection were reviewed with the patient and is agreeable. 

## 2016-12-09 ENCOUNTER — Ambulatory Visit (HOSPITAL_COMMUNITY)
Admission: RE | Admit: 2016-12-09 | Discharge: 2016-12-09 | Disposition: A | Discharge status: Home / Self Care | Payer: Medicare Other | Source: Ambulatory Visit | Attending: Cardiovascular Disease | Admitting: Cardiovascular Disease

## 2016-12-09 DIAGNOSIS — Z87891 Personal history of nicotine dependence: Secondary | ICD-10-CM | POA: Diagnosis not present

## 2016-12-09 DIAGNOSIS — D649 Anemia, unspecified: Secondary | ICD-10-CM | POA: Insufficient documentation

## 2016-12-09 DIAGNOSIS — I739 Peripheral vascular disease, unspecified: Secondary | ICD-10-CM | POA: Diagnosis not present

## 2016-12-09 DIAGNOSIS — I10 Essential (primary) hypertension: Secondary | ICD-10-CM | POA: Diagnosis not present

## 2016-12-09 DIAGNOSIS — E119 Type 2 diabetes mellitus without complications: Secondary | ICD-10-CM | POA: Insufficient documentation

## 2016-12-16 ENCOUNTER — Other Ambulatory Visit (HOSPITAL_COMMUNITY): Payer: Medicare Other

## 2016-12-16 ENCOUNTER — Ambulatory Visit (HOSPITAL_COMMUNITY): Payer: Medicare Other

## 2016-12-16 ENCOUNTER — Encounter (INDEPENDENT_AMBULATORY_CARE_PROVIDER_SITE_OTHER): Payer: Self-pay

## 2016-12-16 ENCOUNTER — Encounter (HOSPITAL_COMMUNITY)
Admission: RE | Disposition: A | Discharge status: Home / Self Care | Payer: Self-pay | Source: Ambulatory Visit | Attending: Internal Medicine

## 2016-12-16 ENCOUNTER — Ambulatory Visit (HOSPITAL_COMMUNITY)
Admission: RE | Admit: 2016-12-16 | Discharge: 2016-12-16 | Disposition: A | Discharge status: Home / Self Care | Payer: Medicare Other | Source: Ambulatory Visit | Attending: Internal Medicine | Admitting: Internal Medicine

## 2016-12-16 DIAGNOSIS — K449 Diaphragmatic hernia without obstruction or gangrene: Secondary | ICD-10-CM | POA: Insufficient documentation

## 2016-12-16 DIAGNOSIS — Z79899 Other long term (current) drug therapy: Secondary | ICD-10-CM | POA: Diagnosis not present

## 2016-12-16 DIAGNOSIS — R160 Hepatomegaly, not elsewhere classified: Secondary | ICD-10-CM

## 2016-12-16 DIAGNOSIS — N189 Chronic kidney disease, unspecified: Secondary | ICD-10-CM | POA: Insufficient documentation

## 2016-12-16 DIAGNOSIS — M170 Bilateral primary osteoarthritis of knee: Secondary | ICD-10-CM | POA: Insufficient documentation

## 2016-12-16 DIAGNOSIS — Z809 Family history of malignant neoplasm, unspecified: Secondary | ICD-10-CM | POA: Diagnosis not present

## 2016-12-16 DIAGNOSIS — Z800 Family history of malignant neoplasm of digestive organs: Secondary | ICD-10-CM | POA: Insufficient documentation

## 2016-12-16 DIAGNOSIS — K209 Esophagitis, unspecified: Secondary | ICD-10-CM | POA: Insufficient documentation

## 2016-12-16 DIAGNOSIS — Z8249 Family history of ischemic heart disease and other diseases of the circulatory system: Secondary | ICD-10-CM | POA: Diagnosis not present

## 2016-12-16 DIAGNOSIS — K317 Polyp of stomach and duodenum: Secondary | ICD-10-CM | POA: Diagnosis not present

## 2016-12-16 DIAGNOSIS — K921 Melena: Secondary | ICD-10-CM | POA: Diagnosis present

## 2016-12-16 DIAGNOSIS — Z87891 Personal history of nicotine dependence: Secondary | ICD-10-CM | POA: Insufficient documentation

## 2016-12-16 DIAGNOSIS — M8580 Other specified disorders of bone density and structure, unspecified site: Secondary | ICD-10-CM | POA: Insufficient documentation

## 2016-12-16 DIAGNOSIS — E1122 Type 2 diabetes mellitus with diabetic chronic kidney disease: Secondary | ICD-10-CM | POA: Insufficient documentation

## 2016-12-16 DIAGNOSIS — D508 Other iron deficiency anemias: Secondary | ICD-10-CM

## 2016-12-16 DIAGNOSIS — Z86010 Personal history of colonic polyps: Secondary | ICD-10-CM | POA: Insufficient documentation

## 2016-12-16 DIAGNOSIS — Z888 Allergy status to other drugs, medicaments and biological substances status: Secondary | ICD-10-CM | POA: Insufficient documentation

## 2016-12-16 DIAGNOSIS — Z7982 Long term (current) use of aspirin: Secondary | ICD-10-CM | POA: Diagnosis not present

## 2016-12-16 DIAGNOSIS — I129 Hypertensive chronic kidney disease with stage 1 through stage 4 chronic kidney disease, or unspecified chronic kidney disease: Secondary | ICD-10-CM | POA: Diagnosis not present

## 2016-12-16 DIAGNOSIS — I739 Peripheral vascular disease, unspecified: Secondary | ICD-10-CM | POA: Diagnosis not present

## 2016-12-16 DIAGNOSIS — K5730 Diverticulosis of large intestine without perforation or abscess without bleeding: Secondary | ICD-10-CM | POA: Diagnosis not present

## 2016-12-16 DIAGNOSIS — Z823 Family history of stroke: Secondary | ICD-10-CM | POA: Diagnosis not present

## 2016-12-16 DIAGNOSIS — D509 Iron deficiency anemia, unspecified: Secondary | ICD-10-CM | POA: Diagnosis not present

## 2016-12-16 DIAGNOSIS — Z90710 Acquired absence of both cervix and uterus: Secondary | ICD-10-CM | POA: Diagnosis not present

## 2016-12-16 DIAGNOSIS — Z96653 Presence of artificial knee joint, bilateral: Secondary | ICD-10-CM | POA: Diagnosis not present

## 2016-12-16 DIAGNOSIS — D649 Anemia, unspecified: Secondary | ICD-10-CM

## 2016-12-16 DIAGNOSIS — Z886 Allergy status to analgesic agent status: Secondary | ICD-10-CM | POA: Diagnosis not present

## 2016-12-16 HISTORY — PX: BIOPSY: SHX5522

## 2016-12-16 HISTORY — PX: ESOPHAGOGASTRODUODENOSCOPY: SHX5428

## 2016-12-16 HISTORY — PX: COLONOSCOPY: SHX5424

## 2016-12-16 SURGERY — COLONOSCOPY
Anesthesia: Moderate Sedation

## 2016-12-16 MED ORDER — MIDAZOLAM HCL 5 MG/5ML IJ SOLN
INTRAMUSCULAR | Status: DC | PRN
  Administered 2016-12-16 (×5): 1 mg via INTRAVENOUS

## 2016-12-16 MED ORDER — MEPERIDINE HCL 50 MG/ML IJ SOLN
INTRAMUSCULAR | Status: DC | PRN
  Administered 2016-12-16 (×2): 25 mg via INTRAVENOUS

## 2016-12-16 MED ORDER — STERILE WATER FOR IRRIGATION IR SOLN
Status: DC | PRN
  Administered 2016-12-16: 14:00:00

## 2016-12-16 MED ORDER — LIDOCAINE VISCOUS 2 % MT SOLN
OROMUCOSAL | Status: AC
  Filled 2016-12-16: qty 15

## 2016-12-16 MED ORDER — SODIUM CHLORIDE 0.9 % IV SOLN
INTRAVENOUS | Status: DC
  Administered 2016-12-16: 13:00:00 via INTRAVENOUS

## 2016-12-16 MED ORDER — MEPERIDINE HCL 50 MG/ML IJ SOLN
INTRAMUSCULAR | Status: AC
  Filled 2016-12-16: qty 1

## 2016-12-16 MED ORDER — FLINTSTONES PLUS IRON PO CHEW: 1.0000 | CHEWABLE_TABLET | Freq: Two times a day (BID) | ORAL | Status: AC

## 2016-12-16 MED ORDER — PANTOPRAZOLE SODIUM 40 MG PO TBEC: 40.0000 mg | DELAYED_RELEASE_TABLET | Freq: Every day | ORAL | 5 refills | Status: DC

## 2016-12-16 MED ORDER — LIDOCAINE VISCOUS 2 % MT SOLN
OROMUCOSAL | Status: DC | PRN
  Administered 2016-12-16: 3 mL via OROMUCOSAL

## 2016-12-16 MED ORDER — MIDAZOLAM HCL 5 MG/5ML IJ SOLN
INTRAMUSCULAR | Status: AC
  Filled 2016-12-16: qty 10

## 2016-12-16 NOTE — Discharge Instructions (Signed)
Resume usual medications including aspirin. [place] chewable iron 1 tablet by mouth daily. Pantoprazole 40 mg by mouth 30 minutes before breakfast daily. No driving for 24 hours. Abdominal ultrasound to be scheduled. Physician will call with biopsy results.     Esophagogastroduodenoscopy, Care After Refer to this sheet in the next few weeks. These instructions provide you with information about caring for yourself after your procedure. Your health care provider may also give you more specific instructions. Your treatment has been planned according to current medical practices, but problems sometimes occur. Call your health care provider if you have any problems or questions after your procedure. What can I expect after the procedure? After the procedure, it is common to have:  A sore throat.  Nausea.  Bloating.  Dizziness.  Fatigue. Follow these instructions at home:  Do not eat or drink anything until the numbing medicine (local anesthetic) has worn off and your gag reflex has returned. You will know that the local anesthetic has worn off when you can swallow comfortably.  Do not drive for 24 hours if you received a medicine to help you relax (sedative).  If your health care provider took a tissue sample for testing during the procedure, make sure to get your test results. This is your responsibility. Ask your health care provider or the department performing the test when your results will be ready.  Keep all follow-up visits as told by your health care provider. This is important. Contact a health care provider if:  You cannot stop coughing.  You are not urinating.  You are urinating less than usual. Get help right away if:  You have trouble swallowing.  You cannot eat or drink.  You have throat or chest pain that gets worse.  You are dizzy or light-headed.  You faint.  You have nausea or vomiting.  You have chills.  You have a fever.  You have severe  abdominal pain.  You have black, tarry, or bloody stools. This information is not intended to replace advice given to you by your health care provider. Make sure you discuss any questions you have with your health care provider. Document Released: 09/20/2012 Document Revised: 03/11/2016 Document Reviewed: 08/28/2015 Elsevier Interactive Patient Education  2017 [employer]    Colonoscopy, Adult, Care After This sheet gives you information about how to care for yourself after your procedure. Your doctor may also give you more specific instructions. If you have problems or questions, call your doctor. Follow these instructions at home: General instructions    For the first 24 hours after the procedure:  Do not drive or use machinery.  Do not sign important documents.  Do not drink alcohol.  Do your daily activities more slowly than normal.  Eat foods that are soft and easy to digest.  Rest often.  Take over-the-counter or prescription medicines only as told by your doctor.  It is up to you to get the results of your procedure. Ask your doctor, or the department performing the procedure, when your results will be ready. To help cramping and bloating:   Try walking around.  Put heat on your belly (abdomen) as told by your doctor. Use a heat source that your doctor recommends, such as a moist heat pack or a heating pad.  Put a towel between your skin and the heat source.  Leave the heat on for 20-30 minutes.  Remove the heat if your skin turns bright red. This is especially important if you cannot feel  pain, heat, or cold. You can get burned. Eating and drinking   Drink enough fluid to keep your pee (urine) clear or pale yellow.  Return to your normal diet as told by your doctor. Avoid heavy or fried foods that are hard to digest.  Avoid drinking alcohol for as long as told by your doctor. Contact a doctor if:  You have blood in your poop (stool) 2-3 days after the  procedure. Get help right away if:  You have more than a small amount of blood in your poop.  You see large clumps of tissue (blood clots) in your poop.  Your belly is swollen.  You feel sick to your stomach (nauseous).  You throw up (vomit).  You have a fever.  You have belly pain that gets worse, and medicine does not help your pain. This information is not intended to replace advice given to you by your health care provider. Make sure you discuss any questions you have with your health care provider. Document Released: 11/06/2010 Document Revised: 06/28/2016 Document Reviewed: 06/28/2016 Elsevier Interactive Patient Education  2017 [employer]    \ Hiatal Hernia A hiatal hernia occurs when part of the stomach slides above the muscle that separates the abdomen from the chest (diaphragm). A person can be born with a hiatal hernia (congenital), or it may develop over time. In almost all cases of hiatal hernia, only the top part of the stomach pushes through the diaphragm. Many people have a hiatal hernia with no symptoms. The larger the hernia, the more likely it is that you will have symptoms. In some cases, a hiatal hernia allows stomach acid to flow back into the tube that carries food from your mouth to your stomach (esophagus). This may cause heartburn symptoms. Severe heartburn symptoms may mean that you have developed a condition called gastroesophageal reflux disease (GERD). What are the causes? This condition is caused by a weakness in the opening (hiatus) where the esophagus passes through the diaphragm to attach to the upper part of the stomach. A person may be born with a weakness in the hiatus, or a weakness can develop over time. What increases the risk? This condition is more likely to develop in:  Older people. Age is a major risk factor for a hiatal hernia, especially if you are over the age of 22.  Pregnant women.  People who are overweight.  People who have  frequent constipation. What are the signs or symptoms? Symptoms of this condition usually develop in the form of GERD symptoms. Symptoms include:  Heartburn.  Belching.  Indigestion.  Trouble swallowing.  Coughing or wheezing.  Sore throat.  Hoarseness.  Chest pain.  Nausea and vomiting. How is this diagnosed? This condition may be diagnosed during testing for GERD. Tests that may be done include:  X-rays of your stomach or chest.  An upper gastrointestinal (GI) series. This is an X-ray exam of your GI tract that is taken after you swallow a chalky liquid that shows up clearly on the X-ray.  Endoscopy. This is a procedure to look into your stomach using a thin, flexible tube that has a tiny camera and light on the end of it. How is this treated? This condition may be treated by:  Dietary and lifestyle changes to help reduce GERD symptoms.  Medicines. These may include:  Over-the-counter antacids.  Medicines that make your stomach empty more quickly.  Medicines that block the production of stomach acid (H2 blockers).  Stronger medicines to  reduce stomach acid (proton pump inhibitors).  Surgery to repair the hernia, if other treatments are not helping. If you have no symptoms, you may not need treatment. Follow these instructions at home: Lifestyle and activity   Do not use any products that contain nicotine or tobacco, such as cigarettes and e-cigarettes. If you need help quitting, ask your health care provider.  Try to achieve and maintain a healthy body weight.  Avoid putting pressure on your abdomen. Anything that puts pressure on your abdomen increases the amount of acid that may be pushed up into your esophagus.  Avoid bending over, especially after eating.  Raise the head of your bed by putting blocks under the legs. This keeps your head and esophagus higher than your stomach.  Do not wear tight clothing around your chest or stomach.  Try not to  strain when having a bowel movement, when urinating, or when lifting heavy objects. Eating and drinking   Avoid foods that can worsen GERD symptoms. These may include:  Fatty foods, like fried foods.  Citrus fruits, like oranges or lemon.  Other foods and drinks that contain acid, like orange juice or tomatoes.  Spicy food.  Chocolate.  Eat frequent small meals instead of three large meals a day. This helps prevent your stomach from getting too full.  Eat slowly.  Do not lie down right after eating.  Do not eat 1-2 hours before bed.  Do not drink beverages with caffeine. These include cola, coffee, cocoa, and tea.  Do not drink alcohol. General instructions   Take over-the-counter and prescription medicines only as told by your health care provider.  Keep all follow-up visits as told by your health care provider. This is important. Contact a health care provider if:  Your symptoms are not controlled with medicines or lifestyle changes.  You are having trouble swallowing.  You have coughing or wheezing that will not go away. Get help right away if:  Your pain is getting worse.  Your pain spreads to your arms, neck, jaw, teeth, or back.  You have shortness of breath.  You sweat for no reason.  You feel sick to your stomach (nauseous) or you vomit.  You vomit blood.  You have bright red blood in your stools.  You have black, tarry stools. This information is not intended to replace advice given to you by your health care provider. Make sure you discuss any questions you have with your health care provider. Document Released: 12/25/2003 Document Revised: 09/27/2016 Document Reviewed: 09/27/2016 Elsevier Interactive Patient Education  2017 [employer].

## 2016-12-16 NOTE — Op Note (Signed)
[facility] Patient Name: [name] Procedure Date: 12/16/2016 1:00 PM MRN: [id number] Date of Birth: September 28, 1934 Attending MD: Hildred Laser , MD CSN: [id number] Age: 83 Admit Type: Outpatient Procedure:                Upper GI endoscopy Indications:              Melena Providers:                Hildred Laser, MD, Jeanann Lewandowsky. Sharon Seller, RN, Rosina Lowenstein, RN Referring MD:             Fran Lowes, MD Medicines:                viscous lidocaine for topical oropharyngeal                            anesthesia. Meperidine 50 mg IV, Midazolam 3 mg IV Complications:            No immediate complications. Estimated Blood Loss:     Estimated blood loss was minimal. Procedure:                Pre-Anesthesia Assessment:                           - Prior to the procedure, a History and Physical                            was performed, and patient medications and                            allergies were reviewed. The patient's tolerance of                            previous anesthesia was also reviewed. The risks                            and benefits of the procedure and the sedation                            options and risks were discussed with the patient.                            All questions were answered, and informed consent                            was obtained. Prior Anticoagulants: The patient                            last took aspirin 2 days prior to the procedure.                            ASA Grade Assessment: III - A patient with severe  systemic disease. After reviewing the risks and                            benefits, the patient was deemed in satisfactory                            condition to undergo the procedure.                           After obtaining informed consent, the endoscope was                            passed under direct vision. Throughout the                            procedure, the  patient's blood pressure, pulse, and                            oxygen saturations were monitored continuously. The                            [id number]) scope was introduced through the                            mouth, and advanced to the second part of duodenum.                            The upper GI endoscopy was accomplished without                            difficulty. The patient tolerated the procedure                            well. Scope In: 1:59:22 PM Scope Out: 2:06:16 PM Total Procedure Duration: 0 hours 6 minutes 54 seconds  Findings:      The examined esophagus was normal.      Non-severe esophagitis with bleeding was found 40 cm from the incisors.      A 2 cm hiatal hernia was present.      A single small sessile polyp with no stigmata of recent bleeding was       found in herniated aided part of the stomach.. Biopsies were taken with       a cold forceps for histology.      The exam of the stomach was otherwise normal.      The duodenal bulb and second portion of the duodenum were normal. Impression:               - Normal esophagus.                           - Non-severe esophagitis at GEJ with friable mucosa                            leeding on rubbing with scope.                           -  2 cm hiatal hernia.                           - A single gastric polyp. Biopsied. ip was located                            in herniated part of the stomach.                           - Normal duodenal bulb and second portion of the                            duodenum. Moderate Sedation:      Moderate (conscious) sedation was administered by the endoscopy nurse       and supervised by the endoscopist. The following parameters were       monitored: oxygen saturation, heart rate, blood pressure, CO2       capnography and response to care. Total physician intraservice time was       13 minutes. Recommendation:           - Patient has a contact number available for                             emergencies. The signs and symptoms of potential                            delayed complications were discussed with the                            patient. Return to normal activities tomorrow.                            Written discharge instructions were provided to the                            patient.                           - Resume previous diet today.                           - Continue present medications.                           - Pantoprazole 40 mg by mouth every mornin                           - Await pathology results. Procedure Code(s):        --- Professional ---                           [phone], Esophagogastroduodenoscopy, flexible,                            transoral; with biopsy, single or multiple  [id number], Moderate sedation services provided by the                            same physician or other qualified health care                            professional performing the diagnostic or                            therapeutic service that the sedation supports,                            requiring the presence of an independent trained                            observer to assist in the monitoring of the                            patient's level of consciousness and physiological                            status; initial 15 minutes of intraservice time,                            patient age 62 years or older Diagnosis Code(s):        --- Professional ---                           K20.9, Esophagitis, unspecified                           K44.9, Diaphragmatic hernia without obstruction or                            gangrene                           K31.7, Polyp of stomach and duodenum                           K92.1, Melena (includes Hematochezia) CPT copyright 2016 American Medical Association. All rights reserved. The codes documented in this report are preliminary and upon coder review may  be revised to meet  current compliance requirements. Hildred Laser, MD Hildred Laser, MD 12/16/2016 2:58:43 PM This report has been signed electronically. Number of Addenda: 0

## 2016-12-16 NOTE — H&P (Addendum)
[name] is an 83 y.o. female.   Chief Complaint: Patient is here for EGD and colonoscopy. HPI: She is 83 year old African-American female with multiple medical problems was noted to have heme-positive stool and iron deficiency anemia. She gives history of intermittent tarry stools. She denies epigastric pain nausea vomiting or heartburn. She also denies dysphagia or rectal bleeding. States she has lost 20 pounds and she had left knee replacement in July 2017. She also has not had a good appetite. Last colonoscopy was about 10 years ago. It was negative poorly she's had polyps on prior colonoscopy. She does not remember when. Family history is significant for CRC and father who was 53 at the time of diagnosis and died 7 years later.  Please note that patient fell as she was entering the hospital. As usual she was using her walker. She states left leg  gave out. She has fallen in the past. She did not experience loss of consciousness. She states she fell to the ground gradually and did not hit the floor heart. She does not have any pain. Past Medical History:  Diagnosis Date  . Arthritis of knee    bilateral    . Chronic renal insufficiency   . Diabetes mellitus, type 2 ([place])   . Hypertension   . Osteopenia   . PAD (peripheral artery disease) ([place])   . Personal history of colonic polyps 3 years ago    colonoscopy     Past Surgical History:  Procedure Laterality Date  . bunionectomy bilateral  feet    . DENTAL SURGERY    . gum disease  2010   treated by Dr. Andree Elk   . Left knee manipulation  under anasthesia due to contracture -  03/08/08   Dr. Aline Brochure   . left knee replacement  08/14/08   Dr. Aline Brochure   . TOTAL ABDOMINAL HYSTERECTOMY     bleeding   . TOTAL KNEE ARTHROPLASTY Right 05/12/2016   Procedure: TOTAL KNEE ARTHROPLASTY;  Surgeon: [name], MD;  Location: [place];  Service: Orthopedics;  Laterality: Right;  . WISDOM TOOTH EXTRACTION      Family History  Problem  Relation Age of Onset  . Colon cancer Father   . Cancer Mother   . Stroke Mother   . Hypertension Brother    Social History:  reports that she has quit smoking. She has quit using smokeless tobacco. She reports that she does not drink alcohol or use drugs.  Allergies:  Allergies  Allergen Reactions  . Codeine Hives  . Fosamax [Alendronate Sodium] Other (See Comments)    Pt reports excessive urination on the day she took the pill    Medications Prior to Admission  Medication Sig Dispense Refill  . acetaminophen (TYLENOL) 325 MG tablet Take 650 mg by mouth every 4 (four) hours as needed for mild pain.     Marland Kitchen aspirin EC 81 MG tablet Take 81 mg by mouth daily.    . Calcium Carbonate-Vitamin D (CALTRATE 600+D) 600-400 MG-UNIT per tablet Take 1 tablet by mouth 2 (two) times daily.      . diphenhydrAMINE (BENADRYL) 50 MG tablet Take 50 mg by mouth at bedtime as needed for itching.    Marland Kitchen lisinopril-hydrochlorothiazide (PRINZIDE,ZESTORETIC) 20-12.5 MG tablet TAKE ONE TABLET BY MOUTH ONCE DAILY 90 tablet 0  . multivitamin (THERAGRAN) per tablet Take 1 tablet by mouth daily.      . polyethylene glycol (MIRALAX / GLYCOLAX) packet Take 17 g by mouth daily  as needed (constipation).     . ferrous sulfate 325 (65 FE) MG EC tablet Take by mouth daily with breakfast.      No results found for this or any previous visit (from the past 48 hour(s)). No results found.  ROS  Blood pressure 137/89, pulse 84, temperature 98.1 F (36.7 C), temperature source Oral, resp. rate 18, SpO2 100 %. Physical Exam  Constitutional: She appears well-developed and well-nourished.  HENT:  Mouth/Throat: Oropharynx is clear and moist.  Eyes: Conjunctivae are normal. No scleral icterus.  Neck: No thyromegaly present.  Cardiovascular: Normal rate, regular rhythm and normal heart sounds.   No murmur heard. Respiratory: Effort normal and breath sounds normal.  GI:  Abdomen is full and epigastric region. On palpation it  is soft. Left lobe of liver is enlarged firm with irregular service  Musculoskeletal: She exhibits edema (bilateral leg edema left greater than right(1-2+)).  Lymphadenopathy:    She has no cervical adenopathy.  Neurological: She is alert.  Skin: Skin is warm and dry.     Assessment/Plan Melena and iron deficiency anemia. History of colonic polyps. Diagnostic EGD and colonoscopy. Abnormal abdominal exam with hepatomegaly involving left lobe of the liver.  Hildred Laser, MD 12/16/2016, 1:46 PM

## 2016-12-16 NOTE — Op Note (Signed)
[facility] Patient Name: [name] Procedure Date: 12/16/2016 2:09 PM MRN: [id number] Date of Birth: 18-Dec-1933 Attending MD: Hildred Laser , MD CSN: [id number] Age: 83 Admit Type: Outpatient Procedure:                Colonoscopy Indications:              Heme positive stool, Iron deficiency anemia Providers:                Hildred Laser, MD, Gwenlyn Fudge RN, RN, Rosina Lowenstein, RN Referring MD:             Fran Lowes, MD Medicines:                Midazolam 2 mg IV Complications:            No immediate complications. Estimated Blood Loss:     Estimated blood loss: none. Procedure:                Pre-Anesthesia Assessment:                           - Prior to the procedure, a History and Physical                            was performed, and patient medications and                            allergies were reviewed. The patient's tolerance of                            previous anesthesia was also reviewed. The risks                            and benefits of the procedure and the sedation                            options and risks were discussed with the patient.                            All questions were answered, and informed consent                            was obtained. Prior Anticoagulants: The patient                            last took aspirin 2 days prior to the procedure.                            ASA Grade Assessment: III - A patient with severe                            systemic disease. After reviewing the risks and  benefits, the patient was deemed in satisfactory                            condition to undergo the procedure.                           After obtaining informed consent, the colonoscope                            was passed under direct vision. Throughout the                            procedure, the patient's blood pressure, pulse, and                            oxygen  saturations were monitored continuously. The                            [id number] [id number]) was introduced through the                            anus and advanced to the the cecum, identified by                            appendiceal orifice and ileocecal valve. The                            colonoscopy was performed without difficulty. The                            patient tolerated the procedure well. The quality                            of the bowel preparation was adequate to identify                            polyps 6 mm and larger in size. The ileocecal                            valve, appendiceal orifice, and rectum were                            photographed. Scope In: 2:09:16 PM Scope Out: 2:41:12 PM Scope Withdrawal Time: 0 hours 6 minutes 45 seconds  Total Procedure Duration: 0 hours 31 minutes 56 seconds  Findings:      The perianal and digital rectal examinations were normal.      Multiple small and large-mouthed diverticula were found in the sigmoid       colon.      The exam was otherwise normal throughout the examined colon.      The retroflexed view of the distal rectum and anal verge was normal and       showed no anal or rectal abnormalities. Impression:               - Diverticulosis in  the sigmoid colon.                           - No specimens collected. Moderate Sedation:      Moderate (conscious) sedation was administered by the endoscopy nurse       and supervised by the endoscopist. The following parameters were       monitored: oxygen saturation, heart rate, blood pressure, CO2       capnography and response to care. Total physician intraservice time was       36 minutes. Recommendation:           - Patient has a contact number available for                            emergencies. The signs and symptoms of potential                            delayed complications were discussed with the                            patient. Return to normal  activities tomorrow.                            Written discharge instructions were provided to the                            patient.                           - Resume previous diet today.                           - Continue present medications including by mouth                            iron.                           - No repeat colonoscopy due to the absence of                            advanced adenomas. Procedure Code(s):        --- Professional ---                           [phone], Colonoscopy, flexible; diagnostic, including                            collection of specimen(s) by brushing or washing,                            when performed (separate procedure)                           99152, Moderate sedation services provided by the  same physician or other qualified health care                            professional performing the diagnostic or                            therapeutic service that the sedation supports,                            requiring the presence of an independent trained                            observer to assist in the monitoring of the                            patient's level of consciousness and physiological                            status; initial 15 minutes of intraservice time,                            patient age 63 years or older                           [phone], Moderate sedation services; each additional                            15 minutes intraservice time Diagnosis Code(s):        --- Professional ---                           R19.5, Other fecal abnormalities                           D50.9, Iron deficiency anemia, unspecified                           K57.30, Diverticulosis of large intestine without                            perforation or abscess without bleeding CPT copyright 2016 American Medical Association. All rights reserved. The codes documented in this report are preliminary and upon  coder review may  be revised to meet current compliance requirements. Hildred Laser, MD Hildred Laser, MD 12/16/2016 3:02:14 PM This report has been signed electronically. Number of Addenda: 0

## 2016-12-17 ENCOUNTER — Other Ambulatory Visit (INDEPENDENT_AMBULATORY_CARE_PROVIDER_SITE_OTHER): Payer: Self-pay | Admitting: *Deleted

## 2016-12-17 DIAGNOSIS — R160 Hepatomegaly, not elsewhere classified: Secondary | ICD-10-CM

## 2016-12-17 DIAGNOSIS — R599 Enlarged lymph nodes, unspecified: Secondary | ICD-10-CM

## 2016-12-17 DIAGNOSIS — R591 Generalized enlarged lymph nodes: Secondary | ICD-10-CM

## 2016-12-17 DIAGNOSIS — R634 Abnormal weight loss: Secondary | ICD-10-CM

## 2016-12-20 ENCOUNTER — Ambulatory Visit: Payer: Medicare Other

## 2016-12-20 ENCOUNTER — Encounter (HOSPITAL_COMMUNITY): Payer: Self-pay | Admitting: Internal Medicine

## 2016-12-20 ENCOUNTER — Ambulatory Visit (HOSPITAL_COMMUNITY)
Admission: RE | Admit: 2016-12-20 | Discharge: 2016-12-20 | Disposition: A | Discharge status: Home / Self Care | Payer: Medicare Other | Source: Ambulatory Visit | Attending: Internal Medicine | Admitting: Internal Medicine

## 2016-12-20 DIAGNOSIS — K5730 Diverticulosis of large intestine without perforation or abscess without bleeding: Secondary | ICD-10-CM | POA: Insufficient documentation

## 2016-12-20 DIAGNOSIS — R160 Hepatomegaly, not elsewhere classified: Secondary | ICD-10-CM | POA: Insufficient documentation

## 2016-12-20 DIAGNOSIS — R634 Abnormal weight loss: Secondary | ICD-10-CM

## 2016-12-20 DIAGNOSIS — R935 Abnormal findings on diagnostic imaging of other abdominal regions, including retroperitoneum: Secondary | ICD-10-CM | POA: Insufficient documentation

## 2016-12-20 DIAGNOSIS — R590 Localized enlarged lymph nodes: Secondary | ICD-10-CM | POA: Diagnosis not present

## 2016-12-20 DIAGNOSIS — I700 Atherosclerosis of aorta: Secondary | ICD-10-CM | POA: Insufficient documentation

## 2016-12-20 DIAGNOSIS — R591 Generalized enlarged lymph nodes: Secondary | ICD-10-CM

## 2016-12-20 DIAGNOSIS — R599 Enlarged lymph nodes, unspecified: Secondary | ICD-10-CM

## 2016-12-21 ENCOUNTER — Other Ambulatory Visit (HOSPITAL_COMMUNITY): Payer: Self-pay | Admitting: Oncology

## 2016-12-21 DIAGNOSIS — R591 Generalized enlarged lymph nodes: Secondary | ICD-10-CM

## 2016-12-28 ENCOUNTER — Encounter (HOSPITAL_COMMUNITY): Payer: Self-pay | Admitting: Oncology

## 2016-12-28 ENCOUNTER — Telehealth: Payer: Self-pay | Admitting: Family Medicine

## 2016-12-28 DIAGNOSIS — D509 Iron deficiency anemia, unspecified: Secondary | ICD-10-CM

## 2016-12-28 DIAGNOSIS — R590 Localized enlarged lymph nodes: Secondary | ICD-10-CM | POA: Insufficient documentation

## 2016-12-28 HISTORY — DX: Iron deficiency anemia, unspecified: D50.9

## 2016-12-28 HISTORY — DX: Localized enlarged lymph nodes: R59.0

## 2016-12-28 NOTE — Progress Notes (Signed)
[facility] Hematology/Oncology Consultation   Name: [name]      MRN: [id number]    Location: Room/bed info not found  Date: 12/29/2016 Time:4:50 PM   REFERRING PHYSICIAN:  Hildred Laser, MD (GI)  REASON FOR CONSULT:  Abdominal lymphadenopathy   DIAGNOSIS:  Extensive lymphadenopathy of abdomen on CT imaging on 12/20/2016.  HISTORY OF PRESENT ILLNESS:   [name] is a 83 y.o. female with a medical history significant for iron deficiency anemia, Stage III CKD, HTN, osteoarthritis, peripheral vascular disease, type II DM diet controlled who is referred to the [facility] for extensive abdominal lymphadenopathy on CT imaging on 12/20/2016.  She is deferred to Dr. Laural Golden, GI, by Fran Lowes, MD (Nephrology) for anemia.    Patient was seen by Deberah Castle, NP, on 12/08/2016.  Patient was set up for colonoscopy secondary to melena and EGD to rule out peptic ulcer disease.  Is also noted that the patient lost approximately 20 pounds in 6 months.  She is mildly anemic as well.  Patient underwent EGD and colonoscopy by Dr. Laural Golden demonstrating non-severe erosive esophagitis with bleeding, otherwise normal exam, 1 12/16/2016.  Subsequently, CT abdomen pelvis was performed by Dr. Laural Golden that demonstrated extensive abdominal lymphadenopathy particularly in the retroperitoneal area concerning for lymphoproliferative disease.  As result, patient was referred to oncology for further evaluation and management.  Patient confirms weight loss.  She notes that approximately 9 months ago she weighed 183 pounds.  Patient weighs in at 167 pounds.  She denies fevers or chills.  She denies any new lumps or bumps.  She does admit to mild night sweats but denies any drenching night sweats.  She denies any abdominal pain or ache.  She denies any chest pains.  She denies any headaches.  She does admit to early satiety and decrease in appetite.  I went over her meals over the  past 2 days.  She eats very little.  She denies any feelings of hunger notes.  Her daughter interjects that she is concerned about her memory issues.  She has issues with short-term memory.  Review of Systems  Constitutional: Positive for weight loss. Negative for chills and fever.  HENT: Negative.   Eyes: Negative.   Respiratory: Negative.  Negative for cough.   Cardiovascular: Negative.  Negative for chest pain.  Gastrointestinal: Negative.  Negative for blood in stool, constipation, diarrhea, melena, nausea and vomiting.  Genitourinary: Negative.   Musculoskeletal: Negative.   Skin: Negative.   Neurological: Negative.  Negative for weakness.  Endo/Heme/Allergies: Negative.   Psychiatric/Behavioral: Negative.      PAST MEDICAL HISTORY:   Past Medical History:  Diagnosis Date  . Arthritis of knee    bilateral    . Chronic renal insufficiency   . Diabetes mellitus, type 2 ([place])   . Hypertension   . Iron deficiency anemia 12/28/2016  . Lymphadenopathy, abdominal 12/28/2016  . Osteopenia   . PAD (peripheral artery disease) ([place])   . Personal history of colonic polyps 3 years ago    colonoscopy     ALLERGIES: Allergies  Allergen Reactions  . Codeine Hives  . Fosamax [Alendronate Sodium] Other (See Comments)    Pt reports excessive urination on the day she took the pill      MEDICATIONS: I have reviewed the patient's current medications.    Current Outpatient Prescriptions on File Prior to Visit  Medication Sig Dispense Refill  . acetaminophen (TYLENOL)  325 MG tablet Take 650 mg by mouth every 4 (four) hours as needed for mild pain.     Marland Kitchen aspirin EC 81 MG tablet Take 81 mg by mouth daily.    . Calcium Carbonate-Vitamin D (CALTRATE 600+D) 600-400 MG-UNIT per tablet Take 1 tablet by mouth 2 (two) times daily.      . diphenhydrAMINE (BENADRYL) 50 MG tablet Take 50 mg by mouth at bedtime as needed for itching.    . ferrous sulfate 325 (65 FE) MG EC tablet Take by mouth daily  with breakfast.    . lisinopril-hydrochlorothiazide (PRINZIDE,ZESTORETIC) 20-12.5 MG tablet TAKE ONE TABLET BY MOUTH ONCE DAILY 90 tablet 0  . multivitamin (THERAGRAN) per tablet Take 1 tablet by mouth daily.      . pantoprazole (PROTONIX) 40 MG tablet Take 1 tablet (40 mg total) by mouth daily before breakfast. 30 tablet 5  . Pediatric Multivitamins-Iron (FLINTSTONES PLUS IRON) chewable tablet Chew 1 tablet by mouth 2 (two) times daily.    . polyethylene glycol (MIRALAX / GLYCOLAX) packet Take 17 g by mouth daily as needed (constipation).      No current facility-administered medications on file prior to visit.      PAST SURGICAL HISTORY Past Surgical History:  Procedure Laterality Date  . BIOPSY  12/16/2016   Procedure: BIOPSY;  Surgeon: Rogene Houston, MD;  [facility];  Service: Endoscopy;;  gastric polyp  . bunionectomy bilateral  feet    . COLONOSCOPY N/A 12/16/2016   Procedure: COLONOSCOPY;  Surgeon: Rogene Houston, MD;  [facility];  Service: Endoscopy;  Laterality: N/A;  2:15  . DENTAL SURGERY    . ESOPHAGOGASTRODUODENOSCOPY N/A 12/16/2016   Procedure: ESOPHAGOGASTRODUODENOSCOPY (EGD);  Surgeon: Rogene Houston, MD;  Location: [facility];  Service: Endoscopy;  Laterality: N/A;  . gum disease  2010   treated by Dr. Andree Elk   . Left knee manipulation  under anasthesia due to contracture -  03/08/08   Dr. Aline Brochure   . left knee replacement  08/14/08   Dr. Aline Brochure   . TOTAL ABDOMINAL HYSTERECTOMY     bleeding   . TOTAL KNEE ARTHROPLASTY Right 05/12/2016   Procedure: TOTAL KNEE ARTHROPLASTY;  Surgeon: [name], MD;  Location: [place];  Service: Orthopedics;  Laterality: Right;  . WISDOM TOOTH EXTRACTION      FAMILY HISTORY: Family History  Problem Relation Age of Onset  . Colon cancer Father   . Cancer Mother   . Stroke Mother   . Hypertension Brother    Mother deceased at the age of 39 secondary to stroke. Father deceased at the age of 30 due  to old age. She has 2 sisters and one brother who are alive. She has 2 deceased brothers, one from cancer, and another from an unknown reason. She has 3 children.  One daughter who is 58 years old with MS.    Another daughter age 65 with  pseudotumor cerebri and trigeminal neuralgia.  Son 51 years old in good health with hypertension. No grandchildren.  SOCIAL HISTORY:  reports that she quit smoking about 17 years ago. She has a 7.50 pack-year smoking history. She has quit using smokeless tobacco. She reports that she does not drink alcohol or use drugs.  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  She is a widower and her husband passed away in 49 secondary to suicide.  Social History   Social History  . Marital status:  Widowed    Spouse name: N/A  . Number of children: 3  . Years of education: college    Occupational History  . retired Chemical engineer  Retired   Social History Main Topics  . Smoking status: Former Smoker    Packs/day: 0.25    Years: 30.00    Quit date: 10/19/1999  . Smokeless tobacco: Former Systems developer  . Alcohol use No  . Drug use: No  . Sexual activity: Not Currently   Other Topics Concern  . None   Social History Narrative  . None    PERFORMANCE STATUS: The patient's performance status is 2 - Symptomatic, <50% confined to bed  PHYSICAL EXAM: Most Recent Vital Signs: Blood pressure (!) 143/69, pulse 86, temperature 98.2 F (36.8 C), temperature source Oral, resp. rate 18, height 5\' 6"  (1.676 m), weight 167 lb (75.8 kg), SpO2 100 %. BP (!) 143/69 (BP Location: Left Arm, Patient Position: Sitting)   Pulse 86   Temp 98.2 F (36.8 C) (Oral)   Resp 18   Ht 5\' 6"  (1.676 m)   Wt 167 lb (75.8 kg)   SpO2 100%   BMI 26.95 kg/m   General Appearance:    Alert, cooperative, no distress, appears stated age, accompanied by daughter.  Head:    Normocephalic, without obvious abnormality, atraumatic  Eyes:    EOM's intact, both eyes  Ears:    Not  examined  Nose:   Not examined  Throat:   Lips, mucosa, and tongue normal  Neck:   Supple, symmetrical, trachea midline, no adenopathy  Back:     Symmetric, no curvature, ROM normal, no CVA tenderness  Lungs:     Clear to auscultation bilaterally, respirations unlabored  Chest Wall:    No tenderness or deformity   Heart:    Regular rate and rhythm, S1 and S2 normal, no murmur, rub   or gallop  Breast Exam:    Not examined  Abdomen:     Soft, non-tender, bowel sounds active all four quadrants      Genitalia:    Not examined  Rectal:    Not examined  Extremities:   Extremities normal, atraumatic, no cyanosis or edema  Pulses:   2+ and symmetric all extremities  Skin:   Skin color, texture, turgor normal, no rashes or lesions  Lymph nodes:   Cervical, supraclavicular, and axillary nodes normal  Neurologic:   CNII-XII intact, normal strength, sensation and reflexes    Throughout.    LABORATORY DATA:  CBC    Component Value Date/Time   WBC 3.2 (L) 12/29/2016 1609   RBC 3.38 (L) 12/29/2016 1609   RBC 3.38 (L) 12/29/2016 1609   HGB 10.2 (L) 12/29/2016 1609   HCT 31.4 (L) 12/29/2016 1609   PLT 252 12/29/2016 1609   MCV 92.9 12/29/2016 1609   MCH 30.2 12/29/2016 1609   MCHC 32.5 12/29/2016 1609   RDW 16.3 (H) 12/29/2016 1609   LYMPHSABS 1.0 12/29/2016 1609   MONOABS 0.4 12/29/2016 1609   EOSABS 0.1 12/29/2016 1609   BASOSABS 0.0 12/29/2016 1609     Chemistry      Component Value Date/Time   NA 143 11/08/2016 0912   NA 143 05/18/2016 1400   K 4.2 11/08/2016 0912   CL 108 11/08/2016 0912   CO2 28 11/08/2016 0912   BUN 26 (H) 11/08/2016 0912   BUN 19 05/18/2016 1400   CREATININE 1.16 (H) 11/08/2016 0912   GLU 101 05/18/2016 1400  Component Value Date/Time   CALCIUM 9.3 11/08/2016 0912   ALKPHOS 69 11/08/2016 0912   AST 34 11/08/2016 0912   ALT 10 11/08/2016 0912   BILITOT 0.8 11/08/2016 0912     Lab Results  Component Value Date   IRON 56 11/08/2016   TIBC 323  04/14/2016   FERRITIN 22 11/08/2016      RADIOGRAPHY:  CLINICAL DATA:  83 year old female with history of 20 pounds of unexplained weight loss since July 2017.  EXAM: CT ABDOMEN AND PELVIS WITHOUT CONTRAST  TECHNIQUE: Multidetector CT imaging of the abdomen and pelvis was performed following the standard protocol without IV contrast.  COMPARISON:  No priors.  FINDINGS: Lower chest: Extensive retrocrural lymphadenopathy, measuring up to 2.2 cm in thickness (image 11 of series 2).  Hepatobiliary: No definite cystic or solid hepatic lesions are identified on today's noncontrast CT examination. Unenhanced appearance of the gallbladder is unremarkable.  Pancreas: No definite pancreatic mass or peripancreatic inflammatory changes are noted on today's noncontrast CT examination.  Spleen: Unremarkable.  Adrenals/Urinary Tract: There are multiple renal lesions bilaterally. Most of these are low-attenuation and although incompletely characterized on today's noncontrast CT examination these are favored to represent cysts, measuring up to 4.3 cm in the lower pole of the right kidney. In addition, there are other lesions which are high attenuation and favored to represent proteinaceous/hemorrhagic cysts (although incompletely characterized) measuring up to 8 mm in the interpolar region of the left kidney. No hydroureteronephrosis. Urinary bladder is unremarkable in appearance.  Stomach/Bowel: Unenhanced appearance of the stomach is unremarkable. There is no pathologic dilatation of small bowel or colon. Numerous colonic diverticulae are noted, without surrounding inflammatory changes to suggest an acute diverticulitis at this time. Normal appendix.  Vascular/Lymphatic: Aortic atherosclerosis. Massive abdominal and pelvic lymphadenopathy. This is most impressive in the retroperitoneum where the lymphadenopathy completely encases the retroperitoneal vasculature. The  largest node or nodal mass is in the left para-aortic nodal station measuring up to 10.0 x 8.5 x 13.4 cm (axial image 31 of series 2 and coronal image 40 of series 5). Multiple enlarged mesenteric lymph nodes are also noted, measuring up to 2.3 cm in the root of the small bowel mesenteric (image 40 of series 2).  Reproductive: Status post hysterectomy. Ovaries are not confidently identified may be surgically absent or atrophic.  Other: No significant volume of ascites.  No pneumoperitoneum.  Musculoskeletal: Well-circumscribed 2.2 x 1.7 x 1.9 cm sclerotic lesion with narrow zone of transition is noted in the left proximal femur in the region of the lesser trochanter, favored to represent a bone island. There are no other aggressive appearing lytic or blastic lesions noted in the visualized portions of the skeleton.  IMPRESSION: 1. Extensive abdominal, pelvic and retrocrural lymphadenopathy, most pronounced in the retroperitoneum, as detailed above. Findings are highly suspicious for lymphoproliferative disorder and further evaluation is strongly recommended at this time. 2. Colonic diverticulosis without evidence of acute diverticulitis at this time. 3. Aortic atherosclerosis. 4. Additional incidental findings, as above.   Electronically Signed   By: Vinnie Langton M.D.   On: 12/20/2016 14:56     PATHOLOGY:  N/A at this time.  ASSESSMENT/PLAN:   Lymphadenopathy, abdominal Abdominal lymphadenopathy on CT imaging (extensive abdominal, pelvic and retrocrural lymphadenopathy, most pronounced in the retroperitoneum).  Labs today: CBC diff, CMET, LDH.  PET scan is ordered and scheduled preemptively for 12/30/2016.  If lymph nodes are hypermetabolic, then biopsy will be needed.  Return following PET scan to review results  and to learn medical oncology recommendations moving forward.  Iron deficiency anemia Iron deficiency anemia secondary to GI blood loss with  heme-positive stool cards with EGD/Colonoscopy by Dr. Laural Golden on 12/16/2016 demonstrating sigmoid colon diverticulosis and non-severe esophagitis with bleeding (40 cm from incisors), 2 cm hiatal hernia, and single sessile polyp (S/P polypectomy and benign pathology).  I personally reviewed and went over pathology results with the patient.  Labs today: CBC diff, anemia panel, haptoglobin, retic count, EPO level, pathology smear review, SPEP+IFE, light chain assay, hemoglobinopathy evaluation   ORDERS PLACED FOR THIS ENCOUNTER: Orders Placed This Encounter  Procedures  . CBC with Differential  . Comprehensive metabolic panel  . Pathologist smear review  . Kappa/lambda light chains  . IgG, IgA, IgM  . Immunofixation electrophoresis  . Protein electrophoresis, serum  . Protein electrophoresis, urine  . Vitamin B12  . Folate  . Iron and TIBC  . Ferritin  . Erythropoietin  . Haptoglobin  . Lactate dehydrogenase  . Hemoglobinopathy evaluation  . Reticulocytes    MEDICATIONS PRESCRIBED THIS ENCOUNTER: No orders of the defined types were placed in this encounter.   All questions were answered. The patient knows to call the clinic with any problems, questions or concerns. We can certainly see the patient much sooner if necessary.  Patient discussed with Dr. Talbert Cage and together we ascertained an up-to-date interval history, and examined the patient.  Dr. Talbert Cage developed the patient's assessment and plan.  This was a shared visit-consultation.  Her attestation will follow below.  This note is electronically signed by: Doy Mince 12/29/2016 4:50 PM

## 2016-12-28 NOTE — Assessment & Plan Note (Addendum)
Iron deficiency anemia secondary to GI blood loss with heme-positive stool cards with EGD/Colonoscopy by Dr. Laural Golden on 12/16/2016 demonstrating sigmoid colon diverticulosis and non-severe esophagitis with bleeding (40 cm from incisors), 2 cm hiatal hernia, and single sessile polyp (S/P polypectomy and benign pathology).  I personally reviewed and went over pathology results with the patient.  Labs today: CBC diff, anemia panel, haptoglobin, retic count, EPO level, pathology smear review, SPEP+IFE, light chain assay, hemoglobinopathy evaluation

## 2016-12-28 NOTE — Telephone Encounter (Signed)
Direct contact made with pt and her daughter to let them know that I was aware of new dx, and upcoming tests and to also offer services / care as needed. Pt needs to have a[facility] rescheduled duwe to conflict in schedule, have sent a msg. States she recently fell, going for colonoscopy, knee gave out, I advised use of wheelchair outside of he home

## 2016-12-28 NOTE — Assessment & Plan Note (Addendum)
Abdominal lymphadenopathy on CT imaging (extensive abdominal, pelvic and retrocrural lymphadenopathy, most pronounced in the retroperitoneum).  Labs today: CBC diff, CMET, LDH.  PET scan is ordered and scheduled preemptively for 12/30/2016.  If lymph nodes are hypermetabolic, then biopsy will be needed.  Return following PET scan to review results and to learn medical oncology recommendations moving forward.

## 2016-12-29 ENCOUNTER — Encounter (HOSPITAL_COMMUNITY): Payer: Self-pay | Admitting: Oncology

## 2016-12-29 ENCOUNTER — Encounter (HOSPITAL_COMMUNITY): Payer: Medicare Other | Attending: Oncology | Admitting: Oncology

## 2016-12-29 ENCOUNTER — Ambulatory Visit: Payer: Medicare Other

## 2016-12-29 ENCOUNTER — Other Ambulatory Visit (HOSPITAL_COMMUNITY): Payer: Medicare Other

## 2016-12-29 VITALS — BP 143/69 | HR 86 | Temp 98.2°F | Resp 18 | Ht 66.0 in | Wt 167.0 lb

## 2016-12-29 DIAGNOSIS — K449 Diaphragmatic hernia without obstruction or gangrene: Secondary | ICD-10-CM

## 2016-12-29 DIAGNOSIS — K209 Esophagitis, unspecified: Secondary | ICD-10-CM | POA: Diagnosis not present

## 2016-12-29 DIAGNOSIS — R590 Localized enlarged lymph nodes: Secondary | ICD-10-CM | POA: Diagnosis not present

## 2016-12-29 DIAGNOSIS — K5730 Diverticulosis of large intestine without perforation or abscess without bleeding: Secondary | ICD-10-CM

## 2016-12-29 DIAGNOSIS — K635 Polyp of colon: Secondary | ICD-10-CM

## 2016-12-29 DIAGNOSIS — D500 Iron deficiency anemia secondary to blood loss (chronic): Secondary | ICD-10-CM | POA: Insufficient documentation

## 2016-12-29 LAB — CBC WITH DIFFERENTIAL/PLATELET
Basophils Absolute: 0 10*3/uL (ref 0.0–0.1)
Basophils Relative: 1 %
Eosinophils Absolute: 0.1 10*3/uL (ref 0.0–0.7)
Eosinophils Relative: 4 %
HEMATOCRIT: 31.4 % — AB (ref 36.0–46.0)
Hemoglobin: 10.2 g/dL — ABNORMAL LOW (ref 12.0–15.0)
LYMPHS ABS: 1 10*3/uL (ref 0.7–4.0)
Lymphocytes Relative: 32 %
MCH: 30.2 pg (ref 26.0–34.0)
MCHC: 32.5 g/dL (ref 30.0–36.0)
MCV: 92.9 fL (ref 78.0–100.0)
MONO ABS: 0.4 10*3/uL (ref 0.1–1.0)
MONOS PCT: 12 %
NEUTROS ABS: 1.6 10*3/uL — AB (ref 1.7–7.7)
Neutrophils Relative %: 51 %
Platelets: 252 10*3/uL (ref 150–400)
RBC: 3.38 MIL/uL — ABNORMAL LOW (ref 3.87–5.11)
RDW: 16.3 % — ABNORMAL HIGH (ref 11.5–15.5)
WBC: 3.2 10*3/uL — ABNORMAL LOW (ref 4.0–10.5)

## 2016-12-29 LAB — IRON AND TIBC
IRON: 39 ug/dL (ref 28–170)
Saturation Ratios: 11 % (ref 10.4–31.8)
TIBC: 365 ug/dL (ref 250–450)
UIBC: 326 ug/dL

## 2016-12-29 LAB — RETICULOCYTES
RBC.: 3.38 MIL/uL — AB (ref 3.87–5.11)
RETIC COUNT ABSOLUTE: 37.2 10*3/uL (ref 19.0–186.0)
Retic Ct Pct: 1.1 % (ref 0.4–3.1)

## 2016-12-29 LAB — COMPREHENSIVE METABOLIC PANEL
ALK PHOS: 57 U/L (ref 38–126)
ALT: 13 U/L — ABNORMAL LOW (ref 14–54)
ANION GAP: 6 (ref 5–15)
AST: 42 U/L — ABNORMAL HIGH (ref 15–41)
Albumin: 3.3 g/dL — ABNORMAL LOW (ref 3.5–5.0)
BILIRUBIN TOTAL: 0.3 mg/dL (ref 0.3–1.2)
BUN: 27 mg/dL — ABNORMAL HIGH (ref 6–20)
CALCIUM: 9.2 mg/dL (ref 8.9–10.3)
CO2: 29 mmol/L (ref 22–32)
Chloride: 104 mmol/L (ref 101–111)
Creatinine, Ser: 1.27 mg/dL — ABNORMAL HIGH (ref 0.44–1.00)
GFR, EST AFRICAN AMERICAN: 45 mL/min — AB (ref >60)
GFR, EST NON AFRICAN AMERICAN: 39 mL/min — AB (ref >60)
GLUCOSE: 103 mg/dL — AB (ref 65–99)
Potassium: 4 mmol/L (ref 3.5–5.1)
Sodium: 139 mmol/L (ref 135–145)
Total Protein: 6.9 g/dL (ref 6.5–8.1)

## 2016-12-29 LAB — VITAMIN B12: Vitamin B-12: 731 pg/mL (ref 180–914)

## 2016-12-29 LAB — FERRITIN: FERRITIN: 19 ng/mL (ref 11–307)

## 2016-12-29 LAB — FOLATE: Folate: 23.3 ng/mL (ref >5.9)

## 2016-12-29 LAB — LACTATE DEHYDROGENASE: LDH: 379 U/L — AB (ref 98–192)

## 2016-12-29 NOTE — Patient Instructions (Signed)
[place] at [facility] Discharge Instructions  RECOMMENDATIONS MADE BY THE CONSULTANT AND ANY TEST RESULTS WILL BE SENT TO YOUR REFERRING PHYSICIAN.  You were seen today by [facility] Lab work today PET scan tomorrow Follow up in 2 weeks See [name] up front for appointments   Thank you for choosing [place] at [facility] to provide your oncology and hematology care.  To afford each patient quality time with our provider, please arrive at least 15 minutes before your scheduled appointment time.    If you have a lab appointment with the [facility] please come in thru the  Main Entrance and check in at the main information desk  You need to re-schedule your appointment should you arrive 10 or more minutes late.  We strive to give you quality time with our providers, and arriving late affects you and other patients whose appointments are after yours.  Also, if you no show three or more times for appointments you may be dismissed from the clinic at the providers discretion.     Again, thank you for choosing [facility].  Our hope is that these requests will decrease the amount of time that you wait before being seen by our physicians.       _____________________________________________________________  Should you have questions after your visit to [facility], please contact our office at [phone] between the hours of 8:30 a.m. and 4:30 p.m.  Voicemails left after 4:30 p.m. will not be returned until the following business day.  For prescription refill requests, have your pharmacy contact our office.       Resources For Cancer Patients and their Caregivers ? American Cancer Society: Can assist with transportation, wigs, general needs, runs Look Good Feel Better.        314 743 6340 ? Cancer Care: Provides financial assistance, online support groups, medication/co-pay assistance.  1-800-813-HOPE  (805) 570-6441) ? [place] Assists [place] cancer patients and their families through emotional , educational and financial support.  539-614-8297 ? [place] DSS Where to apply for food stamps, Medicaid and utility assistance. (631) 529-2566 ? [facility]: Transportation to medical appointments. 561 005 5959 ? Social Security Administration: May apply for disability if have a Stage IV cancer. (712) 882-1822 (229)674-5777 ? [employer], Disability and Transit Services: Assists with nutrition, care and transit needs. [place] Support Programs: @10RELATIVEDAYS @ > Cancer Support Group  2nd Tuesday of the month 1pm-2pm, Journey Room  > Creative Journey  3rd Tuesday of the month 1130am-1pm, Journey Room  > Look Good Feel Better  1st Wednesday of the month 10am-12 noon, Journey Room (Call [place] to register [phone])

## 2016-12-30 ENCOUNTER — Encounter (HOSPITAL_COMMUNITY)
Admission: RE | Admit: 2016-12-30 | Discharge: 2016-12-30 | Disposition: A | Discharge status: Home / Self Care | Payer: Medicare Other | Source: Ambulatory Visit | Attending: Oncology | Admitting: Oncology

## 2016-12-30 ENCOUNTER — Ambulatory Visit: Payer: Medicare Other

## 2016-12-30 DIAGNOSIS — R591 Generalized enlarged lymph nodes: Secondary | ICD-10-CM | POA: Insufficient documentation

## 2016-12-30 LAB — HEMOGLOBINOPATHY EVALUATION
HGB A2 QUANT: 2.1 % (ref 1.8–3.2)
HGB C: 0 %
Hgb A: 97.9 % (ref 96.4–98.8)
Hgb F Quant: 0 % (ref 0.0–2.0)
Hgb S Quant: 0 %
Hgb Variant: 0 %

## 2016-12-30 LAB — PROTEIN ELECTROPHORESIS, SERUM
A/G Ratio: 0.9 (ref 0.7–1.7)
Albumin ELP: 3.1 g/dL (ref 2.9–4.4)
Alpha-1-Globulin: 0.3 g/dL (ref 0.0–0.4)
Alpha-2-Globulin: 1 g/dL (ref 0.4–1.0)
Beta Globulin: 1.3 g/dL (ref 0.7–1.3)
GAMMA GLOBULIN: 0.9 g/dL (ref 0.4–1.8)
GLOBULIN, TOTAL: 3.6 g/dL (ref 2.2–3.9)
TOTAL PROTEIN ELP: 6.7 g/dL (ref 6.0–8.5)

## 2016-12-30 LAB — ERYTHROPOIETIN: ERYTHROPOIETIN: 23.9 m[IU]/mL — AB (ref 2.6–18.5)

## 2016-12-30 LAB — GLUCOSE, CAPILLARY: GLUCOSE-CAPILLARY: 86 mg/dL (ref 65–99)

## 2016-12-30 MED ORDER — FLUDEOXYGLUCOSE F - 18 (FDG) INJECTION
8.3200 | Freq: Once | INTRAVENOUS | Status: AC | PRN
  Administered 2016-12-30: 8.32 via INTRAVENOUS

## 2016-12-31 ENCOUNTER — Other Ambulatory Visit (HOSPITAL_COMMUNITY): Payer: Self-pay | Admitting: Oncology

## 2016-12-31 LAB — IGG, IGA, IGM
IgA: 489 mg/dL — ABNORMAL HIGH (ref 64–422)
IgG (Immunoglobin G), Serum: 876 mg/dL (ref 700–1600)
IgM, Serum: 119 mg/dL (ref 26–217)

## 2016-12-31 LAB — KAPPA/LAMBDA LIGHT CHAINS
KAPPA, LAMDA LIGHT CHAIN RATIO: 2 — AB (ref 0.26–1.65)
Kappa free light chain: 66.7 mg/L — ABNORMAL HIGH (ref 3.3–19.4)
LAMDA FREE LIGHT CHAINS: 33.3 mg/L — AB (ref 5.7–26.3)

## 2016-12-31 LAB — HAPTOGLOBIN: Haptoglobin: 269 mg/dL — ABNORMAL HIGH (ref 34–200)

## 2016-12-31 LAB — PATHOLOGIST SMEAR REVIEW

## 2017-01-01 NOTE — Telephone Encounter (Signed)
Hello [name], Sorry your note got closed before I could respond. When I saw patient for EGD and colonoscopy I felt she had large left lobe of the liver concerning for a mass. But it turns out that she has extensive retroperitoneal adenopathy. I hope she does well with evaluation and treatment. [employer]

## 2017-01-02 NOTE — Telephone Encounter (Signed)
Noted, thanks!

## 2017-01-03 LAB — IMMUNOFIXATION ELECTROPHORESIS
IGM, SERUM: 124 mg/dL (ref 26–217)
IgA: 494 mg/dL — ABNORMAL HIGH (ref 64–422)
IgG (Immunoglobin G), Serum: 830 mg/dL (ref 700–1600)
TOTAL PROTEIN ELP: 6.5 g/dL (ref 6.0–8.5)

## 2017-01-11 ENCOUNTER — Ambulatory Visit (INDEPENDENT_AMBULATORY_CARE_PROVIDER_SITE_OTHER): Payer: Medicare Other | Admitting: General Surgery

## 2017-01-11 ENCOUNTER — Encounter: Payer: Self-pay | Admitting: General Surgery

## 2017-01-11 VITALS — BP 160/90 | HR 93 | Temp 97.7°F | Resp 18 | Ht 66.0 in | Wt 150.0 lb

## 2017-01-11 DIAGNOSIS — R590 Localized enlarged lymph nodes: Secondary | ICD-10-CM | POA: Diagnosis not present

## 2017-01-11 NOTE — Patient Instructions (Signed)
Exploratory Laparotomy, Adult Exploratory laparotomy is a surgical procedure to examine the organs inside your belly (abdomen). Another name for this is abdominal exploration. You may have this procedure if you have abdominal pain, trauma, bleeding, infection, or obstruction. The procedure may be done if your health care provider cannot make a diagnosis from only an exam and testing. Exploratory laparotomy may be a planned procedure or an emergency procedure. You may have surgical treatment as part of the laparotomy, or you may have additional treatment after your laparotomy. This will depend on what your surgeon finds during the procedure. Tell a health care provider about:  Any allergies you have.  All medicines you are taking, including vitamins, herbs, eye drops, creams, and over-the-counter medicines.  Any problems you or family members have had with anesthetic medicines.  Any blood disorders you have.  Any surgeries you have had.  Any medical conditions you have. What are the risks? Generally, this is a safe procedure. However, problems can occur and include:  Bleeding.  Infection.  A blood clot that forms in your leg and travels to your lungs.  Damage to organs inside your abdomen.  Scar tissue that blocks your digestive tract. What happens before the procedure?  Ask your health care provider about:  Changing or stopping your regular medicines. This is especially important if you are taking diabetes medicines or blood thinners.  Taking medicines such as aspirin and ibuprofen. These medicines can thin your blood. Do not take these medicines before your procedure if your health care provider instructs you not to.  Do not eat or drink anything after midnight on the night before the procedure or as directed by your health care provider.  You may be given instructions for clearing out your bowel before surgery (bowel prep). If you are already in the hospital, the bowel prep  may be done there. What happens during the procedure?  An IV tube may be inserted into a vein. You may receive fluids and medicine through the IV tube. This may include antibiotic medicine to treat or prevent infection.  You will be given a medicine that makes you go to sleep (general anesthetic).  You may have a tube placed through your nose and into your stomach (nasogastric tube) to drain your stomach fluids.  You may have a tube placed into your bladder (urinary catheter) to drain urine.  Your abdomen will be cleaned with a germ-killing solution (antiseptic).  The surgeon will make a surgical cut (incision) in your abdomen. This is usually an up-and-down incision in the midsection of your abdomen. The incision will go through the inside lining of your abdomen (peritoneum).  Your surgeon will spread the incision wide enough to examine the inside of your abdomen.  The rest of the procedure will depend on what the surgeon finds:  The surgeon will check all organs in your abdomen for damage or obstruction. Repairs will be made when possible.  If there is blood in the abdomen, the surgeon will look for the source of the bleeding in order to stop it.  If there is yellowish-white fluid (pus) or gastric fluids in your abdomen, the surgeon will check for an infection or a hole (perforation) in your digestive tract.  If the surgeon finds infection, a drain may be placed to empty fluid that can build up in your abdomen after surgery.  If there is a growth (tumor) inside your abdomen, the surgeon may remove a piece of the growth (biopsy) to examine it  under a microscope.  When all procedures are complete, the surgeon will close your abdomen with layers of stitches (sutures).  The incision through the skin of your abdomen will be closed with sutures or staples. What happens after the procedure?  Your blood pressure, heart rate, breathing rate, and blood oxygen level will be monitored often  until the medicines you were given have worn off.  You will continue to receive fluids and nutrition through your IV tube. This will stop when you can eat and drink on your own.  You may also get antibiotic medicine and pain medicine through your IV tube.  Your nasogastric tube may be removed when you start to pass gas.  Your urinary catheter may be removed when the anesthetic wears off. This information is not intended to replace advice given to you by your health care provider. Make sure you discuss any questions you have with your health care provider. Document Released: 06/29/2001 Document Revised: 03/11/2016 Document Reviewed: 05/22/2014 Elsevier Interactive Patient Education  2017 Elsevier Inc. Lymphadenopathy Lymphadenopathy refers to swollen or enlarged lymph glands, also called lymph nodes. Lymph glands are part of your body's defense (immune) system, which protects the body from infections, germs, and diseases. Lymph glands are found in many locations in your body, including the neck, underarm, and groin. Many things can cause lymph glands to become enlarged. When your immune system responds to germs, such as viruses or bacteria, infection-fighting cells and fluid build up. This causes the glands to grow in size. Usually, this is not something to worry about. The swelling and any soreness often go away without treatment. However, swollen lymph glands can also be caused by a number of diseases. Your health care provider may do various tests to help determine the cause. If the cause of your swollen lymph glands cannot be found, it is important to monitor your condition to make sure the swelling goes away. Follow these instructions at home: Watch your condition for any changes. The following actions may help to lessen any discomfort you are feeling:  Get plenty of rest.  Take medicines only as directed by your health care provider. Your health care provider may recommend over-the-counter  medicines for pain.  Apply moist heat compresses to the site of swollen lymph nodes as directed by your health care provider. This can help reduce any pain.  Check your lymph nodes daily for any changes.  Keep all follow-up visits as directed by your health care provider. This is important. Contact a health care provider if:  Your lymph nodes are still swollen after 2 weeks.  Your swelling increases or spreads to other areas.  Your lymph nodes are hard, seem fixed to the skin, or are growing rapidly.  Your skin over the lymph nodes is red and inflamed.  You have a fever.  You have chills.  You have fatigue.  You develop a sore throat.  You have abdominal pain.  You have weight loss.  You have night sweats. Get help right away if:  You notice fluid leaking from the area of the enlarged lymph node.  You have severe pain in any area of your body.  You have chest pain.  You have shortness of breath. This information is not intended to replace advice given to you by your health care provider. Make sure you discuss any questions you have with your health care provider. Document Released: 07/13/2008 Document Revised: 03/11/2016 Document Reviewed: 05/09/2014 Elsevier Interactive Patient Education  2017 [employer].

## 2017-01-11 NOTE — Progress Notes (Signed)
[name]; [id number]; 02-15-1934   HPI   Patient is an 83 year old white female who is referred by care by [facility] cancer center for biopsy of abdominal lymphadenopathy.  This was found on recent workup.  The patient denies any fever or chills.  She denies any significant abdominal discomfort.  CT scan of the abdomen as well as a PET scan reveal retroperitoneal lymphadenopathy, iliac vessel lymphadenopathy, mesenteric adenopathy.  No significant inguinal lymphadenopathy is noted on PET scan.  She denies any nausea or vomiting.  She denies any constipation or diarrhea.  She has no pain. Past Medical History:  Diagnosis Date  . Arthritis of knee    bilateral    . Chronic renal insufficiency   . Diabetes mellitus, type 2 ([place])   . Hypertension   . Iron deficiency anemia 12/28/2016  . Lymphadenopathy, abdominal 12/28/2016  . Osteopenia   . PAD (peripheral artery disease) ([place])   . Personal history of colonic polyps 3 years ago    colonoscopy     Past Surgical History:  Procedure Laterality Date  . BIOPSY  12/16/2016   Procedure: BIOPSY;  Surgeon: Rogene Houston, MD;  [facility];  Service: Endoscopy;;  gastric polyp  . bunionectomy bilateral  feet    . COLONOSCOPY N/A 12/16/2016   Procedure: COLONOSCOPY;  Surgeon: Rogene Houston, MD;  [facility];  Service: Endoscopy;  Laterality: N/A;  2:15  . DENTAL SURGERY    . ESOPHAGOGASTRODUODENOSCOPY N/A 12/16/2016   Procedure: ESOPHAGOGASTRODUODENOSCOPY (EGD);  Surgeon: Rogene Houston, MD;  [facility];  Service: Endoscopy;  Laterality: N/A;  . gum disease  2010   treated by Dr. Andree Elk   . Left knee manipulation  under anasthesia due to contracture -  03/08/08   Dr. Aline Brochure   . left knee replacement  08/14/08   Dr. Aline Brochure   . TOTAL ABDOMINAL HYSTERECTOMY     bleeding   . TOTAL KNEE ARTHROPLASTY Right 05/12/2016   Procedure: TOTAL KNEE ARTHROPLASTY;  Surgeon: [name], MD;  Location: [place];   Service: Orthopedics;  Laterality: Right;  . WISDOM TOOTH EXTRACTION      Family History  Problem Relation Age of Onset  . Colon cancer Father   . Cancer Mother   . Stroke Mother   . Hypertension Brother     Current Outpatient Prescriptions on File Prior to Visit  Medication Sig Dispense Refill  . acetaminophen (TYLENOL) 325 MG tablet Take 650 mg by mouth every 4 (four) hours as needed for mild pain.     Marland Kitchen aspirin EC 81 MG tablet Take 81 mg by mouth daily.    . Calcium Carbonate-Vitamin D (CALTRATE 600+D) 600-400 MG-UNIT per tablet Take 1 tablet by mouth 2 (two) times daily.      . diphenhydrAMINE (BENADRYL) 50 MG tablet Take 50 mg by mouth at bedtime as needed for itching.    . ferrous sulfate 325 (65 FE) MG EC tablet Take by mouth daily with breakfast.    . lisinopril-hydrochlorothiazide (PRINZIDE,ZESTORETIC) 20-12.5 MG tablet TAKE ONE TABLET BY MOUTH ONCE DAILY 90 tablet 0  . multivitamin (THERAGRAN) per tablet Take 1 tablet by mouth daily.      . pantoprazole (PROTONIX) 40 MG tablet Take 1 tablet (40 mg total) by mouth daily before breakfast. 30 tablet 5  . Pediatric Multivitamins-Iron (FLINTSTONES PLUS IRON) chewable tablet Chew 1 tablet by mouth 2 (two) times daily.    . polyethylene glycol (MIRALAX /  GLYCOLAX) packet Take 17 g by mouth daily as needed (constipation).      No current facility-administered medications on file prior to visit.     Allergies  Allergen Reactions  . Codeine Hives  . Fosamax [Alendronate Sodium] Other (See Comments)    Pt reports excessive urination on the day she took the pill    History  Alcohol Use No    History  Smoking Status  . Former Smoker  . Packs/day: 0.25  . Years: 30.00  . Quit date: 10/19/1999  Smokeless Tobacco  . Former Systems developer    Review of Systems  Constitutional: Positive for malaise/fatigue.  HENT: Positive for sinus pain.   Eyes: Negative.   Respiratory: Positive for shortness of breath.   Cardiovascular: Negative.    Gastrointestinal: Negative.   Genitourinary: Positive for frequency.  Musculoskeletal: Positive for joint pain.  Skin: Negative.   Neurological: Negative.   Endo/Heme/Allergies: Negative.   Psychiatric/Behavioral: Negative.     Objective   Vitals:   01/11/17 1424  BP: (!) 160/90  Pulse: 93  Resp: 18  Temp: 97.7 F (36.5 C)    Physical Exam  Constitutional: She is oriented to person, place, and time and well-developed, well-nourished, and in no distress.  HENT:  Head: Normocephalic and atraumatic.  Neck: Normal range of motion. Neck supple.  Cardiovascular: Normal rate, regular rhythm and normal heart sounds.   No murmur heard. Pulmonary/Chest: Effort normal and breath sounds normal. She has no wheezes. She has no rales.  Abdominal: Soft. Bowel sounds are normal. She exhibits no distension. There is no tenderness. There is no rebound.  Lymphadenopathy:    She has no cervical adenopathy.  Neurological: She is alert and oriented to person, place, and time.  Does use a walker due to lower extremity pain.  Skin: Skin is warm and dry.  Vitals reviewed.    CT and PET images personally reviewed.   Oncology notes reviewed. Assessment    Abdominal lymphadenopathy Plan    patient is scheduled for exploratory laparotomy,  Lymph node biopsy on 01/31/2017.  The risks and benefits of the procedure including bleeding, infection, and cardiopulmonary difficulties were fully explained to the patient, who gave informed consent.

## 2017-01-11 NOTE — H&P (Signed)
[name]; [id number]; [birth year]   HPI   Patient is an 83 year old white female who is referred by care by independent cancer center for biopsy of abdominal lymphadenopathy.  This was found on recent workup.  The patient denies any fever or chills.  She denies any significant abdominal discomfort.  CT scan of the abdomen as well as a PET scan reveal retroperitoneal lymphadenopathy, iliac vessel lymphadenopathy, mesenteric adenopathy.  No significant inguinal lymphadenopathy is noted on PET scan.  She denies any nausea or vomiting.  She denies any constipation or diarrhea.  She has no pain. Past Medical History:  Diagnosis Date  . Arthritis of knee    bilateral    . Chronic renal insufficiency   . Diabetes mellitus, type 2 ([place])   . Hypertension   . Iron deficiency anemia 12/28/2016  . Lymphadenopathy, abdominal 12/28/2016  . Osteopenia   . PAD (peripheral artery disease) ([place])   . Personal history of colonic polyps 3 years ago    colonoscopy          Past Surgical History:  Procedure Laterality Date  . BIOPSY  12/16/2016   Procedure: BIOPSY;  Surgeon: Rogene Houston, MD;  [facility];  Service: Endoscopy;;  gastric polyp  . bunionectomy bilateral  feet    . COLONOSCOPY N/A 12/16/2016   Procedure: COLONOSCOPY;  Surgeon: Rogene Houston, MD;  [facility];  Service: Endoscopy;  Laterality: N/A;  2:15  . DENTAL SURGERY    . ESOPHAGOGASTRODUODENOSCOPY N/A 12/16/2016   Procedure: ESOPHAGOGASTRODUODENOSCOPY (EGD);  Surgeon: Rogene Houston, MD;  [facility];  Service: Endoscopy;  Laterality: N/A;  . gum disease  2010   treated by Dr. Andree Elk   . Left knee manipulation  under anasthesia due to contracture -  03/08/08   Dr. Aline Brochure   . left knee replacement  08/14/08   Dr. Aline Brochure   . TOTAL ABDOMINAL HYSTERECTOMY     bleeding   . TOTAL KNEE ARTHROPLASTY Right 05/12/2016   Procedure: TOTAL KNEE ARTHROPLASTY;  Surgeon: [name], MD;  Location: [facility];  Service: Orthopedics;  Laterality: Right;  . WISDOM TOOTH EXTRACTION           Family History  Problem Relation Age of Onset  . Colon cancer Father   . Cancer Mother   . Stroke Mother   . Hypertension Brother           Current Outpatient Prescriptions on File Prior to Visit  Medication Sig Dispense Refill  . acetaminophen (TYLENOL) 325 MG tablet Take 650 mg by mouth every 4 (four) hours as needed for mild pain.     Marland Kitchen aspirin EC 81 MG tablet Take 81 mg by mouth daily.    . Calcium Carbonate-Vitamin D (CALTRATE 600+D) 600-400 MG-UNIT per tablet Take 1 tablet by mouth 2 (two) times daily.      . diphenhydrAMINE (BENADRYL) 50 MG tablet Take 50 mg by mouth at bedtime as needed for itching.    . ferrous sulfate 325 (65 FE) MG EC tablet Take by mouth daily with breakfast.    . lisinopril-hydrochlorothiazide (PRINZIDE,ZESTORETIC) 20-12.5 MG tablet TAKE ONE TABLET BY MOUTH ONCE DAILY 90 tablet 0  . multivitamin (THERAGRAN) per tablet Take 1 tablet by mouth daily.      . pantoprazole (PROTONIX) 40 MG tablet Take 1 tablet (40 mg total) by mouth daily before breakfast. 30 tablet 5  . Pediatric Multivitamins-Iron (FLINTSTONES PLUS IRON) chewable tablet Chew  1 tablet by mouth 2 (two) times daily.    . polyethylene glycol (MIRALAX / GLYCOLAX) packet Take 17 g by mouth daily as needed (constipation).      No current facility-administered medications on file prior to visit.          Allergies  Allergen Reactions  . Codeine Hives  . Fosamax [Alendronate Sodium] Other (See Comments)    Pt reports excessive urination on the day she took the pill       History  Alcohol Use No        History  Smoking Status  . Former Smoker  . Packs/day: 0.25  . Years: 30.00  . Quit date: 10/19/1999  Smokeless Tobacco  . Former Systems developer    Review of Systems  Constitutional: Positive for malaise/fatigue.  HENT: Positive for sinus pain.    Eyes: Negative.   Respiratory: Positive for shortness of breath.   Cardiovascular: Negative.   Gastrointestinal: Negative.   Genitourinary: Positive for frequency.  Musculoskeletal: Positive for joint pain.  Skin: Negative.   Neurological: Negative.   Endo/Heme/Allergies: Negative.   Psychiatric/Behavioral: Negative.     Objective      Vitals:   01/11/17 1424  BP: (!) 160/90  Pulse: 93  Resp: 18  Temp: 97.7 F (36.5 C)    Physical Exam  Constitutional: She is oriented to person, place, and time and well-developed, well-nourished, and in no distress.  HENT:  Head: Normocephalic and atraumatic.  Neck: Normal range of motion. Neck supple.  Cardiovascular: Normal rate, regular rhythm and normal heart sounds.   No murmur heard. Pulmonary/Chest: Effort normal and breath sounds normal. She has no wheezes. She has no rales.  Abdominal: Soft. Bowel sounds are normal. She exhibits no distension. There is no tenderness. There is no rebound.  Lymphadenopathy:    She has no cervical adenopathy.  Neurological: She is alert and oriented to person, place, and time.  Does use a walker due to lower extremity pain.  Skin: Skin is warm and dry.  Vitals reviewed.    CT and PET images personally reviewed.   Oncology notes reviewed. Assessment    Abdominal lymphadenopathy Plan    patient is scheduled for exploratory laparotomy,  Lymph node biopsy on 01/31/2017.  The risks and benefits of the procedure including bleeding, infection, and cardiopulmonary difficulties were fully explained to the patient, who gave informed consent.

## 2017-01-12 ENCOUNTER — Telehealth: Payer: Self-pay | Admitting: Family Medicine

## 2017-01-12 NOTE — Telephone Encounter (Signed)
Patient wants to discuss her recent diagnosis with you, she stated it was "malignant lymph nodes"  She has surgery on April 16th.  Her phone number is [phone]  She would like to know if she should schedule a wellness visit before her surgery or after.

## 2017-01-12 NOTE — Telephone Encounter (Signed)
Spoke directly with patient, wants tpo come to the office, pls arrange an appt tpo see me next week around 10 or 10 :30 am , and hopefully she can have a wellness visit that same morning, will message Amy about that

## 2017-01-13 ENCOUNTER — Other Ambulatory Visit (HOSPITAL_COMMUNITY): Payer: Self-pay | Admitting: Oncology

## 2017-01-13 ENCOUNTER — Ambulatory Visit: Payer: Medicare Other

## 2017-01-13 ENCOUNTER — Telehealth: Payer: Self-pay | Admitting: Family Medicine

## 2017-01-13 NOTE — Telephone Encounter (Signed)
Left patient a voicemail to call [facility] to schedule an appt with Dr.Simpson next week.

## 2017-01-17 ENCOUNTER — Ambulatory Visit (HOSPITAL_COMMUNITY): Payer: Medicare Other

## 2017-01-20 ENCOUNTER — Ambulatory Visit: Payer: Medicare Other | Admitting: Family Medicine

## 2017-01-20 ENCOUNTER — Ambulatory Visit: Payer: Medicare Other

## 2017-01-24 NOTE — Patient Instructions (Signed)
[name]  01/24/2017     @PREFPERIOPPHARMACY @   Your procedure is scheduled on  01/31/2017   Report to [facility] at  615  A.M.  Call this number if you have problems the morning of surgery:  [phone]   Remember:  Do not eat food or drink liquids after midnight.  Take these medicines the morning of surgery with A SIP OF WATER  Lisinopril, protonix.   Do not wear jewelry, make-up or nail polish.  Do not wear lotions, powders, or perfumes, or deoderant.  Do not shave 48 hours prior to surgery.  Men may shave face and neck.  Do not bring valuables to the hospital.  [facility] is not responsible for any belongings or valuables.  Contacts, dentures or bridgework may not be worn into surgery.  Leave your suitcase in the car.  After surgery it may be brought to your room.  For patients admitted to the hospital, discharge time will be determined by your treatment team.  Patients discharged the day of surgery will not be allowed to drive home.   Name and phone number of your driver:   family Special instructions:  Bowel prep if ordered by Dr Arnoldo Morale.  Please read over the following fact sheets that you were given. Anesthesia Post-op Instructions and Care and Recovery After Surgery      Exploratory Laparotomy, Adult Exploratory laparotomy is a surgical procedure to examine the organs inside your belly (abdomen). Another name for this is abdominal exploration. You may have this procedure if you have abdominal pain, trauma, bleeding, infection, or obstruction. The procedure may be done if your health care provider cannot make a diagnosis from only an exam and testing. Exploratory laparotomy may be a planned procedure or an emergency procedure. You may have surgical treatment as part of the laparotomy, or you may have additional treatment after your laparotomy. This will depend on what your surgeon finds during the procedure. Tell a health care provider  about:  Any allergies you have.  All medicines you are taking, including vitamins, herbs, eye drops, creams, and over-the-counter medicines.  Any problems you or family members have had with anesthetic medicines.  Any blood disorders you have.  Any surgeries you have had.  Any medical conditions you have. What are the risks? Generally, this is a safe procedure. However, problems can occur and include:  Bleeding.  Infection.  A blood clot that forms in your leg and travels to your lungs.  Damage to organs inside your abdomen.  Scar tissue that blocks your digestive tract. What happens before the procedure?  Ask your health care provider about:  Changing or stopping your regular medicines. This is especially important if you are taking diabetes medicines or blood thinners.  Taking medicines such as aspirin and ibuprofen. These medicines can thin your blood. Do not take these medicines before your procedure if your health care provider instructs you not to.  Do not eat or drink anything after midnight on the night before the procedure or as directed by your health care provider.  You may be given instructions for clearing out your bowel before surgery (bowel prep). If you are already in the hospital, the bowel prep may be done there. What happens during the procedure?  An IV tube may be inserted into a vein. You may receive fluids and medicine through the IV tube. This may include antibiotic medicine to treat or prevent infection.  You will be given a medicine that makes you go to sleep (general anesthetic).  You may have a tube placed through your nose and into your stomach (nasogastric tube) to drain your stomach fluids.  You may have a tube placed into your bladder (urinary catheter) to drain urine.  Your abdomen will be cleaned with a germ-killing solution (antiseptic).  The surgeon will make a surgical cut (incision) in your abdomen. This is usually an up-and-down  incision in the midsection of your abdomen. The incision will go through the inside lining of your abdomen (peritoneum).  Your surgeon will spread the incision wide enough to examine the inside of your abdomen.  The rest of the procedure will depend on what the surgeon finds:  The surgeon will check all organs in your abdomen for damage or obstruction. Repairs will be made when possible.  If there is blood in the abdomen, the surgeon will look for the source of the bleeding in order to stop it.  If there is yellowish-white fluid (pus) or gastric fluids in your abdomen, the surgeon will check for an infection or a hole (perforation) in your digestive tract.  If the surgeon finds infection, a drain may be placed to empty fluid that can build up in your abdomen after surgery.  If there is a growth (tumor) inside your abdomen, the surgeon may remove a piece of the growth (biopsy) to examine it under a microscope.  When all procedures are complete, the surgeon will close your abdomen with layers of stitches (sutures).  The incision through the skin of your abdomen will be closed with sutures or staples. What happens after the procedure?  Your blood pressure, heart rate, breathing rate, and blood oxygen level will be monitored often until the medicines you were given have worn off.  You will continue to receive fluids and nutrition through your IV tube. This will stop when you can eat and drink on your own.  You may also get antibiotic medicine and pain medicine through your IV tube.  Your nasogastric tube may be removed when you start to pass gas.  Your urinary catheter may be removed when the anesthetic wears off. This information is not intended to replace advice given to you by your health care provider. Make sure you discuss any questions you have with your health care provider. Document Released: 06/29/2001 Document Revised: 03/11/2016 Document Reviewed: 05/22/2014 Elsevier  Interactive Patient Education  2017 [place]. Exploratory Laparotomy, Adult, Care After Refer to this sheet in the next few weeks. These instructions provide you with information about caring for yourself after your procedure. Your health care provider may also give you more specific instructions. Your treatment has been planned according to current medical practices, but problems sometimes occur. Call your health care provider if you have any problems or questions after your procedure. What can I expect after the procedure? After your procedure, it is typical to have:  Abdominal soreness.  Fatigue.  A sore throat from tubes in your throat.  A lack of appetite. Follow these instructions at home: Medicines   Take medicines only as directed by your health care provider.  Do not drive or operate heavy machinery while taking pain medicine. Incision care   There are many different ways to close and cover an incision, including stitches (sutures), skin glue, and adhesive strips. Follow your health care provider's instructions about:  Incision care.  Bandage (dressing) changes and removal.  Incision closure removal.  Do not take showers  or baths until your health care provider says that you can.  Check your incision area daily for signs of infection. Watch for:  Redness.  Tenderness.  Swelling.  Drainage. Activity   Do not lift anything that is heavier than 10 pounds (4.5 kg) until your health care provider says that it is safe.  Try to walk a little bit each day if your health care provider says that it is okay.  Ask your health care provider when you can start to do your usual activities again, such as driving, going back to work, and having sex. Eating and drinking   You may eat what you usually eat. Include lots of whole grains, fruits, and vegetables in your diet. This will help to prevent constipation.  Drink enough fluid to keep your urine clear or pale  yellow. General instructions   Keep all follow-up visits as directed by your health care provider. This is important. Contact a health care provider if:  You have a fever.  You have chills.  Your pain medicine is not helping.  You have constipation or diarrhea.  You have nausea or vomiting.  You have drainage, redness, swelling, or pain at your incision site. Get help right away if:  Your pain is getting worse.  It has been more than 3 days since you been able to have a bowel movement.  You have ongoing (persistent) vomiting.  The edges of your incision open up.  You have warmth, tenderness, and swelling in your calf.  You have trouble breathing.  You have chest pain. This information is not intended to replace advice given to you by your health care provider. Make sure you discuss any questions you have with your health care provider. Document Released: 05/18/2004 Document Revised: 03/11/2016 Document Reviewed: 05/22/2014 Elsevier Interactive Patient Education  2017 [place] Anesthesia, Adult General anesthesia is the use of medicines to make a person "go to sleep" (be unconscious) for a medical procedure. General anesthesia is often recommended when a procedure:  Is long.  Requires you to be still or in an unusual position.  Is major and can cause you to lose blood.  Is impossible to do without general anesthesia. The medicines used for general anesthesia are called general anesthetics. In addition to making you sleep, the medicines:  Prevent pain.  Control your blood pressure.  Relax your muscles. Tell a health care provider about:  Any allergies you have.  All medicines you are taking, including vitamins, herbs, eye drops, creams, and over-the-counter medicines.  Any problems you or family members have had with anesthetic medicines.  Types of anesthetics you have had in the past.  Any bleeding disorders you have.  Any surgeries you  have had.  Any medical conditions you have.  Any history of heart or lung conditions, such as heart failure, sleep apnea, or chronic obstructive pulmonary disease (COPD).  Whether you are pregnant or may be pregnant.  Whether you use tobacco, alcohol, marijuana, or street drugs.  Any history of Armed forces logistics/support/administrative officer.  Any history of depression or anxiety. What are the risks? Generally, this is a safe procedure. However, problems may occur, including:  Allergic reaction to anesthetics.  Lung and heart problems.  Inhaling food or liquids from your stomach into your lungs (aspiration).  Injury to nerves.  Waking up during your procedure and being unable to move (rare).  Extreme agitation or a state of mental confusion (delirium) when you wake up from the anesthetic.  Pharmacist, community  in the bloodstream, which can lead to stroke. These problems are more likely to develop if you are having a major surgery or if you have an advanced medical condition. You can prevent some of these complications by answering all of your health care provider's questions thoroughly and by following all pre-procedure instructions. General anesthesia can cause side effects, including:  Nausea or vomiting  A sore throat from the breathing tube.  Feeling cold or shivery.  Feeling tired, washed out, or achy.  Sleepiness or drowsiness.  Confusion or agitation. What happens before the procedure? Staying hydrated  Follow instructions from your health care provider about hydration, which may include:  Up to 2 hours before the procedure - you may continue to drink clear liquids, such as water, clear fruit juice, black coffee, and plain tea. Eating and drinking restrictions  Follow instructions from your health care provider about eating and drinking, which may include:  8 hours before the procedure - stop eating heavy meals or foods such as meat, fried foods, or fatty foods.  6 hours before the procedure - stop  eating light meals or foods, such as toast or cereal.  6 hours before the procedure - stop drinking milk or drinks that contain milk.  2 hours before the procedure - stop drinking clear liquids. Medicines   Ask your health care provider about:  Changing or stopping your regular medicines. This is especially important if you are taking diabetes medicines or blood thinners.  Taking medicines such as aspirin and ibuprofen. These medicines can thin your blood. Do not take these medicines before your procedure if your health care provider instructs you not to.  Taking new dietary supplements or medicines. Do not take these during the week before your procedure unless your health care provider approves them.  If you are told to take a medicine or to continue taking a medicine on the day of the procedure, take the medicine with sips of water. General instructions    Ask if you will be going home the same day, the following day, or after a longer hospital stay.  Plan to have someone take you home.  Plan to have someone stay with you for the first 24 hours after you leave the hospital or clinic.  For 3-6 weeks before the procedure, try not to use any tobacco products, such as cigarettes, chewing tobacco, and e-cigarettes.  You may brush your teeth on the morning of the procedure, but make sure to spit out the toothpaste. What happens during the procedure?  You will be given anesthetics through a mask and through an IV tube in one of your veins.  You may receive medicine to help you relax (sedative).  As soon as you are asleep, a breathing tube may be used to help you breathe.  An anesthesia specialist will stay with you throughout the procedure. He or she will help keep you comfortable and safe by continuing to give you medicines and adjusting the amount of medicine that you get. He or she will also watch your blood pressure, pulse, and oxygen levels to make sure that the anesthetics do  not cause any problems.  If a breathing tube was used to help you breathe, it will be removed before you wake up. The procedure may vary among health care providers and hospitals. What happens after the procedure?  You will wake up, often slowly, after the procedure is complete, usually in a recovery area.  Your blood pressure, heart rate, breathing rate,  and blood oxygen level will be monitored until the medicines you were given have worn off.  You may be given medicine to help you calm down if you feel anxious or agitated.  If you will be going home the same day, your health care provider may check to make sure you can stand, drink, and urinate.  Your health care providers will treat your pain and side effects before you go home.  Do not drive for 24 hours if you received a sedative.  You may:  Feel nauseous and vomit.  Have a sore throat.  Have mental slowness.  Feel cold or shivery.  Feel sleepy.  Feel tired.  Feel sore or achy, even in parts of your body where you did not have surgery. This information is not intended to replace advice given to you by your health care provider. Make sure you discuss any questions you have with your health care provider. Document Released: 01/11/2008 Document Revised: 03/16/2016 Document Reviewed: 09/18/2015 Elsevier Interactive Patient Education  2017 [place] Anesthesia, Adult, Care After These instructions provide you with information about caring for yourself after your procedure. Your health care provider may also give you more specific instructions. Your treatment has been planned according to current medical practices, but problems sometimes occur. Call your health care provider if you have any problems or questions after your procedure. What can I expect after the procedure? After the procedure, it is common to have:  Vomiting.  A sore throat.  Mental slowness. It is common to feel:  Nauseous.  Cold or  shivery.  Sleepy.  Tired.  Sore or achy, even in parts of your body where you did not have surgery. Follow these instructions at home: For at least 24 hours after the procedure:   Do not:  Participate in activities where you could fall or become injured.  Drive.  Use heavy machinery.  Drink alcohol.  Take sleeping pills or medicines that cause drowsiness.  Make important decisions or sign legal documents.  Take care of children on your own.  Rest. Eating and drinking   If you vomit, drink water, juice, or soup when you can drink without vomiting.  Drink enough fluid to keep your urine clear or pale yellow.  Make sure you have little or no nausea before eating solid foods.  Follow the diet recommended by your health care provider. General instructions   Have a responsible adult stay with you until you are awake and alert.  Return to your normal activities as told by your health care provider. Ask your health care provider what activities are safe for you.  Take over-the-counter and prescription medicines only as told by your health care provider.  If you smoke, do not smoke without supervision.  Keep all follow-up visits as told by your health care provider. This is important. Contact a health care provider if:  You continue to have nausea or vomiting at home, and medicines are not helpful.  You cannot drink fluids or start eating again.  You cannot urinate after 8-12 hours.  You develop a skin rash.  You have fever.  You have increasing redness at the site of your procedure. Get help right away if:  You have difficulty breathing.  You have chest pain.  You have unexpected bleeding.  You feel that you are having a life-threatening or urgent problem. This information is not intended to replace advice given to you by your health care provider. Make sure you discuss any questions  you have with your health care provider. Document Released: 01/10/2001  Document Revised: 03/08/2016 Document Reviewed: 09/18/2015 Elsevier Interactive Patient Education  2017 [employer].

## 2017-01-26 ENCOUNTER — Encounter (HOSPITAL_COMMUNITY)
Admission: RE | Admit: 2017-01-26 | Discharge: 2017-01-26 | Disposition: A | Discharge status: Home / Self Care | Payer: Medicare Other | Source: Ambulatory Visit | Attending: General Surgery | Admitting: General Surgery

## 2017-01-26 ENCOUNTER — Encounter (HOSPITAL_COMMUNITY): Payer: Self-pay

## 2017-01-26 DIAGNOSIS — R590 Localized enlarged lymph nodes: Secondary | ICD-10-CM | POA: Insufficient documentation

## 2017-01-26 DIAGNOSIS — I129 Hypertensive chronic kidney disease with stage 1 through stage 4 chronic kidney disease, or unspecified chronic kidney disease: Secondary | ICD-10-CM | POA: Insufficient documentation

## 2017-01-26 DIAGNOSIS — D509 Iron deficiency anemia, unspecified: Secondary | ICD-10-CM | POA: Insufficient documentation

## 2017-01-26 DIAGNOSIS — E1122 Type 2 diabetes mellitus with diabetic chronic kidney disease: Secondary | ICD-10-CM | POA: Diagnosis not present

## 2017-01-26 DIAGNOSIS — I739 Peripheral vascular disease, unspecified: Secondary | ICD-10-CM | POA: Insufficient documentation

## 2017-01-26 DIAGNOSIS — Z86010 Personal history of colonic polyps: Secondary | ICD-10-CM | POA: Diagnosis not present

## 2017-01-26 DIAGNOSIS — M949 Disorder of cartilage, unspecified: Secondary | ICD-10-CM | POA: Diagnosis not present

## 2017-01-26 DIAGNOSIS — N183 Chronic kidney disease, stage 3 (moderate): Secondary | ICD-10-CM | POA: Insufficient documentation

## 2017-01-26 DIAGNOSIS — Z01812 Encounter for preprocedural laboratory examination: Secondary | ICD-10-CM | POA: Insufficient documentation

## 2017-01-26 DIAGNOSIS — K219 Gastro-esophageal reflux disease without esophagitis: Secondary | ICD-10-CM | POA: Insufficient documentation

## 2017-01-26 DIAGNOSIS — M8580 Other specified disorders of bone density and structure, unspecified site: Secondary | ICD-10-CM | POA: Insufficient documentation

## 2017-01-26 DIAGNOSIS — M170 Bilateral primary osteoarthritis of knee: Secondary | ICD-10-CM | POA: Diagnosis not present

## 2017-01-26 HISTORY — DX: Gastro-esophageal reflux disease without esophagitis: K21.9

## 2017-01-26 HISTORY — DX: Personal history of urinary calculi: Z87.442

## 2017-01-26 LAB — SURGICAL PCR SCREEN
MRSA, PCR: NEGATIVE
STAPHYLOCOCCUS AUREUS: NEGATIVE

## 2017-01-26 LAB — CBC WITH DIFFERENTIAL/PLATELET
BASOS PCT: 0 %
Basophils Absolute: 0 10*3/uL (ref 0.0–0.1)
EOS ABS: 0.1 10*3/uL (ref 0.0–0.7)
EOS PCT: 1 %
HCT: 34.6 % — ABNORMAL LOW (ref 36.0–46.0)
Hemoglobin: 10.8 g/dL — ABNORMAL LOW (ref 12.0–15.0)
Lymphocytes Relative: 19 %
Lymphs Abs: 1 10*3/uL (ref 0.7–4.0)
MCH: 29.2 pg (ref 26.0–34.0)
MCHC: 31.2 g/dL (ref 30.0–36.0)
MCV: 93.5 fL (ref 78.0–100.0)
MONO ABS: 0.7 10*3/uL (ref 0.1–1.0)
MONOS PCT: 14 %
Neutro Abs: 3.3 10*3/uL (ref 1.7–7.7)
Neutrophils Relative %: 66 %
PLATELETS: 205 10*3/uL (ref 150–400)
RBC: 3.7 MIL/uL — ABNORMAL LOW (ref 3.87–5.11)
RDW: 15.5 % (ref 11.5–15.5)
WBC: 5.1 10*3/uL (ref 4.0–10.5)

## 2017-01-26 LAB — BASIC METABOLIC PANEL
ANION GAP: 9 (ref 5–15)
BUN: 31 mg/dL — AB (ref 6–20)
CALCIUM: 9.4 mg/dL (ref 8.9–10.3)
CO2: 29 mmol/L (ref 22–32)
CREATININE: 1.22 mg/dL — AB (ref 0.44–1.00)
Chloride: 103 mmol/L (ref 101–111)
GFR calc Af Amer: 47 mL/min — ABNORMAL LOW (ref >60)
GFR calc non Af Amer: 41 mL/min — ABNORMAL LOW (ref >60)
GLUCOSE: 119 mg/dL — AB (ref 65–99)
Potassium: 3.6 mmol/L (ref 3.5–5.1)
Sodium: 141 mmol/L (ref 135–145)

## 2017-01-26 LAB — PREPARE RBC (CROSSMATCH)

## 2017-01-31 ENCOUNTER — Encounter (HOSPITAL_COMMUNITY)
Admission: RE | Disposition: A | Discharge status: Home / Self Care | Payer: Self-pay | Source: Ambulatory Visit | Attending: General Surgery

## 2017-01-31 ENCOUNTER — Encounter (HOSPITAL_COMMUNITY): Payer: Self-pay | Admitting: *Deleted

## 2017-01-31 ENCOUNTER — Inpatient Hospital Stay (HOSPITAL_COMMUNITY): Payer: Medicare Other | Admitting: Anesthesiology

## 2017-01-31 ENCOUNTER — Inpatient Hospital Stay (HOSPITAL_COMMUNITY)
Admission: RE | Admit: 2017-01-31 | Discharge: 2017-02-02 | DRG: 842 | Disposition: A | Discharge status: Home / Self Care | Payer: Medicare Other | Source: Ambulatory Visit | Attending: General Surgery | Admitting: General Surgery

## 2017-01-31 DIAGNOSIS — C8593 Non-Hodgkin lymphoma, unspecified, intra-abdominal lymph nodes: Principal | ICD-10-CM | POA: Diagnosis present

## 2017-01-31 DIAGNOSIS — Z96652 Presence of left artificial knee joint: Secondary | ICD-10-CM | POA: Diagnosis present

## 2017-01-31 DIAGNOSIS — K219 Gastro-esophageal reflux disease without esophagitis: Secondary | ICD-10-CM | POA: Diagnosis present

## 2017-01-31 DIAGNOSIS — R590 Localized enlarged lymph nodes: Secondary | ICD-10-CM | POA: Diagnosis present

## 2017-01-31 DIAGNOSIS — Z90710 Acquired absence of both cervix and uterus: Secondary | ICD-10-CM

## 2017-01-31 DIAGNOSIS — I129 Hypertensive chronic kidney disease with stage 1 through stage 4 chronic kidney disease, or unspecified chronic kidney disease: Secondary | ICD-10-CM | POA: Diagnosis present

## 2017-01-31 DIAGNOSIS — Z87891 Personal history of nicotine dependence: Secondary | ICD-10-CM | POA: Diagnosis not present

## 2017-01-31 DIAGNOSIS — R591 Generalized enlarged lymph nodes: Secondary | ICD-10-CM | POA: Diagnosis not present

## 2017-01-31 DIAGNOSIS — Z79899 Other long term (current) drug therapy: Secondary | ICD-10-CM | POA: Diagnosis not present

## 2017-01-31 DIAGNOSIS — E1151 Type 2 diabetes mellitus with diabetic peripheral angiopathy without gangrene: Secondary | ICD-10-CM | POA: Diagnosis present

## 2017-01-31 DIAGNOSIS — E1122 Type 2 diabetes mellitus with diabetic chronic kidney disease: Secondary | ICD-10-CM | POA: Diagnosis present

## 2017-01-31 DIAGNOSIS — N183 Chronic kidney disease, stage 3 (moderate): Secondary | ICD-10-CM | POA: Diagnosis present

## 2017-01-31 DIAGNOSIS — Z888 Allergy status to other drugs, medicaments and biological substances status: Secondary | ICD-10-CM

## 2017-01-31 DIAGNOSIS — Z7982 Long term (current) use of aspirin: Secondary | ICD-10-CM | POA: Diagnosis not present

## 2017-01-31 DIAGNOSIS — Z885 Allergy status to narcotic agent status: Secondary | ICD-10-CM

## 2017-01-31 HISTORY — PX: LYMPH NODE BIOPSY: SHX201

## 2017-01-31 HISTORY — PX: LAPAROTOMY: SHX154

## 2017-01-31 LAB — GLUCOSE, CAPILLARY
GLUCOSE-CAPILLARY: 99 mg/dL (ref 65–99)
Glucose-Capillary: 95 mg/dL (ref 65–99)
Glucose-Capillary: 137 mg/dL — ABNORMAL HIGH (ref 65–99)

## 2017-01-31 SURGERY — LAPAROTOMY, EXPLORATORY
Anesthesia: General

## 2017-01-31 MED ORDER — ORAL CARE MOUTH RINSE
15.0000 mL | Freq: Two times a day (BID) | OROMUCOSAL | Status: DC
  Administered 2017-02-01 (×2): 15 mL via OROMUCOSAL

## 2017-01-31 MED ORDER — PANTOPRAZOLE SODIUM 40 MG PO TBEC
40.0000 mg | DELAYED_RELEASE_TABLET | Freq: Every day | ORAL | Status: DC
  Administered 2017-02-01 – 2017-02-02 (×2): 40 mg via ORAL
  Filled 2017-01-31 (×2): qty 1

## 2017-01-31 MED ORDER — ONDANSETRON 4 MG PO TBDP: 4.0000 mg | ORAL_TABLET | Freq: Four times a day (QID) | ORAL | Status: DC | PRN

## 2017-01-31 MED ORDER — KETOROLAC TROMETHAMINE 30 MG/ML IJ SOLN
INTRAMUSCULAR | Status: AC
  Filled 2017-01-31: qty 1

## 2017-01-31 MED ORDER — LISINOPRIL-HYDROCHLOROTHIAZIDE 20-12.5 MG PO TABS: 1.0000 | ORAL_TABLET | Freq: Every day | ORAL | Status: DC

## 2017-01-31 MED ORDER — TRAMADOL HCL 50 MG PO TABS
50.0000 mg | ORAL_TABLET | Freq: Four times a day (QID) | ORAL | Status: DC | PRN
  Administered 2017-02-01 (×2): 50 mg via ORAL
  Filled 2017-01-31 (×2): qty 1

## 2017-01-31 MED ORDER — LACTATED RINGERS IV SOLN
INTRAVENOUS | Status: DC
  Administered 2017-01-31 – 2017-02-01 (×2): via INTRAVENOUS

## 2017-01-31 MED ORDER — POVIDONE-IODINE 10 % EX OINT
TOPICAL_OINTMENT | CUTANEOUS | Status: DC | PRN
  Administered 2017-01-31: 1 via TOPICAL

## 2017-01-31 MED ORDER — FENTANYL CITRATE (PF) 250 MCG/5ML IJ SOLN
INTRAMUSCULAR | Status: AC
  Filled 2017-01-31: qty 5

## 2017-01-31 MED ORDER — EPHEDRINE SULFATE 50 MG/ML IJ SOLN
INTRAMUSCULAR | Status: DC | PRN
  Administered 2017-01-31 (×4): 5 mg via INTRAVENOUS

## 2017-01-31 MED ORDER — PROPOFOL 10 MG/ML IV BOLUS
INTRAVENOUS | Status: DC | PRN
  Administered 2017-01-31: 20 mg via INTRAVENOUS
  Administered 2017-01-31: 120 mg via INTRAVENOUS

## 2017-01-31 MED ORDER — ROCURONIUM BROMIDE 100 MG/10ML IV SOLN
INTRAVENOUS | Status: DC | PRN
  Administered 2017-01-31: 30 mg via INTRAVENOUS

## 2017-01-31 MED ORDER — ONDANSETRON HCL 4 MG/2ML IJ SOLN: 4.0000 mg | Freq: Four times a day (QID) | INTRAMUSCULAR | Status: DC | PRN

## 2017-01-31 MED ORDER — ENOXAPARIN SODIUM 40 MG/0.4ML ~~LOC~~ SOLN
40.0000 mg | SUBCUTANEOUS | Status: DC
  Filled 2017-01-31: qty 0.4

## 2017-01-31 MED ORDER — DIPHENHYDRAMINE HCL 25 MG PO CAPS
50.0000 mg | ORAL_CAPSULE | Freq: Every evening | ORAL | Status: DC | PRN
  Filled 2017-01-31: qty 2

## 2017-01-31 MED ORDER — PROPOFOL 10 MG/ML IV BOLUS
INTRAVENOUS | Status: AC
  Filled 2017-01-31: qty 20

## 2017-01-31 MED ORDER — NEOSTIGMINE METHYLSULFATE 10 MG/10ML IV SOLN
INTRAVENOUS | Status: DC | PRN
  Administered 2017-01-31: 3 mg via INTRAVENOUS

## 2017-01-31 MED ORDER — SODIUM CHLORIDE 0.9 % IJ SOLN
INTRAMUSCULAR | Status: AC
  Filled 2017-01-31: qty 10

## 2017-01-31 MED ORDER — GLYCOPYRROLATE 0.2 MG/ML IJ SOLN
INTRAMUSCULAR | Status: AC
  Filled 2017-01-31: qty 1

## 2017-01-31 MED ORDER — MIDAZOLAM HCL 2 MG/2ML IJ SOLN
1.0000 mg | INTRAMUSCULAR | Status: AC
  Administered 2017-01-31 (×2): 1 mg via INTRAVENOUS
  Filled 2017-01-31: qty 2

## 2017-01-31 MED ORDER — GLYCOPYRROLATE 0.2 MG/ML IJ SOLN
INTRAMUSCULAR | Status: DC | PRN
  Administered 2017-01-31: 0.4 mg via INTRAVENOUS

## 2017-01-31 MED ORDER — HYDROCHLOROTHIAZIDE 12.5 MG PO CAPS
12.5000 mg | ORAL_CAPSULE | Freq: Every day | ORAL | Status: DC
  Administered 2017-02-01 – 2017-02-02 (×2): 12.5 mg via ORAL
  Filled 2017-01-31 (×2): qty 1

## 2017-01-31 MED ORDER — ENOXAPARIN SODIUM 40 MG/0.4ML ~~LOC~~ SOLN
40.0000 mg | Freq: Once | SUBCUTANEOUS | Status: AC
  Administered 2017-01-31: 40 mg via SUBCUTANEOUS

## 2017-01-31 MED ORDER — CHLORHEXIDINE GLUCONATE CLOTH 2 % EX PADS: 6.0000 | MEDICATED_PAD | Freq: Once | CUTANEOUS | Status: DC

## 2017-01-31 MED ORDER — ONDANSETRON HCL 4 MG/2ML IJ SOLN
4.0000 mg | Freq: Once | INTRAMUSCULAR | Status: AC
  Administered 2017-01-31: 4 mg via INTRAVENOUS
  Filled 2017-01-31: qty 2

## 2017-01-31 MED ORDER — PHENYLEPHRINE HCL 10 MG/ML IJ SOLN
INTRAMUSCULAR | Status: AC
  Filled 2017-01-31: qty 1

## 2017-01-31 MED ORDER — ENOXAPARIN SODIUM 40 MG/0.4ML ~~LOC~~ SOLN
SUBCUTANEOUS | Status: AC
  Filled 2017-01-31: qty 0.4

## 2017-01-31 MED ORDER — LIDOCAINE HCL (PF) 1 % IJ SOLN
INTRAMUSCULAR | Status: AC
  Filled 2017-01-31: qty 5

## 2017-01-31 MED ORDER — LACTATED RINGERS IV SOLN
INTRAVENOUS | Status: DC
  Administered 2017-01-31: 1000 mL via INTRAVENOUS
  Administered 2017-01-31: 08:00:00 via INTRAVENOUS

## 2017-01-31 MED ORDER — LIDOCAINE HCL (CARDIAC) 20 MG/ML IV SOLN
INTRAVENOUS | Status: DC | PRN
  Administered 2017-01-31: 40 mg via INTRATRACHEAL

## 2017-01-31 MED ORDER — FENTANYL CITRATE (PF) 100 MCG/2ML IJ SOLN
INTRAMUSCULAR | Status: DC | PRN
  Administered 2017-01-31: 75 ug via INTRAVENOUS
  Administered 2017-01-31: 25 ug via INTRAVENOUS

## 2017-01-31 MED ORDER — SIMETHICONE 80 MG PO CHEW: 40.0000 mg | CHEWABLE_TABLET | Freq: Four times a day (QID) | ORAL | Status: DC | PRN

## 2017-01-31 MED ORDER — SODIUM CHLORIDE 0.9 % IJ SOLN
INTRAMUSCULAR | Status: AC
  Filled 2017-01-31: qty 20

## 2017-01-31 MED ORDER — HYDROMORPHONE HCL 1 MG/ML IJ SOLN
0.2500 mg | INTRAMUSCULAR | Status: DC | PRN
  Administered 2017-01-31: 0.25 mg via INTRAVENOUS
  Administered 2017-01-31: 0.5 mg via INTRAVENOUS
  Administered 2017-01-31: 0.25 mg via INTRAVENOUS
  Filled 2017-01-31: qty 1

## 2017-01-31 MED ORDER — NEOSTIGMINE METHYLSULFATE 10 MG/10ML IV SOLN
INTRAVENOUS | Status: AC
  Filled 2017-01-31: qty 1

## 2017-01-31 MED ORDER — LISINOPRIL 10 MG PO TABS
20.0000 mg | ORAL_TABLET | Freq: Every day | ORAL | Status: DC
  Administered 2017-02-01 – 2017-02-02 (×2): 20 mg via ORAL
  Filled 2017-01-31 (×2): qty 2

## 2017-01-31 MED ORDER — ACETAMINOPHEN 650 MG RE SUPP: 650.0000 mg | Freq: Four times a day (QID) | RECTAL | Status: DC | PRN

## 2017-01-31 MED ORDER — HEMOSTATIC AGENTS (NO CHARGE) OPTIME
TOPICAL | Status: DC | PRN
  Administered 2017-01-31: 1

## 2017-01-31 MED ORDER — MORPHINE SULFATE (PF) 2 MG/ML IV SOLN: 2.0000 mg | INTRAVENOUS | Status: DC | PRN

## 2017-01-31 MED ORDER — CEFAZOLIN SODIUM-DEXTROSE 2-4 GM/100ML-% IV SOLN
INTRAVENOUS | Status: AC
  Filled 2017-01-31: qty 100

## 2017-01-31 MED ORDER — BUPIVACAINE LIPOSOME 1.3 % IJ SUSP
INTRAMUSCULAR | Status: DC | PRN
  Administered 2017-01-31: 20 mL

## 2017-01-31 MED ORDER — EPHEDRINE SULFATE 50 MG/ML IJ SOLN
INTRAMUSCULAR | Status: AC
  Filled 2017-01-31: qty 1

## 2017-01-31 MED ORDER — ACETAMINOPHEN 325 MG PO TABS
650.0000 mg | ORAL_TABLET | Freq: Four times a day (QID) | ORAL | Status: DC | PRN
  Administered 2017-01-31 – 2017-02-01 (×2): 650 mg via ORAL
  Filled 2017-01-31 (×2): qty 2

## 2017-01-31 MED ORDER — BUPIVACAINE LIPOSOME 1.3 % IJ SUSP
INTRAMUSCULAR | Status: AC
  Filled 2017-01-31: qty 20

## 2017-01-31 MED ORDER — ROCURONIUM BROMIDE 50 MG/5ML IV SOLN
INTRAVENOUS | Status: AC
  Filled 2017-01-31: qty 1

## 2017-01-31 MED ORDER — POVIDONE-IODINE 10 % EX OINT
TOPICAL_OINTMENT | CUTANEOUS | Status: AC
  Filled 2017-01-31: qty 1

## 2017-01-31 MED ORDER — KETOROLAC TROMETHAMINE 30 MG/ML IJ SOLN
30.0000 mg | Freq: Once | INTRAMUSCULAR | Status: AC
  Administered 2017-01-31: 30 mg via INTRAVENOUS

## 2017-01-31 MED ORDER — SODIUM CHLORIDE 0.9 % IR SOLN
Status: DC | PRN
  Administered 2017-01-31: 1000 mL

## 2017-01-31 MED ORDER — CEFAZOLIN SODIUM-DEXTROSE 2-4 GM/100ML-% IV SOLN
2.0000 g | INTRAVENOUS | Status: AC
  Administered 2017-01-31: 2 g via INTRAVENOUS

## 2017-01-31 SURGICAL SUPPLY — 71 items
APPLIER CLIP 11 MED OPEN (CLIP) ×4
APPLIER CLIP 13 LRG OPEN (CLIP)
APR CLP LRG 13 20 CLIP (CLIP)
APR CLP MED 11 20 MLT OPN (CLIP) ×2
BAG HAMPER (MISCELLANEOUS) ×4 IMPLANT
BARRIER SKIN 2 3/4 (OSTOMY) IMPLANT
BARRIER SKIN 2 3/4 INCH (OSTOMY)
BARRIER SKIN OD2.25 2 3/4 FLNG (OSTOMY) IMPLANT
BRR SKN FLT 2.75X2.25 2 PC (OSTOMY)
CELLS DAT CNTRL 66122 CELL SVR (MISCELLANEOUS) IMPLANT
CHLORAPREP W/TINT 26ML (MISCELLANEOUS) ×4 IMPLANT
CLAMP POUCH DRAINAGE QUIET (OSTOMY) IMPLANT
CLIP APPLIE 11 MED OPEN (CLIP) IMPLANT
CLIP APPLIE 13 LRG OPEN (CLIP) IMPLANT
CLOTH BEACON ORANGE TIMEOUT ST (SAFETY) ×4 IMPLANT
CONT SPEC 4OZ CLIKSEAL STRL BL (MISCELLANEOUS) ×2 IMPLANT
COVER LIGHT HANDLE STERIS (MISCELLANEOUS) ×8 IMPLANT
DRAPE WARM FLUID 44X44 (DRAPE) ×1 IMPLANT
DRSG OPSITE POSTOP 4X10 (GAUZE/BANDAGES/DRESSINGS) ×2 IMPLANT
ELECT BLADE 6 FLAT ULTRCLN (ELECTRODE) IMPLANT
ELECT REM PT RETURN 9FT ADLT (ELECTROSURGICAL) ×4
ELECTRODE REM PT RTRN 9FT ADLT (ELECTROSURGICAL) ×2 IMPLANT
GAUZE SPONGE 4X4 12PLY STRL (GAUZE/BANDAGES/DRESSINGS) ×4 IMPLANT
GLOVE BIOGEL PI IND STRL 7.0 (GLOVE) ×4 IMPLANT
GLOVE BIOGEL PI INDICATOR 7.0 (GLOVE) ×4
GLOVE SURG SS PI 7.5 STRL IVOR (GLOVE) ×4 IMPLANT
GOWN STRL REUS W/TWL LRG LVL3 (GOWN DISPOSABLE) ×12 IMPLANT
HANDLE SUCTION POOLE (INSTRUMENTS) ×2 IMPLANT
HEMOSTAT SNOW SURGICEL 2X4 (HEMOSTASIS) ×2 IMPLANT
INST SET MAJOR GENERAL (KITS) ×4 IMPLANT
KIT REMOVER STAPLE SKIN (MISCELLANEOUS) IMPLANT
KIT ROOM TURNOVER APOR (KITS) ×4 IMPLANT
MANIFOLD NEPTUNE II (INSTRUMENTS) ×4 IMPLANT
NDL HYPO 18GX1.5 BLUNT FILL (NEEDLE) ×1 IMPLANT
NEEDLE HYPO 18GX1.5 BLUNT FILL (NEEDLE) ×4 IMPLANT
NS IRRIG 1000ML POUR BTL (IV SOLUTION) ×8 IMPLANT
PACK ABDOMINAL MAJOR (CUSTOM PROCEDURE TRAY) ×4 IMPLANT
PAD ARMBOARD 7.5X6 YLW CONV (MISCELLANEOUS) ×4 IMPLANT
POUCH OSTOMY 2 3/4  H 3804 (WOUND CARE)
POUCH OSTOMY 2 3/4 H 3804 (WOUND CARE)
POUCH OSTOMY 2 PC DRNBL 2.75 (WOUND CARE) IMPLANT
RELOAD LINEAR CUT PROX 55 BLUE (ENDOMECHANICALS) IMPLANT
RELOAD PROXIMATE 75MM BLUE (ENDOMECHANICALS) IMPLANT
RELOAD STAPLE 55 3.8 BLU REG (ENDOMECHANICALS) IMPLANT
RELOAD STAPLE 75 3.8 BLU REG (ENDOMECHANICALS) IMPLANT
RETRACTOR WND ALEXIS 18 MED (MISCELLANEOUS) ×2 IMPLANT
RETRACTOR WND ALEXIS 25 LRG (MISCELLANEOUS) IMPLANT
RTRCTR WOUND ALEXIS 18CM MED (MISCELLANEOUS)
RTRCTR WOUND ALEXIS 25CM LRG (MISCELLANEOUS)
SEALER TISSUE X1 CVD JAW (INSTRUMENTS) IMPLANT
SET BASIN LINEN APH (SET/KITS/TRAYS/PACK) ×4 IMPLANT
SPONGE INTESTINAL PEANUT (DISPOSABLE) ×2 IMPLANT
SPONGE LAP 18X18 X RAY DECT (DISPOSABLE) ×4 IMPLANT
STAPLER GUN LINEAR PROX 60 (STAPLE) IMPLANT
STAPLER PROXIMATE 55 BLUE (STAPLE) IMPLANT
STAPLER PROXIMATE 75MM BLUE (STAPLE) IMPLANT
STAPLER VISISTAT (STAPLE) ×4 IMPLANT
SUCTION POOLE HANDLE (INSTRUMENTS)
SUT CHROMIC 0 SH (SUTURE) IMPLANT
SUT CHROMIC 2 0 SH (SUTURE) IMPLANT
SUT CHROMIC 3 0 SH 27 (SUTURE) ×2 IMPLANT
SUT NOVA NAB GS-26 0 60 (SUTURE) ×4 IMPLANT
SUT PDS AB 0 CTX 60 (SUTURE) ×3 IMPLANT
SUT PROLENE 2 0 SH 30 (SUTURE) ×1 IMPLANT
SUT SILK 2 0 (SUTURE)
SUT SILK 2 0 REEL (SUTURE) ×2 IMPLANT
SUT SILK 2-0 18XBRD TIE 12 (SUTURE) ×2 IMPLANT
SUT SILK 3 0 SH CR/8 (SUTURE) IMPLANT
SYR 20CC LL (SYRINGE) ×4 IMPLANT
TAPE CLOTH SURG 4X10 WHT LF (GAUZE/BANDAGES/DRESSINGS) ×2 IMPLANT
TRAY FOLEY CATH SILVER 16FR (SET/KITS/TRAYS/PACK) ×2 IMPLANT

## 2017-01-31 NOTE — Progress Notes (Signed)
Patient has not voided.  Bladder scan approximately 204 ml.  Patient does not report discomfort.  Patient up to [facility] but unable to void.  Dr. Arnoldo Morale notified via text page.  Dr. Arnoldo Morale returned page and stated to continue to monitor.

## 2017-01-31 NOTE — Anesthesia Preprocedure Evaluation (Signed)
Anesthesia Evaluation  Patient identified by MRN, date of birth, ID band Patient awake    Reviewed: Allergy & Precautions, NPO status   Airway Mallampati: II  TM Distance: >3 FB     Dental  (+) Teeth Intact   Pulmonary neg pulmonary ROS, former smoker,    breath sounds clear to auscultation       Cardiovascular hypertension, Pt. on medications + Peripheral Vascular Disease   Rhythm:Regular Rate:Normal     Neuro/Psych    GI/Hepatic negative GI ROS, Neg liver ROS, GERD  Medicated and Controlled,  Endo/Other  diabetes, Type 2  Renal/GU Renal disease     Musculoskeletal   Abdominal   Peds  Hematology   Anesthesia Other Findings   Reproductive/Obstetrics                             Anesthesia Physical Anesthesia Plan  ASA: III  Anesthesia Plan: General   Post-op Pain Management:    Induction: Intravenous  Airway Management Planned: Oral ETT  Additional Equipment:   Intra-op Plan:   Post-operative Plan: Extubation in OR  Informed Consent: I have reviewed the patients History and Physical, chart, labs and discussed the procedure including the risks, benefits and alternatives for the proposed anesthesia with the patient or authorized representative who has indicated his/her understanding and acceptance.     Plan Discussed with:   Anesthesia Plan Comments:         Anesthesia Quick Evaluation

## 2017-01-31 NOTE — Anesthesia Procedure Notes (Signed)
Procedure Name: Intubation Date/Time: 01/31/2017 7:36 AM Performed by: Raenette Rover Pre-anesthesia Checklist: Patient identified, Emergency Drugs available, Suction available and Patient being monitored Patient Re-evaluated:Patient Re-evaluated prior to inductionOxygen Delivery Method: Circle system utilized Preoxygenation: Pre-oxygenation with 100% oxygen Intubation Type: IV induction Ventilation: Mask ventilation without difficulty and Oral airway inserted - appropriate to patient size Laryngoscope Size: Mac and 3 Grade View: Grade I Tube type: Oral Tube size: 7.0 mm Number of attempts: 1 Airway Equipment and Method: Stylet Placement Confirmation: positive ETCO2,  ETT inserted through vocal cords under direct vision,  CO2 detector and breath sounds checked- equal and bilateral Secured at: 21 cm Tube secured with: Tape Dental Injury: Teeth and Oropharynx as per pre-operative assessment

## 2017-01-31 NOTE — Op Note (Signed)
Patient:  [name]  DOB:  1934-07-25  MRN:  [id number]   Preop Diagnosis:  Generalized lymphadenopathy  Postop Diagnosis:  Same  Procedure:  Exploratory laparotomy, mesenteric lymph node biopsy  Surgeon:  Aviva Signs, M.D.  Anes:  Gen. endotracheal  Indications:  Patient is an 83 year old black female with generalized lymphadenopathy who was referred for mesenteric lymph node biopsy to rule out lymphoma. The risks and benefits of the procedure including bleeding, infection, cardiopulmonary difficulties were fully explained to the patient, who gave informed consent.  Procedure note:  The patient was placed in the supine position. After induction of general endotracheal anesthesia, the abdomen was prepped and draped using usual sterile technique with DuraPrep. Surgical site confirmation was performed.  An upper midline incision was made. The peritoneal cavity was entered into without difficulty. The patient had 2 large greater than 3 cm lymph nodes at the base of the small bowel mesentery. These were removed without difficulty. Any bleeding was controlled using clips. The specimen was sent to pathology for flow cytometry. Surgicel was placed in the excision bed. No bleeding was noted at the end of the procedure. The bowel was returned into the abdominal cavity and orderly fashion. The fascia was reapproximated using a looped 0 PDS running suture. Subcutaneous layer was irrigated with normal saline. Exparel was instilled into the surrounding wound. The skin was closed using staples. Betadine ointment and dry sterile dressings were applied.  All tape and needle counts were correct at the end of procedure. The patient was extubated in the operating room and transferred to PACU in stable condition.  Complications:  None  EBL:  25 mL  Specimen:  Mesenteric lymph nodes

## 2017-01-31 NOTE — Interval H&P Note (Signed)
History and Physical Interval Note:  01/31/2017 7:06 AM  [name]  has presented today for surgery, with the diagnosis of abdominal lymphadenopathy  The various methods of treatment have been discussed with the patient and family. After consideration of risks, benefits and other options for treatment, the patient has consented to  Procedure(s): EXPLORATORY LAPAROTOMY (N/A) as a surgical intervention .  The patient's history has been reviewed, patient examined, no change in status, stable for surgery.  I have reviewed the patient's chart and labs.  Questions were answered to the patient's satisfaction.     Aviva Signs

## 2017-01-31 NOTE — Transfer of Care (Signed)
Immediate Anesthesia Transfer of Care Note  Patient: [name]  Procedure(s) Performed: Procedure(s): EXPLORATORY LAPAROTOMY (N/A) LYMPH NODE BIOPSY  Patient Location: PACU  Anesthesia Type:General  Level of Consciousness: awake, alert , oriented and patient cooperative  Airway & Oxygen Therapy: Patient Spontanous Breathing and Patient connected to nasal cannula oxygen  Post-op Assessment: Report given to RN and Post -op Vital signs reviewed and stable  Post vital signs: Reviewed and stable  Last Vitals:  Vitals:   01/31/17 0715 01/31/17 0720  BP: 131/81 127/75  Resp: 19 (!) 25  Temp:      Last Pain:  Vitals:   01/31/17 0650  TempSrc: Oral      Patients Stated Pain Goal: 6 (24/09/73 5329)  Complications: No apparent anesthesia complications

## 2017-01-31 NOTE — Anesthesia Postprocedure Evaluation (Signed)
Anesthesia Post Note  Patient: [name]  Procedure(s) Performed: Procedure(s) (LRB): EXPLORATORY LAPAROTOMY (N/A) LYMPH NODE BIOPSY  Patient location during evaluation: PACU Anesthesia Type: General Level of consciousness: awake Pain management: satisfactory to patient Vital Signs Assessment: post-procedure vital signs reviewed and stable Respiratory status: spontaneous breathing and patient connected to nasal cannula oxygen Cardiovascular status: stable Anesthetic complications: no     Last Vitals:  Vitals:   01/31/17 0900 01/31/17 0915  BP: (!) 119/58   Pulse: (!) 50 (!) 51  Resp: 16 14  Temp:      Last Pain:  Vitals:   01/31/17 0915  TempSrc:   PainSc: 4                  [name]

## 2017-02-01 ENCOUNTER — Encounter (HOSPITAL_COMMUNITY): Payer: Self-pay | Admitting: General Surgery

## 2017-02-01 LAB — PHOSPHORUS: Phosphorus: 3.7 mg/dL (ref 2.5–4.6)

## 2017-02-01 LAB — CBC
HEMATOCRIT: 31.7 % — AB (ref 36.0–46.0)
Hemoglobin: 10 g/dL — ABNORMAL LOW (ref 12.0–15.0)
MCH: 29.2 pg (ref 26.0–34.0)
MCHC: 31.5 g/dL (ref 30.0–36.0)
MCV: 92.7 fL (ref 78.0–100.0)
PLATELETS: 241 10*3/uL (ref 150–400)
RBC: 3.42 MIL/uL — ABNORMAL LOW (ref 3.87–5.11)
RDW: 15.2 % (ref 11.5–15.5)
WBC: 4.6 10*3/uL (ref 4.0–10.5)

## 2017-02-01 LAB — BASIC METABOLIC PANEL
ANION GAP: 6 (ref 5–15)
BUN: 22 mg/dL — AB (ref 6–20)
CALCIUM: 8.7 mg/dL — AB (ref 8.9–10.3)
CO2: 29 mmol/L (ref 22–32)
CREATININE: 1.34 mg/dL — AB (ref 0.44–1.00)
Chloride: 103 mmol/L (ref 101–111)
GFR calc Af Amer: 42 mL/min — ABNORMAL LOW (ref >60)
GFR, EST NON AFRICAN AMERICAN: 36 mL/min — AB (ref >60)
Glucose, Bld: 102 mg/dL — ABNORMAL HIGH (ref 65–99)
Potassium: 4 mmol/L (ref 3.5–5.1)
Sodium: 138 mmol/L (ref 135–145)

## 2017-02-01 LAB — MAGNESIUM: Magnesium: 2.2 mg/dL (ref 1.7–2.4)

## 2017-02-01 NOTE — Addendum Note (Signed)
Addendum  created 02/01/17 1335 by Mickel Baas, CRNA   Sign clinical note

## 2017-02-01 NOTE — Progress Notes (Signed)
1 Day Post-Op  Subjective: Patient doing well. Patient has voided multiple times this morning. Mild incisional pain is noted. She has not had a bowel movement or passed flatus yet.  Objective: Vital signs in last 24 hours: Temp:  [97.5 F (36.4 C)-98.9 F (37.2 C)] 98.9 F (37.2 C) (04/17 0500) Pulse Rate:  [48-105] 105 (04/17 0500) Resp:  [11-17] 17 (04/17 0500) BP: (106-119)/(48-60) 118/60 (04/17 0500) SpO2:  [97 %-100 %] 100 % (04/17 0500) Last BM Date: 01/30/17  Intake/Output from previous day: 04/16 0701 - 04/17 0700 In: 1600 [P.O.:300; I.V.:1300] Out: 325 [Urine:300; Blood:25] Intake/Output this shift: Total I/O In: -  Out: 300 [Urine:300]  General appearance: alert, cooperative and no distress Resp: clear to auscultation bilaterally Cardio: regular rate and rhythm, S1, S2 normal, no murmur, click, rub or gallop GI: Soft, incision healing well. Minimal bowel sounds appreciated.  Lab Results:   Recent Labs  02/01/17 0500  WBC 4.6  HGB 10.0*  HCT 31.7*  PLT 241   BMET  Recent Labs  02/01/17 0500  NA 138  K 4.0  CL 103  CO2 29  GLUCOSE 102*  BUN 22*  CREATININE 1.34*  CALCIUM 8.7*   PT/INR No results for input(s): LABPROT, INR in the last 72 hours.  Studies/Results: No results found.  Anti-infectives: Anti-infectives    Start     Dose/Rate Route Frequency Ordered Stop   01/31/17 0636  ceFAZolin (ANCEF) IVPB 2g/100 mL premix     2 g 200 mL/hr over 30 Minutes Intravenous On call to O.R. 01/31/17 0636 01/31/17 0740      Assessment/Plan: s/p Procedure(s): EXPLORATORY LAPAROTOMY LYMPH NODE BIOPSY Impression: Stable on postoperative day 1. Awaiting return of bowel function. Final pathology pending. Anticipate discharge in next 24-48 hours.  LOS: 1 day    Aviva Signs 02/01/2017

## 2017-02-01 NOTE — Anesthesia Postprocedure Evaluation (Signed)
Anesthesia Post Note  Patient: [name]  Procedure(s) Performed: Procedure(s) (LRB): EXPLORATORY LAPAROTOMY (N/A) LYMPH NODE BIOPSY  Patient location during evaluation: Nursing Unit Anesthesia Type: General Level of consciousness: awake and alert, oriented and patient cooperative Pain management: pain level controlled Vital Signs Assessment: post-procedure vital signs reviewed and stable Respiratory status: spontaneous breathing Cardiovascular status: stable Postop Assessment: no signs of nausea or vomiting Anesthetic complications: no     Last Vitals:  Vitals:   02/01/17 0500 02/01/17 1002  BP: 118/60 (!) 118/54  Pulse: (!) 105 (!) 107  Resp: 17 18  Temp: 37.2 C     Last Pain:  Vitals:   02/01/17 0745  TempSrc:   PainSc: 1                  ADAMS, AMY A

## 2017-02-01 NOTE — Care Management Note (Signed)
Case Management Note  Patient Details  Name: [name] MRN: [id number] Date of Birth: 11-01-1933  Subjective/Objective:                  Pt admitted s/p exp lap. Pt is from home, lives with her daughter. She is ind with ADL's. She has PCP, transportation and insurance with drug coverage. She has walker, WC and BSC. She plans to return home with self care. No needs communicated.   Action/Plan: Anticipate DC home with self care in next 24-48 hrs.   Expected Discharge Date:       02/02/2017           Expected Discharge Plan:  Home/Self Care  In-House Referral:  NA  Discharge planning Services  CM Consult  Post Acute Care Choice:  NA Choice offered to:  NA  Status of Service:  Completed, signed off  Sherald Barge, RN 02/01/2017, 2:43 PM

## 2017-02-02 ENCOUNTER — Ambulatory Visit: Payer: Medicare Other

## 2017-02-02 LAB — CBC
HEMATOCRIT: 31.3 % — AB (ref 36.0–46.0)
Hemoglobin: 10.4 g/dL — ABNORMAL LOW (ref 12.0–15.0)
MCH: 30.7 pg (ref 26.0–34.0)
MCHC: 33.2 g/dL (ref 30.0–36.0)
MCV: 92.3 fL (ref 78.0–100.0)
PLATELETS: 264 10*3/uL (ref 150–400)
RBC: 3.39 MIL/uL — ABNORMAL LOW (ref 3.87–5.11)
RDW: 15.3 % (ref 11.5–15.5)
WBC: 4.3 10*3/uL (ref 4.0–10.5)

## 2017-02-02 MED ORDER — TRAMADOL HCL 50 MG PO TABS: 50.0000 mg | ORAL_TABLET | Freq: Four times a day (QID) | ORAL | 0 refills | Status: DC | PRN

## 2017-02-02 NOTE — Care Management Note (Signed)
Case Management Note  Patient Details  Name: [name] MRN: [id number] Date of Birth: 06-Oct-1934  Expected Discharge Date:  02/02/17               Expected Discharge Plan:  [place]  In-House Referral:  NA  Discharge planning Services  CM Consult  Post Acute Care Choice:  Home Health Choice offered to:  Patient  HH Arranged:  PT, Nurse's Aide [place] Agency:  [facility]  Status of Service:  Completed, signed off   Additional Comments: Pt ordered HH PT/aid. Pt has chosen [facility], aware [place] has 48hrs to make first visit. Romualdo Bolk, of [facility], aware of referral and will obtain pt info from chart.   Sherald Barge, RN 02/02/2017, 10:35 AM

## 2017-02-02 NOTE — Discharge Instructions (Signed)
Exploratory Laparotomy, Adult, Care After Refer to this sheet in the next few weeks. These instructions provide you with information about caring for yourself after your procedure. Your health care provider may also give you more specific instructions. Your treatment has been planned according to current medical practices, but problems sometimes occur. Call your health care provider if you have any problems or questions after your procedure. What can I expect after the procedure? After your procedure, it is typical to have:  Abdominal soreness.  Fatigue.  A sore throat from tubes in your throat.  A lack of appetite. Follow these instructions at home: Medicines   Take medicines only as directed by your health care provider.  Do not drive or operate heavy machinery while taking pain medicine. Incision care   There are many different ways to close and cover an incision, including stitches (sutures), skin glue, and adhesive strips. Follow your health care provider's instructions about:  Incision care.  Bandage (dressing) changes and removal.  Incision closure removal.  Do not take showers or baths until your health care provider says that you can.  Check your incision area daily for signs of infection. Watch for:  Redness.  Tenderness.  Swelling.  Drainage. Activity   Do not lift anything that is heavier than 10 pounds (4.5 kg) until your health care provider says that it is safe.  Try to walk a little bit each day if your health care provider says that it is okay.  Ask your health care provider when you can start to do your usual activities again, such as driving, going back to work, and having sex. Eating and drinking   You may eat what you usually eat. Include lots of whole grains, fruits, and vegetables in your diet. This will help to prevent constipation.  Drink enough fluid to keep your urine clear or pale yellow. General instructions   Keep all follow-up visits  as directed by your health care provider. This is important. Contact a health care provider if:  You have a fever.  You have chills.  Your pain medicine is not helping.  You have constipation or diarrhea.  You have nausea or vomiting.  You have drainage, redness, swelling, or pain at your incision site. Get help right away if:  Your pain is getting worse.  It has been more than 3 days since you been able to have a bowel movement.  You have ongoing (persistent) vomiting.  The edges of your incision open up.  You have warmth, tenderness, and swelling in your calf.  You have trouble breathing.  You have chest pain. This information is not intended to replace advice given to you by your health care provider. Make sure you discuss any questions you have with your health care provider. Document Released: 05/18/2004 Document Revised: 03/11/2016 Document Reviewed: 05/22/2014 Elsevier Interactive Patient Education  2017 [employer].

## 2017-02-03 NOTE — Discharge Summary (Signed)
Physician Discharge Summary  Patient ID: [name] MRN: [id number] DOB/AGE: 12-18-1933 83 y.o.  Admit date: 01/31/2017  discharge date: 02/02/2017 Admission Diagnoses:  Generalized lymphadenopathy  Discharge Diagnoses:  Same Active Problems:   Generalized lymphadenopathy   Discharged Condition:  Good  Summary:   patient is an 83 year old  black female who was found on recent workup to have generalized lymphadenopathy, especially mesenteric lymphadenopathy.  She underwent exploratory laparotomy with lymph node biopsy on 01/31/2017.  She tolerated the surgery well.  Her postoperative course was for the most part unremarkable.  On postoperative day 1, she did feel a little weak, but hemoglobin remained stable.  Her diet was advanced that difficulty.  She was discharged home on postoperative day 2 in good and improving condition.  Final pathology still pending.  Treatments: surgery: Exploratory laparotomy, lymph node biopsy on 01/31/17  Discharge Exam: Blood pressure 134/66, pulse (!) 58, temperature 98.8 F (37.1 C), temperature source Oral, resp. rate 16, SpO2 98 %. General appearance: alert, cooperative and no distress Resp: clear to auscultation bilaterally Cardio: regular rate and rhythm, S1, S2 normal, no murmur, click, rub or gallop GI: Soft, incision healing well.  Disposition: 01-Home or Self Care  Discharge Instructions    Diet - low sodium heart healthy    Complete by:  As directed    Increase activity slowly    Complete by:  As directed      Allergies as of 02/02/2017      Reactions   Codeine Hives   Fosamax [alendronate Sodium] Other (See Comments)   Pt reports excessive urination on the day she took the pill      Medication List    STOP taking these medications   amoxicillin 500 MG capsule Commonly known as:  AMOXIL     TAKE these medications   acetaminophen 325 MG tablet Commonly known as:  TYLENOL Take 650 mg by mouth every 4 (four) hours as needed  for mild pain.   aspirin EC 81 MG tablet Take 81 mg by mouth daily.   CALCIUM 600+D 600-800 MG-UNIT Tabs Generic drug:  Calcium Carb-Cholecalciferol Take 1 tablet by mouth 2 (two) times daily.   diphenhydrAMINE 50 MG tablet Commonly known as:  BENADRYL Take 50 mg by mouth at bedtime as needed for itching.   FLINTSTONES PLUS IRON chewable tablet Chew 1 tablet by mouth 2 (two) times daily.   lisinopril-hydrochlorothiazide 20-12.5 MG tablet Commonly known as:  PRINZIDE,ZESTORETIC TAKE ONE TABLET BY MOUTH ONCE DAILY   magnesium hydroxide 400 MG/5ML suspension Commonly known as:  MILK OF MAGNESIA Take 30 mLs by mouth every other day.   multivitamin per tablet Take 1 tablet by mouth daily.   pantoprazole 40 MG tablet Commonly known as:  PROTONIX Take 1 tablet (40 mg total) by mouth daily before breakfast.   polyethylene glycol packet Commonly known as:  MIRALAX / GLYCOLAX Take 17 g by mouth daily as needed (constipation).   traMADol 50 MG tablet Commonly known as:  ULTRAM Take 1 tablet (50 mg total) by mouth every 6 (six) hours as needed for moderate pain.      Follow-up Information    Aviva Signs, MD On 02/10/2017.   Specialty:  General Surgery Why:  at 10:45 am Contact information: [id number] [place] [address] [phone]           Signed: Aviva Signs 02/03/2017, 10:06 PM

## 2017-02-04 LAB — BPAM RBC
BLOOD PRODUCT EXPIRATION DATE: 201805062359
Blood Product Expiration Date: 201805032359
UNIT TYPE AND RH: 5100
Unit Type and Rh: 5100

## 2017-02-04 LAB — TYPE AND SCREEN
ABO/RH(D): O POS
ANTIBODY SCREEN: NEGATIVE
UNIT DIVISION: 0
UNIT DIVISION: 0

## 2017-02-07 DIAGNOSIS — C8330 Diffuse large B-cell lymphoma, unspecified site: Secondary | ICD-10-CM | POA: Insufficient documentation

## 2017-02-07 NOTE — Progress Notes (Signed)
[name] [place], [place] [id number]   CLINIC:  Medical Oncology/Hematology  PCP:  Tula Nakayama, MD [address], [place] [address] [phone]   REASON FOR VISIT:  Follow-up for Diffuse Large B-cell Lymphoma (DLBCL):   CURRENT THERAPY: Treatment consideration    BRIEF ONCOLOGIC HISTORY:    Diffuse large B cell lymphoma ([place])   12/20/2016 Imaging    CT abd/pelvis: IMPRESSION: 1. Extensive abdominal, pelvic and retrocrural lymphadenopathy, most pronounced in the retroperitoneum, as detailed above. Findings are highly suspicious for lymphoproliferative disorder and further evaluation is strongly recommended at this time. 2. Colonic diverticulosis without evidence of acute diverticulitis at this time. 3. Aortic atherosclerosis. 4. Additional incidental findings, as above.      12/30/2016 PET scan    IMPRESSION: 1. Extensive hypermetabolic abdominal and pelvic adenopathy, including bulky retroperitoneal adenopathy lifting of the abdominal aorta away from the spine. One of the larger lymph nodes measures 8.4 cm in short axis with maximum SUV 25.0. 2. Small pleural focus of tumor in the left posteromedial chest, maximum SUV only 4.2. This represents the only thoracic involvement. 3. Other imaging findings of potential clinical significance: Atherosclerotic aortic arch. Mild cardiomegaly. Mild atelectasis in both lower lobes. Hyperdense left renal cysts as on prior CT. Mild left hydronephrosis likely due to extrinsic mass effect on the left ureter. Right greater than left degenerative arthropathy of the hips.      01/31/2017 Surgery    Exploratory lap Arnoldo Morale)       01/31/2017 Pathology Results    Lymph node biospy: DLBCL, high-grade, monoclonal kappa restricted.       02/07/2017 Initial Diagnosis    Diffuse large B cell lymphoma (HCC)       INTERVAL HISTORY:  [name] 83 y.o. female returns for follow-up and to  review results of recent imaging and pathology.   She is here today with her daughter, who lives with Ms. Mukherjee.  She is continuing to recover from exploratory laparotomy surgery on 01/31/17; she denies any pain. She had some constipation after surgery, but that has resolved; "I took care of that."    Denies fever/chills. She does have night sweats, which have been present for the past about 8-9 months. Her weight loss of about 20 lbs occurred over about 8-9 months as well.  She feels really tired.  Her appetite is about 50% of her baseline.  Most of these symptoms started shortly after her knee replacement surgery in 04/2016.  Endorses occasional dizziness.  She has not noticed any masses/swelling to her neck, axilla, or groin.  Otherwise she is largely without complaints today.   Her daughter shares that prior to her most recent abdominal surgery, [name] was very independent. She ambulates with a walker and does have a wheelchair that she uses periodically. However, she has been very independent in her ADLs (bathes herself, cooks her own food, dresses herself, etc).  She does not drive much anymore. Her daughter works, so the patient is left at home for several hours per day and is able to independently care for herself.  No recent falls.     REVIEW OF SYSTEMS:  Review of Systems  Constitutional: Positive for appetite change, diaphoresis and fatigue. Negative for chills and fever.  HENT:  Negative.   Eyes: Negative.   Respiratory: Negative.   Cardiovascular: Positive for leg swelling (chronic ).  Gastrointestinal: Positive for constipation (resolved with laxatives). Negative for abdominal pain, blood  in stool, diarrhea, nausea and vomiting.  Endocrine: Negative.   Genitourinary: Negative.  Negative for dysuria, hematuria and vaginal bleeding.   Musculoskeletal: Negative.   Skin:       Healing abd wound s/p recent surgery   Neurological: Positive for dizziness.  Hematological: Negative.   Negative for adenopathy.  Psychiatric/Behavioral: Negative.      PAST MEDICAL/SURGICAL HISTORY:  Past Medical History:  Diagnosis Date  . Arthritis of knee    bilateral    . Chronic renal insufficiency   . Diabetes mellitus, type 2 ([place])   . GERD (gastroesophageal reflux disease)   . History of kidney stones   . Hypertension   . Iron deficiency anemia 12/28/2016  . Lymphadenopathy, abdominal 12/28/2016  . Osteopenia   . PAD (peripheral artery disease) ([place])   . Personal history of colonic polyps 3 years ago    colonoscopy    Past Surgical History:  Procedure Laterality Date  . BIOPSY  12/16/2016   Procedure: BIOPSY;  Surgeon: Rogene Houston, MD;  [facility];  Service: Endoscopy;;  gastric polyp  . bunionectomy bilateral  feet    . COLONOSCOPY N/A 12/16/2016   Procedure: COLONOSCOPY;  Surgeon: Rogene Houston, MD;  [facility];  Service: Endoscopy;  Laterality: N/A;  2:15  . DENTAL SURGERY    . ESOPHAGOGASTRODUODENOSCOPY N/A 12/16/2016   Procedure: ESOPHAGOGASTRODUODENOSCOPY (EGD);  Surgeon: Rogene Houston, MD;  [facility];  Service: Endoscopy;  Laterality: N/A;  . gum disease  2010   treated by Dr. Andree Elk   . LAPAROTOMY N/A 01/31/2017   Procedure: EXPLORATORY LAPAROTOMY;  Surgeon: Aviva Signs, MD;  Location: [facility];  Service: General;  Laterality: N/A;  . Left knee manipulation  under anasthesia due to contracture -  03/08/08   Dr. Aline Brochure   . left knee replacement  08/14/08   Dr. Aline Brochure   . LYMPH NODE BIOPSY  01/31/2017   Procedure: LYMPH NODE BIOPSY;  Surgeon: Aviva Signs, MD;  Location: [facility];  Service: General;;  . TOTAL ABDOMINAL HYSTERECTOMY     bleeding   . TOTAL KNEE ARTHROPLASTY Right 05/12/2016   Procedure: TOTAL KNEE ARTHROPLASTY;  Surgeon: [name], MD;  Location: [facility];  Service: Orthopedics;  Laterality: Right;  . WISDOM TOOTH EXTRACTION       SOCIAL HISTORY:  Social History   Social History  . Marital status:  Widowed    Spouse name: N/A  . Number of children: 3  . Years of education: college    Occupational History  . retired Chemical engineer  Retired   Social History Main Topics  . Smoking status: Former Smoker    Packs/day: 0.25    Years: 30.00    Quit date: 10/19/1999  . Smokeless tobacco: Former Systems developer  . Alcohol use No  . Drug use: No  . Sexual activity: Not Currently   Other Topics Concern  . Not on file   Social History Narrative  . No narrative on file    FAMILY HISTORY:  Family History  Problem Relation Age of Onset  . Colon cancer Father   . Cancer Mother   . Stroke Mother   . Hypertension Brother     CURRENT MEDICATIONS:  Outpatient Encounter Prescriptions as of 02/08/2017  Medication Sig Note  . acetaminophen (TYLENOL) 325 MG tablet Take 650 mg by mouth every 4 (four) hours as needed for mild pain.    Marland Kitchen aspirin EC 81 MG tablet Take  81 mg by mouth daily. 01/24/2017: On hold due to upcoming procedure.  . Calcium Carb-Cholecalciferol (CALCIUM 600+D) 600-800 MG-UNIT TABS Take 1 tablet by mouth 2 (two) times daily.   . diphenhydrAMINE (BENADRYL) 50 MG tablet Take 50 mg by mouth at bedtime as needed for itching.   Marland Kitchen lisinopril-hydrochlorothiazide (PRINZIDE,ZESTORETIC) 20-12.5 MG tablet TAKE ONE TABLET BY MOUTH ONCE DAILY   . magnesium hydroxide (MILK OF MAGNESIA) 400 MG/5ML suspension Take 30 mLs by mouth every other day.   . multivitamin (THERAGRAN) per tablet Take 1 tablet by mouth daily.   01/24/2017: Time varies  . pantoprazole (PROTONIX) 40 MG tablet Take 1 tablet (40 mg total) by mouth daily before breakfast.   . Pediatric Multivitamins-Iron (FLINTSTONES PLUS IRON) chewable tablet Chew 1 tablet by mouth 2 (two) times daily.   . polyethylene glycol (MIRALAX / GLYCOLAX) packet Take 17 g by mouth daily as needed (constipation).  12/14/2016: Hasn't started yet.  . traMADol (ULTRAM) 50 MG tablet Take 1 tablet (50 mg total) by mouth every 6 (six) hours as needed for moderate  pain.    No facility-administered encounter medications on file as of 02/08/2017.     ALLERGIES:  Allergies  Allergen Reactions  . Codeine Hives  . Fosamax [Alendronate Sodium] Other (See Comments)    Pt reports excessive urination on the day she took the pill     PHYSICAL EXAM:  ECOG Performance status: 2 - Symptomatic; requires occasional assistance   Vitals:   02/08/17 1518  BP: 124/70  Pulse: 85  Resp: 16  Temp: 98.8 F (37.1 C)   There were no vitals filed for this visit.  Physical Exam  Constitutional: She is oriented to person, place, and time.  Seen seated in wheelchair.   HENT:  Head: Normocephalic and atraumatic.  Mouth/Throat: Oropharynx is clear and moist. No oropharyngeal exudate.  Eyes: Conjunctivae are normal. Pupils are equal, round, and reactive to light. No scleral icterus.  Neck: Normal range of motion. Neck supple.  Cardiovascular: Normal rate and regular rhythm.   Pulmonary/Chest: Effort normal.  Diminished breath sounds   Abdominal: Soft. Bowel sounds are normal. There is no tenderness.  Midline abdominal incision with staples in place; no exudate, erythema, or evidence of infection. Appears to be healing well.   Musculoskeletal: Normal range of motion.  Mild, 1+ BLE/ankle edema (L>R)  Lymphadenopathy:    She has no cervical adenopathy.    She has no axillary adenopathy.       Right: No supraclavicular adenopathy present.       Left: No supraclavicular adenopathy present.  Neurological: She is alert and oriented to person, place, and time. No cranial nerve deficit.  Skin: Skin is warm and dry. No rash noted.  Psychiatric: Mood, memory, affect and judgment normal.  Nursing note and vitals reviewed.    LABORATORY DATA:  I have reviewed the labs as listed.  CBC    Component Value Date/Time   WBC 4.3 02/02/2017 0511   RBC 3.39 (L) 02/02/2017 0511   HGB 10.4 (L) 02/02/2017 0511   HCT 31.3 (L) 02/02/2017 0511   PLT 264 02/02/2017 0511    MCV 92.3 02/02/2017 0511   MCH 30.7 02/02/2017 0511   MCHC 33.2 02/02/2017 0511   RDW 15.3 02/02/2017 0511   LYMPHSABS 1.0 01/26/2017 1419   MONOABS 0.7 01/26/2017 1419   EOSABS 0.1 01/26/2017 1419   BASOSABS 0.0 01/26/2017 1419   CMP Latest Ref Rng & Units 02/01/2017 01/26/2017 12/29/2016  Glucose 65 -  99 mg/dL 102(H) 119(H) 103(H)  BUN 6 - 20 mg/dL 22(H) 31(H) 27(H)  Creatinine 0.44 - 1.00 mg/dL 1.34(H) 1.22(H) 1.27(H)  Sodium 135 - 145 mmol/L 138 141 139  Potassium 3.5 - 5.1 mmol/L 4.0 3.6 4.0  Chloride 101 - 111 mmol/L 103 103 104  CO2 22 - 32 mmol/L _0 Calcium 8.9 - 10.3 mg/dL 8.7(L) 9.4 9.2  Total Protein 6.5 - 8.1 g/dL - - 6.9  Total Bilirubin 0.3 - 1.2 mg/dL - - 0.3  Alkaline Phos 38 - 126 U/L - - 57  AST 15 - 41 U/L - - 42(H)  ALT 14 - 54 U/L - - 13(L)    PENDING LABS:    DIAGNOSTIC IMAGING:  PET scan: 12/30/16       PATHOLOGY:  Lymph node biopsy: 01/31/17    Cytology: 01/31/17    ASSESSMENT & PLAN:   At least Stage II Diffuse Large B-cell Lymphoma (DLBCL):  -Recent exploratory laparotomy with Dr. Arnoldo Morale confirms suspected lymphoma, specifically diffuse large B-cell lymphoma. We discussed in basic terms the pathophysiology of lymphoma and staging.  We discussed treatment options for lymphoma, including chemotherapy.  She was given patient education materials for Non-Hodgkin Lymphoma (in general) and booklet specifically about DLBCL.  -She understands that since she has not had bone marrow biopsy, it is difficult to ascertain the exact stage of her disease at this point. We discussed the procedure for bone marrow biopsy including the risks and benefits; she has the option to have bone marrow biopsy procedure here at the cancer center with local anesthetic vs referral for image-guided bone marrow biopsy at [facility] in [place] with sedation medications.  I shared with her that better understanding the stage can affect the types of treatment she may  require (intrathecal chemotherapy, radiation therapy, etc).  She would like to know the extent of her disease to have a better understanding of the stage of her disease, which is certainly reasonable.  She elects to have bone marrow biopsy with sedation at [facility]; orders placed today for this procedure. Initial treatment recommendations will not be affected by stage of disease, but we will work on getting the bone marrow done before starting chemotherapy.  -Discussed R-CHOP chemotherapy regimen (Rituxan, Cytoxan, Doxorubicin, Vincristine, & Prednisone).  We discussed the role that each medication plays in the treatment of DLBCL. Briefly discussed possible side effects of treatment. The goal of treatment in her case is curative, based on at least stage II disease.  We discussed possible toxicities of chemotherapy, particularly in advanced age.  She will get more intensive chemo teaching with our Nurse Navigator at a later date.  Explained that we will need to get baseline ECHO for pre-chemo evaluation; orders placed today.  She was provided some written information about the chemo regimen today.   -We had brief discussions about her goals of care, in terms of how aggressive she would like to be with treatment.  She states, "If it will help me, then I want to try it."  She understands that chemotherapy effects are often cumulative and we often have to weight the benefits/risks of aggressive chemotherapy if there is poor tolerance or adverse effects, particularly with her advanced age and marginal performance status (currently ECOG 2).  Of course, we may have to dose-reduce chemotherapy in the future based on tolerance. She would like to proceed with at least cycle #1 of aggressive chemotherapy with R-CHOP.  -Discussed purpose and procedure for port-a-cath placement  for chemotherapy.  Reviewed possible risks of port-a-cath insertion and care including bleeding, infection, and pneumothorax. Will refer to Dr.  Arnoldo Morale for port placement. She will see him on Thursday, 02/10/17 to have abdominal staples removed.   -We want to allow plenty of time for her abdominal wound to heal (clinically, it looks great today without any evidence of infection).  Will plan to initiate chemotherapy in about 3 weeks. Treatment plan for R-CHOP built today. Will add OnPro for G-CSF support given her advanced age.  -Will plan for follow-up visit on day 1, cycle #1 of chemotherapy, as well as 1 week after cycle #1 chemotherapy for toxicity check.   At risk for tumor lysis syndrome:  -Given bulky disease, she is at considerable risk of tumor lysis syndrome. We did not discuss this today, as she was overwhelmed with the new diagnosis and information reviewed today.  Will review at subsequent follow-ups.   -Will start Allopurinol and Acyclovir for prophylaxis with cycle #1 chemotherapy; will ask Nurse Navigator to review at time of chemo teaching as well.  I will plan to see patient on cycle #1 of chemotherapy briefly too. We need to get baseline uric acid before starting allopurinol.  -If any early signs of tumor lysis syndrome appreciated after cycle #1 of chemotherapy, then will add Rasburicase to therapy plan.        Dispo:  -Port-a-cath placement with Arnoldo Morale sometime within the next 1 week.  -ECHO pre-chemo evaluation sometime in next 2 weeks.  -Bone marrow biopsy at [facility] sometime in next 2 weeks.  -Chemo teaching with Nurse Navigator.  -Return to cancer center in mid-May for cycle #1 R-CHOP.  -Follow-up visit with provider 1 week after cycle #1 chemotherapy for toxicity check.     All questions were answered to patient's stated satisfaction. Encouraged patient to call with any new concerns or questions before her next visit to the cancer center and we can certain see her sooner, if needed.     Plan of care discussed with Dr. Talbert Cage, who agrees with the above aforementioned.    Orders placed this encounter:    Orders Placed This Encounter  Procedures  . CT BIOPSY  . CBC with Differential/Platelet  . Comprehensive metabolic panel  . Lactate dehydrogenase  . Uric acid  . Phosphorus  . ECHOCARDIOGRAM COMPLETE      Mike Craze, NP [place] [phone]

## 2017-02-08 ENCOUNTER — Encounter (HOSPITAL_COMMUNITY): Payer: Self-pay | Admitting: Adult Health

## 2017-02-08 ENCOUNTER — Telehealth: Payer: Self-pay | Admitting: Family Medicine

## 2017-02-08 ENCOUNTER — Encounter (HOSPITAL_COMMUNITY): Payer: Medicare Other | Attending: Oncology | Admitting: Adult Health

## 2017-02-08 DIAGNOSIS — C8330 Diffuse large B-cell lymphoma, unspecified site: Secondary | ICD-10-CM | POA: Diagnosis not present

## 2017-02-08 DIAGNOSIS — Z9181 History of falling: Secondary | ICD-10-CM

## 2017-02-08 DIAGNOSIS — R634 Abnormal weight loss: Secondary | ICD-10-CM | POA: Diagnosis not present

## 2017-02-08 DIAGNOSIS — D500 Iron deficiency anemia secondary to blood loss (chronic): Secondary | ICD-10-CM | POA: Insufficient documentation

## 2017-02-08 DIAGNOSIS — R5383 Other fatigue: Secondary | ICD-10-CM | POA: Diagnosis not present

## 2017-02-08 DIAGNOSIS — R630 Anorexia: Secondary | ICD-10-CM

## 2017-02-08 NOTE — Telephone Encounter (Signed)
Faxed to advanced  

## 2017-02-08 NOTE — Patient Instructions (Addendum)
[place] at [facility] Discharge Instructions  RECOMMENDATIONS MADE BY THE CONSULTANT AND ANY TEST RESULTS WILL BE SENT TO YOUR REFERRING PHYSICIAN.  You were seen today by Mike Craze NP. Port insertion with Dr. Arnoldo Morale. Chemo Teaching to be done with Joanne Gavel Nurse Navigator. ECHO ordered, to be scheduled. Bone Marrow Biopsy to be scheduled at [facility]. Chemo to start in about 3 weeks. Return for follow up visit about a week after cycle 1 of chemo.    Thank you for choosing [place] at [facility] to provide your oncology and hematology care.  To afford each patient quality time with our provider, please arrive at least 15 minutes before your scheduled appointment time.    If you have a lab appointment with the [facility] [place] please come in thru the  Main Entrance and check in at the main information desk  You need to re-schedule your appointment should you arrive 10 or more minutes late.  We strive to give you quality time with our providers, and arriving late affects you and other patients whose appointments are after yours.  Also, if you no show three or more times for appointments you may be dismissed from the clinic at the providers discretion.     Again, thank you for choosing [facility].  Our hope is that these requests will decrease the amount of time that you wait before being seen by our physicians.       _____________________________________________________________  Should you have questions after your visit to [facility], please contact our office at [phone] between the hours of 8:30 a.m. and 4:30 p.m.  Voicemails left after 4:30 p.m. will not be returned until the following business day.  For prescription refill requests, have your pharmacy contact our office.       Resources For Cancer Patients and their Caregivers ? American Cancer Society: Can assist with transportation,  wigs, general needs, runs Look Good Feel Better.        315-556-7956 ? Cancer Care: Provides financial assistance, online support groups, medication/co-pay assistance.  1-800-813-HOPE 3525044016) ? [facility] Assists [place] cancer patients and their families through emotional , educational and financial support.  (862)103-9104 ? [place] DSS Where to apply for food stamps, Medicaid and utility assistance. 734 846 1662 ? [facility]: Transportation to medical appointments. 234-137-5658 ? Social Security Administration: May apply for disability if have a Stage IV cancer. (520)164-0393 562 227 8569 ? [employer], Disability and Transit Services: Assists with nutrition, care and transit needs. [place] Support Programs: _0 @ > Cancer Support Group  2nd Tuesday of the month 1pm-2pm, Journey Room  > Creative Journey  3rd Tuesday of the month 1130am-1pm, Journey Room  > Look Good Feel Better  1st Wednesday of the month 10am-12 noon, Journey Room (Call [place] to register [phone])

## 2017-02-08 NOTE — Progress Notes (Signed)
START ON PATHWAY REGIMEN - Lymphoma and CLL     A cycle is every 21 days:     Rituximab      Cyclophosphamide      Doxorubicin      Vincristine      Prednisone   **Always confirm dose/schedule in your pharmacy ordering system**    Patient Characteristics: Diffuse Large B Cell, First Line, Stage I and II, Bulky Disease (10 cm or > 1/3 Diameter of Chest) Disease Type: Not Applicable Disease Type: Diffuse Large B Cell Line of therapy: First Line [place] Stage: II Disease Characteristics: Bulky Disease  Intent of Therapy: Curative Intent, Discussed with Patient

## 2017-02-08 NOTE — Telephone Encounter (Signed)
Patient is requesting a bedside commode.  [facility] has faxed form five days ago and waiting for signature.  Please sign and return to [facility] as patient need of immediately

## 2017-02-10 ENCOUNTER — Encounter (HOSPITAL_COMMUNITY): Payer: Self-pay | Admitting: Emergency Medicine

## 2017-02-10 ENCOUNTER — Other Ambulatory Visit (HOSPITAL_COMMUNITY): Payer: Self-pay | Admitting: Adult Health

## 2017-02-10 ENCOUNTER — Ambulatory Visit (INDEPENDENT_AMBULATORY_CARE_PROVIDER_SITE_OTHER): Payer: Self-pay | Admitting: General Surgery

## 2017-02-10 ENCOUNTER — Encounter: Payer: Self-pay | Admitting: General Surgery

## 2017-02-10 VITALS — BP 151/82 | HR 92 | Temp 98.4°F | Resp 18 | Ht 66.0 in | Wt 150.0 lb

## 2017-02-10 DIAGNOSIS — C8330 Diffuse large B-cell lymphoma, unspecified site: Secondary | ICD-10-CM

## 2017-02-10 MED ORDER — ONDANSETRON HCL 8 MG PO TABS: 8.0000 mg | ORAL_TABLET | Freq: Two times a day (BID) | ORAL | 1 refills | Status: DC | PRN

## 2017-02-10 MED ORDER — ACYCLOVIR 400 MG PO TABS: 400.0000 mg | ORAL_TABLET | Freq: Two times a day (BID) | ORAL | 2 refills | Status: DC

## 2017-02-10 MED ORDER — PREDNISONE 20 MG PO TABS: ORAL_TABLET | ORAL | 6 refills | Status: DC

## 2017-02-10 MED ORDER — ALLOPURINOL 300 MG PO TABS: 300.0000 mg | ORAL_TABLET | Freq: Every day | ORAL | 3 refills | Status: DC

## 2017-02-10 MED ORDER — LIDOCAINE-PRILOCAINE 2.5-2.5 % EX CREA: TOPICAL_CREAM | CUTANEOUS | 3 refills | Status: DC

## 2017-02-10 MED ORDER — PROCHLORPERAZINE MALEATE 10 MG PO TABS: 10.0000 mg | ORAL_TABLET | Freq: Four times a day (QID) | ORAL | 6 refills | Status: DC | PRN

## 2017-02-10 NOTE — Patient Instructions (Addendum)
[place]   CHEMOTHERAPY INSTRUCTIONS  You have been diagnosed with diffuse large b-cell lymphoma.  We are going to treat you with a combination of drugs called RCHOP.  These include cytoxan, adriamycin, vincristine, neulasta and rituxan.  You will receive this chemotherapy every 21 days.  21 days is 1 cycle.  You will have 6 cycles.  This is with curative intent.   You will see the doctor regularly throughout treatment.  We monitor your lab work prior to every treatment.  The doctor monitors your response to treatment by the way you are feeling, your blood work, and scans periodically.  You will receive premedications prior to chemotherapy.  These include: Premeds: Tylenol and Benadryl:  Given to help prevent any type of reaction to chemotherapy.  Aloxi - high powered nausea/vomiting prevention medication used for chemotherapy patients. Dexamethasone - steroid - given to reduce the risk of you having an allergic type reaction to the chemotherapy. Dex can cause you to feel energized, nervous/anxious/jittery, make you have trouble sleeping, and/or make you feel hot/flushed in the face/neck and/or look pink/red in the face/neck. These side effects will pass as the Dex wears off. (takes 20 minutes to infuse)   Neulasta - this medication is not chemo but being given because you have had chemo. It is usually given 24-27 hours after the completion of chemotherapy. This medication works by boosting your bone marrow's supply of white blood cells. White blood cells are what protect our bodies against infection. The medication is given in the form of a subcutaneous injection. It is given in the fatty tissue of your abdomen. It is a short needle. The major side effect of this medication is bone or muscle pain. The drug of choice to relieve or lessen the pain is Aleve or Ibuprofen. If a physician has ever told you not to take Aleve or Ibuprofen - then don't take it. You should then  take Tylenol/acetaminophen. Take either medication as the bottle directs you to.  The level of pain you experience as a result of this injection can range from none, to mild or moderate, or severe. Please let us know if you develop moderate or severe bone pain.   You can take Claritin 10 mg over the counter for a few days after receiving neulasta to help with the bone aches and pains.    **DO NOT expose the Neulasta On-body injector to diagnostic imaging (CT scans, MRI, Ultrasound, X-ray), radiation treatment, or oxygen rich environments, such as hyperbaric chambers. **    POTENTIAL SIDE EFFECTS OF TREATMENT: Your Chemotherapy Regimen: R-CHOP Your Oncologist: Dr. [facility] Contact Information: [phone]  Description of your regimen: You will be receiving 5 drugs as part of your chemotherapy regimen.  These drugs include rituximab (ri TUX i mab), doxorubicin (dox oh ROO bi sin), vincristine (vin KRIS teen), cyclophosphamide (sye kloe FOSS fa mide), and prednisone (PRED ni sone). Rituximab (Rituxan) is a monoclonal antibody. Monoclonal antibodies are used to target some types of cancer cells while causing as little harm as possible to normal cells. Cyclophosphamide (Cytoxan), doxorubicin (Adriamycin), and vincristine (Oncovin) are chemotherapy drugs. Prednisone is a steroid. These medicines may be used for other purposes; ask your health care provider, pharmacist, or nurse if you have questions.   Regimen Schedule: Your doctor may use the word 'regimen' (e.g. the R-CHOP regimen) when talking about your chemotherapy. This means the whole plan or schedule of your particular chemotherapy treatment.  You will only receive chemotherapy for one day with this regimen, but will continue the steroid, prednisone, for 5 days.  Day 1: Rituximab, doxorubicin, vincristine, cyclophosphamide, prednisone Day 2: Prednisone Day 3: Prednisone Day 4: Prednisone Day 5: Prednisone  After  this, you'll have a rest with no treatment for 16 days. This completes what is called a cycle of treatment. Each cycle takes 21 days (three weeks).   How you will receive your regimen:  Rituximab, doxorubicin, vincristine, and cyclophosphamide are given into a vein. You will receive these drugs through your IV line/port-a-cath. These drugs may be administered in a hospital or at the outpatient infusion clinic by a specially trained health care professional. You will take the prednisone by mouth.   Common side effects of your regimen:  Each person's reaction to chemotherapy is different. Some people have very few side effects, while others may experience more. The side effects described here won't affect everyone receiving R-CHOP.   Marland Kitchen Allergic reaction o Some patients may experience an allergic reactions to rituximab o Signs of an allergic reaction may include shortness of breath, chills, headaches, fevers, feeling sick, skin rashes, itching, and/or pain in your back, stomach or chest o This reaction is more common with the first infusion. Your first infusion will be given slowly over a number of hours. You will also be given medications do decrease the changes of a reaction occurring o Please notify your nurse or doctor if you experience any of these symptoms during or after the infusion  . Low blood pressure o There is a possibility that your blood pressure may drop during your rituximab infusion o If you are taking blood pressure medication, your doctor may hold the medication the morning of your treatment o Please talk with your doctor if you have any concerns  . Low blood cell counts  o Low red blood cell counts - You may also hear this referred to as "anemia" - Red blood cells carry oxygen around the body - Low red blood cell counts may make you feel tired or breathless - If your red blood cell count becomes too low, you may have to receive a blood transfusion   o Low white blood cell  counts - You may also hear this referred to as "neutropenia" - White blood cells help fight infection - Low white blood cell counts may make you more prone to infection - Your white blood cell count is usually at its lowest 10-14 days after chemotherapy  o Low platelet counts - You may also hear this referred to as "thrombocytopenia" - Platelets help your blood clot - Low platelet counts may make you more prone to bruising and bleeding    . Nausea and vomiting o Your doctor has likely already prescribed very effective drugs to help prevent nausea and vomiting from occurring while you are receiving chemotherapy. If you experience nausea and/or vomiting, let your doctor or nurse know.  . Constipation o Having less frequent bowel movements, difficulty passing stool, or having hard, dry stools o To prevent constipation drink plenty of fluids, eat more fiber, and try to stay active o Your doctor may also prescribe medications to help prevent constipation  . Tiredness (fatigue)  o Feeling tired is a common side effect of chemotherapy  o You may begin to feel tired right away or begin to feel more tired as you near the end of your treatment o Tiredness may last even after your treatment is over  . Taste  Changes o Food may begin to taste different or have a metallic taste o A dietician or nurse may be able to help you manage this side effect  . Hair loss  o This typically occurs 2-3 weeks after chemotherapy begins. Hair usually falls completely out. Almost always, your hair will grow back 2-3 months after chemotherapy is over.   . Bladder Irritation o Cyclophosphamide may irritate your bladder. Be sure to drink plenty of fluids (at least 2 liters = 8 cups!) for the 24 hours after treatment to help prevent bladder irritation. o Doxorubicin is red and your urine may appear a pinkish red color. This typically lasts for 48 hours after treatment. o Notify your doctor if you have trouble  urinating or if you see blood in your urine.  . Numbness or tingling of the hands and feet o You may notice that you have difficulty buttoning up buttons or doing other similar tasks o This usually improves slowly, sometimes months after treatment is finished o Notify your doctor if you experience numbness or tingling of your hands and feet   . Irritation of the stomach  o Steroids, like prednisone, can irritate your stomach o Take prednisone with food to help prevent stomach irritation  . Appetite changes and weight gain o Steroids, like prednisone, can increase your appetite and also cause you to gain weight  . Raised blood sugars o Steroids, like prednisone, can increase your blood sugars o Notify your doctor if you get very thirsty or having to go to the bathroom more than usual  * This is NOT a comprehensive list of side effects. We have outlined the most common side effects but haven't included other less common side effects. If you notice any side effects that aren't listed here, discuss them with your doctor, chemotherapy nurse or pharmacist. You may report side effects to FDA at 1-800-FDA-1088.   Other medications that you may be receiving to help support you while you are receiving chemotherapy: . Medications to decrease the chance of an allergic reaction . Medications to prevent nausea and vomiting . Fluids to help prevent dehydration . Medications to help your white blood cell count from going too low   Please alert [place] if: . You have any blood disorders  . You have hearing problems  . You have kidney disease  . You have recently received or receiving ongoing radiation therapy  . You have any allergies (including foods, drugs, preservatives, etc.)  . You are pregnant or trying to get pregnant  . You are breast-feeding . You are taking any medications (including all herbals, topicals, eye drops, etc.) In the hospital, a pharmacy medication reconciliation technician has  likely already been by to obtain a list of medications you are taking. If this has not occurred or you remember something else you are taking, please notify your nurse.      When you leave the hospital: The situations listed below are considered emergencies. If you experience any of these you should contact your doctor immediately. . Fever of 100.5 or higher . Nausea and vomiting of coffee colored material or large volumes of blood . Black, or very bloody bowel movements . Severe and persistent chest pain . Shortness of breath (when you cannot catch your breath) . Nausea or vomiting that prevents you from keeping any fluids or medication in your stomach . Severe headache, severe confusion, seizures or symptoms of a stroke such as weakness on one side of your body or weakness  of both legs . Difficulty walking  . Difficulty urinating     NOTE: This sheet is a summary. It may not cover all possible information. If you have questions about your medicines, talk to your doctor, pharmacist, or nurse.      SELF CARE ACTIVITIES WHILE ON CHEMOTHERAPY: Hydration Increase your fluid intake 48 hours prior to treatment and drink at least 8 to 12 cups (64 ounces) of water/decaff beverages per day after treatment. You can still have your cup of coffee or soda but these beverages do not count as part of your 8 to 12 cups that you need to drink daily. No alcohol intake.  Medications Continue taking your normal prescription medication as prescribed.  If you start any new herbal or new supplements please let us know first to make sure it is safe.  Mouth Care Have teeth cleaned professionally before starting treatment. Keep dentures and partial plates clean. Use soft toothbrush and do not use mouthwashes that contain alcohol. Biotene is a good mouthwash that is available at most pharmacies or may be ordered by calling [phone]. Use warm salt water gargles (1 teaspoon salt per 1 quart warm water)  before and after meals and at bedtime. Or you may rinse with 2 tablespoons of three-percent hydrogen peroxide mixed in eight ounces of water. If you are still having problems with your mouth or sores in your mouth please call the clinic. If you need dental work, please let the doctor know before you go for your appointment so that we can coordinate the best possible time for you in regards to your chemo regimen. You need to also let your dentist know that you are actively taking chemo. We may need to do labs prior to your dental appointment.   Skin Care Always use sunscreen that has not expired and with SPF (Sun Protection Factor) of 50 or higher. Wear hats to protect your head from the sun. Remember to use sunscreen on your hands, ears, face, & feet.  Use good moisturizing lotions such as udder cream, eucerin, or even Vaseline. Some chemotherapies can cause dry skin, color changes in your skin and nails.    . Avoid long, hot showers or baths. . Use gentle, fragrance-free soaps and laundry detergent. . Use moisturizers, preferably creams or ointments rather than lotions because the thicker consistency is better at preventing skin dehydration. Apply the cream or ointment within 15 minutes of showering. Reapply moisturizer at night, and moisturize your hands every time after you wash them.  Hair Loss (if your doctor says your hair will fall out)  . If your doctor says that your hair is likely to fall out, decide before you begin chemo whether you want to wear a wig. You may want to shop before treatment to match your hair color. . Hats, turbans, and scarves can also camouflage hair loss, although some people prefer to leave their heads uncovered. If you go bare-headed outdoors, be sure to use sunscreen on your scalp. . Cut your hair short. It eases the inconvenience of shedding lots of hair, but it also can reduce the emotional impact of watching your hair fall out. . Don't perm or color your hair  during chemotherapy. Those chemical treatments are already damaging to hair and can enhance hair loss. Once your chemo treatments are done and your hair has grown back, it's OK to resume dyeing or perming hair. With chemotherapy, hair loss is almost always temporary. But when it grows back, it may  be a different color or texture. In older adults who still had hair color before chemotherapy, the new growth may be completely gray.  Often, new hair is very fine and soft.  Infection Prevention Please wash your hands for at least 30 seconds using warm soapy water. Handwashing is the #1 way to prevent the spread of germs. Stay away from sick people or people who are getting over a cold. If you develop respiratory systems such as green/yellow mucus production or productive cough or persistent cough let us know and we will see if you need an antibiotic. It is a good idea to keep a pair of gloves on when going into grocery stores/[employer] to decrease your risk of coming into contact with germs on the carts, etc. Carry alcohol hand gel with you at all times and use it frequently if out in public. If your temperature reaches 100.5 or higher please call the clinic and let us know.  If it is after hours or on the weekend please go to the ER if your temperature is over 100.5.  Please have your own personal thermometer at home to use.    Sex and bodily fluids If you are going to have sex, a condom must be used to protect the person that isn't taking chemotherapy. Chemo can decrease your libido (sex drive). For a few days after chemotherapy, chemotherapy can be excreted through your bodily fluids.  When using the toilet please close the lid and flush the toilet twice.  Do this for a few day after you have had chemotherapy.    Effects of chemotherapy on your sex life Some changes are simple and won't last long. They won't affect your sex life permanently. Sometimes you may feel: . too tired . not strong enough to be  very active . sick or sore  . not in the mood . anxious or low Your anxiety might not seem related to sex. For example, you may be worried about the cancer and how your treatment is going. Or you may be worried about money, or about how you family are coping with your illness. These things can cause stress, which can affect your interest in sex. It's important to talk to your partner about how you feel. Remember - the changes to your sex life don't usually last long. There's usually no medical reason to stop having sex during chemo. The drugs won't have any long term physical effects on your performance or enjoyment of sex. Cancer can't be passed on to your partner during sex  Contraception It's important to use reliable contraception during treatment. Avoid getting pregnant while you or your partner are having chemotherapy. This is because the drugs may harm the baby. Sometimes chemotherapy drugs can leave a man or woman infertile.  This means you would not be able to have children in the future. You might want to talk to someone about permanent infertility. It can be very difficult to learn that you may no longer be able to have children. Some people find counselling helpful. There might be ways to preserve your fertility, although this is easier for men than for women. You may want to speak to a fertility expert. You can talk about sperm banking or harvesting your eggs. You can also ask about other fertility options, such as donor eggs. If you have or have had breast cancer, your doctor might advise you not to take the contraceptive pill. This is because the hormones in it might affect the cancer.  It is not known for sure whether or not chemotherapy drugs can be passed on through semen or secretions from the vagina. Because of this some doctors advise people to use a barrier method if you have sex during treatment. This applies to vaginal, anal or oral sex. Generally, doctors advise a barrier method  only for the time you are actually having the treatment and for about a week after your treatment. Advice like this can be worrying, but this does not mean that you have to avoid being intimate with your partner. You can still have close contact with your partner and continue to enjoy sex.  Animals If you have cats or birds we just ask that you not change the litter or change the cage.  Please have someone else do this for you while you are on chemotherapy.   Food Safety During and After Cancer Treatment Food safety is important for people both during and after cancer treatment. Cancer and cancer treatments, such as chemotherapy, radiation therapy, and stem cell/bone marrow transplantation, often weaken the immune system. This makes it harder for your body to protect itself from foodborne illness, also called food poisoning. Foodborne illness is caused by eating food that contains harmful bacteria, parasites, or viruses.  Foods to avoid Some foods have a higher risk of becoming tainted with bacteria. These include: Marland Kitchen Unwashed fresh fruit and vegetables, especially leafy vegetables that can hide dirt and other contaminants . Raw sprouts, such as alfalfa sprouts . Raw or undercooked beef, especially ground beef, or other raw or undercooked meat and poultry . Fatty, fried, or spicy foods immediately before or after treatment.  These can sit heavy on your stomach and make you feel nauseous. . Raw or undercooked shellfish, such as oysters. . Sushi and sashimi, which often contain raw fish.  . Unpasteurized beverages, such as unpasteurized fruit juices, raw milk, raw yogurt, or cider . Undercooked eggs, such as soft boiled, over easy, and poached; raw, unpasteurized eggs; or foods made with raw egg, such as homemade raw cookie dough and homemade mayonnaise Simple steps for food safety Shop smart. . Do not buy food stored or displayed in an unclean area. . Do not buy bruised or damaged fruits or  vegetables. . Do not buy cans that have cracks, dents, or bulges. . Pick up foods that can spoil at the end of your shopping trip and store them in a cooler on the way home. Prepare and clean up foods carefully. . Rinse all fresh fruits and vegetables under running water, and dry them with a clean towel or paper towel. . Clean the top of cans before opening them. . After preparing food, wash your hands for 20 seconds with hot water and soap. Pay special attention to areas between fingers and under nails. . Clean your utensils and dishes with hot water and soap. Marland Kitchen Disinfect your kitchen and cutting boards using 1 teaspoon of liquid, unscented bleach mixed into 1 quart of water.   Dispose of old food. . Eat canned and packaged food before its expiration date (the "use by" or "best before" date). . Consume refrigerated leftovers within 3 to 4 days. After that time, throw out the food. Even if the food does not smell or look spoiled, it still may be unsafe. Some bacteria, such as Listeria, can grow even on foods stored in the refrigerator if they are kept for too long. Take precautions when eating out. . At restaurants, avoid buffets and salad bars where  food sits out for a long time and comes in contact with many people. Food can become contaminated when someone with a virus, often a norovirus, or another "bug" handles it. . Put any leftover food in a "to-go" container yourself, rather than having the server do it. And, refrigerate leftovers as soon as you get home. . Choose restaurants that are clean and that are willing to prepare your food as you order it cooked.     MEDICATIONS: Allopurinol 300 mg tablets:  Take 1 tablet (300 mg total) by mouth daily.  (help protect your kidneys)  Acyclovir 400 mg tablets:  Take 1 tablet (400 mg total) by mouth 2 (two) times daily. (help prevent shingles outbreak)  Prednisone 20 mg tablets:  Take 3.5 tabs (70 mg) by mouth in the morning with breakfast on  Days 1-5 of chemotherapy.   Zofran/Ondansetron 8mg  tablet. Take 1 tablet every 8 hours as needed for nausea/vomiting. (#1 nausea med to take, this can constipate)  Compazine/Prochlorperazine 10mg  tablet. Take 1 tablet every 6 hours as needed for nausea/vomiting. (#2 nausea med to take, this can make you sleepy)   EMLA cream. Apply a quarter size amount to port site 1 hour prior to chemo. Do not rub in. Cover with plastic wrap.   Over-the-Counter Meds:  Miralax 1 capful in 8 oz of fluid daily. May increase to two times a day if needed. This is a stool softener. If this doesn't work proceed you can add:  Senokot S-start with 1 tablet two times a day and increase to 4 tablets two times a day if needed. (total of 8 tablets in a 24 hour period). This is a stimulant laxative.   Call us if this does not help your bowels move.   Imodium 2mg  capsule. Take 2 capsules after the 1st loose stool and then 1 capsule every 2 hours until you go a total of 12 hours without having a loose stool. Call the [place] if loose stools continue. If diarrhea occurs @ bedtime, take 2 capsules @ bedtime. Then take 2 capsules every 4 hours until morning. Call [place].     Constipation Sheet *Miralax in 8 oz of fluid daily.  May increase to two times a day if needed.  This is a stool softener.  If this not enough to keep your bowel regular:  You can add:  *Senokot S, start with one tablet twice a day and can increase to 4 tablets twice a day if needed.  This is a stimulant laxative.   Sometimes when you take pain medication you need BOTH a medicine to keep your stool soft and a medicine to help your bowel push it out!  Please call if the above does not work for you.   Do not go more than 2 days without a bowel movement.  It is very important that you do not become constipated.  It will make you feel sick to your stomach (nausea) and can cause abdominal pain and vomiting.    Diarrhea Sheet  If  you are having loose stools/diarrhea, please purchase Imodium and begin taking as outlined:  At the first sign of poorly formed or loose stools you should begin taking Imodium(loperamide) 2 mg capsules.  Take two caplets (4mg ) followed by one caplet (2mg ) every 2 hours until you have had no diarrhea for 12 hours.  During the night take two caplets (4mg ) at bedtime and continue every 4 hours during the night until the morning.  Stop  taking Imodium only after there is no sign of diarrhea for 12 hours.    Always call the [facility] if you are having loose stools/diarrhea that you can't get under control.  Loose stools/disrrhea leads to dehydration (loss of water) in your body.  We have other options of trying to get the loose stools/diarrhea to stopped but you must let us know!     Nausea Sheet  Zofran/Ondansetron 8mg  tablet. Take 1 tablet every 8 hours as needed for nausea/vomiting. (#1 nausea med to take, this can constipate)  Compazine/Prochlorperazine 10mg  tablet. Take 1 tablet every 6 hours as needed for nausea/vomiting. (#2 nausea med to take, this can make you sleepy)  You can take these medications together or separately.  We would first like for you to try the Ondansetron by itself and then take the Prochloperizine if needed. But you are allowed to take both medications at the same time if your nausea is that severe.  If you are having persistent nausea (nausea that does not stop) please take these medications on a staggered schedule so that the nausea medication stays in your body.  Please call the [facility] and let us know the amount of nausea that you are experiencing.  If you begin to vomit, you need to call the [facility] and if it is the weekend and you have vomited more than one time and cant get it to stop-go to the Emergency Room.  Persistent nausea/vomiting can lead to dehydration (loss of fluid in your body) and will make you feel terrible.   Ice chips, sips of clear liquids,  foods that are @ room temperature, crackers, and toast tend to be better tolerated.     SYMPTOMS TO REPORT AS SOON AS POSSIBLE AFTER TREATMENT:  FEVER GREATER THAN 100.5 F  CHILLS WITH OR WITHOUT FEVER  NAUSEA AND VOMITING THAT IS NOT CONTROLLED WITH YOUR NAUSEA MEDICATION  UNUSUAL SHORTNESS OF BREATH  UNUSUAL BRUISING OR BLEEDING  TENDERNESS IN MOUTH AND THROAT WITH OR WITHOUT PRESENCE OF ULCERS  URINARY PROBLEMS  BOWEL PROBLEMS  UNUSUAL RASH    Wear comfortable clothing and clothing appropriate for easy access to any Portacath or PICC line. Let us know if there is anything that we can do to make your therapy better!   What to do if you need assistance after hours or on the weekends: CALL [phone].  HOLD on the line, do not hang up.  You will hear multiple messages but at the end you will be connected with a nurse triage line.  They will contact the doctor if necessary.  Most of the time they will be able to assist you.  Do not call the hospital operator.      I have been informed and understand all of the instructions given to me and have received a copy. I have been instructed to call the clinic [phone] or my family physician as soon as possible for continued medical care, if indicated. I do not have any more questions at this time but understand that I may call the [facility] or the Patient Navigator at [phone] during office hours should I have questions or need assistance in obtaining follow-up care.

## 2017-02-10 NOTE — H&P (Signed)
[name] is an 83 y.o. female.   Chief Complaint:   B cell lymphoma, need for central venous access HPI:  Patient is an 83 year old black female who was recently diagnosed with B-cell lymphoma.  She now presents for Port-A-Cath insertion.  Past Medical History:  Diagnosis Date  . Arthritis of knee    bilateral    . Chronic renal insufficiency   . Diabetes mellitus, type 2 ([place])   . GERD (gastroesophageal reflux disease)   . History of kidney stones   . Hypertension   . Iron deficiency anemia 12/28/2016  . Lymphadenopathy, abdominal 12/28/2016  . Osteopenia   . PAD (peripheral artery disease) ([place])   . Personal history of colonic polyps 3 years ago    colonoscopy     Past Surgical History:  Procedure Laterality Date  . BIOPSY  12/16/2016   Procedure: BIOPSY;  Surgeon: Rogene Houston, MD;  [facility];  Service: Endoscopy;;  gastric polyp  . bunionectomy bilateral  feet    . COLONOSCOPY N/A 12/16/2016   Procedure: COLONOSCOPY;  Surgeon: Rogene Houston, MD;  Location[facility];  Service: Endoscopy;  Laterality: N/A;  2:15  . DENTAL SURGERY    . ESOPHAGOGASTRODUODENOSCOPY N/A 12/16/2016   Procedure: ESOPHAGOGASTRODUODENOSCOPY (EGD);  Surgeon: Rogene Houston, MD;  [facility];  Service: Endoscopy;  Laterality: N/A;  . gum disease  2010   treated by Dr. Andree Elk   . LAPAROTOMY N/A 01/31/2017   Procedure: EXPLORATORY LAPAROTOMY;  Surgeon: Aviva Signs, MD;  Location: [facility];  Service: General;  Laterality: N/A;  . Left knee manipulation  under anasthesia due to contracture -  03/08/08   Dr. Aline Brochure   . left knee replacement  08/14/08   Dr. Aline Brochure   . LYMPH NODE BIOPSY  01/31/2017   Procedure: LYMPH NODE BIOPSY;  Surgeon: Aviva Signs, MD;  Location: [facility];  Service: General;;  . TOTAL ABDOMINAL HYSTERECTOMY     bleeding   . TOTAL KNEE ARTHROPLASTY Right 05/12/2016   Procedure: TOTAL KNEE ARTHROPLASTY;  Surgeon: [name], MD;  Location: [facility];   Service: Orthopedics;  Laterality: Right;  . WISDOM TOOTH EXTRACTION      Family History  Problem Relation Age of Onset  . Colon cancer Father   . Cancer Mother   . Stroke Mother   . Hypertension Brother    Social History:  reports that she quit smoking about 17 years ago. She has a 7.50 pack-year smoking history. She has quit using smokeless tobacco. She reports that she does not drink alcohol or use drugs.  Allergies:  Allergies  Allergen Reactions  . Codeine Hives  . Fosamax [Alendronate Sodium] Other (See Comments)    Pt reports excessive urination on the day she took the pill    No prescriptions prior to admission.    No results found for this or any previous visit (from the past 48 hour(s)). No results found.  Review of Systems  Constitutional: Positive for malaise/fatigue.  HENT: Negative.   Eyes: Negative.   Respiratory: Negative.   Cardiovascular: Negative.   Gastrointestinal: Negative.   Genitourinary: Negative.   Musculoskeletal: Negative.   Skin: Negative.   Neurological: Negative.   Endo/Heme/Allergies: Negative.   Psychiatric/Behavioral: Negative.     There were no vitals taken for this visit. Physical Exam  Vitals reviewed. Constitutional: She is oriented to person, place, and time. She appears well-developed and well-nourished.  HENT:  Head: Normocephalic and atraumatic.  Neck: Normal range of motion. Neck supple.  Cardiovascular: Normal rate, regular rhythm and normal heart sounds.   No murmur heard. Respiratory: Effort normal and breath sounds normal. She has no wheezes.  Neurological: She is alert and oriented to person, place, and time.  Skin: Skin is warm and dry.     Assessment/Plan   B cell lymphoma, need for central venous access   patient scheduled for Port-A-Cath insertion on 02/21/2017.  Risks and benefits of the procedure including bleeding, infection, and pneumothorax were fully explained to the patient, who gave informed  consent.  Aviva Signs, MD 02/10/2017, 9:07 PM

## 2017-02-10 NOTE — Progress Notes (Signed)
Subjective:     [name]    Status post mesenteric lymph node biopsy.  Biopsy was positive for lymphoma.  Patient has no complaints. Objective:    BP (!) 151/82   Pulse 92   Temp 98.4 F (36.9 C)   Resp 18   Ht 5\' 6"  (1.676 m)   Wt 150 lb (68 kg)   BMI 24.21 kg/m   General:  alert, cooperative and no distress    Abdomen soft, incision healing well.  Staples removed, Steri-Strips applied.     Assessment:    Doing well postoperatively.    Plan:    Oncology has requested a Port-A-Cath be inserted.  Patient scheduled for Port-A-Cath insertion on 02/21/2017.  Risks and benefits of the procedure including bleeding, infection, and pneumothorax were fully explained to the patient, who gave informed consent.

## 2017-02-10 NOTE — Patient Instructions (Signed)
Implanted Port Insertion Implanted port insertion is a procedure to put in a port and catheter. The port is a device with an injectable disk that can be accessed by your health care provider. The port is connected to a vein in the chest or neck by a small flexible tube (catheter). There are different types of ports. The implanted port may be used as a long-term IV access for:  Medicines, such as chemotherapy.  Fluids.  Liquid nutrition, such as total parenteral nutrition (TPN).  Blood samples.  Having a port means that your health care provider will not need to use the veins in your arms for these procedures. Tell a health care provider about:  Any allergies you have.  All medicines you are taking, especially blood thinners, as well as any vitamins, herbs, eye drops, creams, over-the-counter medicines, and steroids.  Any problems you or family members have had with anesthetic medicines.  Any blood disorders you have.  Any surgeries you have had.  Any medical conditions you have, including diabetes or kidney problems.  Whether you are pregnant or may be pregnant. What are the risks? Generally, this is a safe procedure. However, problems may occur, including:  Allergic reactions to medicines or dyes.  Damage to other structures or organs.  Infection.  Damage to the blood vessel, bruising, or bleeding at the puncture site.  Blood clot.  Breakdown of the skin over the port.  A collection of air in the chest that can cause one of the lungs to collapse (pneumothorax). This is rare.  What happens before the procedure? Staying hydrated Follow instructions from your health care provider about hydration, which may include:  Up to 2 hours before the procedure - you may continue to drink clear liquids, such as water, clear fruit juice, black coffee, and plain tea.  Eating and drinking restrictions  Follow instructions from your health care provider about eating and drinking,  which may include: ? 8 hours before the procedure - stop eating heavy meals or foods such as meat, fried foods, or fatty foods. ? 6 hours before the procedure - stop eating light meals or foods, such as toast or cereal. ? 6 hours before the procedure - stop drinking milk or drinks that contain milk. ? 2 hours before the procedure - stop drinking clear liquids. Medicines  Ask your health care provider about: ? Changing or stopping your regular medicines. This is especially important if you are taking diabetes medicines or blood thinners. ? Taking medicines such as aspirin and ibuprofen. These medicines can thin your blood. Do not take these medicines before your procedure if your health care provider instructs you not to.  You may be given antibiotic medicine to help prevent infection. General instructions  Plan to have someone take you home from the hospital or clinic.  If you will be going home right after the procedure, plan to have someone with you for 24 hours.  You may have blood tests.  You may be asked to shower with a germ-killing soap. What happens during the procedure?  To lower your risk of infection: ? Your health care team will wash or sanitize their hands. ? Your skin will be washed with soap. ? Hair may be removed from the surgical area.  An IV tube will be inserted into one of your veins.  You will be given one or more of the following: ? A medicine to help you relax (sedative). ? A medicine to numb the area (local anesthetic).    Two small cuts (incisions) will be made to insert the port. ? One incision will be made in your neck to get access to the vein where the catheter will lie. ? The other incision will be made in the upper chest. This is where the port will lie.  The procedure may be done using continuous X-ray (fluoroscopy) or other imaging tools for guidance.  The port and catheter will be placed. There may be a small, raised area where the port  is.  The port will be flushed with a salt solution (saline), and blood will be drawn to make sure that it is working correctly.  The incisions will be closed.  Bandages (dressings) may be placed over the incisions. The procedure may vary among health care providers and hospitals. What happens after the procedure?  Your blood pressure, heart rate, breathing rate, and blood oxygen level will be monitored until the medicines you were given have worn off.  Do not drive for 24 hours if you were given a sedative.  You will be given a manufacturer's information card for the type of port that you have. Keep this with you.  Your port will need to be flushed and checked as told by your health care provider, usually every few weeks.  A chest X-ray will be done to: ? Check the placement of the port. ? Make sure there is no injury to your lung. Summary  Implanted port insertion is a procedure to put in a port and catheter.  The implanted port is used as a long-term IV access.  The port will need to be flushed and checked as told by your health care provider, usually every few weeks.  Keep your manufacturer's information card with you at all times. This information is not intended to replace advice given to you by your health care provider. Make sure you discuss any questions you have with your health care provider. Document Released: 07/25/2013 Document Revised: 08/25/2016 Document Reviewed: 08/25/2016 Elsevier Interactive Patient Education  2017 [employer] Implanted Port Home Guide An implanted port is a type of central line that is placed under the skin. Central lines are used to provide IV access when treatment or nutrition needs to be given through a person's veins. Implanted ports are used for long-term IV access. An implanted port may be placed because:  You need IV medicine that would be irritating to the small veins in your hands or arms.  You need long-term IV medicines, such as  antibiotics.  You need IV nutrition for a long period.  You need frequent blood draws for lab tests.  You need dialysis.  Implanted ports are usually placed in the chest area, but they can also be placed in the upper arm, the abdomen, or the leg. An implanted port has two main parts:  Reservoir. The reservoir is round and will appear as a small, raised area under your skin. The reservoir is the part where a needle is inserted to give medicines or draw blood.  Catheter. The catheter is a thin, flexible tube that extends from the reservoir. The catheter is placed into a large vein. Medicine that is inserted into the reservoir goes into the catheter and then into the vein.  How will I care for my incision site? Do not get the incision site wet. Bathe or shower as directed by your health care provider. How is my port accessed? Special steps must be taken to access the port:  Before the port is accessed,   a numbing cream can be placed on the skin. This helps numb the skin over the port site.  Your health care provider uses a sterile technique to access the port. ? Your health care provider must put on a mask and sterile gloves. ? The skin over your port is cleaned carefully with an antiseptic and allowed to dry. ? The port is gently pinched between sterile gloves, and a needle is inserted into the port.  Only "non-coring" port needles should be used to access the port. Once the port is accessed, a blood return should be checked. This helps ensure that the port is in the vein and is not clogged.  If your port needs to remain accessed for a constant infusion, a clear (transparent) bandage will be placed over the needle site. The bandage and needle will need to be changed every week, or as directed by your health care provider.  Keep the bandage covering the needle clean and dry. Do not get it wet. Follow your health care provider's instructions on how to take a shower or bath while the port is  accessed.  If your port does not need to stay accessed, no bandage is needed over the port.  What is flushing? Flushing helps keep the port from getting clogged. Follow your health care provider's instructions on how and when to flush the port. Ports are usually flushed with saline solution or a medicine called heparin. The need for flushing will depend on how the port is used.  If the port is used for intermittent medicines or blood draws, the port will need to be flushed: ? After medicines have been given. ? After blood has been drawn. ? As part of routine maintenance.  If a constant infusion is running, the port may not need to be flushed.  How long will my port stay implanted? The port can stay in for as long as your health care provider thinks it is needed. When it is time for the port to come out, surgery will be done to remove it. The procedure is similar to the one performed when the port was put in. When should I seek immediate medical care? When you have an implanted port, you should seek immediate medical care if:  You notice a bad smell coming from the incision site.  You have swelling, redness, or drainage at the incision site.  You have more swelling or pain at the port site or the surrounding area.  You have a fever that is not controlled with medicine.  This information is not intended to replace advice given to you by your health care provider. Make sure you discuss any questions you have with your health care provider. Document Released: 10/04/2005 Document Revised: 03/11/2016 Document Reviewed: 06/11/2013 Elsevier Interactive Patient Education  2017 Else[employer] Inc.  

## 2017-02-10 NOTE — Progress Notes (Signed)
chemotherapy teaching pulled together and appts made.    Called and spoke with daughter Davy Pique to let her know that I called in prescriptions to the pharmacy to bring them with her Monday to the teaching and we will go over all of them and we need to get baseline lab work on Monday before the echo.

## 2017-02-14 ENCOUNTER — Encounter (HOSPITAL_COMMUNITY): Payer: Medicare Other

## 2017-02-14 ENCOUNTER — Ambulatory Visit (HOSPITAL_COMMUNITY)
Admission: RE | Admit: 2017-02-14 | Discharge: 2017-02-14 | Disposition: A | Discharge status: Home / Self Care | Payer: Medicare Other | Source: Ambulatory Visit | Attending: Adult Health | Admitting: Adult Health

## 2017-02-14 DIAGNOSIS — C8330 Diffuse large B-cell lymphoma, unspecified site: Secondary | ICD-10-CM | POA: Insufficient documentation

## 2017-02-14 DIAGNOSIS — I351 Nonrheumatic aortic (valve) insufficiency: Secondary | ICD-10-CM | POA: Insufficient documentation

## 2017-02-14 DIAGNOSIS — I071 Rheumatic tricuspid insufficiency: Secondary | ICD-10-CM | POA: Insufficient documentation

## 2017-02-14 DIAGNOSIS — E119 Type 2 diabetes mellitus without complications: Secondary | ICD-10-CM | POA: Insufficient documentation

## 2017-02-14 DIAGNOSIS — I340 Nonrheumatic mitral (valve) insufficiency: Secondary | ICD-10-CM | POA: Insufficient documentation

## 2017-02-14 DIAGNOSIS — D500 Iron deficiency anemia secondary to blood loss (chronic): Secondary | ICD-10-CM | POA: Diagnosis present

## 2017-02-14 DIAGNOSIS — Z79899 Other long term (current) drug therapy: Secondary | ICD-10-CM | POA: Diagnosis present

## 2017-02-14 DIAGNOSIS — I10 Essential (primary) hypertension: Secondary | ICD-10-CM | POA: Insufficient documentation

## 2017-02-14 LAB — ECHOCARDIOGRAM COMPLETE
CHL CUP DOP CALC LVOT VTI: 19.2 cm
CHL CUP MV DEC (S): 465
CHL CUP STROKE VOLUME: 32 mL
EERAT: 8.21
EWDT: 465 ms
FS: 28 % (ref 28–44)
IV/PV OW: 1.05
LA vol A4C: 64.1 ml
LADIAMINDEX: 2.13 cm/m2
LASIZE: 38 mm
LAVOL: 59.6 mL
LAVOLIN: 33.3 mL/m2
LEFT ATRIUM END SYS DIAM: 38 mm
LV TDI E'MEDIAL: 3.92
LV dias vol index: 34 mL/m2
LV dias vol: 61 mL (ref 46–106)
LV e' LATERAL: 6.2 cm/s
LV sys vol index: 16 mL/m2
LV sys vol: 29 mL (ref 14–42)
LVEEAVG: 8.21
LVEEMED: 8.21
LVOT area: 3.14 cm2
LVOT diameter: 20 mm
LVOT peak grad rest: 4 mmHg
LVOTPV: 99.8 cm/s
LVOTSV: 60 mL
Lateral S' vel: 13.9 cm/s
MV pk A vel: 125 m/s
MVPKEVEL: 50.9 m/s
PW: 12.1 mm — AB (ref 0.6–1.1)
RV TAPSE: 18.4 mm
RV sys press: 31 mmHg
Reg peak vel: 263 cm/s
Simpson's disk: 52
TDI e' lateral: 6.2
TRMAXVEL: 263 cm/s

## 2017-02-14 LAB — PHOSPHORUS: PHOSPHORUS: 3 mg/dL (ref 2.5–4.6)

## 2017-02-14 LAB — CBC WITH DIFFERENTIAL/PLATELET
BASOS ABS: 0 10*3/uL (ref 0.0–0.1)
Basophils Relative: 1 %
EOS ABS: 0.2 10*3/uL (ref 0.0–0.7)
EOS PCT: 4 %
HCT: 35 % — ABNORMAL LOW (ref 36.0–46.0)
HEMOGLOBIN: 11.4 g/dL — AB (ref 12.0–15.0)
LYMPHS ABS: 1 10*3/uL (ref 0.7–4.0)
LYMPHS PCT: 24 %
MCH: 30 pg (ref 26.0–34.0)
MCHC: 32.6 g/dL (ref 30.0–36.0)
MCV: 92.1 fL (ref 78.0–100.0)
Monocytes Absolute: 0.5 10*3/uL (ref 0.1–1.0)
Monocytes Relative: 13 %
NEUTROS PCT: 58 %
Neutro Abs: 2.5 10*3/uL (ref 1.7–7.7)
PLATELETS: 220 10*3/uL (ref 150–400)
RBC: 3.8 MIL/uL — AB (ref 3.87–5.11)
RDW: 15.8 % — ABNORMAL HIGH (ref 11.5–15.5)
WBC: 4.3 10*3/uL (ref 4.0–10.5)

## 2017-02-14 LAB — URIC ACID: Uric Acid, Serum: 5.6 mg/dL (ref 2.3–6.6)

## 2017-02-14 NOTE — Progress Notes (Signed)
Consent signed and chemotherapy teaching completed.  Extensive teaching packet given.  

## 2017-02-14 NOTE — Progress Notes (Signed)
*  PRELIMINARY RESULTS* Echocardiogram 2D Echocardiogram has been performed.  Samuel Germany 02/14/2017, 2:23 PM

## 2017-02-15 LAB — HEPATITIS B SURFACE ANTIGEN: HEP B S AG: NEGATIVE

## 2017-02-15 LAB — HEPATITIS B SURFACE ANTIBODY,QUALITATIVE: Hep B S Ab: NONREACTIVE

## 2017-02-16 ENCOUNTER — Encounter (HOSPITAL_COMMUNITY)
Admission: RE | Admit: 2017-02-16 | Discharge: 2017-02-16 | Disposition: A | Discharge status: Home / Self Care | Payer: Medicare Other | Source: Ambulatory Visit | Attending: General Surgery | Admitting: General Surgery

## 2017-02-16 ENCOUNTER — Encounter (HOSPITAL_COMMUNITY): Payer: Self-pay

## 2017-02-21 ENCOUNTER — Encounter (HOSPITAL_COMMUNITY): Payer: Self-pay | Admitting: Anesthesiology

## 2017-02-21 ENCOUNTER — Ambulatory Visit (HOSPITAL_COMMUNITY): Payer: Medicare Other

## 2017-02-21 ENCOUNTER — Ambulatory Visit (HOSPITAL_COMMUNITY): Payer: Medicare Other | Admitting: Anesthesiology

## 2017-02-21 ENCOUNTER — Ambulatory Visit (HOSPITAL_COMMUNITY)
Admission: RE | Admit: 2017-02-21 | Discharge: 2017-02-21 | Disposition: A | Discharge status: Home / Self Care | Payer: Medicare Other | Source: Ambulatory Visit | Attending: General Surgery | Admitting: General Surgery

## 2017-02-21 ENCOUNTER — Encounter (HOSPITAL_COMMUNITY)
Admission: RE | Disposition: A | Discharge status: Home / Self Care | Payer: Self-pay | Source: Ambulatory Visit | Attending: General Surgery

## 2017-02-21 ENCOUNTER — Other Ambulatory Visit: Payer: Self-pay | Admitting: Radiology

## 2017-02-21 DIAGNOSIS — Z800 Family history of malignant neoplasm of digestive organs: Secondary | ICD-10-CM | POA: Diagnosis not present

## 2017-02-21 DIAGNOSIS — I739 Peripheral vascular disease, unspecified: Secondary | ICD-10-CM | POA: Diagnosis not present

## 2017-02-21 DIAGNOSIS — Z86010 Personal history of colonic polyps: Secondary | ICD-10-CM | POA: Insufficient documentation

## 2017-02-21 DIAGNOSIS — Z87442 Personal history of urinary calculi: Secondary | ICD-10-CM | POA: Diagnosis not present

## 2017-02-21 DIAGNOSIS — M170 Bilateral primary osteoarthritis of knee: Secondary | ICD-10-CM | POA: Diagnosis not present

## 2017-02-21 DIAGNOSIS — Z87891 Personal history of nicotine dependence: Secondary | ICD-10-CM | POA: Diagnosis not present

## 2017-02-21 DIAGNOSIS — K219 Gastro-esophageal reflux disease without esophagitis: Secondary | ICD-10-CM | POA: Insufficient documentation

## 2017-02-21 DIAGNOSIS — I129 Hypertensive chronic kidney disease with stage 1 through stage 4 chronic kidney disease, or unspecified chronic kidney disease: Secondary | ICD-10-CM | POA: Insufficient documentation

## 2017-02-21 DIAGNOSIS — C8330 Diffuse large B-cell lymphoma, unspecified site: Secondary | ICD-10-CM | POA: Insufficient documentation

## 2017-02-21 DIAGNOSIS — Z809 Family history of malignant neoplasm, unspecified: Secondary | ICD-10-CM | POA: Diagnosis not present

## 2017-02-21 DIAGNOSIS — Z823 Family history of stroke: Secondary | ICD-10-CM | POA: Diagnosis not present

## 2017-02-21 DIAGNOSIS — Z8249 Family history of ischemic heart disease and other diseases of the circulatory system: Secondary | ICD-10-CM | POA: Insufficient documentation

## 2017-02-21 DIAGNOSIS — E1122 Type 2 diabetes mellitus with diabetic chronic kidney disease: Secondary | ICD-10-CM | POA: Diagnosis not present

## 2017-02-21 DIAGNOSIS — N189 Chronic kidney disease, unspecified: Secondary | ICD-10-CM | POA: Diagnosis not present

## 2017-02-21 DIAGNOSIS — M8580 Other specified disorders of bone density and structure, unspecified site: Secondary | ICD-10-CM | POA: Insufficient documentation

## 2017-02-21 DIAGNOSIS — Z95828 Presence of other vascular implants and grafts: Secondary | ICD-10-CM

## 2017-02-21 HISTORY — PX: PORTACATH PLACEMENT: SHX2246

## 2017-02-21 LAB — GLUCOSE, CAPILLARY
GLUCOSE-CAPILLARY: 98 mg/dL (ref 65–99)
Glucose-Capillary: 78 mg/dL (ref 65–99)

## 2017-02-21 SURGERY — INSERTION, TUNNELED CENTRAL VENOUS DEVICE, WITH PORT
Anesthesia: Monitor Anesthesia Care | Site: Chest | Laterality: Left

## 2017-02-21 MED ORDER — SODIUM CHLORIDE 0.9 % IV SOLN
INTRAVENOUS | Status: DC | PRN
  Administered 2017-02-21: 5 mL via INTRAMUSCULAR

## 2017-02-21 MED ORDER — KETOROLAC TROMETHAMINE 30 MG/ML IJ SOLN
30.0000 mg | Freq: Once | INTRAMUSCULAR | Status: AC
  Administered 2017-02-21: 30 mg via INTRAVENOUS
  Filled 2017-02-21: qty 1

## 2017-02-21 MED ORDER — CEFAZOLIN SODIUM-DEXTROSE 2-4 GM/100ML-% IV SOLN
2.0000 g | INTRAVENOUS | Status: AC
  Administered 2017-02-21: 2 g via INTRAVENOUS
  Filled 2017-02-21: qty 100

## 2017-02-21 MED ORDER — PROPOFOL 10 MG/ML IV BOLUS
INTRAVENOUS | Status: AC
  Filled 2017-02-21: qty 20

## 2017-02-21 MED ORDER — PROPOFOL 500 MG/50ML IV EMUL
INTRAVENOUS | Status: DC | PRN
  Administered 2017-02-21: 50 ug/kg/min via INTRAVENOUS

## 2017-02-21 MED ORDER — LACTATED RINGERS IV SOLN
INTRAVENOUS | Status: DC
  Administered 2017-02-21: 09:00:00 via INTRAVENOUS

## 2017-02-21 MED ORDER — CHLORHEXIDINE GLUCONATE CLOTH 2 % EX PADS: 6.0000 | MEDICATED_PAD | Freq: Once | CUTANEOUS | Status: DC

## 2017-02-21 MED ORDER — LIDOCAINE HCL (PF) 1 % IJ SOLN
INTRAMUSCULAR | Status: DC | PRN
  Administered 2017-02-21: 11 mL

## 2017-02-21 MED ORDER — MIDAZOLAM HCL 5 MG/5ML IJ SOLN
INTRAMUSCULAR | Status: DC | PRN
  Administered 2017-02-21: .5 mg via INTRAVENOUS

## 2017-02-21 MED ORDER — SODIUM CHLORIDE 0.9 % IJ SOLN
INTRAMUSCULAR | Status: AC
  Filled 2017-02-21: qty 10

## 2017-02-21 MED ORDER — HEPARIN SOD (PORK) LOCK FLUSH 100 UNIT/ML IV SOLN
INTRAVENOUS | Status: DC | PRN
Start: 2017-02-21 — End: 2017-02-21
  Administered 2017-02-21: 5 [IU] via INTRAVENOUS

## 2017-02-21 MED ORDER — MIDAZOLAM HCL 2 MG/2ML IJ SOLN
1.0000 mg | INTRAMUSCULAR | Status: AC
  Administered 2017-02-21: 2 mg via INTRAVENOUS
  Filled 2017-02-21: qty 2

## 2017-02-21 MED ORDER — FENTANYL CITRATE (PF) 100 MCG/2ML IJ SOLN: 25.0000 ug | INTRAMUSCULAR | Status: DC | PRN

## 2017-02-21 MED ORDER — MIDAZOLAM HCL 2 MG/2ML IJ SOLN
INTRAMUSCULAR | Status: AC
  Filled 2017-02-21: qty 2

## 2017-02-21 MED ORDER — EPHEDRINE SULFATE 50 MG/ML IJ SOLN
INTRAMUSCULAR | Status: AC
Start: 2017-02-21 — End: 2017-02-21
  Filled 2017-02-21: qty 1

## 2017-02-21 MED ORDER — LIDOCAINE HCL (PF) 1 % IJ SOLN
INTRAMUSCULAR | Status: AC
  Filled 2017-02-21: qty 30

## 2017-02-21 MED ORDER — PHENYLEPHRINE 40 MCG/ML (10ML) SYRINGE FOR IV PUSH (FOR BLOOD PRESSURE SUPPORT)
PREFILLED_SYRINGE | INTRAVENOUS | Status: AC
  Filled 2017-02-21: qty 10

## 2017-02-21 MED ORDER — SEVOFLURANE IN SOLN
RESPIRATORY_TRACT | Status: AC
  Filled 2017-02-21: qty 250

## 2017-02-21 MED ORDER — HEPARIN SOD (PORK) LOCK FLUSH 100 UNIT/ML IV SOLN
INTRAVENOUS | Status: AC
  Filled 2017-02-21: qty 5

## 2017-02-21 MED ORDER — FENTANYL CITRATE (PF) 100 MCG/2ML IJ SOLN
25.0000 ug | Freq: Once | INTRAMUSCULAR | Status: AC
  Administered 2017-02-21: 25 ug via INTRAVENOUS
  Filled 2017-02-21: qty 2

## 2017-02-21 SURGICAL SUPPLY — 38 items
ADH SKN CLS APL DERMABOND .7 (GAUZE/BANDAGES/DRESSINGS) ×1
APPLIER CLIP 9.375 SM OPEN (CLIP)
APR CLP SM 9.3 20 MLT OPN (CLIP)
BAG DECANTER FOR FLEXI CONT (MISCELLANEOUS) ×3 IMPLANT
BAG HAMPER (MISCELLANEOUS) ×3 IMPLANT
CATH HICKMAN DUAL 12.0 (CATHETERS) IMPLANT
CHLORAPREP W/TINT 10.5 ML (MISCELLANEOUS) ×3 IMPLANT
CLIP APPLIE 9.375 SM OPEN (CLIP) IMPLANT
CLOTH BEACON ORANGE TIMEOUT ST (SAFETY) ×3 IMPLANT
COVER LIGHT HANDLE STERIS (MISCELLANEOUS) ×6 IMPLANT
DECANTER SPIKE VIAL GLASS SM (MISCELLANEOUS) ×3 IMPLANT
DERMABOND ADVANCED (GAUZE/BANDAGES/DRESSINGS) ×2
DERMABOND ADVANCED .7 DNX12 (GAUZE/BANDAGES/DRESSINGS) ×1 IMPLANT
DRAPE C-ARM FOLDED MOBILE STRL (DRAPES) ×3 IMPLANT
ELECT REM PT RETURN 9FT ADLT (ELECTROSURGICAL) ×3
ELECTRODE REM PT RTRN 9FT ADLT (ELECTROSURGICAL) ×1 IMPLANT
GLOVE BIOGEL PI IND STRL 7.0 (GLOVE) ×1 IMPLANT
GLOVE BIOGEL PI INDICATOR 7.0 (GLOVE) ×2
GLOVE SURG SS PI 7.5 STRL IVOR (GLOVE) ×3 IMPLANT
GOWN STRL REUS W/TWL LRG LVL3 (GOWN DISPOSABLE) ×6 IMPLANT
IV NS 500ML (IV SOLUTION) ×3
IV NS 500ML BAXH (IV SOLUTION) ×1 IMPLANT
KIT PORT POWER 8FR ISP MRI (Port) ×3 IMPLANT
KIT ROOM TURNOVER APOR (KITS) ×3 IMPLANT
MANIFOLD NEPTUNE II (INSTRUMENTS) ×3 IMPLANT
NDL HYPO 25X1 1.5 SAFETY (NEEDLE) ×1 IMPLANT
NEEDLE HYPO 25X1 1.5 SAFETY (NEEDLE) ×3 IMPLANT
PACK MINOR (CUSTOM PROCEDURE TRAY) ×3 IMPLANT
PAD ARMBOARD 7.5X6 YLW CONV (MISCELLANEOUS) ×3 IMPLANT
SET BASIN LINEN APH (SET/KITS/TRAYS/PACK) ×3 IMPLANT
SET INTRODUCER 12FR PACEMAKER (SHEATH) IMPLANT
SHEATH COOK PEEL AWAY SET 8F (SHEATH) IMPLANT
SUT PROLENE 3 0 PS 2 (SUTURE) IMPLANT
SUT VIC AB 3-0 SH 27 (SUTURE) ×3
SUT VIC AB 3-0 SH 27X BRD (SUTURE) ×1 IMPLANT
SUT VIC AB 4-0 PS2 27 (SUTURE) ×3 IMPLANT
SYR 20CC LL (SYRINGE) ×3 IMPLANT
SYR CONTROL 10ML LL (SYRINGE) ×3 IMPLANT

## 2017-02-21 NOTE — Anesthesia Preprocedure Evaluation (Signed)
Anesthesia Evaluation  Patient identified by MRN, date of birth, ID band Patient awake    Reviewed: Allergy & Precautions, NPO status   Airway Mallampati: II  TM Distance: >3 FB     Dental  (+) Teeth Intact   Pulmonary neg pulmonary ROS, former smoker,    breath sounds clear to auscultation       Cardiovascular hypertension, Pt. on medications + Peripheral Vascular Disease   Rhythm:Regular Rate:Normal     Neuro/Psych    GI/Hepatic negative GI ROS, Neg liver ROS, GERD  Medicated and Controlled,  Endo/Other  diabetes, Type 2  Renal/GU Renal disease     Musculoskeletal   Abdominal   Peds  Hematology   Anesthesia Other Findings   Reproductive/Obstetrics                             Anesthesia Physical Anesthesia Plan  ASA: III  Anesthesia Plan: MAC   Post-op Pain Management:    Induction: Intravenous  Airway Management Planned: Simple Face Mask  Additional Equipment:   Intra-op Plan:   Post-operative Plan:   Informed Consent: I have reviewed the patients History and Physical, chart, labs and discussed the procedure including the risks, benefits and alternatives for the proposed anesthesia with the patient or authorized representative who has indicated his/her understanding and acceptance.     Plan Discussed with:   Anesthesia Plan Comments:         Anesthesia Quick Evaluation

## 2017-02-21 NOTE — Discharge Instructions (Signed)
Implanted Port Home Guide °An implanted port is a type of central line that is placed under the skin. Central lines are used to provide IV access when treatment or nutrition needs to be given through a person’s veins. Implanted ports are used for long-term IV access. An implanted port may be placed because: °· You need IV medicine that would be irritating to the small veins in your hands or arms. °· You need long-term IV medicines, such as antibiotics. °· You need IV nutrition for a long period. °· You need frequent blood draws for lab tests. °· You need dialysis. ° °Implanted ports are usually placed in the chest area, but they can also be placed in the upper arm, the abdomen, or the leg. An implanted port has two main parts: °· Reservoir. The reservoir is round and will appear as a small, raised area under your skin. The reservoir is the part where a needle is inserted to give medicines or draw blood. °· Catheter. The catheter is a thin, flexible tube that extends from the reservoir. The catheter is placed into a large vein. Medicine that is inserted into the reservoir goes into the catheter and then into the vein. ° °How will I care for my incision site? °Do not get the incision site wet. Bathe or shower as directed by your health care provider. °How is my port accessed? °Special steps must be taken to access the port: °· Before the port is accessed, a numbing cream can be placed on the skin. This helps numb the skin over the port site. °· Your health care provider uses a sterile technique to access the port. °? Your health care provider must put on a mask and sterile gloves. °? The skin over your port is cleaned carefully with an antiseptic and allowed to dry. °? The port is gently pinched between sterile gloves, and a needle is inserted into the port. °· Only "non-coring" port needles should be used to access the port. Once the port is accessed, a blood return should be checked. This helps ensure that the port  is in the vein and is not clogged. °· If your port needs to remain accessed for a constant infusion, a clear (transparent) bandage will be placed over the needle site. The bandage and needle will need to be changed every week, or as directed by your health care provider. °· Keep the bandage covering the needle clean and dry. Do not get it wet. Follow your health care provider’s instructions on how to take a shower or bath while the port is accessed. °· If your port does not need to stay accessed, no bandage is needed over the port. ° °What is flushing? °Flushing helps keep the port from getting clogged. Follow your health care provider’s instructions on how and when to flush the port. Ports are usually flushed with saline solution or a medicine called heparin. The need for flushing will depend on how the port is used. °· If the port is used for intermittent medicines or blood draws, the port will need to be flushed: °? After medicines have been given. °? After blood has been drawn. °? As part of routine maintenance. °· If a constant infusion is running, the port may not need to be flushed. ° °How long will my port stay implanted? °The port can stay in for as long as your health care provider thinks it is needed. When it is time for the port to come out, surgery will be   done to remove it. The procedure is similar to the one performed when the port was put in. °When should I seek immediate medical care? °When you have an implanted port, you should seek immediate medical care if: °· You notice a bad smell coming from the incision site. °· You have swelling, redness, or drainage at the incision site. °· You have more swelling or pain at the port site or the surrounding area. °· You have a fever that is not controlled with medicine. ° °This information is not intended to replace advice given to you by your health care provider. Make sure you discuss any questions you have with your health care provider. °Document  Released: 10/04/2005 Document Revised: 03/11/2016 Document Reviewed: 06/11/2013 °Elsevier Interactive Patient Education © 2017 Elsevier Inc[employer] °Implanted Port Insertion, Care After °This sheet gives you information about how to care for yourself after your procedure. Your health care provider may also give you more specific instructions. If you have problems or questions, contact your health care provider. °What can I expect after the procedure? °After your procedure, it is common to have: °· Discomfort at the port insertion site. °· Bruising on the skin over the port. This should improve over 3-4 days. ° °Follow these instructions at home: °Port care °· After your port is placed, you will get a manufacturer's information card. The card has information about your port. Keep this card with you at all times. °· Take care of the port as told by your health care provider. Ask your health care provider if you or a family member can get training for taking care of the port at home. A home health care nurse may also take care of the port. °· Make sure to remember what type of port you have. °Incision care °· Follow instructions from your health care provider about how to take care of your port insertion site. Make sure you: °? Wash your hands with soap and water before you change your bandage (dressing). If soap and water are not available, use hand sanitizer. °? Change your dressing as told by your health care provider. °? Leave stitches (sutures), skin glue, or adhesive strips in place. These skin closures may need to stay in place for 2 weeks or longer. If adhesive strip edges start to loosen and curl up, you may trim the loose edges. Do not remove adhesive strips completely unless your health care provider tells you to do that. °· Check your port insertion site every day for signs of infection. Check for: °? More redness, swelling, or pain. °? More fluid or blood. °? Warmth. °? Pus or a bad smell. °General  instructions °· Do not take baths, swim, or use a hot tub until your health care provider approves. °· Do not lift anything that is heavier than 10 lb (4.5 kg) for a week, or as told by your health care provider. °· Ask your health care provider when it is okay to: °? Return to work or school. °? Resume usual physical activities or sports. °· Do not drive for 24 hours if you were given a medicine to help you relax (sedative). °· Take over-the-counter and prescription medicines only as told by your health care provider. °· Wear a medical alert bracelet in case of an emergency. This will tell any health care providers that you have a port. °· Keep all follow-up visits as told by your health care provider. This is important. °Contact a health care provider if: °· You cannot   flush your port with saline as directed, or you cannot draw blood from the port. °· You have a fever or chills. °· You have more redness, swelling, or pain around your port insertion site. °· You have more fluid or blood coming from your port insertion site. °· Your port insertion site feels warm to the touch. °· You have pus or a bad smell coming from the port insertion site. °Get help right away if: °· You have chest pain or shortness of breath. °· You have bleeding from your port that you cannot control. °Summary °· Take care of the port as told by your health care provider. °· Change your dressing as told by your health care provider. °· Keep all follow-up visits as told by your health care provider. °This information is not intended to replace advice given to you by your health care provider. Make sure you discuss any questions you have with your health care provider. °Document Released: 07/25/2013 Document Revised: 08/25/2016 Document Reviewed: 08/25/2016 °Elsevier Interactive Patient Education © 2017 Elsevier Inc. ° °

## 2017-02-21 NOTE — Anesthesia Postprocedure Evaluation (Signed)
Anesthesia Post Note  Patient: [name]  Procedure(s) Performed: Procedure(s) (LRB): INSERTION PORT-A-CATH (Left)  Patient location during evaluation: PACU Anesthesia Type: MAC Level of consciousness: awake and alert and oriented Pain management: pain level controlled Vital Signs Assessment: post-procedure vital signs reviewed and stable Respiratory status: spontaneous breathing, respiratory function stable and patient connected to nasal cannula oxygen Cardiovascular status: stable Postop Assessment: no signs of nausea or vomiting Anesthetic complications: no     Last Vitals:  Vitals:   02/21/17 0910 02/21/17 0911  BP: 129/74 129/74  Pulse:    Resp: 14 (!) 47  Temp:      Last Pain:  Vitals:   02/21/17 0835  TempSrc: Oral                 [name] A

## 2017-02-21 NOTE — Anesthesia Procedure Notes (Signed)
Procedure Name: MAC Date/Time: 02/21/2017 10:31 AM Performed by: Andree Elk, Adriann Ballweg A Pre-anesthesia Checklist: Patient identified, Timeout performed, Emergency Drugs available and Suction available Oxygen Delivery Method: Simple face mask

## 2017-02-21 NOTE — Interval H&P Note (Signed)
History and Physical Interval Note:  02/21/2017 8:52 AM  [name]  has presented today for surgery, with the diagnosis of b-cell lymphoma  The various methods of treatment have been discussed with the patient and family. After consideration of risks, benefits and other options for treatment, the patient has consented to  Procedure(s): INSERTION PORT-A-CATH (N/A) as a surgical intervention .  The patient's history has been reviewed, patient examined, no change in status, stable for surgery.  I have reviewed the patient's chart and labs.  Questions were answered to the patient's satisfaction.     Aviva Signs

## 2017-02-21 NOTE — Transfer of Care (Signed)
Immediate Anesthesia Transfer of Care Note  Patient: [name]  Procedure(s) Performed: Procedure(s): INSERTION PORT-A-CATH (Left)  Patient Location: PACU  Anesthesia Type:MAC  Level of Consciousness: awake, alert , oriented and patient cooperative  Airway & Oxygen Therapy: Patient Spontanous Breathing and Patient connected to nasal cannula oxygen  Post-op Assessment: Report given to RN and Post -op Vital signs reviewed and stable  Post vital signs: Reviewed and stable  Last Vitals:  Vitals:   02/21/17 0910 02/21/17 0911  BP: 129/74 129/74  Pulse:    Resp: 14 (!) 47  Temp:      Last Pain:  Vitals:   02/21/17 0835  TempSrc: Oral      Patients Stated Pain Goal: 5 (99/37/16 9678)  Complications: No apparent anesthesia complications

## 2017-02-21 NOTE — Op Note (Signed)
Patient:  [name]  DOB:  Jun 25, 1934  MRN:  [id number]   Preop Diagnosis:  B-cell lymphoma, large cell  Postop Diagnosis:  Same  Procedure:  Port-A-Cath insertion  Surgeon:  Aviva Signs, M.D.  Anes:  Mac  Indications:  Patient is an 83 year old black female who was found recently to have a large B-cell lymphoma. The patient now presents for Port-A-Cath insertion. The risks and benefits of the procedure including bleeding, infection, and pneumothorax were fully explained to the patient, who gave informed consent.  Procedure note:  The patient was placed in the Trendelenburg position after the left upper chest was prepped and draped using the usual sterile technique with DuraPrep. Surgical site confirmation was performed. One percent Xylocaine was used for local anesthesia.  While on the Trendelenburg position, an incision was made below the left clavicle. A subcutaneous pocket was formed. The needles advanced into the left subclavian vein using the Seldinger technique without difficulty. In passing the guidewire, I had a difficult time getting the guidewire to go into the right atrium. A Transversing into the right subclavian vein. After multiple maneuvers, I was able to get the guidewire to go into the right atrium. An introducer and peel-away sheath were placed over the guidewire. The catheter was then inserted through the peel-away sheath and the peel-away sheath was removed. The cath was then attached to the port and the port placed in subcutaneous pocket. Adequate positioning was confirmed by fluoroscopy. Good backflow blood was noted on aspiration of the port. The port was flushed with heparin flush. The subcutaneous layer was reapproximated using a 3-0 Vicryl interrupted suture. The skin was closed using a 4-0 Vicryl subcuticular suture. Dermabond was applied.  All tape and needle counts were correct at the end of the procedure. The patient was transferred to PACU in stable  condition. A chest x-ray will be performed at that time.  Complications:  None  EBL:  Minimal  Specimen:  None

## 2017-02-22 ENCOUNTER — Other Ambulatory Visit: Payer: Self-pay | Admitting: Radiology

## 2017-02-22 ENCOUNTER — Encounter (HOSPITAL_COMMUNITY): Payer: Self-pay | Admitting: General Surgery

## 2017-02-23 ENCOUNTER — Ambulatory Visit (HOSPITAL_COMMUNITY): Admission: RE | Admit: 2017-02-23 | Payer: Medicare Other | Source: Ambulatory Visit

## 2017-02-23 ENCOUNTER — Ambulatory Visit (HOSPITAL_COMMUNITY): Payer: Medicare Other

## 2017-03-01 ENCOUNTER — Encounter (HOSPITAL_COMMUNITY): Payer: Self-pay

## 2017-03-01 ENCOUNTER — Encounter (HOSPITAL_COMMUNITY): Payer: Self-pay | Admitting: Oncology

## 2017-03-01 ENCOUNTER — Inpatient Hospital Stay (HOSPITAL_COMMUNITY): Payer: Medicare Other

## 2017-03-01 ENCOUNTER — Other Ambulatory Visit (HOSPITAL_COMMUNITY): Payer: Self-pay | Admitting: Oncology

## 2017-03-01 ENCOUNTER — Encounter (HOSPITAL_BASED_OUTPATIENT_CLINIC_OR_DEPARTMENT_OTHER): Payer: Medicare Other | Admitting: Oncology

## 2017-03-01 ENCOUNTER — Encounter (HOSPITAL_COMMUNITY): Payer: Medicare Other | Attending: Oncology

## 2017-03-01 VITALS — BP 130/64 | HR 83 | Temp 98.0°F | Resp 18 | Wt 135.0 lb

## 2017-03-01 DIAGNOSIS — D500 Iron deficiency anemia secondary to blood loss (chronic): Secondary | ICD-10-CM | POA: Insufficient documentation

## 2017-03-01 DIAGNOSIS — C8330 Diffuse large B-cell lymphoma, unspecified site: Secondary | ICD-10-CM | POA: Diagnosis not present

## 2017-03-01 DIAGNOSIS — Z5111 Encounter for antineoplastic chemotherapy: Secondary | ICD-10-CM | POA: Diagnosis not present

## 2017-03-01 LAB — URIC ACID: Uric Acid, Serum: 3.7 mg/dL (ref 2.3–6.6)

## 2017-03-01 LAB — COMPREHENSIVE METABOLIC PANEL
ALBUMIN: 3.4 g/dL — AB (ref 3.5–5.0)
ALT: 12 U/L — ABNORMAL LOW (ref 14–54)
AST: 47 U/L — AB (ref 15–41)
Alkaline Phosphatase: 57 U/L (ref 38–126)
Anion gap: 9 (ref 5–15)
BILIRUBIN TOTAL: 0.7 mg/dL (ref 0.3–1.2)
BUN: 26 mg/dL — AB (ref 6–20)
CO2: 30 mmol/L (ref 22–32)
Calcium: 9.9 mg/dL (ref 8.9–10.3)
Chloride: 100 mmol/L — ABNORMAL LOW (ref 101–111)
Creatinine, Ser: 1.33 mg/dL — ABNORMAL HIGH (ref 0.44–1.00)
GFR calc Af Amer: 43 mL/min — ABNORMAL LOW (ref >60)
GFR calc non Af Amer: 37 mL/min — ABNORMAL LOW (ref >60)
GLUCOSE: 141 mg/dL — AB (ref 65–99)
POTASSIUM: 3.6 mmol/L (ref 3.5–5.1)
Sodium: 139 mmol/L (ref 135–145)
TOTAL PROTEIN: 6.8 g/dL (ref 6.5–8.1)

## 2017-03-01 LAB — CBC WITH DIFFERENTIAL/PLATELET
BASOS ABS: 0 10*3/uL (ref 0.0–0.1)
BASOS PCT: 1 %
Eosinophils Absolute: 0.1 10*3/uL (ref 0.0–0.7)
Eosinophils Relative: 3 %
HEMATOCRIT: 33.6 % — AB (ref 36.0–46.0)
HEMOGLOBIN: 10.9 g/dL — AB (ref 12.0–15.0)
Lymphocytes Relative: 26 %
Lymphs Abs: 1.2 10*3/uL (ref 0.7–4.0)
MCH: 30.1 pg (ref 26.0–34.0)
MCHC: 32.4 g/dL (ref 30.0–36.0)
MCV: 92.8 fL (ref 78.0–100.0)
Monocytes Absolute: 0.5 10*3/uL (ref 0.1–1.0)
Monocytes Relative: 11 %
NEUTROS ABS: 2.8 10*3/uL (ref 1.7–7.7)
NEUTROS PCT: 59 %
Platelets: 257 10*3/uL (ref 150–400)
RBC: 3.62 MIL/uL — AB (ref 3.87–5.11)
RDW: 15.9 % — AB (ref 11.5–15.5)
WBC: 4.6 10*3/uL (ref 4.0–10.5)

## 2017-03-01 LAB — PHOSPHORUS: Phosphorus: 3.9 mg/dL (ref 2.5–4.6)

## 2017-03-01 LAB — LACTATE DEHYDROGENASE: LDH: 321 U/L — ABNORMAL HIGH (ref 98–192)

## 2017-03-01 MED ORDER — PEGFILGRASTIM 6 MG/0.6ML ~~LOC~~ PSKT
6.0000 mg | PREFILLED_SYRINGE | Freq: Once | SUBCUTANEOUS | Status: AC
  Administered 2017-03-01: 6 mg via SUBCUTANEOUS

## 2017-03-01 MED ORDER — DEXAMETHASONE SODIUM PHOSPHATE 10 MG/ML IJ SOLN
INTRAMUSCULAR | Status: AC
  Filled 2017-03-01: qty 1

## 2017-03-01 MED ORDER — PALONOSETRON HCL INJECTION 0.25 MG/5ML
0.2500 mg | Freq: Once | INTRAVENOUS | Status: AC
  Administered 2017-03-01: 0.25 mg via INTRAVENOUS

## 2017-03-01 MED ORDER — HEPARIN SOD (PORK) LOCK FLUSH 100 UNIT/ML IV SOLN
INTRAVENOUS | Status: AC
  Filled 2017-03-01: qty 5

## 2017-03-01 MED ORDER — ACETAMINOPHEN 325 MG PO TABS
650.0000 mg | ORAL_TABLET | Freq: Once | ORAL | Status: AC
  Administered 2017-03-01: 650 mg via ORAL

## 2017-03-01 MED ORDER — SODIUM CHLORIDE 0.9 % IV SOLN
750.0000 mg/m2 | Freq: Once | INTRAVENOUS | Status: AC
  Administered 2017-03-01: 1340 mg via INTRAVENOUS
  Filled 2017-03-01: qty 17

## 2017-03-01 MED ORDER — MORPHINE SULFATE (PF) 2 MG/ML IV SOLN
4.0000 mg | Freq: Once | INTRAVENOUS | Status: AC
  Administered 2017-03-01: 4 mg via INTRAVENOUS

## 2017-03-01 MED ORDER — PALONOSETRON HCL INJECTION 0.25 MG/5ML
INTRAVENOUS | Status: AC
  Filled 2017-03-01: qty 5

## 2017-03-01 MED ORDER — LIDOCAINE 1% INJECTION FOR CIRCUMCISION: 30.0000 mL | INJECTION | Freq: Once | INTRAVENOUS | Status: DC

## 2017-03-01 MED ORDER — MORPHINE SULFATE 10 MG/ML IJ SOLN
4.0000 mg | Freq: Once | INTRAMUSCULAR | Status: DC
  Filled 2017-03-01: qty 1

## 2017-03-01 MED ORDER — SODIUM CHLORIDE 0.9 % IV SOLN
Freq: Once | INTRAVENOUS | Status: AC
  Administered 2017-03-01: 09:00:00 via INTRAVENOUS

## 2017-03-01 MED ORDER — DOXORUBICIN HCL CHEMO IV INJECTION 2 MG/ML
50.0000 mg/m2 | Freq: Once | INTRAVENOUS | Status: AC
  Administered 2017-03-01: 90 mg via INTRAVENOUS
  Filled 2017-03-01: qty 45

## 2017-03-01 MED ORDER — DIPHENHYDRAMINE HCL 25 MG PO CAPS
ORAL_CAPSULE | ORAL | Status: AC
  Filled 2017-03-01: qty 2

## 2017-03-01 MED ORDER — DEXAMETHASONE SODIUM PHOSPHATE 10 MG/ML IJ SOLN: 10.0000 mg | Freq: Once | INTRAMUSCULAR | Status: DC

## 2017-03-01 MED ORDER — HEPARIN SOD (PORK) LOCK FLUSH 100 UNIT/ML IV SOLN
500.0000 [IU] | Freq: Once | INTRAVENOUS | Status: AC | PRN
  Administered 2017-03-01: 500 [IU]

## 2017-03-01 MED ORDER — SODIUM CHLORIDE 0.9 % IV SOLN
375.0000 mg/m2 | Freq: Once | INTRAVENOUS | Status: DC
  Filled 2017-03-01: qty 70

## 2017-03-01 MED ORDER — DIPHENHYDRAMINE HCL 25 MG PO CAPS
50.0000 mg | ORAL_CAPSULE | Freq: Once | ORAL | Status: AC
  Administered 2017-03-01: 50 mg via ORAL

## 2017-03-01 MED ORDER — DEXAMETHASONE SODIUM PHOSPHATE 10 MG/ML IJ SOLN
10.0000 mg | Freq: Once | INTRAMUSCULAR | Status: AC
  Administered 2017-03-01: 10 mg via INTRAVENOUS

## 2017-03-01 MED ORDER — SODIUM CHLORIDE 0.9% FLUSH
10.0000 mL | INTRAVENOUS | Status: DC | PRN
  Administered 2017-03-01: 10 mL
  Filled 2017-03-01: qty 10

## 2017-03-01 MED ORDER — SODIUM CHLORIDE 0.9 % IV SOLN
2.0000 mg | Freq: Once | INTRAVENOUS | Status: AC
  Administered 2017-03-01: 2 mg via INTRAVENOUS
  Filled 2017-03-01: qty 2

## 2017-03-01 MED ORDER — PEGFILGRASTIM 6 MG/0.6ML ~~LOC~~ PSKT
PREFILLED_SYRINGE | SUBCUTANEOUS | Status: AC
  Filled 2017-03-01: qty 0.6

## 2017-03-01 MED ORDER — SODIUM CHLORIDE 0.9 % IV SOLN: 10.0000 mg | Freq: Once | INTRAVENOUS | Status: DC

## 2017-03-01 MED ORDER — LIDOCAINE HCL (PF) 1 % IJ SOLN
30.0000 mL | Freq: Once | INTRAMUSCULAR | Status: AC
  Administered 2017-03-01: 20 mL

## 2017-03-01 MED ORDER — ACETAMINOPHEN 325 MG PO TABS
ORAL_TABLET | ORAL | Status: AC
  Filled 2017-03-01: qty 2

## 2017-03-01 MED ORDER — MORPHINE SULFATE (PF) 2 MG/ML IV SOLN
INTRAVENOUS | Status: AC
  Filled 2017-03-01: qty 2

## 2017-03-01 NOTE — Progress Notes (Signed)
Went to check on pt during first chemo visit.  Went over medication with pt again.  Pt is taking her allopurinol, acyclovir, and prednisone as prescribed.  Pt did not make it to her BMBX bc [name] had called the pt and told her 8:45 am appt time.  I had explained to the daughter when I did teaching that they need to arrive for the appt at [facility] at 6:46 am and the daughter knew that.  So once [name] called [name] and told her 8:45 am they thought that's what time they needed to be there.  Of course they were late.  They could not do the BMBX that day.  I spoke with Dr Talbert Cage and she needs the [facility] before we start chemo to finish staging.  We have arranged for the BMBX to be done in the clinic today.  She will have half her chemo today and the other half tomorrow.  I have adjusted her scheduled.

## 2017-03-01 NOTE — Patient Instructions (Addendum)
[place] at [facility] Discharge Instructions  RECOMMENDATIONS MADE BY THE CONSULTANT AND ANY TEST RESULTS WILL BE SENT TO YOUR REFERRING PHYSICIAN.  You were seen today by Dr. Twana First We performed your bone marrow biopsy today in clinic We will call you with the results of that biopsy    Thank you for choosing [place] at [facility] to provide your oncology and hematology care.  To afford each patient quality time with our provider, please arrive at least 15 minutes before your scheduled appointment time.    If you have a lab appointment with the [facility] please come in thru the  Main Entrance and check in at the main information desk  You need to re-schedule your appointment should you arrive 10 or more minutes late.  We strive to give you quality time with our providers, and arriving late affects you and other patients whose appointments are after yours.  Also, if you no show three or more times for appointments you may be dismissed from the clinic at the providers discretion.     Again, thank you for choosing [facility].  Our hope is that these requests will decrease the amount of time that you wait before being seen by our physicians.       _____________________________________________________________  Should you have questions after your visit to [facility], please contact our office at [phone] between the hours of 8:30 a.m. and 4:30 p.m.  Voicemails left after 4:30 p.m. will not be returned until the following business day.  For prescription refill requests, have your pharmacy contact our office.       Resources For Cancer Patients and their Caregivers ? American Cancer Society: Can assist with transportation, wigs, general needs, runs Look Good Feel Better.        417-249-0908 ? Cancer Care: Provides financial assistance, online support groups, medication/co-pay assistance.   1-800-813-HOPE 5611841015) ? [facility] Assists [place] cancer patients and their families through emotional , educational and financial support.  (734)378-8148 ? [place] DSS Where to apply for food stamps, Medicaid and utility assistance. 732-741-7743 ? [facility]: Transportation to medical appointments. (602)315-6037 ? Social Security Administration: May apply for disability if have a Stage IV cancer. (315) 074-8380 (779) 519-3719 ? [employer], Disability and Transit Services: Assists with nutrition, care and transit needs. [place] Support Programs: @10RELATIVEDAYS @ > Cancer Support Group  2nd Tuesday of the month 1pm-2pm, Journey Room  > Creative Journey  3rd Tuesday of the month 1130am-1pm, Journey Room  > Look Good Feel Better  1st Wednesday of the month 10am-12 noon, Journey Room (Call [place] to register [phone])

## 2017-03-01 NOTE — Patient Instructions (Signed)
[facility] Discharge Instructions for Patients Receiving Chemotherapy   Beginning January 23rd 2017 lab work for the Cancer Center will be done in the  Main lab at [facility] on 1st floor. If you have a lab appointment with the Cancer Center please come in thru the  Main Entrance and check in at the main information desk   Today you received the following chemotherapy agents   To help prevent nausea and vomiting after your treatment, we encourage you to take your nausea medication     If you develop nausea and vomiting, or diarrhea that is not controlled by your medication, call the clinic.  The clinic phone number is [phone]. Office hours are Monday-Friday 8:30am-5:00pm.  BELOW ARE SYMPTOMS THAT SHOULD BE REPORTED IMMEDIATELY:  *FEVER GREATER THAN 101.0 F  *CHILLS WITH OR WITHOUT FEVER  NAUSEA AND VOMITING THAT IS NOT CONTROLLED WITH YOUR NAUSEA MEDICATION  *UNUSUAL SHORTNESS OF BREATH  *UNUSUAL BRUISING OR BLEEDING  TENDERNESS IN MOUTH AND THROAT WITH OR WITHOUT PRESENCE OF ULCERS  *URINARY PROBLEMS  *BOWEL PROBLEMS  UNUSUAL RASH Items with * indicate a potential emergency and should be followed up as soon as possible. If you have an emergency after office hours please contact your primary care physician or go to the nearest emergency department.  Please call the clinic during office hours if you have any questions or concerns.   You may also contact the Patient Navigator at [phone] should you have any questions or need assistance in obtaining follow up care.      Resources For Cancer Patients and their Caregivers ? American Cancer Society: Can assist with transportation, wigs, general needs, runs Look Good Feel Better.        1-888-227-6333 ? Cancer Care: Provides financial assistance, online support groups, medication/co-pay assistance.  1-800-813-HOPE (4673) ? [facility] Assists [place] cancer  patients and their families through emotional , educational and financial support.  336-427-4357 ? [place] DSS Where to apply for food stamps, Medicaid and utility assistance. 336-342-1394 ? [employer]: Transportation to medical appointments. 336-347-2287 ? Social Security Administration: May apply for disability if have a Stage IV cancer. 336-342-7796 1-800-772-1213 ? [place] [employer], Disability and Transit Services: Assists with nutrition, care and transit needs. 336-349-2343         

## 2017-03-01 NOTE — Progress Notes (Signed)
Bone Marrow Biopsy and Aspiration Procedure Note   Informed consent was obtained and potential risks including bleeding, infection and pain were reviewed with the patient.   Posterior iliac crest(s) prepped with Betadine.   Lidocaine 1% local anesthesia infiltrated into the subcutaneous tissue.  Left bone marrow biopsy and left bone marrow aspirate was obtained.   The procedure was tolerated well and there were no complications.  Specimens sent for: routine histopathologic stains and sectioning, flow cytometry and cytogenetics  Physician: Twana First

## 2017-03-01 NOTE — Progress Notes (Signed)
1012 patient positioned prone with arms above head wrapped around a pillow.  One pillow beneath lower legs/feet. Patient reports being comfortable at this time. 1018. Time out performed verifying correct patient, correct position, correct procedure.  Patient and provider agree.  1019 procedure started with injection of lidocaine by provider.  1043 procedure complete. Patient cleaned of prep solution and bandaid placed as dressing.  Patient will remain flat in prone position for 30 minutes per provider.

## 2017-03-01 NOTE — Progress Notes (Signed)
Labs drawn, will go ahead with treatment per MD.  0923-Treatment on hold for now due too no biopsy done yet, per MD.   Patient agreeing to do bone marrow biopsy today by Dr. Talbert Cage.  Biopsy done today, chemotherapy ([facility] )done today, Rituxan will be given tomorrow per MD. .[name] arrived today for [facility] neulasta on body injector. See MAR for administration details. Injector in place and engaged with green light indicator on flashing. Tolerated application with out problems.went over with daughter in detail, they can take off onpro at 6pm tomorrow the  16th.    Vitals stable and discharged home from clinic via wheelchair. Follow up as scheduled.

## 2017-03-01 NOTE — Progress Notes (Signed)
Diana Diana, [place] [address]   CLINIC:  Medical Oncology/Hematology  PCP:  Fayrene Helper, MD [address], [address] [place] [address] [phone]   REASON FOR VISIT:  Follow-up for Diffuse Large B-cell Lymphoma (DLBCL):   CURRENT THERAPY: R-CHOP  BRIEF ONCOLOGIC HISTORY:    Diffuse large B cell lymphoma (Diana Diana)   12/20/2016 Imaging    CT abd/pelvis: IMPRESSION: 1. Extensive abdominal, pelvic and retrocrural lymphadenopathy, most pronounced in the retroperitoneum, as detailed above. Findings are highly suspicious for lymphoproliferative disorder and further evaluation is strongly recommended at this time. 2. Colonic diverticulosis without evidence of acute diverticulitis at this time. 3. Aortic atherosclerosis. 4. Additional incidental findings, as above.      12/30/2016 PET scan    IMPRESSION: 1. Extensive hypermetabolic abdominal and pelvic adenopathy, including bulky retroperitoneal adenopathy lifting of the abdominal aorta away from the spine. One of the larger lymph nodes measures 8.4 cm in short axis with maximum SUV 25.0. 2. Small pleural focus of tumor in the left posteromedial chest, maximum SUV only 4.2. This represents the only thoracic involvement. 3. Other imaging findings of potential clinical significance: Atherosclerotic aortic arch. Mild cardiomegaly. Mild atelectasis in both lower lobes. Hyperdense left renal cysts as on prior CT. Mild left hydronephrosis likely due to extrinsic mass effect on the left ureter. Right greater than left degenerative arthropathy of the hips.      01/31/2017 Surgery    Exploratory lap Arnoldo Morale)       01/31/2017 Pathology Results    Lymph node biospy: DLBCL, high-grade, monoclonal kappa restricted.       02/07/2017 Initial Diagnosis    Diffuse large B cell lymphoma (Diana Diana)     02/14/2017 Echocardiogram    Pre-chemo ECHO: EF 55%.         INTERVAL HISTORY:  Ms.  Diana Diana 83 y.o. female returns for follow-up and to review results of recent imaging and pathology.   The patient is accompanied by family today. The patient was scheduled for bone marrow biopsy for staging purposes, however the patient did not proceed with this due to a scheduling issue.  The patient denies chest pain, shortness of breath, or abdominal pain. The pateint denies night sweats. She reports she sleeps through the night. She reports weight loss; her family reports she has lost an estimated 20-30 lbs. The patient reports a reduced appetite, which she says accounts for her weight loss.    REVIEW OF SYSTEMS:  Review of Systems  Constitutional: Positive for appetite change (Reduced appetite), fatigue and unexpected weight change (Loss of 20-30lbs in the past several months). Negative for diaphoresis and fever.  HENT:  Negative.   Eyes: Negative.   Respiratory: Negative.  Negative for shortness of breath.   Cardiovascular: Positive for leg swelling (chronic ).  Gastrointestinal: Negative for abdominal pain. Constipation: resolved with laxatives.  Endocrine: Negative.  Negative for hot flashes.  Genitourinary: Negative.    Musculoskeletal: Negative.   Hematological: Negative.   Psychiatric/Behavioral: Negative.      PAST MEDICAL/SURGICAL HISTORY:  Past Medical History:  Diagnosis Date  . Arthritis of knee    bilateral    . Chronic renal insufficiency   . Diabetes mellitus, type 2 ([place])   . GERD (gastroesophageal reflux disease)   . History of kidney stones   . Hypertension   . Iron deficiency anemia 12/28/2016  . Lymphadenopathy, abdominal 12/28/2016  . Osteopenia   . PAD (peripheral artery disease) ([facility])   .  Personal history of colonic polyps 3 years ago    colonoscopy    Past Surgical History:  Procedure Laterality Date  . BIOPSY  12/16/2016   Procedure: BIOPSY;  Surgeon: Rogene Houston, MD;  [facility];  Service: Endoscopy;;  gastric polyp  . bunionectomy  bilateral  feet    . COLONOSCOPY N/A 12/16/2016   Procedure: COLONOSCOPY;  Surgeon: Rogene Houston, MD;  [facility];  Service: Endoscopy;  Laterality: N/A;  2:15  . DENTAL SURGERY    . ESOPHAGOGASTRODUODENOSCOPY N/A 12/16/2016   Procedure: ESOPHAGOGASTRODUODENOSCOPY (EGD);  Surgeon: Rogene Houston, MD;  [facility];  Service: Endoscopy;  Laterality: N/A;  . gum disease  2010   treated by Dr. Andree Elk   . LAPAROTOMY N/A 01/31/2017   Procedure: EXPLORATORY LAPAROTOMY;  Surgeon: Aviva Signs, MD;  Location: [facility];  Service: General;  Laterality: N/A;  . Left knee manipulation  under anasthesia due to contracture -  03/08/08   Dr. Aline Brochure   . left knee replacement  08/14/08   Dr. Aline Brochure   . LYMPH NODE BIOPSY  01/31/2017   Procedure: LYMPH NODE BIOPSY;  Surgeon: Aviva Signs, MD;  Location: [facility];  Service: General;;  . PORTACATH PLACEMENT Left 02/21/2017   Procedure: INSERTION PORT-A-CATH;  Surgeon: Aviva Signs, MD;  Location: [facility];  Service: General;  Laterality: Left;  . TOTAL ABDOMINAL HYSTERECTOMY     bleeding   . TOTAL KNEE ARTHROPLASTY Right 05/12/2016   Procedure: TOTAL KNEE ARTHROPLASTY;  Surgeon: [name], MD;  Location: [facility];  Service: Orthopedics;  Laterality: Right;  . WISDOM TOOTH EXTRACTION       SOCIAL HISTORY:  Social History   Social History  . Marital status: Widowed    Spouse name: N/A  . Number of children: 3  . Years of education: college    Occupational History  . retired Chemical engineer  Retired   Social History Main Topics  . Smoking status: Former Smoker    Packs/day: 0.25    Years: 30.00    Quit date: 10/19/1999  . Smokeless tobacco: Former Systems developer  . Alcohol use No  . Drug use: No  . Sexual activity: Not Currently   Other Topics Concern  . Not on file   Social History Narrative  . No narrative on file    FAMILY HISTORY:  Family History  Problem Relation Age of Onset  . Colon cancer Father   . Cancer Mother     . Stroke Mother   . Hypertension Brother     CURRENT MEDICATIONS:  Outpatient Encounter Prescriptions as of 02/08/2017  Medication Sig Note  . acetaminophen (TYLENOL) 325 MG tablet Take 650 mg by mouth every 4 (four) hours as needed for mild pain.    Marland Kitchen aspirin EC 81 MG tablet Take 81 mg by mouth daily. 01/24/2017: On hold due to upcoming procedure.  . Calcium Carb-Cholecalciferol (CALCIUM 600+D) 600-800 MG-UNIT TABS Take 1 tablet by mouth 2 (two) times daily.   . diphenhydrAMINE (BENADRYL) 50 MG tablet Take 50 mg by mouth at bedtime as needed for itching.   Marland Kitchen lisinopril-hydrochlorothiazide (PRINZIDE,ZESTORETIC) 20-12.5 MG tablet TAKE ONE TABLET BY MOUTH ONCE DAILY   . magnesium hydroxide (MILK OF MAGNESIA) 400 MG/5ML suspension Take 30 mLs by mouth every other day.   . multivitamin (THERAGRAN) per tablet Take 1 tablet by mouth daily.   01/24/2017: Time varies  . pantoprazole (PROTONIX) 40 MG tablet Take 1 tablet (40  mg total) by mouth daily before breakfast.   . Pediatric Multivitamins-Iron (FLINTSTONES PLUS IRON) chewable tablet Chew 1 tablet by mouth 2 (two) times daily.   . polyethylene glycol (MIRALAX / GLYCOLAX) packet Take 17 g by mouth daily as needed (constipation).  12/14/2016: Hasn't started yet.  . traMADol (ULTRAM) 50 MG tablet Take 1 tablet (50 mg total) by mouth every 6 (six) hours as needed for moderate pain.    No facility-administered encounter medications on file as of 02/08/2017.     ALLERGIES:  Allergies  Allergen Reactions  . Codeine Hives  . Fosamax [Alendronate Sodium] Other (See Comments)    Pt reports excessive urination on the day she took the pill     PHYSICAL EXAM:  ECOG Performance status: 2 - Symptomatic; requires occasional assistance    Physical Exam  Constitutional: She is oriented to person, place, and time.  HENT:  Head: Normocephalic and atraumatic.  Mouth/Throat: Oropharynx is clear and moist.  Eyes: Conjunctivae are normal. Pupils are equal,  round, and reactive to light.  Neck: Normal range of motion. Neck supple.  Cardiovascular: Normal rate and regular rhythm.  Exam reveals no gallop and no friction rub.   No murmur heard. Pulmonary/Chest: Effort normal. No respiratory distress. She has no wheezes. She has no rales. She exhibits no tenderness.  Abdominal: Soft. Bowel sounds are normal. She exhibits no distension. There is no tenderness. There is no rebound.  Musculoskeletal: Normal range of motion.  1+ bilateral ankle edema   Lymphadenopathy:    She has no axillary adenopathy.       Right: No supraclavicular adenopathy present.       Left: No supraclavicular adenopathy present.  Neurological: She is alert and oriented to person, place, and time.  Skin: Skin is warm and dry.  Psychiatric: Mood, memory, affect and judgment normal.  Nursing note and vitals reviewed.    LABORATORY DATA:  I have reviewed the labs as listed.  CBC    Component Value Date/Time   WBC 4.3 02/14/2017 1235   RBC 3.80 (L) 02/14/2017 1235   HGB 11.4 (L) 02/14/2017 1235   HCT 35.0 (L) 02/14/2017 1235   PLT 220 02/14/2017 1235   MCV 92.1 02/14/2017 1235   MCH 30.0 02/14/2017 1235   MCHC 32.6 02/14/2017 1235   RDW 15.8 (H) 02/14/2017 1235   LYMPHSABS 1.0 02/14/2017 1235   MONOABS 0.5 02/14/2017 1235   EOSABS 0.2 02/14/2017 1235   BASOSABS 0.0 02/14/2017 1235   CMP Latest Ref Rng & Units 02/01/2017 01/26/2017 12/29/2016  Glucose 65 - 99 mg/dL 102(H) 119(H) 103(H)  BUN 6 - 20 mg/dL 22(H) 31(H) 27(H)  Creatinine 0.44 - 1.00 mg/dL 1.34(H) 1.22(H) 1.27(H)  Sodium 135 - 145 mmol/L 138 141 139  Potassium 3.5 - 5.1 mmol/L 4.0 3.6 4.0  Chloride 101 - 111 mmol/L 103 103 104  CO2 22 - 32 mmol/L 29 29 29   Calcium 8.9 - 10.3 mg/dL 8.7(L) 9.4 9.2  Total Protein 6.5 - 8.1 g/dL - - 6.9  Total Bilirubin 0.3 - 1.2 mg/dL - - 0.3  Alkaline Phos 38 - 126 U/L - - 57  AST 15 - 41 U/L - - 42(H)  ALT 14 - 54 U/L - - 13(L)    PENDING LABS:    DIAGNOSTIC  IMAGING:  PET scan: 12/30/16       PATHOLOGY:  Lymph node biopsy: 01/31/17    Cytology: 01/31/17    ASSESSMENT & PLAN:  At least Stage II  Diffuse Large B-cell Lymphoma (DLBCL) At risk for tumor lysis syndrome  PLAN: The patient was scheduled for bone marrow biopsy for staging purposes, however the patient did not proceed with this. The patient understands she must undergo biopsy before beginning chemotherapy in order to properly stage her disease.  We discussed the option for bone marrow biopsy in our clinic today. I offered to refer the patient to IR in [facility] for biopsy, though this may not be scheduled for several weeks and would delay her chemotherapy treatment. The patient prefers to proceed with biopsy in our clinic today. Logistics of biopsy and potential risks were discussed with the patient.  Proceed with bone marrow biopsy. Once bone marrow biopsy is complete, she will receive the CHOP part of cycle 1 today and the rituxan part tomorrow.   Check her labs on 03/04/17 with CBC, CMP, uric acid to assess if patient is having TLS. May need rasburicase if she does have evidence of TLS.   RTC next week to assess tolerability to chemo. If patient has a lot of side effects may consider switching her to R-miniCHOP.   I strongly encouraged the patient to increase her food intake to maintain weight and nutrition. She may drink 3 Ensures daily if she is unable to tolerate solid foods. She should also maintain adequate hydration. The patient understands and is in agreement.   All questions were answered to patient's stated satisfaction. Encouraged patient to call with any new concerns or questions before her next visit to the cancer center and we can certain see her sooner, if needed.    I spent 60 minutes face to face with the patient and more than 50% of that time was spent in counseling and/or coordination of care.  This document serves as a record of services personally  performed by Twana First, MD. It was created on her behalf by Maryla Morrow, a trained medical scribe. The creation of this record is based on the scribe's personal observations and the provider's statements to them. This document has been checked and approved by the attending provider.  This note was electronically signed by:  Twana First, MD 03/01/17

## 2017-03-02 ENCOUNTER — Encounter (HOSPITAL_BASED_OUTPATIENT_CLINIC_OR_DEPARTMENT_OTHER): Payer: Medicare Other

## 2017-03-02 VITALS — BP 128/56 | HR 58 | Temp 97.8°F | Resp 18

## 2017-03-02 DIAGNOSIS — C8330 Diffuse large B-cell lymphoma, unspecified site: Secondary | ICD-10-CM

## 2017-03-02 DIAGNOSIS — Z5112 Encounter for antineoplastic immunotherapy: Secondary | ICD-10-CM | POA: Diagnosis not present

## 2017-03-02 DIAGNOSIS — D500 Iron deficiency anemia secondary to blood loss (chronic): Secondary | ICD-10-CM | POA: Diagnosis not present

## 2017-03-02 MED ORDER — HEPARIN SOD (PORK) LOCK FLUSH 100 UNIT/ML IV SOLN
500.0000 [IU] | Freq: Once | INTRAVENOUS | Status: AC
Start: 2017-03-02 — End: 2017-03-02
  Administered 2017-03-02: 500 [IU] via INTRAVENOUS

## 2017-03-02 MED ORDER — SODIUM CHLORIDE 0.9 % IV SOLN
375.0000 mg/m2 | Freq: Once | INTRAVENOUS | Status: AC
  Administered 2017-03-02: 700 mg via INTRAVENOUS
  Filled 2017-03-02: qty 50

## 2017-03-02 MED ORDER — DIPHENHYDRAMINE HCL 25 MG PO CAPS
50.0000 mg | ORAL_CAPSULE | Freq: Once | ORAL | Status: AC
  Administered 2017-03-02: 50 mg via ORAL

## 2017-03-02 MED ORDER — SODIUM CHLORIDE 0.9 % IV SOLN
INTRAVENOUS | Status: DC
  Administered 2017-03-02: 11:00:00 via INTRAVENOUS

## 2017-03-02 MED ORDER — ACETAMINOPHEN 325 MG PO TABS
ORAL_TABLET | ORAL | Status: AC
Start: 2017-03-02 — End: 2017-03-02
  Filled 2017-03-02: qty 2

## 2017-03-02 MED ORDER — ACETAMINOPHEN 325 MG PO TABS
650.0000 mg | ORAL_TABLET | Freq: Once | ORAL | Status: AC
  Administered 2017-03-02: 650 mg via ORAL

## 2017-03-02 MED ORDER — DIPHENHYDRAMINE HCL 25 MG PO CAPS
ORAL_CAPSULE | ORAL | Status: AC
  Filled 2017-03-02: qty 2

## 2017-03-02 MED ORDER — PEGFILGRASTIM INJECTION 6 MG/0.6ML ~~LOC~~
6.0000 mg | PREFILLED_SYRINGE | Freq: Once | SUBCUTANEOUS | Status: AC
  Administered 2017-03-02: 6 mg via SUBCUTANEOUS
  Filled 2017-03-02: qty 0.6

## 2017-03-02 MED ORDER — HEPARIN SOD (PORK) LOCK FLUSH 100 UNIT/ML IV SOLN
INTRAVENOUS | Status: AC
  Filled 2017-03-02: qty 5

## 2017-03-02 NOTE — Progress Notes (Signed)
Pt states that her Neulasta OnPro fell off of her arm this morning while she was showering.  Pt brought the device in with her - the medication level indicator reads "full", and the green light is blinking.  Notified T. Kefalas, PA-C.  Per PA, we will give Neulasta injection today, and retry the OnPro with her next cycle.   Tolerated infusion w/o adverse reaction. Alert, in no distress.  VSS.  Discharged via wheelchair in c/o daughter.

## 2017-03-02 NOTE — Patient Instructions (Signed)
[facility] Discharge Instructions for Patients Receiving Chemotherapy   Beginning January 23rd 2017 lab work for the [facility] will be done in the  Main lab at [facility] on 1st floor. If you have a lab appointment with the [facility] please come in thru the  Main Entrance and check in at the main information desk   Today you received the following infusion:  Rituxan  If you develop nausea and vomiting, or diarrhea that is not controlled by your medication, call the clinic.  The clinic phone number is [phone]. Office hours are Monday-Friday 8:30am-5:00pm.  BELOW ARE SYMPTOMS THAT SHOULD BE REPORTED IMMEDIATELY:  *FEVER GREATER THAN 101.0 F  *CHILLS WITH OR WITHOUT FEVER  NAUSEA AND VOMITING THAT IS NOT CONTROLLED WITH YOUR NAUSEA MEDICATION  *UNUSUAL SHORTNESS OF BREATH  *UNUSUAL BRUISING OR BLEEDING  TENDERNESS IN MOUTH AND THROAT WITH OR WITHOUT PRESENCE OF ULCERS  *URINARY PROBLEMS  *BOWEL PROBLEMS  UNUSUAL RASH Items with * indicate a potential emergency and should be followed up as soon as possible. If you have an emergency after office hours please contact your primary care physician or go to the nearest emergency department.  Please call the clinic during office hours if you have any questions or concerns.   You may also contact the Patient Navigator at [phone] should you have any questions or need assistance in obtaining follow up care.      Resources For Cancer Patients and their Caregivers ? American Cancer Society: Can assist with transportation, wigs, general needs, runs Look Good Feel Better.        445-217-0225 ? Cancer Care: Provides financial assistance, online support groups, medication/co-pay assistance.  1-800-813-HOPE 651-730-1910) ? [place] Assists [place] cancer patients and their families through emotional , educational and financial support.  786-439-6269 ? [place]  DSS Where to apply for food stamps, Medicaid and utility assistance. (703)133-6663 ? RCATS: Transportation to medical appointments. 567-530-1468 ? Social Security Administration: May apply for disability if have a Stage IV cancer. 734 551 2002 657-308-0350 ? [employer], Disability and Transit Services: Assists with nutrition, care and transit needs. [phone]

## 2017-03-03 ENCOUNTER — Telehealth (HOSPITAL_COMMUNITY): Payer: Self-pay

## 2017-03-03 NOTE — Telephone Encounter (Signed)
24 hour follow up - daughter states mother is doing pretty good, some nausea , took medicine, feeling better. Eating and drinking some today. Will call if they have any questions or concerns.

## 2017-03-04 ENCOUNTER — Encounter (HOSPITAL_COMMUNITY): Payer: Medicare Other

## 2017-03-04 ENCOUNTER — Ambulatory Visit (HOSPITAL_COMMUNITY): Payer: Medicare Other

## 2017-03-04 DIAGNOSIS — C8330 Diffuse large B-cell lymphoma, unspecified site: Secondary | ICD-10-CM

## 2017-03-04 DIAGNOSIS — D500 Iron deficiency anemia secondary to blood loss (chronic): Secondary | ICD-10-CM | POA: Diagnosis not present

## 2017-03-04 LAB — COMPREHENSIVE METABOLIC PANEL
ALBUMIN: 3.4 g/dL — AB (ref 3.5–5.0)
ALT: 15 U/L (ref 14–54)
AST: 44 U/L — AB (ref 15–41)
Alkaline Phosphatase: 68 U/L (ref 38–126)
Anion gap: 8 (ref 5–15)
BUN: 30 mg/dL — AB (ref 6–20)
CHLORIDE: 100 mmol/L — AB (ref 101–111)
CO2: 30 mmol/L (ref 22–32)
Calcium: 9.6 mg/dL (ref 8.9–10.3)
Creatinine, Ser: 1.06 mg/dL — ABNORMAL HIGH (ref 0.44–1.00)
GFR calc Af Amer: 56 mL/min — ABNORMAL LOW (ref >60)
GFR, EST NON AFRICAN AMERICAN: 48 mL/min — AB (ref >60)
Glucose, Bld: 101 mg/dL — ABNORMAL HIGH (ref 65–99)
POTASSIUM: 3.9 mmol/L (ref 3.5–5.1)
SODIUM: 138 mmol/L (ref 135–145)
Total Bilirubin: 0.7 mg/dL (ref 0.3–1.2)
Total Protein: 6.4 g/dL — ABNORMAL LOW (ref 6.5–8.1)

## 2017-03-04 LAB — CBC WITH DIFFERENTIAL/PLATELET
BASOS ABS: 0 10*3/uL (ref 0.0–0.1)
Basophils Relative: 0 %
EOS ABS: 0.2 10*3/uL (ref 0.0–0.7)
Eosinophils Relative: 1 %
HCT: 33.6 % — ABNORMAL LOW (ref 36.0–46.0)
Hemoglobin: 10.9 g/dL — ABNORMAL LOW (ref 12.0–15.0)
Lymphocytes Relative: 5 %
Lymphs Abs: 1 10*3/uL (ref 0.7–4.0)
MCH: 30.4 pg (ref 26.0–34.0)
MCHC: 32.4 g/dL (ref 30.0–36.0)
MCV: 93.6 fL (ref 78.0–100.0)
MONO ABS: 0.2 10*3/uL (ref 0.1–1.0)
Monocytes Relative: 1 %
NEUTROS PCT: 93 %
Neutro Abs: 17.8 10*3/uL — ABNORMAL HIGH (ref 1.7–7.7)
Platelets: 171 10*3/uL (ref 150–400)
RBC: 3.59 MIL/uL — AB (ref 3.87–5.11)
RDW: 16.2 % — AB (ref 11.5–15.5)
WBC: 19.2 10*3/uL — AB (ref 4.0–10.5)

## 2017-03-04 LAB — LACTATE DEHYDROGENASE: LDH: 341 U/L — ABNORMAL HIGH (ref 98–192)

## 2017-03-04 LAB — URIC ACID: Uric Acid, Serum: 3.7 mg/dL (ref 2.3–6.6)

## 2017-03-04 LAB — PHOSPHORUS: Phosphorus: 3.3 mg/dL (ref 2.5–4.6)

## 2017-03-04 MED ORDER — HEPARIN SOD (PORK) LOCK FLUSH 100 UNIT/ML IV SOLN
INTRAVENOUS | Status: AC
  Filled 2017-03-04: qty 5

## 2017-03-04 NOTE — Progress Notes (Signed)
Reviewed labs with Dr.Zhou. Per Dr.Zhou informed patient and daughter that uric acid level is the same as before, therefore patient does not need rasburicase infusion. Also creatinine level is better and she should continue to hydrate self and keep kidneys flushed. Informed that bone marrow biopsy was negative. Patient and daughter verbalized understanding. Return as scheduled.

## 2017-03-07 ENCOUNTER — Ambulatory Visit (HOSPITAL_COMMUNITY): Payer: Medicare Other

## 2017-03-08 ENCOUNTER — Other Ambulatory Visit (HOSPITAL_COMMUNITY): Payer: Medicare Other

## 2017-03-08 ENCOUNTER — Ambulatory Visit (HOSPITAL_COMMUNITY): Payer: Medicare Other

## 2017-03-11 ENCOUNTER — Encounter (HOSPITAL_BASED_OUTPATIENT_CLINIC_OR_DEPARTMENT_OTHER): Payer: Medicare Other | Admitting: Oncology

## 2017-03-11 ENCOUNTER — Encounter (HOSPITAL_COMMUNITY): Payer: Self-pay

## 2017-03-11 ENCOUNTER — Encounter (HOSPITAL_COMMUNITY): Payer: Medicare Other

## 2017-03-11 VITALS — BP 108/53 | HR 93 | Temp 98.0°F | Resp 18 | Wt 136.0 lb

## 2017-03-11 DIAGNOSIS — C8333 Diffuse large B-cell lymphoma, intra-abdominal lymph nodes: Secondary | ICD-10-CM | POA: Diagnosis not present

## 2017-03-11 DIAGNOSIS — C8330 Diffuse large B-cell lymphoma, unspecified site: Secondary | ICD-10-CM

## 2017-03-11 DIAGNOSIS — D500 Iron deficiency anemia secondary to blood loss (chronic): Secondary | ICD-10-CM | POA: Diagnosis not present

## 2017-03-11 LAB — CBC WITH DIFFERENTIAL/PLATELET
BASOS ABS: 0.1 10*3/uL (ref 0.0–0.1)
Basophils Relative: 1 %
EOS ABS: 0 10*3/uL (ref 0.0–0.7)
Eosinophils Relative: 0 %
HCT: 31.1 % — ABNORMAL LOW (ref 36.0–46.0)
HEMOGLOBIN: 10.2 g/dL — AB (ref 12.0–15.0)
LYMPHS PCT: 13 %
Lymphs Abs: 0.8 10*3/uL (ref 0.7–4.0)
MCH: 30.1 pg (ref 26.0–34.0)
MCHC: 32.8 g/dL (ref 30.0–36.0)
MCV: 91.7 fL (ref 78.0–100.0)
Monocytes Absolute: 0.8 10*3/uL (ref 0.1–1.0)
Monocytes Relative: 12 %
NEUTROS ABS: 4.7 10*3/uL (ref 1.7–7.7)
NEUTROS PCT: 74 %
Platelets: 117 10*3/uL — ABNORMAL LOW (ref 150–400)
RBC: 3.39 MIL/uL — AB (ref 3.87–5.11)
RDW: 15.6 % — ABNORMAL HIGH (ref 11.5–15.5)
WBC: 6.4 10*3/uL (ref 4.0–10.5)

## 2017-03-11 LAB — COMPREHENSIVE METABOLIC PANEL
ALBUMIN: 3.2 g/dL — AB (ref 3.5–5.0)
ALK PHOS: 58 U/L (ref 38–126)
ALT: 13 U/L — ABNORMAL LOW (ref 14–54)
ANION GAP: 8 (ref 5–15)
AST: 26 U/L (ref 15–41)
BUN: 25 mg/dL — ABNORMAL HIGH (ref 6–20)
CALCIUM: 9.3 mg/dL (ref 8.9–10.3)
CO2: 30 mmol/L (ref 22–32)
Chloride: 101 mmol/L (ref 101–111)
Creatinine, Ser: 0.91 mg/dL (ref 0.44–1.00)
GFR calc Af Amer: >60 mL/min (ref >60)
GFR calc non Af Amer: 58 mL/min — ABNORMAL LOW (ref >60)
GLUCOSE: 118 mg/dL — AB (ref 65–99)
POTASSIUM: 3.4 mmol/L — AB (ref 3.5–5.1)
SODIUM: 139 mmol/L (ref 135–145)
Total Bilirubin: 0.6 mg/dL (ref 0.3–1.2)
Total Protein: 6.5 g/dL (ref 6.5–8.1)

## 2017-03-11 LAB — URIC ACID: Uric Acid, Serum: 2.8 mg/dL (ref 2.3–6.6)

## 2017-03-11 NOTE — Patient Instructions (Signed)
[place] at [facility] Discharge Instructions  RECOMMENDATIONS MADE BY THE CONSULTANT AND ANY TEST RESULTS WILL BE SENT TO YOUR REFERRING PHYSICIAN.  You saw Dr. Talbert Cage today. Please see Amy at checkout for appointments.  Thank you for choosing [place] at [facility] to provide your oncology and hematology care.  To afford each patient quality time with our provider, please arrive at least 15 minutes before your scheduled appointment time.    If you have a lab appointment with the [facility] please come in thru the  Main Entrance and check in at the main information desk  You need to re-schedule your appointment should you arrive 10 or more minutes late.  We strive to give you quality time with our providers, and arriving late affects you and other patients whose appointments are after yours.  Also, if you no show three or more times for appointments you may be dismissed from the clinic at the providers discretion.     Again, thank you for choosing [facility].  Our hope is that these requests will decrease the amount of time that you wait before being seen by our physicians.       _____________________________________________________________  Should you have questions after your visit to [facility], please contact our office at [phone] between the hours of 8:30 a.m. and 4:30 p.m.  Voicemails left after 4:30 p.m. will not be returned until the following business day.  For prescription refill requests, have your pharmacy contact our office.       Resources For Cancer Patients and their Caregivers ? American Cancer Society: Can assist with transportation, wigs, general needs, runs Look Good Feel Better.        (904)433-0342 ? Cancer Care: Provides financial assistance, online support groups, medication/co-pay assistance.  1-800-813-HOPE 249-839-3474) ? [place] Assists [place]  cancer patients and their families through emotional , educational and financial support.  340-097-6208 ? [place] DSS Where to apply for food stamps, Medicaid and utility assistance. 367 042 8237 ? RCATS: Transportation to medical appointments. (732)625-2494 ? Social Security Administration: May apply for disability if have a Stage IV cancer. 281 717 4349 (225) 773-0513 ? [employer], Disability and Transit Services: Assists with nutrition, care and transit needs. [place] Support Programs: @10RELATIVEDAYS @ > Cancer Support Group  2nd Tuesday of the month 1pm-2pm, Journey Room  > Creative Journey  3rd Tuesday of the month 1130am-1pm, Journey Room  > Look Good Feel Better  1st Wednesday of the month 10am-12 noon, Journey Room (Call [facility] to register [phone])

## 2017-03-11 NOTE — Progress Notes (Signed)
[name], [place] [address]   CLINIC:  Medical Oncology/Hematology  PCP:  Fayrene Helper, MD [address] [place] [address] [phone]   REASON FOR VISIT:  Follow-up for Diffuse Large B-cell Lymphoma (DLBCL):   CURRENT THERAPY: R-CHOP  BRIEF ONCOLOGIC HISTORY:    Diffuse large B cell lymphoma ([place])   12/20/2016 Imaging    CT abd/pelvis: IMPRESSION: 1. Extensive abdominal, pelvic and retrocrural lymphadenopathy, most pronounced in the retroperitoneum, as detailed above. Findings are highly suspicious for lymphoproliferative disorder and further evaluation is strongly recommended at this time. 2. Colonic diverticulosis without evidence of acute diverticulitis at this time. 3. Aortic atherosclerosis. 4. Additional incidental findings, as above.      12/30/2016 PET scan    IMPRESSION: 1. Extensive hypermetabolic abdominal and pelvic adenopathy, including bulky retroperitoneal adenopathy lifting of the abdominal aorta away from the spine. One of the larger lymph nodes measures 8.4 cm in short axis with maximum SUV 25.0. 2. Small pleural focus of tumor in the left posteromedial chest, maximum SUV only 4.2. This represents the only thoracic involvement. 3. Other imaging findings of potential clinical significance: Atherosclerotic aortic arch. Mild cardiomegaly. Mild atelectasis in both lower lobes. Hyperdense left renal cysts as on prior CT. Mild left hydronephrosis likely due to extrinsic mass effect on the left ureter. Right greater than left degenerative arthropathy of the hips.      01/31/2017 Surgery    Exploratory lap Arnoldo Morale)       01/31/2017 Pathology Results    Lymph node biospy: DLBCL, high-grade, monoclonal kappa restricted.       02/07/2017 Initial Diagnosis    Diffuse large B cell lymphoma ([facility])     02/14/2017 Echocardiogram    Pre-chemo ECHO: EF 55%.       03/01/2017 Bone Marrow Biopsy   No bone marrow involvement by lymphoma        INTERVAL HISTORY:  [name] 83 y.o. female returns for follow-up and to review results of recent imaging and pathology.   The patient is accompanied by family today. Patient received cycle 1 of R CHOP on 03/01/17. Due to a delay in scheduling of her bone marrow biopsy that need to be done on 03/01/17 as well. Pathology from bone marrow biopsy does not demonstrate any involvement with lymphoma. Patient tolerated her chemotherapy very well. She states her appetite was great last week likely due to the Decadron that she has been taking for her lymphoma. She denies any severe fatigue since getting her treatment. She did not have any evidence of tumor lysis syndrome. Her daughter who accompanies today states that patient does not walk around much at home since she had her knee surgery. The patient spent also times sitting down. She does not spend all of her time in bed. The patient denies chest pain, shortness of breath, or abdominal pain. The pateint denies night sweats, fevers/chills. She reports she sleeps through the night.  REVIEW OF SYSTEMS:  Review of Systems  Constitutional: Positive for fatigue. Negative for appetite change (Reduced appetite), diaphoresis, fever and unexpected weight change.  HENT:  Negative.   Eyes: Negative.   Respiratory: Negative.  Negative for shortness of breath.   Cardiovascular: Positive for leg swelling (chronic ).  Gastrointestinal: Negative for abdominal pain. Constipation: resolved with laxatives.  Endocrine: Negative.  Negative for hot flashes.  Genitourinary: Negative.    Musculoskeletal: Negative.   Hematological: Negative.   Psychiatric/Behavioral: Negative.  PAST MEDICAL/SURGICAL HISTORY:  Past Medical History:  Diagnosis Date  . Arthritis of knee    bilateral    . Chronic renal insufficiency   . Diabetes mellitus, type 2 ([facility])   . GERD (gastroesophageal reflux disease)   . History of kidney  stones   . Hypertension   . Iron deficiency anemia 12/28/2016  . Lymphadenopathy, abdominal 12/28/2016  . Osteopenia   . PAD (peripheral artery disease) ([facility])   . Personal history of colonic polyps 3 years ago    colonoscopy    Past Surgical History:  Procedure Laterality Date  . BIOPSY  12/16/2016   Procedure: BIOPSY;  Surgeon: Rogene Houston, MD;  [facility];  Service: Endoscopy;;  gastric polyp  . bunionectomy bilateral  feet    . COLONOSCOPY N/A 12/16/2016   Procedure: COLONOSCOPY;  Surgeon: Rogene Houston, MD;  [facility];  Service: Endoscopy;  Laterality: N/A;  2:15  . DENTAL SURGERY    . ESOPHAGOGASTRODUODENOSCOPY N/A 12/16/2016   Procedure: ESOPHAGOGASTRODUODENOSCOPY (EGD);  Surgeon: Rogene Houston, MD;  [facility];  Service: Endoscopy;  Laterality: N/A;  . gum disease  2010   treated by Dr. Andree Elk   . LAPAROTOMY N/A 01/31/2017   Procedure: EXPLORATORY LAPAROTOMY;  Surgeon: Aviva Signs, MD;  Location: [facility];  Service: General;  Laterality: N/A;  . Left knee manipulation  under anasthesia due to contracture -  03/08/08   Dr. Aline Brochure   . left knee replacement  08/14/08   Dr. Aline Brochure   . LYMPH NODE BIOPSY  01/31/2017   Procedure: LYMPH NODE BIOPSY;  Surgeon: Aviva Signs, MD;  Location: [facility];  Service: General;;  . PORTACATH PLACEMENT Left 02/21/2017   Procedure: INSERTION PORT-A-CATH;  Surgeon: Aviva Signs, MD;  Location: [facility];  Service: General;  Laterality: Left;  . TOTAL ABDOMINAL HYSTERECTOMY     bleeding   . TOTAL KNEE ARTHROPLASTY Right 05/12/2016   Procedure: TOTAL KNEE ARTHROPLASTY;  Surgeon: [name], MD;  Location: [facility];  Service: Orthopedics;  Laterality: Right;  . WISDOM TOOTH EXTRACTION       SOCIAL HISTORY:  Social History   Social History  . Marital status: Widowed    Spouse name: N/A  . Number of children: 3  . Years of education: college    Occupational History  . retired Chemical engineer  Retired    Social History Main Topics  . Smoking status: Former Smoker    Packs/day: 0.25    Years: 30.00    Quit date: 10/19/1999  . Smokeless tobacco: Former Systems developer  . Alcohol use No  . Drug use: No  . Sexual activity: Not Currently   Other Topics Concern  . Not on file   Social History Narrative  . No narrative on file    FAMILY HISTORY:  Family History  Problem Relation Age of Onset  . Colon cancer Father   . Cancer Mother   . Stroke Mother   . Hypertension Brother     CURRENT MEDICATIONS:  Outpatient Encounter Prescriptions as of 02/08/2017  Medication Sig Note  . acetaminophen (TYLENOL) 325 MG tablet Take 650 mg by mouth every 4 (four) hours as needed for mild pain.    Marland Kitchen aspirin EC 81 MG tablet Take 81 mg by mouth daily. 01/24/2017: On hold due to upcoming procedure.  . Calcium Carb-Cholecalciferol (CALCIUM 600+D) 600-800 MG-UNIT TABS Take 1 tablet by mouth 2 (two) times daily.   . diphenhydrAMINE (BENADRYL)  50 MG tablet Take 50 mg by mouth at bedtime as needed for itching.   Marland Kitchen lisinopril-hydrochlorothiazide (PRINZIDE,ZESTORETIC) 20-12.5 MG tablet TAKE ONE TABLET BY MOUTH ONCE DAILY   . magnesium hydroxide (MILK OF MAGNESIA) 400 MG/5ML suspension Take 30 mLs by mouth every other day.   . multivitamin (THERAGRAN) per tablet Take 1 tablet by mouth daily.   01/24/2017: Time varies  . pantoprazole (PROTONIX) 40 MG tablet Take 1 tablet (40 mg total) by mouth daily before breakfast.   . Pediatric Multivitamins-Iron (FLINTSTONES PLUS IRON) chewable tablet Chew 1 tablet by mouth 2 (two) times daily.   . polyethylene glycol (MIRALAX / GLYCOLAX) packet Take 17 g by mouth daily as needed (constipation).  12/14/2016: Hasn't started yet.  . traMADol (ULTRAM) 50 MG tablet Take 1 tablet (50 mg total) by mouth every 6 (six) hours as needed for moderate pain.    No facility-administered encounter medications on file as of 02/08/2017.     ALLERGIES:  Allergies  Allergen Reactions  . Codeine Hives   . Fosamax [Alendronate Sodium] Other (See Comments)    Pt reports excessive urination on the day she took the pill     PHYSICAL EXAM:  ECOG Performance status: 2 - Symptomatic; requires occasional assistance    Physical Exam  Constitutional: She is oriented to person, place, and time.  HENT:  Head: Normocephalic and atraumatic.  Mouth/Throat: Oropharynx is clear and moist.  Eyes: Conjunctivae are normal. Pupils are equal, round, and reactive to light.  Neck: Normal range of motion. Neck supple.  Cardiovascular: Normal rate and regular rhythm.  Exam reveals no gallop and no friction rub.   No murmur heard. Pulmonary/Chest: Effort normal. No respiratory distress. She has no wheezes. She has no rales. She exhibits no tenderness.  Abdominal: Soft. Bowel sounds are normal. She exhibits no distension. There is no tenderness. There is no rebound.  Musculoskeletal: Normal range of motion.  1+ bilateral ankle edema   Lymphadenopathy:    She has no axillary adenopathy.       Right: No supraclavicular adenopathy present.       Left: No supraclavicular adenopathy present.  Neurological: She is alert and oriented to person, place, and time.  Skin: Skin is warm and dry.  Psychiatric: Mood, memory, affect and judgment normal.  Nursing note and vitals reviewed.    LABORATORY DATA:  I have reviewed the labs as listed.  CBC    Component Value Date/Time   WBC 6.4 03/11/2017 0907   RBC 3.39 (L) 03/11/2017 0907   HGB 10.2 (L) 03/11/2017 0907   HCT 31.1 (L) 03/11/2017 0907   PLT 117 (L) 03/11/2017 0907   MCV 91.7 03/11/2017 0907   MCH 30.1 03/11/2017 0907   MCHC 32.8 03/11/2017 0907   RDW 15.6 (H) 03/11/2017 0907   LYMPHSABS 0.8 03/11/2017 0907   MONOABS 0.8 03/11/2017 0907   EOSABS 0.0 03/11/2017 0907   BASOSABS 0.1 03/11/2017 0907   CMP Latest Ref Rng & Units 03/11/2017 03/04/2017 03/01/2017  Glucose 65 - 99 mg/dL 118(H) 101(H) 141(H)  BUN 6 - 20 mg/dL 25(H) 30(H) 26(H)  Creatinine  0.44 - 1.00 mg/dL 0.91 1.06(H) 1.33(H)  Sodium 135 - 145 mmol/L 139 138 139  Potassium 3.5 - 5.1 mmol/L 3.4(L) 3.9 3.6  Chloride 101 - 111 mmol/L 101 100(L) 100(L)  CO2 22 - 32 mmol/L _0 Calcium 8.9 - 10.3 mg/dL 9.3 9.6 9.9  Total Protein 6.5 - 8.1 g/dL 6.5 6.4(L) 6.8  Total  Bilirubin 0.3 - 1.2 mg/dL 0.6 0.7 0.7  Alkaline Phos 38 - 126 U/L 58 68 57  AST 15 - 41 U/L 26 44(H) 47(H)  ALT 14 - 54 U/L 13(L) 15 12(L)    PENDING LABS:    DIAGNOSTIC IMAGING:  PET scan: 12/30/16       PATHOLOGY:  Lymph node biopsy: 01/31/17    Cytology: 01/31/17    ASSESSMENT & PLAN:  Stage IV Diffuse Large B-cell Lymphoma (DLBCL) with small pleural tumor focus on the posteromedial aspect of left chest; no bone marrow involvement. IPI score of 3. She is in the high to intermediate risk group. Predicted 4 year progression free survival rate is 43%.  PLAN: I have reviewed her bone marrow biopsy results with the patient her daughter in detail today. I have discussed with the patient that it is unclear whether her pleural tumor focus is due to her diffused large B-cell lymphoma, since it has not been biopsied, however overall I think this does not change her management since I do plan to give her 6 cycles of R CHOP since she does have a lot of bulky abdominal/pelvic disease. Plan to repeat PET/CT after cycle 2 of R CHOP to assess response to treatment. Patient tolerated cycle 1 of R CHOP very well without evidence of tumor lysis syndrome. I haven't encouraged her to increase mobility/ambulation at home. Patient verbalized understanding. Return to clinic on 03/28/17 for cycle 2 of chemotherapy in for follow-up as well.   All questions were answered to patient's stated satisfaction. Encouraged patient to call with any new concerns or questions before her next visit to the cancer center and we can certain see her sooner, if needed.      This note was electronically signed by:  Twana First,  MD 03/11/17

## 2017-03-15 LAB — CHROMOSOME ANALYSIS, BONE MARROW

## 2017-03-20 ENCOUNTER — Other Ambulatory Visit: Payer: Self-pay | Admitting: Family Medicine

## 2017-03-22 ENCOUNTER — Other Ambulatory Visit: Payer: Self-pay

## 2017-03-22 ENCOUNTER — Encounter (HOSPITAL_COMMUNITY): Payer: Self-pay | Admitting: Adult Health

## 2017-03-22 ENCOUNTER — Encounter (HOSPITAL_COMMUNITY): Payer: Medicare Other | Attending: Oncology

## 2017-03-22 ENCOUNTER — Encounter (HOSPITAL_BASED_OUTPATIENT_CLINIC_OR_DEPARTMENT_OTHER): Payer: Medicare Other | Admitting: Adult Health

## 2017-03-22 VITALS — BP 97/41 | HR 69 | Temp 98.2°F | Resp 20 | Wt 133.3 lb

## 2017-03-22 DIAGNOSIS — Z5111 Encounter for antineoplastic chemotherapy: Secondary | ICD-10-CM

## 2017-03-22 DIAGNOSIS — D500 Iron deficiency anemia secondary to blood loss (chronic): Secondary | ICD-10-CM | POA: Diagnosis not present

## 2017-03-22 DIAGNOSIS — C8330 Diffuse large B-cell lymphoma, unspecified site: Secondary | ICD-10-CM

## 2017-03-22 DIAGNOSIS — Z5112 Encounter for antineoplastic immunotherapy: Secondary | ICD-10-CM | POA: Diagnosis not present

## 2017-03-22 LAB — COMPREHENSIVE METABOLIC PANEL
ALK PHOS: 48 U/L (ref 38–126)
ALT: 12 U/L — ABNORMAL LOW (ref 14–54)
AST: 24 U/L (ref 15–41)
Albumin: 2.9 g/dL — ABNORMAL LOW (ref 3.5–5.0)
Anion gap: 8 (ref 5–15)
BILIRUBIN TOTAL: 0.6 mg/dL (ref 0.3–1.2)
BUN: 34 mg/dL — ABNORMAL HIGH (ref 6–20)
CALCIUM: 9.1 mg/dL (ref 8.9–10.3)
CO2: 29 mmol/L (ref 22–32)
Chloride: 103 mmol/L (ref 101–111)
Creatinine, Ser: 1.11 mg/dL — ABNORMAL HIGH (ref 0.44–1.00)
GFR calc non Af Amer: 46 mL/min — ABNORMAL LOW (ref >60)
GFR, EST AFRICAN AMERICAN: 53 mL/min — AB (ref >60)
GLUCOSE: 150 mg/dL — AB (ref 65–99)
Potassium: 3.8 mmol/L (ref 3.5–5.1)
Sodium: 140 mmol/L (ref 135–145)
TOTAL PROTEIN: 6.3 g/dL — AB (ref 6.5–8.1)

## 2017-03-22 LAB — CBC WITH DIFFERENTIAL/PLATELET
Basophils Absolute: 0.1 10*3/uL (ref 0.0–0.1)
Basophils Relative: 1 %
Eosinophils Absolute: 0 10*3/uL (ref 0.0–0.7)
Eosinophils Relative: 0 %
HEMATOCRIT: 27.4 % — AB (ref 36.0–46.0)
HEMOGLOBIN: 8.8 g/dL — AB (ref 12.0–15.0)
LYMPHS ABS: 0.8 10*3/uL (ref 0.7–4.0)
Lymphocytes Relative: 11 %
MCH: 30.3 pg (ref 26.0–34.0)
MCHC: 32.1 g/dL (ref 30.0–36.0)
MCV: 94.5 fL (ref 78.0–100.0)
Monocytes Absolute: 0.9 10*3/uL (ref 0.1–1.0)
Monocytes Relative: 13 %
NEUTROS ABS: 5.2 10*3/uL (ref 1.7–7.7)
NEUTROS PCT: 75 %
Platelets: 432 10*3/uL — ABNORMAL HIGH (ref 150–400)
RBC: 2.9 MIL/uL — ABNORMAL LOW (ref 3.87–5.11)
RDW: 16.8 % — ABNORMAL HIGH (ref 11.5–15.5)
WBC: 6.9 10*3/uL (ref 4.0–10.5)

## 2017-03-22 LAB — PHOSPHORUS: PHOSPHORUS: 2.7 mg/dL (ref 2.5–4.6)

## 2017-03-22 LAB — LACTATE DEHYDROGENASE: LDH: 171 U/L (ref 98–192)

## 2017-03-22 LAB — URIC ACID: Uric Acid, Serum: 3.1 mg/dL (ref 2.3–6.6)

## 2017-03-22 MED ORDER — SODIUM CHLORIDE 0.9 % IV SOLN
375.0000 mg/m2 | Freq: Once | INTRAVENOUS | Status: AC
  Administered 2017-03-22: 700 mg via INTRAVENOUS
  Filled 2017-03-22: qty 20

## 2017-03-22 MED ORDER — PEGFILGRASTIM 6 MG/0.6ML ~~LOC~~ PSKT
6.0000 mg | PREFILLED_SYRINGE | Freq: Once | SUBCUTANEOUS | Status: AC
  Administered 2017-03-22: 6 mg via SUBCUTANEOUS
  Filled 2017-03-22: qty 0.6

## 2017-03-22 MED ORDER — HEPARIN SOD (PORK) LOCK FLUSH 100 UNIT/ML IV SOLN
500.0000 [IU] | Freq: Once | INTRAVENOUS | Status: AC | PRN
  Administered 2017-03-22: 500 [IU]
  Filled 2017-03-22: qty 5

## 2017-03-22 MED ORDER — VINCRISTINE SULFATE CHEMO INJECTION 1 MG/ML
2.0000 mg | Freq: Once | INTRAVENOUS | Status: AC
  Administered 2017-03-22: 2 mg via INTRAVENOUS
  Filled 2017-03-22: qty 2

## 2017-03-22 MED ORDER — PALONOSETRON HCL INJECTION 0.25 MG/5ML
0.2500 mg | Freq: Once | INTRAVENOUS | Status: AC
  Administered 2017-03-22: 0.25 mg via INTRAVENOUS
  Filled 2017-03-22: qty 5

## 2017-03-22 MED ORDER — ACETAMINOPHEN 325 MG PO TABS
650.0000 mg | ORAL_TABLET | Freq: Once | ORAL | Status: AC
  Administered 2017-03-22: 650 mg via ORAL
  Filled 2017-03-22: qty 2

## 2017-03-22 MED ORDER — DIPHENHYDRAMINE HCL 25 MG PO CAPS
50.0000 mg | ORAL_CAPSULE | Freq: Once | ORAL | Status: AC
  Administered 2017-03-22: 50 mg via ORAL
  Filled 2017-03-22: qty 2

## 2017-03-22 MED ORDER — DEXAMETHASONE SODIUM PHOSPHATE 100 MG/10ML IJ SOLN: 10.0000 mg | Freq: Once | INTRAMUSCULAR | Status: DC

## 2017-03-22 MED ORDER — DEXAMETHASONE SODIUM PHOSPHATE 10 MG/ML IJ SOLN
10.0000 mg | Freq: Once | INTRAMUSCULAR | Status: AC
  Administered 2017-03-22: 10 mg via INTRAVENOUS
  Filled 2017-03-22: qty 1

## 2017-03-22 MED ORDER — DOXORUBICIN HCL CHEMO IV INJECTION 2 MG/ML
50.0000 mg/m2 | Freq: Once | INTRAVENOUS | Status: AC
  Administered 2017-03-22: 90 mg via INTRAVENOUS
  Filled 2017-03-22: qty 45

## 2017-03-22 MED ORDER — SODIUM CHLORIDE 0.9 % IV SOLN
750.0000 mg/m2 | Freq: Once | INTRAVENOUS | Status: AC
  Administered 2017-03-22: 1340 mg via INTRAVENOUS
  Filled 2017-03-22: qty 50

## 2017-03-22 MED ORDER — SODIUM CHLORIDE 0.9 % IV SOLN
Freq: Once | INTRAVENOUS | Status: AC
  Administered 2017-03-22: 12:00:00 via INTRAVENOUS

## 2017-03-22 NOTE — Progress Notes (Signed)
Noticed that patient had irregular radial pulse, provider aware. Order for EKG. Kirby Crigler, PA-C reviewed EKG. No new orders.  Neulasta On-Pro placed to left upper arm fatty tissue, green light on and chamber full.  Patient tolerated treatment today. No complaints or reactions to medications.  Patient assisted to restroom prior to discharge.  Patient discharged via wheelchair to home with daughter in stable condition.

## 2017-03-22 NOTE — Patient Instructions (Addendum)
[place] at [facility] Discharge Instructions  RECOMMENDATIONS MADE BY THE CONSULTANT AND ANY TEST RESULTS WILL BE SENT TO YOUR REFERRING PHYSICIAN.  You got your second cycle of chemotherapy today Return as previously scheduled  The Neulasta On Pro will inject medication around 8:30pm tomorrow. You can remove it from your arm around 10pm  Thank you for choosing [place] at [facility] to provide your oncology and hematology care.  To afford each patient quality time with our provider, please arrive at least 15 minutes before your scheduled appointment time.    If you have a lab appointment with the [facility] please come in thru the  Main Entrance and check in at the main information desk  You need to re-schedule your appointment should you arrive 10 or more minutes late.  We strive to give you quality time with our providers, and arriving late affects you and other patients whose appointments are after yours.  Also, if you no show three or more times for appointments you may be dismissed from the clinic at the providers discretion.     Again, thank you for choosing [facility].  Our hope is that these requests will decrease the amount of time that you wait before being seen by our physicians.       _____________________________________________________________  Should you have questions after your visit to [facility], please contact our office at [phone] between the hours of 8:30 a.m. and 4:30 p.m.  Voicemails left after 4:30 p.m. will not be returned until the following business day.  For prescription refill requests, have your pharmacy contact our office.       Resources For Cancer Patients and their Caregivers ? American Cancer Society: Can assist with transportation, wigs, general needs, runs Look Good Feel Better.        808-645-4582 ? Cancer Care: Provides financial assistance, online  support groups, medication/co-pay assistance.  1-800-813-HOPE 667-056-4455) ? [place] Assists [place] cancer patients and their families through emotional , educational and financial support.  308-384-8337 ? [place] DSS Where to apply for food stamps, Medicaid and utility assistance. 765-603-5601 ? RCATS: Transportation to medical appointments. (332)339-5683 ? Social Security Administration: May apply for disability if have a Stage IV cancer. 916-662-1977 204-686-8245 ? [employer], Disability and Transit Services: Assists with nutrition, care and transit needs. [place] Support Programs: @10RELATIVEDAYS @ > Cancer Support Group  2nd Tuesday of the month 1pm-2pm, Journey Room  > Creative Journey  3rd Tuesday of the month 1130am-1pm, Journey Room  > Look Good Feel Better  1st Wednesday of the month 10am-12 noon, Journey Room (Call [place] to register [phone])

## 2017-03-22 NOTE — Patient Instructions (Addendum)
[place] at [facility] Discharge Instructions  RECOMMENDATIONS MADE BY THE CONSULTANT AND ANY TEST RESULTS WILL BE SENT TO YOUR REFERRING PHYSICIAN.  You were seen today by Mike Craze NP. Continue Chemo every 21 days. Return on Day 1 of next Chemo cycle.   Thank you for choosing [place] at [facility] to provide your oncology and hematology care.  To afford each patient quality time with our provider, please arrive at least 15 minutes before your scheduled appointment time.    If you have a lab appointment with the [facility] please come in thru the  Main Entrance and check in at the main information desk  You need to re-schedule your appointment should you arrive 10 or more minutes late.  We strive to give you quality time with our providers, and arriving late affects you and other patients whose appointments are after yours.  Also, if you no show three or more times for appointments you may be dismissed from the clinic at the providers discretion.     Again, thank you for choosing [facility].  Our hope is that these requests will decrease the amount of time that you wait before being seen by our physicians.       _____________________________________________________________  Should you have questions after your visit to [facility], please contact our office at [phone] between the hours of 8:30 a.m. and 4:30 p.m.  Voicemails left after 4:30 p.m. will not be returned until the following business day.  For prescription refill requests, have your pharmacy contact our office.       Resources For Cancer Patients and their Caregivers ? American Cancer Society: Can assist with transportation, wigs, general needs, runs Look Good Feel Better.        765-801-6774 ? Cancer Care: Provides financial assistance, online support groups, medication/co-pay assistance.  1-800-813-HOPE 5132275533) ? [place] Assists [place] cancer patients and their families through emotional , educational and financial support.  (272)491-7919 ? [place] DSS Where to apply for food stamps, Medicaid and utility assistance. (640)027-8183 ? [employer]: Transportation to medical appointments. 616-321-9019 ? Social Security Administration: May apply for disability if have a Stage IV cancer. 367 423 4253 (971)551-7315 ? [employer], Disability and Transit Services: Assists with nutrition, care and transit needs. [facility] Support Programs: @10RELATIVEDAYS @ > Cancer Support Group  2nd Tuesday of the month 1pm-2pm, Journey Room  > Creative Journey  3rd Tuesday of the month 1130am-1pm, Journey Room  > Look Good Feel Better  1st Wednesday of the month 10am-12 noon, Journey Room (Call [place] to register [phone])

## 2017-03-22 NOTE — Progress Notes (Signed)
[place], [place] [address]   CLINIC:  Medical Oncology/Hematology  PCP:  Fayrene Helper, MD [address], [address] [place] [address] [phone]   REASON FOR VISIT:  Follow-up for Diffuse Large B-cell Lymphoma (DLBCL):   CURRENT THERAPY: R-CHOP chemotherapy, beginning 03/01/17   BRIEF ONCOLOGIC HISTORY:    Diffuse large B cell lymphoma ([facility])   12/20/2016 Imaging    CT abd/pelvis: IMPRESSION: 1. Extensive abdominal, pelvic and retrocrural lymphadenopathy, most pronounced in the retroperitoneum, as detailed above. Findings are highly suspicious for lymphoproliferative disorder and further evaluation is strongly recommended at this time. 2. Colonic diverticulosis without evidence of acute diverticulitis at this time. 3. Aortic atherosclerosis. 4. Additional incidental findings, as above.      12/30/2016 PET scan    IMPRESSION: 1. Extensive hypermetabolic abdominal and pelvic adenopathy, including bulky retroperitoneal adenopathy lifting of the abdominal aorta away from the spine. One of the larger lymph nodes measures 8.4 cm in short axis with maximum SUV 25.0. 2. Small pleural focus of tumor in the left posteromedial chest, maximum SUV only 4.2. This represents the only thoracic involvement. 3. Other imaging findings of potential clinical significance: Atherosclerotic aortic arch. Mild cardiomegaly. Mild atelectasis in both lower lobes. Hyperdense left renal cysts as on prior CT. Mild left hydronephrosis likely due to extrinsic mass effect on the left ureter. Right greater than left degenerative arthropathy of the hips.      01/31/2017 Surgery    Exploratory lap Arnoldo Morale)       01/31/2017 Pathology Results    Lymph node biospy: DLBCL, high-grade, monoclonal kappa restricted.       02/07/2017 Initial Diagnosis    Diffuse large B cell lymphoma ([facility])     02/14/2017 Echocardiogram    Pre-chemo ECHO: EF 55%.       03/01/2017 Bone Marrow Biopsy    No bone marrow involvement by lymphoma. [facility] cytogenetics      03/01/2017 -  Chemotherapy    The patient had DOXOrubicin (ADRIAMYCIN) chemo injection 90 mg, 50 mg/m2 = 90 mg, Intravenous,  Once, 1 of 6 cycles  palonosetron (ALOXI) injection 0.25 mg, 0.25 mg, Intravenous,  Once, 1 of 6 cycles  pegfilgrastim (NEULASTA ONPRO [place]) injection 6 mg, 6 mg, Subcutaneous, Once, 1 of 6 cycles  vinCRIStine (ONCOVIN) 2 mg in sodium chloride 0.9 % 50 mL chemo infusion, 2 mg, Intravenous,  Once, 1 of 6 cycles  riTUXimab (RITUXAN) 700 mg in sodium chloride 0.9 % 250 mL (2.1875 mg/mL) chemo infusion, 375 mg/m2 = 700 mg, Intravenous,  Once, 1 of 6 cycles Dose modification: 375 mg/m2 (original dose 375 mg/m2, Cycle 1)  cyclophosphamide (CYTOXAN) 1,340 mg in sodium chloride 0.9 % 250 mL chemo infusion, 750 mg/m2 = 1,340 mg, Intravenous,  Once, 1 of 6 cycles  for chemotherapy treatment.          INTERVAL HISTORY:  Ms. Padgett 83 y.o. female returns for follow-up and consideration for next cycle chemotherapy.   She is here today with her daughter. Due for cycle #2 R-CHOP today.   Overall, she tells me that she "feels pretty good." Does report that her appetite and energy levels are "a little low" at 25%. Does endorse fatigue. Denies fever. States that she has periodic chills, but her daughter states that "she always is cold. She keeps her house at 80 degrees all the time to be comfortable."  Ms. Aramburo tells me that she checks her temperature regularly "just  like I'm supposed to" and has no reported fevers.  Denies any nausea or vomiting. Her bowels are moving well (generally every other day).  Endorses occasional dizziness, particularly when going from seated to standing position.  She is trying to drink plenty of water, but admits she could probably do better.   Otherwise, she is largely without complaints today and feels ready for her next cycle of chemotherapy. Continues  to ambulate with her walker, as tolerated. Her daughter helps her quite a bit as well.      REVIEW OF SYSTEMS:  Review of Systems  Constitutional: Positive for appetite change, chills and fatigue. Negative for fever.  HENT:  Negative.  Negative for lump/mass and nosebleeds.   Eyes: Negative.   Respiratory: Negative.  Negative for cough and shortness of breath.   Cardiovascular: Negative.  Negative for chest pain and leg swelling.  Gastrointestinal: Negative.  Negative for abdominal pain, blood in stool, constipation, diarrhea, nausea and vomiting.  Endocrine: Negative.   Genitourinary: Negative.  Negative for dysuria and hematuria.   Musculoskeletal: Negative.  Negative for arthralgias.  Skin: Negative.  Negative for rash.  Neurological: Positive for dizziness. Negative for headaches.  Hematological: Negative.  Negative for adenopathy. Does not bruise/bleed easily.  Psychiatric/Behavioral: Negative.  Negative for depression and sleep disturbance. The patient is not nervous/anxious.      PAST MEDICAL/SURGICAL HISTORY:  Past Medical History:  Diagnosis Date  . Arthritis of knee    bilateral    . Chronic renal insufficiency   . Diabetes mellitus, type 2 ([facility])   . GERD (gastroesophageal reflux disease)   . History of kidney stones   . Hypertension   . Iron deficiency anemia 12/28/2016  . Lymphadenopathy, abdominal 12/28/2016  . Osteopenia   . PAD (peripheral artery disease) ([facility])   . Personal history of colonic polyps 3 years ago    colonoscopy    Past Surgical History:  Procedure Laterality Date  . BIOPSY  12/16/2016   Procedure: BIOPSY;  Surgeon: Rogene Houston, MD;  [facility];  Service: Endoscopy;;  gastric polyp  . bunionectomy bilateral  feet    . COLONOSCOPY N/A 12/16/2016   Procedure: COLONOSCOPY;  Surgeon: Rogene Houston, MD;  [facility];  Service: Endoscopy;  Laterality: N/A;  2:15  . DENTAL SURGERY    . ESOPHAGOGASTRODUODENOSCOPY N/A  12/16/2016   Procedure: ESOPHAGOGASTRODUODENOSCOPY (EGD);  Surgeon: Rogene Houston, MD;  [facility];  Service: Endoscopy;  Laterality: N/A;  . gum disease  2010   treated by Dr. Andree Elk   . LAPAROTOMY N/A 01/31/2017   Procedure: EXPLORATORY LAPAROTOMY;  Surgeon: Aviva Signs, MD;  Location: [facility];  Service: General;  Laterality: N/A;  . Left knee manipulation  under anasthesia due to contracture -  03/08/08   Dr. Aline Brochure   . left knee replacement  08/14/08   Dr. Aline Brochure   . LYMPH NODE BIOPSY  01/31/2017   Procedure: LYMPH NODE BIOPSY;  Surgeon: Aviva Signs, MD;  Location: [facility];  Service: General;;  . PORTACATH PLACEMENT Left 02/21/2017   Procedure: INSERTION PORT-A-CATH;  Surgeon: Aviva Signs, MD;  Location: [facility];  Service: General;  Laterality: Left;  . TOTAL ABDOMINAL HYSTERECTOMY     bleeding   . TOTAL KNEE ARTHROPLASTY Right 05/12/2016   Procedure: TOTAL KNEE ARTHROPLASTY;  Surgeon: [name], MD;  Location: [facility];  Service: Orthopedics;  Laterality: Right;  . WISDOM TOOTH EXTRACTION  SOCIAL HISTORY:  Social History   Social History  . Marital status: Widowed    Spouse name: N/A  . Number of children: 3  . Years of education: college    Occupational History  . retired Chemical engineer  Retired   Social History Main Topics  . Smoking status: Former Smoker    Packs/day: 0.25    Years: 30.00    Quit date: 10/19/1999  . Smokeless tobacco: Former Systems developer  . Alcohol use No  . Drug use: No  . Sexual activity: Not Currently   Other Topics Concern  . Not on file   Social History Narrative  . No narrative on file    FAMILY HISTORY:  Family History  Problem Relation Age of Onset  . Colon cancer Father   . Cancer Mother   . Stroke Mother   . Hypertension Brother     CURRENT MEDICATIONS:  Outpatient Encounter Prescriptions as of 03/22/2017  Medication Sig Note  . acetaminophen (TYLENOL) 325 MG tablet Take 650 mg by mouth every 4 (four) hours as  needed for mild pain.    Marland Kitchen acyclovir (ZOVIRAX) 400 MG tablet Take 1 tablet (400 mg total) by mouth 2 (two) times daily.   Marland Kitchen allopurinol (ZYLOPRIM) 300 MG tablet Take 1 tablet (300 mg total) by mouth daily.   Marland Kitchen aspirin EC 81 MG tablet Take 81 mg by mouth daily.   . Calcium Carb-Cholecalciferol (CALCIUM 600+D) 600-800 MG-UNIT TABS Take 1 tablet by mouth 2 (two) times daily.   . CYCLOPHOSPHAMIDE IV Inject into the vein. Every 3 weeks 02/11/2017: Not started yet  . diphenhydrAMINE (BENADRYL) 50 MG tablet Take 50 mg by mouth at bedtime as needed for itching.   Marland Kitchen DOXOrubicin HCl (ADRIAMYCIN IV) Inject into the vein. Every 3 weeks 02/11/2017: Not started yet  . lidocaine-prilocaine (EMLA) cream Apply to affected area once 02/11/2017: Not started yet  . lisinopril-hydrochlorothiazide (PRINZIDE,ZESTORETIC) 20-12.5 MG tablet TAKE ONE TABLET BY MOUTH ONCE DAILY   . magnesium hydroxide (MILK OF MAGNESIA) 400 MG/5ML suspension Take 30 mLs by mouth every other day.   . multivitamin (THERAGRAN) per tablet Take 1 tablet by mouth daily.   01/24/2017: Time varies  . ondansetron (ZOFRAN) 8 MG tablet Take 1 tablet (8 mg total) by mouth 2 (two) times daily as needed for refractory nausea / vomiting. Start on day 3 after cyclophosphamide chemotherapy. 02/11/2017: Not started yet  . pantoprazole (PROTONIX) 40 MG tablet Take 1 tablet (40 mg total) by mouth daily before breakfast.   . Pediatric Multivitamins-Iron (FLINTSTONES PLUS IRON) chewable tablet Chew 1 tablet by mouth 2 (two) times daily.   . Pegfilgrastim (NEULASTA ONPRO [place]) Inject into the skin. Every 3 weeks 02/11/2017: Not started yet  . predniSONE (DELTASONE) 20 MG tablet Take 3.5 tabs (70 mg) by mouth in the morning with breakfast on Days 1-5 of chemotherapy. 02/11/2017: Not started yet  . prochlorperazine (COMPAZINE) 10 MG tablet Take 1 tablet (10 mg total) by mouth every 6 (six) hours as needed (Nausea or vomiting). 02/11/2017: Not started yet  . RiTUXimab (RITUXAN  IV) Inject into the vein. Every 3 weeks 02/11/2017: Not started yet   . traMADol (ULTRAM) 50 MG tablet Take 1 tablet (50 mg total) by mouth every 6 (six) hours as needed for moderate pain.   . vinCRIStine 2 mg in sodium chloride 0.9 % 50 mL Inject into the vein once. Every 3 weeks 02/11/2017: Not started yet   No facility-administered encounter medications on file as  of 03/22/2017.     ALLERGIES:  Allergies  Allergen Reactions  . Codeine Hives  . Fosamax [Alendronate Sodium] Other (See Comments)    Pt reports excessive urination on the day she took the pill     PHYSICAL EXAM:  ECOG Performance status: 2 - Symptomatic; requires occasional assistance       Physical Exam  Constitutional: She is oriented to person, place, and time.  Frail, elderly female seen in chemo bed in no acute distress   HENT:  Head: Normocephalic.  Mouth/Throat: Oropharynx is clear and moist.  Eyes: Conjunctivae are normal. No scleral icterus.  Neck: Normal range of motion.  (L) supraclavicular mass measuring about 2 cm.   Cardiovascular: Normal rate and regular rhythm.   Pulmonary/Chest: Effort normal. No respiratory distress.  Diminished breath sounds to bilat bases   Abdominal: Soft. Bowel sounds are normal. There is no tenderness. There is no rebound and no guarding.  Musculoskeletal: Normal range of motion. She exhibits no edema.  Lymphadenopathy:    She has no cervical adenopathy.       Right: No supraclavicular adenopathy present.       Left: Supraclavicular adenopathy present.  Neurological: She is alert and oriented to person, place, and time. No cranial nerve deficit.  Skin: Skin is warm and dry. No rash noted.  Psychiatric: Mood, memory, affect and judgment normal.  Nursing note and vitals reviewed.    LABORATORY DATA:  I have reviewed the labs as listed.  CBC    Component Value Date/Time   WBC 6.4 03/11/2017 0907   RBC 3.39 (L) 03/11/2017 0907   HGB 10.2 (L) 03/11/2017 0907   HCT  31.1 (L) 03/11/2017 0907   PLT 117 (L) 03/11/2017 0907   MCV 91.7 03/11/2017 0907   MCH 30.1 03/11/2017 0907   MCHC 32.8 03/11/2017 0907   RDW 15.6 (H) 03/11/2017 0907   LYMPHSABS 0.8 03/11/2017 0907   MONOABS 0.8 03/11/2017 0907   EOSABS 0.0 03/11/2017 0907   BASOSABS 0.1 03/11/2017 0907   CMP Latest Ref Rng & Units 03/11/2017 03/04/2017 03/01/2017  Glucose 65 - 99 mg/dL 118(H) 101(H) 141(H)  BUN 6 - 20 mg/dL 25(H) 30(H) 26(H)  Creatinine 0.44 - 1.00 mg/dL 0.91 1.06(H) 1.33(H)  Sodium 135 - 145 mmol/L 139 138 139  Potassium 3.5 - 5.1 mmol/L 3.4(L) 3.9 3.6  Chloride 101 - 111 mmol/L 101 100(L) 100(L)  CO2 22 - 32 mmol/L 30 30 30   Calcium 8.9 - 10.3 mg/dL 9.3 9.6 9.9  Total Protein 6.5 - 8.1 g/dL 6.5 6.4(L) 6.8  Total Bilirubin 0.3 - 1.2 mg/dL 0.6 0.7 0.7  Alkaline Phos 38 - 126 U/L 58 68 57  AST 15 - 41 U/L 26 44(H) 47(H)  ALT 14 - 54 U/L 13(L) 15 12(L)    PENDING LABS:    DIAGNOSTIC IMAGING:  PET scan: 12/30/16      ECHO: 02/14/17     PATHOLOGY:  Lymph node biopsy: 01/31/17    Cytology: 01/31/17     Bone marrow biopsy: 03/01/17           ASSESSMENT & PLAN:   Stage II Diffuse Large B-cell Lymphoma (DLBCL):  -Recent exploratory laparotomy with Dr. Arnoldo Morale confirms suspected lymphoma, specifically diffuse large B-cell lymphoma.  -There is (L) supraclavicular adenopathy appreciated on physical exam today, which is new from previous exam. She has only completed 1 cycle of R-CHOP, thus will continue to monitor for now.  Could be reactive lymph nodes vs lymphoma.  -  Labs reviewed and are adequate for treatment today.  -Return to cancer center in 3 weeks for next cycle chemo and follow-up visit.   At risk for tumor lysis syndrome:  -Given bulky disease, she was at considerable risk of tumor lysis syndrome. This risk decreases with subsequent cycles of chemotherapy.  -Continue prophylactic Allopurinol.   Viral prophylaxis:  -Continue Acyclovir as scheduled.          Dispo:  -Return to cancer center in 3 weeks for follow-up and next cycle of chemotherapy.     All questions were answered to patient's stated satisfaction. Encouraged patient to call with any new concerns or questions before her next visit to the cancer center and we can certain see her sooner, if needed.     Plan of care discussed with Dr. Talbert Cage, who agrees with the above aforementioned.    Orders placed this encounter:  No orders of the defined types were placed in this encounter.     Mike Craze, NP [place] [phone]

## 2017-03-23 ENCOUNTER — Ambulatory Visit (INDEPENDENT_AMBULATORY_CARE_PROVIDER_SITE_OTHER): Payer: Medicare Other

## 2017-03-23 VITALS — BP 110/64 | HR 62 | Temp 98.6°F | Ht 66.0 in | Wt 143.1 lb

## 2017-03-23 DIAGNOSIS — Z0000 Encounter for general adult medical examination without abnormal findings: Secondary | ICD-10-CM

## 2017-03-23 NOTE — Patient Instructions (Signed)
[name] , Thank you for taking time to come for your Medicare Wellness Visit. I appreciate your ongoing commitment to your health goals. Please review the following plan we discussed and let me know if I can assist you in the future.   Screening recommendations/referrals: Colonoscopy: Up to date and is no longer required Mammogram: Up to date and is no longer required Bone Density: Up to date Diabetic Eye Exam: Overdue, please schedule with Dr. Marnette Burgess as soon as possible  Recommended yearly dental visit for hygiene and checkup  Vaccinations: Influenza vaccine: Up to date, next due 05/2017 Pneumococcal vaccine: Up to date Tdap vaccine: Up to date, next due 08/2019 Shingles vaccine: Done    Advanced directives: Advance directive discussed with you today. I have provided a copy for you to complete at home and have notarized. Once this is complete please bring a copy in to our office so we can scan it into your chart.  Next appointment: Follow up with Dr. Moshe Cipro on 04/21/2017 at 10:20 am. Follow up in 1 year for your annual wellness visit.   Preventive Care 25 Years and Older, Female Preventive care refers to lifestyle choices and visits with your health care provider that can promote health and wellness. What does preventive care include?  A yearly physical exam. This is also called an annual well check.  Dental exams once or twice a year.  Routine eye exams. Ask your health care provider how often you should have your eyes checked.  Personal lifestyle choices, including:  Daily care of your teeth and gums.  Regular physical activity.  Eating a healthy diet.  Avoiding tobacco and drug use.  Limiting alcohol use.  Practicing safe sex.  Taking low-dose aspirin every day.  Taking vitamin and mineral supplements as recommended by your health care provider. What happens during an annual well check? The services and screenings done by your health care provider during your  annual well check will depend on your age, overall health, lifestyle risk factors, and family history of disease. Counseling  Your health care provider may ask you questions about your:  Alcohol use.  Tobacco use.  Drug use.  Emotional well-being.  Home and relationship well-being.  Sexual activity.  Eating habits.  History of falls.  Memory and ability to understand (cognition).  Work and work Statistician.  Reproductive health. Screening  You may have the following tests or measurements:  Height, weight, and BMI.  Blood pressure.  Lipid and cholesterol levels. These may be checked every 5 years, or more frequently if you are over 66 years old.  Skin check.  Lung cancer screening. You may have this screening every year starting at age 5 if you have a 30-pack-year history of smoking and currently smoke or have quit within the past 15 years.  Fecal occult blood test (FOBT) of the stool. You may have this test every year starting at age 90.  Flexible sigmoidoscopy or colonoscopy. You may have a sigmoidoscopy every 5 years or a colonoscopy every 10 years starting at age 66.  Hepatitis C blood test.  Hepatitis B blood test.  Sexually transmitted disease (STD) testing.  Diabetes screening. This is done by checking your blood sugar (glucose) after you have not eaten for a while (fasting). You may have this done every 1-3 years.  Bone density scan. This is done to screen for osteoporosis. You may have this done starting at age 81.  Mammogram. This may be done every 1-2 years. Talk to  your health care provider about how often you should have regular mammograms. Talk with your health care provider about your test results, treatment options, and if necessary, the need for more tests. Vaccines  Your health care provider may recommend certain vaccines, such as:  Influenza vaccine. This is recommended every year.  Tetanus, diphtheria, and acellular pertussis (Tdap, Td)  vaccine. You may need a Td booster every 10 years.  Zoster vaccine. You may need this after age 55.  Pneumococcal 13-valent conjugate (PCV13) vaccine. One dose is recommended after age 55.  Pneumococcal polysaccharide (PPSV23) vaccine. One dose is recommended after age 5. Talk to your health care provider about which screenings and vaccines you need and how often you need them. This information is not intended to replace advice given to you by your health care provider. Make sure you discuss any questions you have with your health care provider. Document Released: 10/31/2015 Document Revised: 06/23/2016 Document Reviewed: 08/05/2015 Elsevier Interactive Patient Education  2017 [place] Prevention in the Home Falls can cause injuries. They can happen to people of all ages. There are many things you can do to make your home safe and to help prevent falls. What can I do on the outside of my home?  Regularly fix the edges of walkways and driveways and fix any cracks.  Remove anything that might make you trip as you walk through a door, such as a raised step or threshold.  Trim any bushes or trees on the path to your home.  Use bright outdoor lighting.  Clear any walking paths of anything that might make someone trip, such as rocks or tools.  Regularly check to see if handrails are loose or broken. Make sure that both sides of any steps have handrails.  Any raised decks and porches should have guardrails on the edges.  Have any leaves, snow, or ice cleared regularly.  Use sand or salt on walking paths during winter.  Clean up any spills in your garage right away. This includes oil or grease spills. What can I do in the bathroom?  Use night lights.  Install grab bars by the toilet and in the tub and shower. Do not use towel bars as grab bars.  Use non-skid mats or decals in the tub or shower.  If you need to sit down in the shower, use a plastic, non-slip  stool.  Keep the floor dry. Clean up any water that spills on the floor as soon as it happens.  Remove soap buildup in the tub or shower regularly.  Attach bath mats securely with double-sided non-slip rug tape.  Do not have throw rugs and other things on the floor that can make you trip. What can I do in the bedroom?  Use night lights.  Make sure that you have a light by your bed that is easy to reach.  Do not use any sheets or blankets that are too big for your bed. They should not hang down onto the floor.  Have a firm chair that has side arms. You can use this for support while you get dressed.  Do not have throw rugs and other things on the floor that can make you trip. What can I do in the kitchen?  Clean up any spills right away.  Avoid walking on wet floors.  Keep items that you use a lot in easy-to-reach places.  If you need to reach something above you, use a strong step stool that has  a grab bar.  Keep electrical cords out of the way.  Do not use floor polish or wax that makes floors slippery. If you must use wax, use non-skid floor wax.  Do not have throw rugs and other things on the floor that can make you trip. What can I do with my stairs?  Do not leave any items on the stairs.  Make sure that there are handrails on both sides of the stairs and use them. Fix handrails that are broken or loose. Make sure that handrails are as long as the stairways.  Check any carpeting to make sure that it is firmly attached to the stairs. Fix any carpet that is loose or worn.  Avoid having throw rugs at the top or bottom of the stairs. If you do have throw rugs, attach them to the floor with carpet tape.  Make sure that you have a light switch at the top of the stairs and the bottom of the stairs. If you do not have them, ask someone to add them for you. What else can I do to help prevent falls?  Wear shoes that:  Do not have high heels.  Have rubber bottoms.  Are  comfortable and fit you well.  Are closed at the toe. Do not wear sandals.  If you use a stepladder:  Make sure that it is fully opened. Do not climb a closed stepladder.  Make sure that both sides of the stepladder are locked into place.  Ask someone to hold it for you, if possible.  Clearly mark and make sure that you can see:  Any grab bars or handrails.  First and last steps.  Where the edge of each step is.  Use tools that help you move around (mobility aids) if they are needed. These include:  Canes.  Walkers.  Scooters.  Crutches.  Turn on the lights when you go into a dark area. Replace any light bulbs as soon as they burn out.  Set up your furniture so you have a clear path. Avoid moving your furniture around.  If any of your floors are uneven, fix them.  If there are any pets around you, be aware of where they are.  Review your medicines with your doctor. Some medicines can make you feel dizzy. This can increase your chance of falling. Ask your doctor what other things that you can do to help prevent falls. This information is not intended to replace advice given to you by your health care provider. Make sure you discuss any questions you have with your health care provider. Document Released: 07/31/2009 Document Revised: 03/11/2016 Document Reviewed: 11/08/2014 Elsevier Interactive Patient Education  2017 [employer].

## 2017-03-23 NOTE — Progress Notes (Signed)
Subjective:   RHENA GLACE is a 83 y.o. female who presents for Medicare Annual (Subsequent) preventive examination.  Review of Systems:  Cardiac Risk Factors include: advanced age (>34men, >45 women);diabetes mellitus;hypertension;sedentary lifestyle     Objective:     Vitals: BP 110/64   Pulse 62   Temp 98.6 F (37 C) (Oral)   Ht 5\' 6"  (1.676 m)   Wt 143 lb 1.9 oz (64.9 kg)   SpO2 93%   BMI 23.10 kg/m   Body mass index is 23.1 kg/m.   Tobacco History  Smoking Status  . Former Smoker  . Packs/day: 0.25  . Years: 30.00  . Types: Cigarettes  . Quit date: 10/19/1999  Smokeless Tobacco  . Never Used     Counseling given: Not Answered   Past Medical History:  Diagnosis Date  . Arthritis of knee    bilateral    . Chronic renal insufficiency   . Diabetes mellitus, type 2 ([place])   . GERD (gastroesophageal reflux disease)   . History of kidney stones   . Hypertension   . Iron deficiency anemia 12/28/2016  . Lymphadenopathy, abdominal 12/28/2016  . Osteopenia   . PAD (peripheral artery disease) ([place])   . Personal history of colonic polyps 3 years ago    colonoscopy    Past Surgical History:  Procedure Laterality Date  . BIOPSY  12/16/2016   Procedure: BIOPSY;  Surgeon: Rogene Houston, MD;  [facility];  Service: Endoscopy;;  gastric polyp  . bunionectomy bilateral  feet    . COLONOSCOPY N/A 12/16/2016   Procedure: COLONOSCOPY;  Surgeon: Rogene Houston, MD;  [facility];  Service: Endoscopy;  Laterality: N/A;  2:15  . DENTAL SURGERY    . ESOPHAGOGASTRODUODENOSCOPY N/A 12/16/2016   Procedure: ESOPHAGOGASTRODUODENOSCOPY (EGD);  Surgeon: Rogene Houston, MD;  [facility];  Service: Endoscopy;  Laterality: N/A;  . gum disease  2010   treated by Dr. Andree Elk   . LAPAROTOMY N/A 01/31/2017   Procedure: EXPLORATORY LAPAROTOMY;  Surgeon: Aviva Signs, MD;  [facility]: [facility];  Service: General;  Laterality: N/A;  . Left knee manipulation   under anasthesia due to contracture -  03/08/08   Dr. Aline Brochure   . left knee replacement  08/14/08   Dr. Aline Brochure   . LYMPH NODE BIOPSY  01/31/2017   Procedure: LYMPH NODE BIOPSY;  Surgeon: Aviva Signs, MD;  Location: [facility];  Service: General;;  . PORTACATH PLACEMENT Left 02/21/2017   Procedure: INSERTION PORT-A-CATH;  Surgeon: Aviva Signs, MD;  Location: [facility];  Service: General;  Laterality: Left;  . TOTAL ABDOMINAL HYSTERECTOMY     bleeding   . TOTAL KNEE ARTHROPLASTY Right 05/12/2016   Procedure: TOTAL KNEE ARTHROPLASTY;  Surgeon: [name], MD;  Location: [facility];  Service: Orthopedics;  Laterality: Right;  . WISDOM TOOTH EXTRACTION     Family History  Problem Relation Age of Onset  . Colon cancer Father   . Cancer Mother   . Stroke Mother   . Hypertension Brother   . Dementia Sister   . Multiple sclerosis Daughter   . Pseudotumor cerebri Daughter   . Neuropathy Daughter    History  Sexual Activity  . Sexual activity: Not Currently    Outpatient Encounter Prescriptions as of 03/23/2017  Medication Sig  . acetaminophen (TYLENOL) 325 MG tablet Take 650 mg by mouth every 4 (four) hours as needed for mild pain.   Marland Kitchen acyclovir (ZOVIRAX)  400 MG tablet Take 1 tablet (400 mg total) by mouth 2 (two) times daily.  Marland Kitchen allopurinol (ZYLOPRIM) 300 MG tablet Take 1 tablet (300 mg total) by mouth daily.  Marland Kitchen aspirin EC 81 MG tablet Take 81 mg by mouth daily.  . Calcium Carb-Cholecalciferol (CALCIUM 600+D) 600-800 MG-UNIT TABS Take 1 tablet by mouth 2 (two) times daily.  . CYCLOPHOSPHAMIDE IV Inject into the vein. Every 3 weeks  . diphenhydrAMINE (BENADRYL) 50 MG tablet Take 50 mg by mouth at bedtime as needed for itching.  Marland Kitchen DOXOrubicin HCl (ADRIAMYCIN IV) Inject into the vein. Every 3 weeks  . lidocaine-prilocaine (EMLA) cream Apply to affected area once  . lisinopril-hydrochlorothiazide (PRINZIDE,ZESTORETIC) 20-12.5 MG tablet TAKE ONE TABLET BY MOUTH ONCE DAILY  . multivitamin  (THERAGRAN) per tablet Take 1 tablet by mouth daily.    . ondansetron (ZOFRAN) 8 MG tablet Take 1 tablet (8 mg total) by mouth 2 (two) times daily as needed for refractory nausea / vomiting. Start on day 3 after cyclophosphamide chemotherapy.  . pantoprazole (PROTONIX) 40 MG tablet Take 1 tablet (40 mg total) by mouth daily before breakfast.  . Pediatric Multivitamins-Iron (FLINTSTONES PLUS IRON) chewable tablet Chew 1 tablet by mouth 2 (two) times daily.  . Pegfilgrastim (NEULASTA ONPRO ) Inject into the skin. Every 3 weeks  . polyethylene glycol (MIRALAX / GLYCOLAX) packet Take 17 g by mouth daily as needed for mild constipation.  . predniSONE (DELTASONE) 20 MG tablet Take 3.5 tabs (70 mg) by mouth in the morning with breakfast on Days 1-5 of chemotherapy.  . prochlorperazine (COMPAZINE) 10 MG tablet Take 1 tablet (10 mg total) by mouth every 6 (six) hours as needed (Nausea or vomiting).  . RiTUXimab (RITUXAN IV) Inject into the vein. Every 3 weeks  . traMADol (ULTRAM) 50 MG tablet Take 1 tablet (50 mg total) by mouth every 6 (six) hours as needed for moderate pain.  . vinCRIStine 2 mg in sodium chloride 0.9 % 50 mL Inject into the vein once. Every 3 weeks  . [DISCONTINUED] magnesium hydroxide (MILK OF MAGNESIA) 400 MG/5ML suspension Take 30 mLs by mouth every other day.   No facility-administered encounter medications on file as of 03/23/2017.     Activities of Daily Living In your present state of health, do you have any difficulty performing the following activities: 03/23/2017 02/16/2017  Hearing? N N  Vision? N N  Difficulty concentrating or making decisions? Y N  Walking or climbing stairs? Y Y  Dressing or bathing? N N  Doing errands, shopping? Y N  Preparing Food and eating ? N -  Using the Toilet? N -  In the past six months, have you accidently leaked urine? Y -  Do you have problems with loss of bowel control? N -  Managing your Medications? Y -  Managing your Finances? Y -    Housekeeping or managing your Housekeeping? Y -  Some recent data might be hidden    Patient Care Team: Fayrene Helper, MD as PCP - General Rutherford Guys, MD as Attending Physician (Ophthalmology) Burnell Blanks, MD as Attending Physician (Cardiology) Fran Lowes, MD as Consulting Physician (Nephrology) Rogene Houston, MD as Consulting Physician (Gastroenterology)    Assessment:    Exercise Activities and Dietary recommendations Current Exercise Habits: The patient does not participate in regular exercise at present, Exercise limited by: Other - see comments (B-cell Lymphoma)  Goals    . Exercise 3x per week (30 min per time)  Recommend starting a routine exercise program at least 3 days a week for 30-45 minutes at a time as tolerated.        Fall Risk Fall Risk  03/23/2017 11/08/2016 03/18/2016 11/25/2015 06/11/2015  Falls in the past year? Yes Yes Yes No Yes  Number falls in past yr: 2 or more 2 or more 2 or more - 1  Injury with Fall? No No No - No  Risk Factor Category  High Fall Risk High Fall Risk High Fall Risk - -  Risk for fall due to : History of fall(s);Impaired balance/gait;Impaired mobility - - - Impaired balance/gait  Follow up Education provided;Falls prevention discussed - - - -   Depression Screen PHQ 2/9 Scores 03/23/2017 11/08/2016 06/11/2015 11/21/2013  PHQ - 2 Score 1 5 0 0  PHQ- 9 Score - 14 - -     Cognitive Function MMSE - Mini Mental State Exam 03/23/2017  Orientation to time 5  Orientation to Place 5  Registration 3  Attention/ Calculation 5  Recall 1  Language- name 2 objects 2  Language- repeat 1  Language- follow 3 step command 3  Language- read & follow direction 1  Write a sentence 1  Copy design 0  Total score 27        Immunization History  Administered Date(s) Administered  . Influenza Split 07/29/2011, 08/16/2012  . Influenza Whole 08/15/2006, 08/07/2007, 07/08/2008, 09/03/2009, 07/02/2010  .  Influenza,inj,Quad PF,36+ Mos 07/10/2013, 09/24/2014, 08/12/2015, 11/08/2016  . PPD Test 05/14/2016  . Pneumococcal Conjugate-13 05/03/2014  . Pneumococcal Polysaccharide-23 04/25/2007  . Td 09/03/2009  . Zoster 10/23/2009   Screening Tests Health Maintenance  Topic Date Due  . OPHTHALMOLOGY EXAM  11/06/2015  . HEMOGLOBIN A1C  05/08/2017  . INFLUENZA VACCINE  05/18/2017  . FOOT EXAM  11/08/2017  . TETANUS/TDAP  09/04/2019  . DEXA SCAN  Completed  . PNA vac Low Risk Adult  Completed      Plan:   I have personally reviewed and noted the following in the patient's chart:   . Medical and social history . Use of alcohol, tobacco or illicit drugs  . Current medications and supplements . Functional ability and status . Nutritional status . Physical activity . Advanced directives . List of other physicians . Hospitalizations, surgeries, and ER visits in previous 12 months . Vitals . Screenings to include cognitive, depression, and falls . Referrals and appointments  In addition, I have reviewed and discussed with patient certain preventive protocols, quality metrics, and best practice recommendations. A written personalized care plan for preventive services as well as general preventive health recommendations were provided to patient.     Stormy Fabian, LPN  10/18/6577

## 2017-03-28 ENCOUNTER — Encounter (HOSPITAL_COMMUNITY): Payer: Self-pay

## 2017-03-29 ENCOUNTER — Telehealth: Payer: Self-pay | Admitting: Family Medicine

## 2017-03-29 NOTE — Telephone Encounter (Signed)
Would like to discuss patient's mental state and her behaviors  Such as not wanting to get out of bed or eat.  She is physically capable of moving around, going to the bathroom, and eating  BUT he states that she is mentally choosing not to (or is not mentally capable of)  and stays in her wheelchair and ultimately ends up "using the bathroom on herself".  He would like to discuss this behavior with you.

## 2017-03-29 NOTE — Telephone Encounter (Signed)
I returned the call, explained no new medication will be started before assessment in office. Mother probably acting out her fear, anger etc at her new dx of cancer.Advised pET showed no cancer activity o in brain and he said she did well on recent MMSE , so she had mood swings and showed rage ae times to her sister which were concerning and disruptive. Recommnded seeing if he could get her on medicaid for increased assistance Spoke briefly about access to SNF if the need arose

## 2017-03-31 ENCOUNTER — Encounter: Payer: Self-pay | Admitting: Cardiovascular Disease

## 2017-04-12 ENCOUNTER — Encounter (HOSPITAL_BASED_OUTPATIENT_CLINIC_OR_DEPARTMENT_OTHER): Payer: Medicare Other | Admitting: Oncology

## 2017-04-12 ENCOUNTER — Encounter (HOSPITAL_COMMUNITY): Payer: Self-pay

## 2017-04-12 ENCOUNTER — Ambulatory Visit (HOSPITAL_COMMUNITY): Payer: Medicare Other

## 2017-04-12 ENCOUNTER — Encounter (HOSPITAL_BASED_OUTPATIENT_CLINIC_OR_DEPARTMENT_OTHER): Payer: Medicare Other

## 2017-04-12 VITALS — BP 103/47 | HR 52 | Temp 98.6°F | Resp 20 | Wt 133.3 lb

## 2017-04-12 DIAGNOSIS — Z5111 Encounter for antineoplastic chemotherapy: Secondary | ICD-10-CM

## 2017-04-12 DIAGNOSIS — Z5112 Encounter for antineoplastic immunotherapy: Secondary | ICD-10-CM | POA: Diagnosis not present

## 2017-04-12 DIAGNOSIS — C8330 Diffuse large B-cell lymphoma, unspecified site: Secondary | ICD-10-CM

## 2017-04-12 DIAGNOSIS — D500 Iron deficiency anemia secondary to blood loss (chronic): Secondary | ICD-10-CM | POA: Diagnosis not present

## 2017-04-12 LAB — COMPREHENSIVE METABOLIC PANEL
ALBUMIN: 3.1 g/dL — AB (ref 3.5–5.0)
ALK PHOS: 56 U/L (ref 38–126)
ALT: 11 U/L — AB (ref 14–54)
AST: 20 U/L (ref 15–41)
Anion gap: 7 (ref 5–15)
BILIRUBIN TOTAL: 0.4 mg/dL (ref 0.3–1.2)
BUN: 25 mg/dL — AB (ref 6–20)
CALCIUM: 8.9 mg/dL (ref 8.9–10.3)
CO2: 29 mmol/L (ref 22–32)
CREATININE: 1.03 mg/dL — AB (ref 0.44–1.00)
Chloride: 104 mmol/L (ref 101–111)
GFR calc Af Amer: 58 mL/min — ABNORMAL LOW (ref >60)
GFR calc non Af Amer: 50 mL/min — ABNORMAL LOW (ref >60)
GLUCOSE: 102 mg/dL — AB (ref 65–99)
Potassium: 3.7 mmol/L (ref 3.5–5.1)
SODIUM: 140 mmol/L (ref 135–145)
TOTAL PROTEIN: 5.8 g/dL — AB (ref 6.5–8.1)

## 2017-04-12 LAB — CBC WITH DIFFERENTIAL/PLATELET
BASOS PCT: 1 %
Basophils Absolute: 0 10*3/uL (ref 0.0–0.1)
EOS ABS: 0 10*3/uL (ref 0.0–0.7)
Eosinophils Relative: 1 %
HEMATOCRIT: 27.2 % — AB (ref 36.0–46.0)
Hemoglobin: 8.7 g/dL — ABNORMAL LOW (ref 12.0–15.0)
LYMPHS ABS: 0.7 10*3/uL (ref 0.7–4.0)
Lymphocytes Relative: 16 %
MCH: 31.3 pg (ref 26.0–34.0)
MCHC: 32 g/dL (ref 30.0–36.0)
MCV: 97.8 fL (ref 78.0–100.0)
MONOS PCT: 18 %
Monocytes Absolute: 0.8 10*3/uL (ref 0.1–1.0)
NEUTROS ABS: 3 10*3/uL (ref 1.7–7.7)
NEUTROS PCT: 64 %
Platelets: 379 10*3/uL (ref 150–400)
RBC: 2.78 MIL/uL — AB (ref 3.87–5.11)
RDW: 19.9 % — ABNORMAL HIGH (ref 11.5–15.5)
WBC: 4.6 10*3/uL (ref 4.0–10.5)

## 2017-04-12 LAB — PHOSPHORUS: Phosphorus: 3.2 mg/dL (ref 2.5–4.6)

## 2017-04-12 LAB — URIC ACID: Uric Acid, Serum: 2.5 mg/dL (ref 2.3–6.6)

## 2017-04-12 LAB — LACTATE DEHYDROGENASE: LDH: 168 U/L (ref 98–192)

## 2017-04-12 MED ORDER — DOXORUBICIN HCL CHEMO IV INJECTION 2 MG/ML
50.0000 mg/m2 | Freq: Once | INTRAVENOUS | Status: AC
  Administered 2017-04-12: 84 mg via INTRAVENOUS
  Filled 2017-04-12: qty 42

## 2017-04-12 MED ORDER — SODIUM CHLORIDE 0.9 % IV SOLN: 10.0000 mg | Freq: Once | INTRAVENOUS | Status: DC

## 2017-04-12 MED ORDER — SODIUM CHLORIDE 0.9 % IV SOLN
750.0000 mg/m2 | Freq: Once | INTRAVENOUS | Status: AC
  Administered 2017-04-12: 1260 mg via INTRAVENOUS
  Filled 2017-04-12: qty 50

## 2017-04-12 MED ORDER — HEPARIN SOD (PORK) LOCK FLUSH 100 UNIT/ML IV SOLN
500.0000 [IU] | Freq: Once | INTRAVENOUS | Status: AC | PRN
  Administered 2017-04-12: 500 [IU]
  Filled 2017-04-12: qty 5

## 2017-04-12 MED ORDER — PEGFILGRASTIM 6 MG/0.6ML ~~LOC~~ PSKT
6.0000 mg | PREFILLED_SYRINGE | Freq: Once | SUBCUTANEOUS | Status: AC
  Administered 2017-04-12: 6 mg via SUBCUTANEOUS
  Filled 2017-04-12: qty 0.6

## 2017-04-12 MED ORDER — SODIUM CHLORIDE 0.9 % IV SOLN
Freq: Once | INTRAVENOUS | Status: AC
  Administered 2017-04-12: 11:00:00 via INTRAVENOUS

## 2017-04-12 MED ORDER — SODIUM CHLORIDE 0.9% FLUSH
10.0000 mL | INTRAVENOUS | Status: DC | PRN
  Administered 2017-04-12: 10 mL
  Filled 2017-04-12: qty 10

## 2017-04-12 MED ORDER — PALONOSETRON HCL INJECTION 0.25 MG/5ML
0.2500 mg | Freq: Once | INTRAVENOUS | Status: AC
  Administered 2017-04-12: 0.25 mg via INTRAVENOUS
  Filled 2017-04-12: qty 5

## 2017-04-12 MED ORDER — SODIUM CHLORIDE 0.9 % IV SOLN
375.0000 mg/m2 | Freq: Once | INTRAVENOUS | Status: AC
  Administered 2017-04-12: 600 mg via INTRAVENOUS
  Filled 2017-04-12: qty 50

## 2017-04-12 MED ORDER — VINCRISTINE SULFATE CHEMO INJECTION 1 MG/ML
1.0000 mg | Freq: Once | INTRAVENOUS | Status: AC
  Administered 2017-04-12: 1 mg via INTRAVENOUS
  Filled 2017-04-12: qty 1

## 2017-04-12 MED ORDER — DIPHENHYDRAMINE HCL 25 MG PO CAPS
50.0000 mg | ORAL_CAPSULE | Freq: Once | ORAL | Status: AC
  Administered 2017-04-12: 50 mg via ORAL
  Filled 2017-04-12: qty 2

## 2017-04-12 MED ORDER — ACETAMINOPHEN 325 MG PO TABS
650.0000 mg | ORAL_TABLET | Freq: Once | ORAL | Status: AC
  Administered 2017-04-12: 650 mg via ORAL
  Filled 2017-04-12: qty 2

## 2017-04-12 MED ORDER — DEXAMETHASONE SODIUM PHOSPHATE 10 MG/ML IJ SOLN
10.0000 mg | Freq: Once | INTRAMUSCULAR | Status: AC
  Administered 2017-04-12: 10 mg via INTRAVENOUS
  Filled 2017-04-12: qty 1

## 2017-04-12 NOTE — Progress Notes (Signed)
[name], [name] [id number]   CLINIC:  Medical Oncology/Hematology  PCP:  Fayrene Helper, MD [address], [address] [place] [address] [phone]   REASON FOR VISIT:  Follow-up for Diffuse Large B-cell Lymphoma (DLBCL):   CURRENT THERAPY: R-CHOP  BRIEF ONCOLOGIC HISTORY:    Diffuse large B cell lymphoma ([name])   12/20/2016 Imaging    CT abd/pelvis: IMPRESSION: 1. Extensive abdominal, pelvic and retrocrural lymphadenopathy, most pronounced in the retroperitoneum, as detailed above. Findings are highly suspicious for lymphoproliferative disorder and further evaluation is strongly recommended at this time. 2. Colonic diverticulosis without evidence of acute diverticulitis at this time. 3. Aortic atherosclerosis. 4. Additional incidental findings, as above.      12/30/2016 PET scan    IMPRESSION: 1. Extensive hypermetabolic abdominal and pelvic adenopathy, including bulky retroperitoneal adenopathy lifting of the abdominal aorta away from the spine. One of the larger lymph nodes measures 8.4 cm in short axis with maximum SUV 25.0. 2. Small pleural focus of tumor in the left posteromedial chest, maximum SUV only 4.2. This represents the only thoracic involvement. 3. Other imaging findings of potential clinical significance: Atherosclerotic aortic arch. Mild cardiomegaly. Mild atelectasis in both lower lobes. Hyperdense left renal cysts as on prior CT. Mild left hydronephrosis likely due to extrinsic mass effect on the left ureter. Right greater than left degenerative arthropathy of the hips.      01/31/2017 Surgery    Exploratory lap Arnoldo Farmer)       01/31/2017 Pathology Results    Lymph node biospy: DLBCL, high-grade, monoclonal kappa restricted.       02/07/2017 Initial Diagnosis    Diffuse large B cell lymphoma ([name])     02/14/2017 Echocardiogram    Pre-chemo ECHO: EF 55%.       03/01/2017 Bone Marrow Biopsy   No bone marrow involvement by lymphoma. [facility] cytogenetics      03/01/2017 -  Chemotherapy    The patient had DOXOrubicin (ADRIAMYCIN) chemo injection 90 mg, 50 mg/m2 = 90 mg, Intravenous,  Once, 1 of 6 cycles  palonosetron (ALOXI) injection 0.25 mg, 0.25 mg, Intravenous,  Once, 1 of 6 cycles  pegfilgrastim (NEULASTA ONPRO KIT) injection 6 mg, 6 mg, Subcutaneous, Once, 1 of 6 cycles  vinCRIStine (ONCOVIN) 2 mg in sodium chloride 0.9 % 50 mL chemo infusion, 2 mg, Intravenous,  Once, 1 of 6 cycles  riTUXimab (RITUXAN) 700 mg in sodium chloride 0.9 % 250 mL (2.1875 mg/mL) chemo infusion, 375 mg/m2 = 700 mg, Intravenous,  Once, 1 of 6 cycles Dose modification: 375 mg/m2 (original dose 375 mg/m2, Cycle 1)  cyclophosphamide (CYTOXAN) 1,340 mg in sodium chloride 0.9 % 250 mL chemo infusion, 750 mg/m2 = 1,340 mg, Intravenous,  Once, 1 of 6 cycles  for chemotherapy treatment.          INTERVAL HISTORY:  [name] 83 y.o. female returns for follow-up and to review results of recent imaging and pathology.   Patient presents today for cycle 3 of R-CHOP. She states she's noted worsening neuropathy in her hands, worse in the left compared to her right. She states she's been dropping her pencils more when she does her crossword puzzles. She also states she has constipation which is worse, however it always resolves with miralax. Otherwise she has no complaints. Denies any fatigue or worsening appetite. No CP, SOB, abdominal pain.  REVIEW OF SYSTEMS:  Review of Systems  Constitutional: Negative for appetite change, diaphoresis,  fatigue, fever and unexpected weight change.  HENT:  Negative.   Eyes: Negative.   Respiratory: Negative.  Negative for shortness of breath.   Cardiovascular: Positive for leg swelling (chronic in left leg).  Gastrointestinal: Positive for constipation (resolves with laxatives). Negative for abdominal pain.  Endocrine: Negative.  Negative for hot flashes.    Genitourinary: Negative.    Musculoskeletal: Negative.   Neurological:       Neuropathy in fingertips, worse in left hand than right hand  Hematological: Negative.   Psychiatric/Behavioral: Negative.      PAST MEDICAL/SURGICAL HISTORY:  Past Medical History:  Diagnosis Date  . Arthritis of knee    bilateral    . Chronic renal insufficiency   . Diabetes mellitus, type 2 ([name])   . GERD (gastroesophageal reflux disease)   . History of kidney stones   . Hypertension   . Iron deficiency anemia 12/28/2016  . Lymphadenopathy, abdominal 12/28/2016  . Osteopenia   . PAD (peripheral artery disease) ([name])   . Personal history of colonic polyps 3 years ago    colonoscopy    Past Surgical History:  Procedure Laterality Date  . BIOPSY  12/16/2016   Procedure: BIOPSY;  Surgeon: Rogene Houston, MD;  [facility];  Service: Endoscopy;;  gastric polyp  . bunionectomy bilateral  feet    . COLONOSCOPY N/A 12/16/2016   Procedure: COLONOSCOPY;  Surgeon: Rogene Houston, MD;  [facility];  Service: Endoscopy;  Laterality: N/A;  2:15  . DENTAL SURGERY    . ESOPHAGOGASTRODUODENOSCOPY N/A 12/16/2016   Procedure: ESOPHAGOGASTRODUODENOSCOPY (EGD);  Surgeon: Rogene Houston, MD;  [facility];  Service: Endoscopy;  Laterality: N/A;  . gum disease  2010   treated by Dr. Andree Elk   . LAPAROTOMY N/A 01/31/2017   Procedure: EXPLORATORY LAPAROTOMY;  Surgeon: Aviva Signs, MD;  [facility];  Service: General;  Laterality: N/A;  . Left knee manipulation  under anasthesia due to contracture -  03/08/08   Dr. Aline Brochure   . left knee replacement  08/14/08   Dr. Aline Brochure   . LYMPH NODE BIOPSY  01/31/2017   Procedure: LYMPH NODE BIOPSY;  Surgeon: Aviva Signs, MD;  [facility];  Service: General;;  . PORTACATH PLACEMENT Left 02/21/2017   Procedure: INSERTION PORT-A-CATH;  Surgeon: Aviva Signs, MD;  [facility];  Service: General;  Laterality: Left;  . TOTAL ABDOMINAL  HYSTERECTOMY     bleeding   . TOTAL KNEE ARTHROPLASTY Right 05/12/2016   Procedure: TOTAL KNEE ARTHROPLASTY;  Surgeon: [name], MD;  Location: [facility];  Service: Orthopedics;  Laterality: Right;  . WISDOM TOOTH EXTRACTION       SOCIAL HISTORY:  Social History   Social History  . Marital status: Widowed    Spouse name: N/A  . Number of children: 3  . Years of education: college    Occupational History  . retired Chemical engineer  Retired   Social History Main Topics  . Smoking status: Former Smoker    Packs/day: 0.25    Years: 30.00    Types: Cigarettes    Quit date: 10/19/1999  . Smokeless tobacco: Never Used  . Alcohol use No  . Drug use: No  . Sexual activity: Not Currently   Other Topics Concern  . Not on file   Social History Narrative  . No narrative on file    FAMILY HISTORY:  Family History  Problem Relation Age of Onset  . Colon  cancer Father   . Cancer Mother   . Stroke Mother   . Hypertension Brother   . Dementia Sister   . Multiple sclerosis Daughter   . Pseudotumor cerebri Daughter   . Neuropathy Daughter     CURRENT MEDICATIONS:  Outpatient Encounter Prescriptions as of 02/08/2017  Medication Sig Note  . acetaminophen (TYLENOL) 325 MG tablet Take 650 mg by mouth every 4 (four) hours as needed for mild pain.    Marland Kitchen aspirin EC 81 MG tablet Take 81 mg by mouth daily. 01/24/2017: On hold due to upcoming procedure.  . Calcium Carb-Cholecalciferol (CALCIUM 600+D) 600-800 MG-UNIT TABS Take 1 tablet by mouth 2 (two) times daily.   . diphenhydrAMINE (BENADRYL) 50 MG tablet Take 50 mg by mouth at bedtime as needed for itching.   Marland Kitchen lisinopril-hydrochlorothiazide (PRINZIDE,ZESTORETIC) 20-12.5 MG tablet TAKE ONE TABLET BY MOUTH ONCE DAILY   . magnesium hydroxide (MILK OF MAGNESIA) 400 MG/5ML suspension Take 30 mLs by mouth every other day.   . multivitamin (THERAGRAN) per tablet Take 1 tablet by mouth daily.   01/24/2017: Time varies  . pantoprazole (PROTONIX)  40 MG tablet Take 1 tablet (40 mg total) by mouth daily before breakfast.   . Pediatric Multivitamins-Iron (FLINTSTONES PLUS IRON) chewable tablet Chew 1 tablet by mouth 2 (two) times daily.   . polyethylene glycol (MIRALAX / GLYCOLAX) packet Take 17 g by mouth daily as needed (constipation).  12/14/2016: Hasn't started yet.  . traMADol (ULTRAM) 50 MG tablet Take 1 tablet (50 mg total) by mouth every 6 (six) hours as needed for moderate pain.    No facility-administered encounter medications on file as of 02/08/2017.     ALLERGIES:  Allergies  Allergen Reactions  . Codeine Hives  . Fosamax [Alendronate Sodium] Other (See Comments)    Pt reports excessive urination on the day she took the pill     PHYSICAL EXAM:  ECOG Performance status: 2 - Symptomatic; requires occasional assistance    Physical Exam  Constitutional: She is oriented to person, place, and time.  HENT:  Head: Normocephalic and atraumatic.  Mouth/Throat: Oropharynx is clear and moist.  Eyes: Conjunctivae are normal. Pupils are equal, round, and reactive to light.  Neck: Normal range of motion. Neck supple.  Cardiovascular: Normal rate and regular rhythm.  Exam reveals no gallop and no friction rub.   No murmur heard. Pulmonary/Chest: Effort normal. No respiratory distress. She has no wheezes. She has no rales. She exhibits no tenderness.  Abdominal: Soft. Bowel sounds are normal. She exhibits no distension. There is no tenderness. There is no rebound.  Musculoskeletal: Normal range of motion.  1+ bilateral ankle edema   Lymphadenopathy:    She has no axillary adenopathy.       Right: No supraclavicular adenopathy present.       Left: No supraclavicular adenopathy present.  Neurological: She is alert and oriented to person, place, and time.  Skin: Skin is warm and dry.  Psychiatric: Mood, memory, affect and judgment normal.  Nursing note and vitals reviewed.    LABORATORY DATA:  I have reviewed the labs as  listed.  CBC    Component Value Date/Time   WBC 4.6 04/12/2017 0931   RBC 2.78 (L) 04/12/2017 0931   HGB 8.7 (L) 04/12/2017 0931   HCT 27.2 (L) 04/12/2017 0931   PLT 379 04/12/2017 0931   MCV 97.8 04/12/2017 0931   MCH 31.3 04/12/2017 0931   MCHC 32.0 04/12/2017 0931   RDW 19.9 (H)  04/12/2017 0931   LYMPHSABS 0.7 04/12/2017 0931   MONOABS 0.8 04/12/2017 0931   EOSABS 0.0 04/12/2017 0931   BASOSABS 0.0 04/12/2017 0931   CMP Latest Ref Rng & Units 04/12/2017 03/22/2017 03/11/2017  Glucose 65 - 99 mg/dL 102(H) 150(H) 118(H)  BUN 6 - 20 mg/dL 25(H) 34(H) 25(H)  Creatinine 0.44 - 1.00 mg/dL 1.03(H) 1.11(H) 0.91  Sodium 135 - 145 mmol/L 140 140 139  Potassium 3.5 - 5.1 mmol/L 3.7 3.8 3.4(L)  Chloride 101 - 111 mmol/L 104 103 101  CO2 22 - 32 mmol/L _0 Calcium 8.9 - 10.3 mg/dL 8.9 9.1 9.3  Total Protein 6.5 - 8.1 g/dL 5.8(L) 6.3(L) 6.5  Total Bilirubin 0.3 - 1.2 mg/dL 0.4 0.6 0.6  Alkaline Phos 38 - 126 U/L 56 48 58  AST 15 - 41 U/L _1 ALT 14 - 54 U/L 11(L) 12(L) 13(L)    PENDING LABS:    DIAGNOSTIC IMAGING:  PET scan: 12/30/16       PATHOLOGY:  Lymph node biopsy: 01/31/17    Cytology: 01/31/17    ASSESSMENT & PLAN:  Stage IV Diffuse Large B-cell Lymphoma (DLBCL) with small pleural tumor focus on the posteromedial aspect of left chest; no bone marrow involvement. IPI score of 3. She is in the high to intermediate risk group. Predicted 4 year progression free survival rate is 43%.  PLAN: Proceed with cycle 3 of R-CHOP today. Labs reviewed. Decreased her vincristine to 41m from 24mfor this cycle and the next 3 cycles, due to worsening neuropathy.  Will schedule patient for a restaging PET-CT prior to her next visit to assess response. RTC in 3 weeks for chemo and follow up.  All questions were answered to patient's stated satisfaction. Encouraged patient to call with any new concerns or questions before her next visit to the cancer center and we can  certain see her sooner, if needed.      This note was electronically signed by:  LoTwana FirstMD 04/12/17

## 2017-04-12 NOTE — Progress Notes (Signed)
Patient tolerated infusion today without incidence. Patient given schedule for follow up. Patient discharged via wheelchair and in stable condition with daughter.

## 2017-04-12 NOTE — Patient Instructions (Addendum)
[facility] Discharge Instructions for Patients Receiving Chemotherapy   Beginning January 23rd 2017 lab work for the [facility] will be done in the  Main lab at [facility] on 1st floor. If you have a lab appointment with the [facility] please come in thru the  Main Entrance and check in at the main information desk   Today you received the following chemotherapy agents: RCHOP, Neulasta On-Pro  The On Pro will begin to inject medication at 6pm on Wednesday 6/27. You can safely remove it after 7pm or when the green light is no longer flashing and the medication chamber is empty.    To help prevent nausea and vomiting after your treatment, we encourage you to take your nausea medication as prescribed.    If you develop nausea and vomiting, or diarrhea that is not controlled by your medication, call the clinic.  Please review the constipation sheet that was provided. You need to be having a bowel movement at least every other day but no more than 2 days in between bowel movements.    The clinic phone number is [phone]. Office hours are Monday-Friday 8:30am-5:00pm.  BELOW ARE SYMPTOMS THAT SHOULD BE REPORTED IMMEDIATELY:  *FEVER GREATER THAN 101.0 F  *CHILLS WITH OR WITHOUT FEVER  NAUSEA AND VOMITING THAT IS NOT CONTROLLED WITH YOUR NAUSEA MEDICATION  *UNUSUAL SHORTNESS OF BREATH  *UNUSUAL BRUISING OR BLEEDING  TENDERNESS IN MOUTH AND THROAT WITH OR WITHOUT PRESENCE OF ULCERS  *URINARY PROBLEMS  *BOWEL PROBLEMS  UNUSUAL RASH Items with * indicate a potential emergency and should be followed up as soon as possible. If you have an emergency after office hours please contact your primary care physician or go to the nearest emergency department.  Please call the clinic during office hours if you have any questions or concerns.   You may also contact the Patient Navigator at [phone] should you have any questions or need assistance in obtaining  follow up care.      Resources For Cancer Patients and their Caregivers ? American Cancer Society: Can assist with transportation, wigs, general needs, runs Look Good Feel Better.        (514)388-3872 ? Cancer Care: Provides financial assistance, online support groups, medication/co-pay assistance.  1-800-813-HOPE (857)784-4024) ? [place] Assists [place] cancer patients and their families through emotional , educational and financial support.  616-422-8031 ? [place] DSS Where to apply for food stamps, Medicaid and utility assistance. 6510636463 ? RCATS: Transportation to medical appointments. (780)173-8490 ? Social Security Administration: May apply for disability if have a Stage IV cancer. [phone] (629)325-4109 ? [employer], Disability and Transit Services: Assists with nutrition, care and transit needs. [phone]

## 2017-04-12 NOTE — Progress Notes (Signed)
Patient complained of dropping things at home and not understanding why she was dropping them. She complained of increased neuropathy in hands. Dr. Talbert Cage notified and she saw patient. Pt's Vincristine decreased to 1mg .

## 2017-04-14 NOTE — Progress Notes (Deleted)
No chief complaint on file.    History of Present Illness: 83 yo AAF with history of PAD, HTN, DM and OA who is here today for PV follow up. I saw her as a new patient in 2011 for PV evaluation. She underwent segmental pressure study in May 2011 which suggested left lower extremity occlusive disease. ABI was normal on the right and reduced mildly at 0.77 on the left. She had no claudication, rest pain or ulcerations. She has chronic edema in the left leg with signs of venous stasis. Venous doppler December 2016 without evidence of left leg DVT. She had her left knee replaced in 2011. Last lower extremity doppler studies February 2018 with normal ABI.      She is here today for follow up. The patient denies any chest pain, dyspnea, palpitations, lower extremity edema, orthopnea, PND, dizziness, near syncope or syncope.   Primary Care Physician: Fayrene Helper, MD  Past Medical History:  Diagnosis Date  . Arthritis of knee    bilateral    . Chronic renal insufficiency   . Diabetes mellitus, type 2 ([place])   . GERD (gastroesophageal reflux disease)   . History of kidney stones   . Hypertension   . Iron deficiency anemia 12/28/2016  . Lymphadenopathy, abdominal 12/28/2016  . Osteopenia   . PAD (peripheral artery disease) ([place])   . Personal history of colonic polyps 3 years ago    colonoscopy     Past Surgical History:  Procedure Laterality Date  . BIOPSY  12/16/2016   Procedure: BIOPSY;  Surgeon: Rogene Houston, MD;  [facility];  Service: Endoscopy;;  gastric polyp  . bunionectomy bilateral  feet    . COLONOSCOPY N/A 12/16/2016   Procedure: COLONOSCOPY;  Surgeon: Rogene Houston, MD;  [facility];  Service: Endoscopy;  Laterality: N/A;  2:15  . DENTAL SURGERY    . ESOPHAGOGASTRODUODENOSCOPY N/A 12/16/2016   Procedure: ESOPHAGOGASTRODUODENOSCOPY (EGD);  Surgeon: Rogene Houston, MD;  [facility];  Service: Endoscopy;  Laterality: N/A;  . gum disease   2010   treated by Dr. Andree Elk   . LAPAROTOMY N/A 01/31/2017   Procedure: EXPLORATORY LAPAROTOMY;  Surgeon: Aviva Signs, MD;  Location: [facility];  Service: General;  Laterality: N/A;  . Left knee manipulation  under anasthesia due to contracture -  03/08/08   Dr. Aline Brochure   . left knee replacement  08/14/08   Dr. Aline Brochure   . LYMPH NODE BIOPSY  01/31/2017   Procedure: LYMPH NODE BIOPSY;  Surgeon: Aviva Signs, MD;  Location: [facility];  Service: General;;  . PORTACATH PLACEMENT Left 02/21/2017   Procedure: INSERTION PORT-A-CATH;  Surgeon: Aviva Signs, MD;  Location: [facility];  Service: General;  Laterality: Left;  . TOTAL ABDOMINAL HYSTERECTOMY     bleeding   . TOTAL KNEE ARTHROPLASTY Right 05/12/2016   Procedure: TOTAL KNEE ARTHROPLASTY;  Surgeon: [name], MD;  Location: [facility];  Service: Orthopedics;  Laterality: Right;  . WISDOM TOOTH EXTRACTION      Current Outpatient Prescriptions  Medication Sig Dispense Refill  . acetaminophen (TYLENOL) 325 MG tablet Take 650 mg by mouth every 4 (four) hours as needed for mild pain.     Marland Kitchen acyclovir (ZOVIRAX) 400 MG tablet Take 1 tablet (400 mg total) by mouth 2 (two) times daily. 60 tablet 2  . allopurinol (ZYLOPRIM) 300 MG tablet Take 1 tablet (300 mg total) by mouth daily. 30 tablet 3  .  aspirin EC 81 MG tablet Take 81 mg by mouth daily.    . Calcium Carb-Cholecalciferol (CALCIUM 600+D) 600-800 MG-UNIT TABS Take 1 tablet by mouth 2 (two) times daily.    . CYCLOPHOSPHAMIDE IV Inject into the vein. Every 3 weeks    . diphenhydrAMINE (BENADRYL) 50 MG tablet Take 50 mg by mouth at bedtime as needed for itching.    Marland Kitchen DOXOrubicin HCl (ADRIAMYCIN IV) Inject into the vein. Every 3 weeks    . lidocaine-prilocaine (EMLA) cream Apply to affected area once (Patient not taking: Reported on 04/12/2017) 30 g 3  . lisinopril-hydrochlorothiazide (PRINZIDE,ZESTORETIC) 20-12.5 MG tablet TAKE ONE TABLET BY MOUTH ONCE DAILY 90 tablet 1  . multivitamin (THERAGRAN) per  tablet Take 1 tablet by mouth daily.      . ondansetron (ZOFRAN) 8 MG tablet Take 1 tablet (8 mg total) by mouth 2 (two) times daily as needed for refractory nausea / vomiting. Start on day 3 after cyclophosphamide chemotherapy. (Patient not taking: Reported on 04/12/2017) 30 tablet 1  . pantoprazole (PROTONIX) 40 MG tablet Take 1 tablet (40 mg total) by mouth daily before breakfast. 30 tablet 5  . Pediatric Multivitamins-Iron (FLINTSTONES PLUS IRON) chewable tablet Chew 1 tablet by mouth 2 (two) times daily.    . Pegfilgrastim (NEULASTA ONPRO [place]) Inject into the skin. Every 3 weeks    . polyethylene glycol (MIRALAX / GLYCOLAX) packet Take 17 g by mouth daily as needed for mild constipation.    . predniSONE (DELTASONE) 20 MG tablet Take 3.5 tabs (70 mg) by mouth in the morning with breakfast on Days 1-5 of chemotherapy. (Patient not taking: Reported on 04/12/2017) 40 tablet 6  . prochlorperazine (COMPAZINE) 10 MG tablet Take 1 tablet (10 mg total) by mouth every 6 (six) hours as needed (Nausea or vomiting). (Patient not taking: Reported on 04/12/2017) 30 tablet 6  . RiTUXimab (RITUXAN IV) Inject into the vein. Every 3 weeks    . traMADol (ULTRAM) 50 MG tablet Take 1 tablet (50 mg total) by mouth every 6 (six) hours as needed for moderate pain. 30 tablet 0  . vinCRIStine 2 mg in sodium chloride 0.9 % 50 mL Inject into the vein once. Every 3 weeks     No current facility-administered medications for this visit.     Allergies  Allergen Reactions  . Codeine Hives  . Fosamax [Alendronate Sodium] Other (See Comments)    Pt reports excessive urination on the day she took the pill    Social History   Social History  . Marital status: Widowed    Spouse name: N/A  . Number of children: 3  . Years of education: college    Occupational History  . retired Chemical engineer  Retired   Social History Main Topics  . Smoking status: Former Smoker    Packs/day: 0.25    Years: 30.00    Types: Cigarettes     Quit date: 10/19/1999  . Smokeless tobacco: Never Used  . Alcohol use No  . Drug use: No  . Sexual activity: Not Currently   Other Topics Concern  . Not on file   Social History Narrative  . No narrative on file    Family History  Problem Relation Age of Onset  . Colon cancer Father   . Cancer Mother   . Stroke Mother   . Hypertension Brother   . Dementia Sister   . Multiple sclerosis Daughter   . Pseudotumor cerebri Daughter   . Neuropathy Daughter  Review of Systems:  As stated in the HPI and otherwise negative.   There were no vitals taken for this visit.  Physical Examination: General: Well developed, well nourished, NAD  HEENT: OP clear, mucus membranes moist  SKIN: warm, dry. No rashes. Neuro: No focal deficits  Musculoskeletal: Muscle strength 5/5 all ext  Psychiatric: Mood and affect normal  Neck: No JVD, no carotid bruits, no thyromegaly, no lymphadenopathy.  Lungs:Clear bilaterally, no wheezes, rhonci, crackles Cardiovascular: Regular rate and rhythm. No murmurs, gallops or rubs. Abdomen:Soft. Bowel sounds present. Non-tender.  Extremities: No lower extremity edema. Pulses are 2 + in the bilateral DP/PT.   EKG:  EKG is *** ordered today. The ekg ordered today demonstrates   Recent Labs: 02/01/2017: Magnesium 2.2 04/12/2017: ALT 11; BUN 25; Creatinine, Ser 1.03; Hemoglobin 8.7; Platelets 379; Potassium 3.7; Sodium 140   Lipid Panel    Component Value Date/Time   CHOL 161 11/08/2016 0912   TRIG 50 11/08/2016 0912   HDL 66 11/08/2016 0912   CHOLHDL 2.4 11/08/2016 0912   VLDL 10 11/08/2016 0912   LDLCALC 85 11/08/2016 0912     Wt Readings from Last 3 Encounters:  04/12/17 133 lb 4.8 oz (60.5 kg)  03/23/17 143 lb 1.9 oz (64.9 kg)  03/22/17 133 lb 4.8 oz (60.5 kg)     Other studies Reviewed: Additional studies/ records that were reviewed today include: . Review of the above records demonstrates:    Assessment and Plan:   1. PAD: No  claudication. ABI normal February 2018.    2. LE edema, left: Stable. No DVT by venous dopplers in 2016.   3. PACs: No palpitations.    Current medicines are reviewed at length with the patient today.  The patient does not have concerns regarding medicines.  The following changes have been made:  no change  Labs/ tests ordered today include:   No orders of the defined types were placed in this encounter.  Disposition:   FU with me in 6 months  Signed, Lauree Chandler, MD 04/14/2017 1:09 PM    [facility] [address], [place], [place]  [address] Phone: [phone]; Fax: [phone]

## 2017-04-15 ENCOUNTER — Ambulatory Visit: Payer: Medicare Other | Admitting: Cardiovascular Disease

## 2017-04-21 ENCOUNTER — Ambulatory Visit (INDEPENDENT_AMBULATORY_CARE_PROVIDER_SITE_OTHER): Payer: Medicare Other | Admitting: Family Medicine

## 2017-04-21 ENCOUNTER — Encounter: Payer: Self-pay | Admitting: Family Medicine

## 2017-04-21 VITALS — BP 100/52 | HR 91 | Temp 99.2°F | Resp 14 | Ht 66.0 in | Wt 132.0 lb

## 2017-04-21 DIAGNOSIS — E119 Type 2 diabetes mellitus without complications: Secondary | ICD-10-CM | POA: Diagnosis not present

## 2017-04-21 DIAGNOSIS — R634 Abnormal weight loss: Secondary | ICD-10-CM

## 2017-04-21 DIAGNOSIS — I10 Essential (primary) hypertension: Secondary | ICD-10-CM

## 2017-04-21 DIAGNOSIS — Z9181 History of falling: Secondary | ICD-10-CM

## 2017-04-21 DIAGNOSIS — R350 Frequency of micturition: Secondary | ICD-10-CM | POA: Diagnosis not present

## 2017-04-21 MED ORDER — LISINOPRIL 10 MG PO TABS: 10.0000 mg | ORAL_TABLET | Freq: Every day | ORAL | 1 refills | Status: DC

## 2017-04-21 NOTE — Patient Instructions (Addendum)
F/u in 2.5 month,call if you need mebefore  Stop lisinopril/hctz , blood pressure is too low. New is lisinopril 10 mg one daily, start this today  You need to get your eye exam, you are referred to Dr Gershon Crane   HBA1C, chem 7 and EGFR  Non fasting  1 week before next visit   You are being referred to home health as discussed , we will call your daughter Davy Pique at [phone] and will lv a msg if not available  Ensure will be requested again, vanilla liquid

## 2017-04-21 NOTE — Assessment & Plan Note (Signed)
Overcorrected, discontinue the curent med, start lisinopril

## 2017-04-26 ENCOUNTER — Encounter: Payer: Self-pay | Admitting: Family Medicine

## 2017-04-26 ENCOUNTER — Telehealth: Payer: Self-pay | Admitting: Family Medicine

## 2017-04-26 DIAGNOSIS — C8330 Diffuse large B-cell lymphoma, unspecified site: Secondary | ICD-10-CM

## 2017-04-26 DIAGNOSIS — R531 Weakness: Secondary | ICD-10-CM

## 2017-04-26 DIAGNOSIS — Z9181 History of falling: Secondary | ICD-10-CM

## 2017-04-26 NOTE — Assessment & Plan Note (Signed)
Increased wetting episodes, due to limitations from osteoarthritis and also generalized weakness. Will benefit from assistance with incontinence supplies if these can be made available through an assistance program, will reach out for help

## 2017-04-26 NOTE — Assessment & Plan Note (Signed)
Poor appetite with weight loss currently undergoing chemotherapy. Benefited from ensure supplements and enjoyed vanilla, will request additional supplements through assist program, family very appreciative

## 2017-04-26 NOTE — Assessment & Plan Note (Signed)
Severe generalized osteoarthritis with weakness from wasting and loss of muscle mass, refer to home health to evaluate for in home PT twice weekly, and assistance in home with ADL's as available for frail elderly patient currently receiving chemo for newly diagnosed lymphoma

## 2017-04-26 NOTE — Telephone Encounter (Signed)
Please refer pt to home health  Looking for ANY and all assistance she can get at home while receiving chemo for lymphoma, she is frail, elderly. Lives with daughter who has to work so on her own a lot. High fall risk , would benefit from a few hrs of sitting greatly Incontinence supplies?? In home PT/OT?  Pls rewquest liquid vanilla ensure , appreciated greatly, [facility] support crew.Warren Lacy, now [place] Ropes I think is the contact person, may verify with Albina Billet

## 2017-04-26 NOTE — Progress Notes (Signed)
   [name]     MRN: [id number]      DOB: 05-Nov-1933   HPI [name] is here for follow up and re-evaluation of chronic medical conditions, medication management and review of any available recent lab and radiology data.  She is accompanied by her daughter who is her primary caregiver. She is currently receiving chemotherapy for diffuse large B cell lymphoma, which started in May 2018.  Both the patient and her daughter report increased weakness and fatigue, with poor appetite and increased fall risk. SHe does have a walker which she has depended on prior to this, however , as she has weakened her risk of falls is greater , and her fatigue has also increased C/o increased episodes of incontinece due to reduced mobility, would benefit from help with incontinence supplies  ROS Denies recent fever or chills. Denies sinus pressure, nasal congestion, ear pain or sore throat. Denies chest congestion, productive cough or wheezing. Denies chest pains, palpitations and leg swelling Denies abdominal pain, nausea, vomiting,diarrhea or constipation.   Denies headaches, seizures, numbness, or tingling.  Denies skin break down or rash.   PE  BP (!) 100/52 (BP Location: Right Arm, Patient Position: Sitting, Cuff Size: Normal)   Pulse 91   Temp 99.2 F (37.3 C)   Resp 14   Ht 5\' 6"  (1.676 m)   Wt 132 lb (59.9 kg)   SpO2 99%   BMI 21.31 kg/m   Patient alert and oriented and in no cardiopulmonary distress.  HEENT: No facial asymmetry, EOMI,   oropharynx pink and moist.  Neck supple no JVD, no mass.  Chest: Clear to auscultation bilaterally.  CVS: S1, S2 no murmurs, no S3.Regular rate.  ABD: Soft non tender.   Ext: No edema  VQ:[id number]  decreased ROM spine, shoulders, hips and knees.  Skin: Intact, no ulcerations or rash noted.  Psych: Good eye contact,flat  affect. Memory intact not anxious mildly depressed appearing.  CNS: CN 2-12 intact, power,  normal throughout.no  focal deficits noted.   Assessment & Plan  Essential hypertension Overcorrected, discontinue the curent med, start lisinopril  Loss of weight Poor appetite with weight loss currently undergoing chemotherapy. Benefited from ensure supplements and enjoyed vanilla, will request additional supplements through assist program, family very appreciative  At high risk for falls Severe generalized osteoarthritis with weakness from wasting and loss of muscle mass, refer to home health to evaluate for in home PT twice weekly, and assistance in home with ADL's as available for frail elderly patient currently receiving chemo for newly diagnosed lymphoma  Urinary frequency Increased wetting episodes, due to limitations from osteoarthritis and also generalized weakness. Will benefit from assistance with incontinence supplies if these can be made available through an assistance program, will reach out for help  Diffuse large B cell lymphoma [facility]) Being treated with chemotherapy , started in May 2018, reports extremity pain and weakness

## 2017-04-26 NOTE — Assessment & Plan Note (Signed)
Being treated with chemotherapy , started in May 2018, reports extremity pain and weakness

## 2017-04-26 NOTE — Telephone Encounter (Signed)
Referral sent and daughter coming to collect ensure

## 2017-04-27 ENCOUNTER — Telehealth: Payer: Self-pay | Admitting: Family Medicine

## 2017-04-27 NOTE — Telephone Encounter (Signed)
New Message  Megan from [facility] voiced she received paper fax yesterday and they can do everything besides the OT due to not having an OT in the area of [place] and wanting to know if she can get "okay" for a therapist for the OT need by MD-Simpson.  Please f/u

## 2017-04-28 NOTE — Telephone Encounter (Signed)
Follow up  [name] voiced to disregard message below.

## 2017-04-29 ENCOUNTER — Ambulatory Visit (HOSPITAL_COMMUNITY)
Admission: RE | Admit: 2017-04-29 | Discharge: 2017-04-29 | Disposition: A | Discharge status: Home / Self Care | Payer: Medicare Other | Source: Ambulatory Visit | Attending: Oncology | Admitting: Oncology

## 2017-04-29 DIAGNOSIS — N281 Cyst of kidney, acquired: Secondary | ICD-10-CM | POA: Insufficient documentation

## 2017-04-29 DIAGNOSIS — M12851 Other specific arthropathies, not elsewhere classified, right hip: Secondary | ICD-10-CM | POA: Diagnosis not present

## 2017-04-29 DIAGNOSIS — I517 Cardiomegaly: Secondary | ICD-10-CM | POA: Insufficient documentation

## 2017-04-29 DIAGNOSIS — M12852 Other specific arthropathies, not elsewhere classified, left hip: Secondary | ICD-10-CM | POA: Diagnosis not present

## 2017-04-29 DIAGNOSIS — I700 Atherosclerosis of aorta: Secondary | ICD-10-CM | POA: Diagnosis not present

## 2017-04-29 DIAGNOSIS — M47816 Spondylosis without myelopathy or radiculopathy, lumbar region: Secondary | ICD-10-CM | POA: Insufficient documentation

## 2017-04-29 DIAGNOSIS — M5136 Other intervertebral disc degeneration, lumbar region: Secondary | ICD-10-CM | POA: Insufficient documentation

## 2017-04-29 DIAGNOSIS — C8330 Diffuse large B-cell lymphoma, unspecified site: Secondary | ICD-10-CM

## 2017-04-29 DIAGNOSIS — K5900 Constipation, unspecified: Secondary | ICD-10-CM | POA: Insufficient documentation

## 2017-04-29 LAB — GLUCOSE, CAPILLARY: GLUCOSE-CAPILLARY: 94 mg/dL (ref 65–99)

## 2017-04-29 MED ORDER — FLUDEOXYGLUCOSE F - 18 (FDG) INJECTION
6.7000 | Freq: Once | INTRAVENOUS | Status: AC | PRN
  Administered 2017-04-29: 6.7 via INTRAVENOUS

## 2017-05-02 ENCOUNTER — Telehealth: Payer: Self-pay | Admitting: Family Medicine

## 2017-05-02 NOTE — Telephone Encounter (Signed)
Spoke to [place] at [facility], informed of message below.

## 2017-05-02 NOTE — Telephone Encounter (Signed)
Noted,let her know thanks

## 2017-05-02 NOTE — Telephone Encounter (Signed)
Mariann Laster from [facility] called and left message on nurse line regarding patient. She states she received orders for patient to have nursing/PT/OT/home health aide with the requested start of care date being within 10 days. She states she reached out to patient's daugher, Davy Pique, but has been unable to reach her, figures she is working. Wanted to let Dr Moshe Cipro know start of care would be this Mon or Tues.   She can be reached at [phone]

## 2017-05-03 ENCOUNTER — Encounter (HOSPITAL_COMMUNITY): Payer: Self-pay

## 2017-05-03 ENCOUNTER — Encounter (HOSPITAL_COMMUNITY): Payer: Medicare Other | Attending: Oncology

## 2017-05-03 ENCOUNTER — Encounter (HOSPITAL_BASED_OUTPATIENT_CLINIC_OR_DEPARTMENT_OTHER): Payer: Medicare Other | Admitting: Oncology

## 2017-05-03 ENCOUNTER — Encounter: Payer: Self-pay | Admitting: *Deleted

## 2017-05-03 VITALS — BP 108/59 | HR 95 | Temp 98.0°F | Resp 18 | Wt 146.2 lb

## 2017-05-03 DIAGNOSIS — Z5111 Encounter for antineoplastic chemotherapy: Secondary | ICD-10-CM | POA: Diagnosis not present

## 2017-05-03 DIAGNOSIS — C8330 Diffuse large B-cell lymphoma, unspecified site: Secondary | ICD-10-CM

## 2017-05-03 DIAGNOSIS — Z5112 Encounter for antineoplastic immunotherapy: Secondary | ICD-10-CM | POA: Diagnosis not present

## 2017-05-03 DIAGNOSIS — D500 Iron deficiency anemia secondary to blood loss (chronic): Secondary | ICD-10-CM | POA: Diagnosis present

## 2017-05-03 LAB — CBC WITH DIFFERENTIAL/PLATELET
BASOS ABS: 0 10*3/uL (ref 0.0–0.1)
BASOS PCT: 1 %
EOS ABS: 0 10*3/uL (ref 0.0–0.7)
EOS PCT: 1 %
HCT: 28 % — ABNORMAL LOW (ref 36.0–46.0)
Hemoglobin: 9 g/dL — ABNORMAL LOW (ref 12.0–15.0)
Lymphocytes Relative: 24 %
Lymphs Abs: 0.7 10*3/uL (ref 0.7–4.0)
MCH: 32.4 pg (ref 26.0–34.0)
MCHC: 32.1 g/dL (ref 30.0–36.0)
MCV: 100.7 fL — ABNORMAL HIGH (ref 78.0–100.0)
Monocytes Absolute: 0.5 10*3/uL (ref 0.1–1.0)
Monocytes Relative: 18 %
NEUTROS PCT: 56 %
Neutro Abs: 1.6 10*3/uL — ABNORMAL LOW (ref 1.7–7.7)
PLATELETS: 286 10*3/uL (ref 150–400)
RBC: 2.78 MIL/uL — AB (ref 3.87–5.11)
RDW: 19.5 % — ABNORMAL HIGH (ref 11.5–15.5)
WBC: 2.8 10*3/uL — AB (ref 4.0–10.5)

## 2017-05-03 LAB — COMPREHENSIVE METABOLIC PANEL
ALBUMIN: 3.1 g/dL — AB (ref 3.5–5.0)
ALT: 14 U/L (ref 14–54)
AST: 27 U/L (ref 15–41)
Alkaline Phosphatase: 57 U/L (ref 38–126)
Anion gap: 8 (ref 5–15)
BUN: 26 mg/dL — AB (ref 6–20)
CHLORIDE: 103 mmol/L (ref 101–111)
CO2: 30 mmol/L (ref 22–32)
CREATININE: 1.02 mg/dL — AB (ref 0.44–1.00)
Calcium: 9.1 mg/dL (ref 8.9–10.3)
GFR calc non Af Amer: 51 mL/min — ABNORMAL LOW (ref >60)
GFR, EST AFRICAN AMERICAN: 59 mL/min — AB (ref >60)
GLUCOSE: 127 mg/dL — AB (ref 65–99)
Potassium: 3.6 mmol/L (ref 3.5–5.1)
SODIUM: 141 mmol/L (ref 135–145)
Total Bilirubin: 0.5 mg/dL (ref 0.3–1.2)
Total Protein: 5.8 g/dL — ABNORMAL LOW (ref 6.5–8.1)

## 2017-05-03 LAB — URIC ACID: URIC ACID, SERUM: 2.7 mg/dL (ref 2.3–6.6)

## 2017-05-03 LAB — PHOSPHORUS: Phosphorus: 3.1 mg/dL (ref 2.5–4.6)

## 2017-05-03 LAB — LACTATE DEHYDROGENASE: LDH: 162 U/L (ref 98–192)

## 2017-05-03 MED ORDER — PEGFILGRASTIM 6 MG/0.6ML ~~LOC~~ PSKT
6.0000 mg | PREFILLED_SYRINGE | Freq: Once | SUBCUTANEOUS | Status: AC
  Administered 2017-05-03: 6 mg via SUBCUTANEOUS
  Filled 2017-05-03: qty 0.6

## 2017-05-03 MED ORDER — DOXORUBICIN HCL CHEMO IV INJECTION 2 MG/ML
50.0000 mg/m2 | Freq: Once | INTRAVENOUS | Status: AC
  Administered 2017-05-03: 84 mg via INTRAVENOUS
  Filled 2017-05-03: qty 42

## 2017-05-03 MED ORDER — DEXAMETHASONE SODIUM PHOSPHATE 10 MG/ML IJ SOLN
10.0000 mg | Freq: Once | INTRAMUSCULAR | Status: AC
  Administered 2017-05-03: 10 mg via INTRAVENOUS
  Filled 2017-05-03: qty 1

## 2017-05-03 MED ORDER — PALONOSETRON HCL INJECTION 0.25 MG/5ML
INTRAVENOUS | Status: AC
  Filled 2017-05-03: qty 5

## 2017-05-03 MED ORDER — SODIUM CHLORIDE 0.9 % IV SOLN
750.0000 mg/m2 | Freq: Once | INTRAVENOUS | Status: AC
  Administered 2017-05-03: 1260 mg via INTRAVENOUS
  Filled 2017-05-03: qty 50

## 2017-05-03 MED ORDER — ACETAMINOPHEN 325 MG PO TABS
650.0000 mg | ORAL_TABLET | Freq: Once | ORAL | Status: AC
  Administered 2017-05-03: 650 mg via ORAL
  Filled 2017-05-03: qty 2

## 2017-05-03 MED ORDER — SODIUM CHLORIDE 0.9% FLUSH
10.0000 mL | INTRAVENOUS | Status: DC | PRN
  Administered 2017-05-03: 10 mL
  Filled 2017-05-03: qty 10

## 2017-05-03 MED ORDER — SODIUM CHLORIDE 0.9 % IV SOLN
1.0000 mg | Freq: Once | INTRAVENOUS | Status: AC
  Administered 2017-05-03: 1 mg via INTRAVENOUS
  Filled 2017-05-03: qty 1

## 2017-05-03 MED ORDER — SODIUM CHLORIDE 0.9 % IV SOLN
Freq: Once | INTRAVENOUS | Status: AC
  Administered 2017-05-03: 10:00:00 via INTRAVENOUS

## 2017-05-03 MED ORDER — PALONOSETRON HCL INJECTION 0.25 MG/5ML
0.2500 mg | Freq: Once | INTRAVENOUS | Status: AC
  Administered 2017-05-03: 0.25 mg via INTRAVENOUS
  Filled 2017-05-03: qty 5

## 2017-05-03 MED ORDER — SODIUM CHLORIDE 0.9 % IV SOLN: 10.0000 mg | Freq: Once | INTRAVENOUS | Status: DC

## 2017-05-03 MED ORDER — RITUXIMAB CHEMO INJECTION 500 MG/50ML
375.0000 mg/m2 | Freq: Once | INTRAVENOUS | Status: AC
  Administered 2017-05-03: 600 mg via INTRAVENOUS
  Filled 2017-05-03: qty 50

## 2017-05-03 MED ORDER — DIPHENHYDRAMINE HCL 25 MG PO CAPS
50.0000 mg | ORAL_CAPSULE | Freq: Once | ORAL | Status: AC
  Administered 2017-05-03: 50 mg via ORAL
  Filled 2017-05-03: qty 2

## 2017-05-03 MED ORDER — HEPARIN SOD (PORK) LOCK FLUSH 100 UNIT/ML IV SOLN
500.0000 [IU] | Freq: Once | INTRAVENOUS | Status: AC | PRN
  Administered 2017-05-03: 500 [IU]
  Filled 2017-05-03: qty 5

## 2017-05-03 NOTE — Progress Notes (Signed)
Patient tolerated infusions today without incidence. Patient discharged in stable condition via wheelchair with daughter. Patient to follow up as scheduled.

## 2017-05-03 NOTE — Patient Instructions (Addendum)
[facility] Discharge Instructions for Patients Receiving Chemotherapy    If you have a lab appointment with the [facility] please come in thru the  Main Entrance and check in at the main information desk   Today you received the following chemotherapy agents: rituximab, cytoxan, vincristine, adriamycin  You will also have the Neulasta On-pro in place. It will begin giving you the medication around 7 pm tomorrow (7/18 Wednesday) and you can safely remove it at 8 pm.    To help prevent nausea and vomiting after your treatment, we encourage you to take your nausea medication as prescribed.   If you develop nausea and vomiting, or diarrhea that is not controlled by your medication, call the clinic.  The clinic phone number is [phone]. Office hours are Monday-Friday 8:30am-5:00pm.  BELOW ARE SYMPTOMS THAT SHOULD BE REPORTED IMMEDIATELY:  *FEVER GREATER THAN 101.0 F  *CHILLS WITH OR WITHOUT FEVER  NAUSEA AND VOMITING THAT IS NOT CONTROLLED WITH YOUR NAUSEA MEDICATION  *UNUSUAL SHORTNESS OF BREATH  *UNUSUAL BRUISING OR BLEEDING  TENDERNESS IN MOUTH AND THROAT WITH OR WITHOUT PRESENCE OF ULCERS  *URINARY PROBLEMS  *BOWEL PROBLEMS  UNUSUAL RASH Items with * indicate a potential emergency and should be followed up as soon as possible. If you have an emergency after office hours please contact your primary care physician or go to the nearest emergency department.  Please call the clinic during office hours if you have any questions or concerns.   You may also contact the Patient Navigator at [phone] should you have any questions or need assistance in obtaining follow up care.      Resources For Cancer Patients and their Caregivers ? American Cancer Society: Can assist with transportation, wigs, general needs, runs Look Good Feel Better.        702-722-1940 ? Cancer Care: Provides financial assistance, online support groups, medication/co-pay  assistance.  1-800-813-HOPE 418-108-9917) ? [place] Assists [place] cancer patients and their families through emotional , educational and financial support.  (229)643-2581 ? [place] DSS Where to apply for food stamps, Medicaid and utility assistance. 579 477 5432 ? RC[employer]: Transportation to medical appointments. 6194349202 ? Social Security Administration: May apply for disability if have a Stage IV cancer. [phone] [phone] ? [employer], Disability and Transit Services: Assists with nutrition, care and transit needs. 210-468-0746

## 2017-05-03 NOTE — Progress Notes (Signed)
[facility] Clinical Social Work  Clinical Social Work was referred by Futures trader for assessment of psychosocial needs due to possible assistance at home/need for [facility] PT/OT . Clinical Social Worker met with patient and daughter at [facility] to offer support and assess for needs.  CSW introduced self, explained role of CSW/Pt and Family Support Team, support groups and other resources to assist. Pt and daughter are very interested in [facility] services, they do not have a preference. CSW inquired about other care needs, such as transportation. Pt and daughter deny other needs, but were provided with handouts on CSW role and local resource options and Support Group.   CSW then reviewed chart and learned pt's PCP recently put in orders for [facility]. CSW updated [facility] team and pt and family that this appeared in the works.     Clinical Social Work interventions: Resource education and referral  Loren Racer, LCSW, OSW-C [place] Tuesdays   [phone]

## 2017-05-03 NOTE — Progress Notes (Signed)
Neulasta placed on right posterior arm. Patient instructed that it will begin giving her the medication around 7 pm and she can safely remove it at 8 pm. Patient verbalizes understanding and family members aware of time to remove. They were instructed to call if they have any problems with it.

## 2017-05-03 NOTE — Progress Notes (Signed)
[place], [place] [address]   CLINIC:  Medical Oncology/Hematology  PCP:  Fayrene Helper, MD [address], [address] [place] [address] [phone]   REASON FOR VISIT:  Follow-up for Diffuse Large B-cell Lymphoma (DLBCL):   CURRENT THERAPY: R-CHOP  BRIEF ONCOLOGIC HISTORY:    Diffuse large B cell lymphoma ([place])   12/20/2016 Imaging    CT abd/pelvis: IMPRESSION: 1. Extensive abdominal, pelvic and retrocrural lymphadenopathy, most pronounced in the retroperitoneum, as detailed above. Findings are highly suspicious for lymphoproliferative disorder and further evaluation is strongly recommended at this time. 2. Colonic diverticulosis without evidence of acute diverticulitis at this time. 3. Aortic atherosclerosis. 4. Additional incidental findings, as above.      12/30/2016 PET scan    IMPRESSION: 1. Extensive hypermetabolic abdominal and pelvic adenopathy, including bulky retroperitoneal adenopathy lifting of the abdominal aorta away from the spine. One of the larger lymph nodes measures 8.4 cm in short axis with maximum SUV 25.0. 2. Small pleural focus of tumor in the left posteromedial chest, maximum SUV only 4.2. This represents the only thoracic involvement. 3. Other imaging findings of potential clinical significance: Atherosclerotic aortic arch. Mild cardiomegaly. Mild atelectasis in both lower lobes. Hyperdense left renal cysts as on prior CT. Mild left hydronephrosis likely due to extrinsic mass effect on the left ureter. Right greater than left degenerative arthropathy of the hips.      01/31/2017 Surgery    Exploratory lap Arnoldo Morale)       01/31/2017 Pathology Results    Lymph node biospy: DLBCL, high-grade, monoclonal kappa restricted.       02/07/2017 Initial Diagnosis    Diffuse large B cell lymphoma ([facility])     02/14/2017 Echocardiogram    Pre-chemo ECHO: EF 55%.       03/01/2017 Bone Marrow Biopsy   No bone marrow involvement by lymphoma. [facility] cytogenetics      03/01/2017 -  Chemotherapy    The patient had DOXOrubicin (ADRIAMYCIN) chemo injection 90 mg, 50 mg/m2 = 90 mg, Intravenous,  Once, 1 of 6 cycles  palonosetron (ALOXI) injection 0.25 mg, 0.25 mg, Intravenous,  Once, 1 of 6 cycles  pegfilgrastim (NEULASTA ONPRO KIT) injection 6 mg, 6 mg, Subcutaneous, Once, 1 of 6 cycles  vinCRIStine (ONCOVIN) 2 mg in sodium chloride 0.9 % 50 mL chemo infusion, 2 mg, Intravenous,  Once, 1 of 6 cycles  riTUXimab (RITUXAN) 700 mg in sodium chloride 0.9 % 250 mL (2.1875 mg/mL) chemo infusion, 375 mg/m2 = 700 mg, Intravenous,  Once, 1 of 6 cycles Dose modification: 375 mg/m2 (original dose 375 mg/m2, Cycle 1)  cyclophosphamide (CYTOXAN) 1,340 mg in sodium chloride 0.9 % 250 mL chemo infusion, 750 mg/m2 = 1,340 mg, Intravenous,  Once, 1 of 6 cycles  for chemotherapy treatment.        04/29/2017 PET scan    1. Overall marked improvement with prominent reduction in size and activity of the retroperitoneal and pelvic adenopathy. The dominant prior periaortic node has reduced in size from 8.4 cm in short axis to 3.4 cm, and from an SUV of 15.02 current SUV of 3.7. This current SUV qualifies as Deauville 4 disease. 2. Prior focus of pleural activity in the left hemithorax has resolved. 3. The hydronephrosis in the left kidney has resolved although there could still be some mild residual collecting system wall thickening. 4. Other imaging findings of potential clinical significance: Aortic Atherosclerosis (ICD10-I70.0). Cardiomegaly. Prominent stool throughout the colon  favors constipation. Small complex left renal cysts. Degenerative arthropathy of both hips. Lumbar spondylosis and degenerative disc disease.        INTERVAL HISTORY:  Ms. Biederman 83 y.o. female returns for follow-up and to review results of recent imaging and pathology.   Patient presents today for cycle 4 of R-CHOP. She  states that she has been doing well. She had some nausea day 1 after getting chemo with the last cycle. No vomiting. She took some zofran and her symptoms have resolved. Her daughter states that she is still not very active and sits in her wheelchair most of the time at home. Continues to have leg swelling bilaterally. Continues to have constipation but resolves when she takes miralax. Neuropathy stable.  REVIEW OF SYSTEMS:  Review of Systems  Constitutional: Negative for appetite change, diaphoresis, fatigue, fever and unexpected weight change.  HENT:  Negative.   Eyes: Negative.   Respiratory: Negative.  Negative for shortness of breath.   Cardiovascular: Positive for leg swelling (chronic in left leg).  Gastrointestinal: Positive for constipation (resolves with laxatives). Negative for abdominal pain.  Endocrine: Negative.  Negative for hot flashes.  Genitourinary: Negative.    Musculoskeletal: Negative.   Neurological:       Neuropathy in fingertips, worse in left hand than right hand  Hematological: Negative.   Psychiatric/Behavioral: Negative.      PAST MEDICAL/SURGICAL HISTORY:  Past Medical History:  Diagnosis Date  . Arthritis of knee    bilateral    . Chronic renal insufficiency   . Diabetes mellitus, type 2 ([facility])   . GERD (gastroesophageal reflux disease)   . History of kidney stones   . Hypertension   . Iron deficiency anemia 12/28/2016  . Lymphadenopathy, abdominal 12/28/2016  . Osteopenia   . PAD (peripheral artery disease) ([facility])   . Personal history of colonic polyps 3 years ago    colonoscopy    Past Surgical History:  Procedure Laterality Date  . BIOPSY  12/16/2016   Procedure: BIOPSY;  Surgeon: Rogene Houston, MD;  [facility];  Service: Endoscopy;;  gastric polyp  . bunionectomy bilateral  feet    . COLONOSCOPY N/A 12/16/2016   Procedure: COLONOSCOPY;  Surgeon: Rogene Houston, MD;  [facility];  Service: Endoscopy;  Laterality: N/A;   2:15  . DENTAL SURGERY    . ESOPHAGOGASTRODUODENOSCOPY N/A 12/16/2016   Procedure: ESOPHAGOGASTRODUODENOSCOPY (EGD);  Surgeon: Rogene Houston, MD;  [facility];  Service: Endoscopy;  Laterality: N/A;  . gum disease  2010   treated by Dr. Andree Elk   . LAPAROTOMY N/A 01/31/2017   Procedure: EXPLORATORY LAPAROTOMY;  Surgeon: Aviva Signs, MD;  Location: [facility];  Service: General;  Laterality: N/A;  . Left knee manipulation  under anasthesia due to contracture -  03/08/08   Dr. Aline Brochure   . left knee replacement  08/14/08   Dr. Aline Brochure   . LYMPH NODE BIOPSY  01/31/2017   Procedure: LYMPH NODE BIOPSY;  Surgeon: Aviva Signs, MD;  Location: [facility];  Service: General;;  . PORTACATH PLACEMENT Left 02/21/2017   Procedure: INSERTION PORT-A-CATH;  Surgeon: Aviva Signs, MD;  Location: [facility];  Service: General;  Laterality: Left;  . TOTAL ABDOMINAL HYSTERECTOMY     bleeding   . TOTAL KNEE ARTHROPLASTY Right 05/12/2016   Procedure: TOTAL KNEE ARTHROPLASTY;  Surgeon: [name], MD;  Location: [facility];  Service: Orthopedics;  Laterality: Right;  . WISDOM TOOTH EXTRACTION  SOCIAL HISTORY:  Social History   Social History  . Marital status: Widowed    Spouse name: N/A  . Number of children: 3  . Years of education: college    Occupational History  . retired Chemical engineer  Retired   Social History Main Topics  . Smoking status: Former Smoker    Packs/day: 0.25    Years: 30.00    Types: Cigarettes    Quit date: 10/19/1999  . Smokeless tobacco: Never Used  . Alcohol use No  . Drug use: No  . Sexual activity: Not Currently   Other Topics Concern  . Not on file   Social History Narrative  . No narrative on file    FAMILY HISTORY:  Family History  Problem Relation Age of Onset  . Colon cancer Father   . Cancer Mother   . Stroke Mother   . Hypertension Brother   . Dementia Sister   . Multiple sclerosis Daughter   . Pseudotumor cerebri Daughter   . Neuropathy  Daughter     CURRENT MEDICATIONS:  Outpatient Encounter Prescriptions as of 02/08/2017  Medication Sig Note  . acetaminophen (TYLENOL) 325 MG tablet Take 650 mg by mouth every 4 (four) hours as needed for mild pain.    Marland Kitchen aspirin EC 81 MG tablet Take 81 mg by mouth daily. 01/24/2017: On hold due to upcoming procedure.  . Calcium Carb-Cholecalciferol (CALCIUM 600+D) 600-800 MG-UNIT TABS Take 1 tablet by mouth 2 (two) times daily.   . diphenhydrAMINE (BENADRYL) 50 MG tablet Take 50 mg by mouth at bedtime as needed for itching.   Marland Kitchen lisinopril-hydrochlorothiazide (PRINZIDE,ZESTORETIC) 20-12.5 MG tablet TAKE ONE TABLET BY MOUTH ONCE DAILY   . magnesium hydroxide (MILK OF MAGNESIA) 400 MG/5ML suspension Take 30 mLs by mouth every other day.   . multivitamin (THERAGRAN) per tablet Take 1 tablet by mouth daily.   01/24/2017: Time varies  . pantoprazole (PROTONIX) 40 MG tablet Take 1 tablet (40 mg total) by mouth daily before breakfast.   . Pediatric Multivitamins-Iron (FLINTSTONES PLUS IRON) chewable tablet Chew 1 tablet by mouth 2 (two) times daily.   . polyethylene glycol (MIRALAX / GLYCOLAX) packet Take 17 g by mouth daily as needed (constipation).  12/14/2016: Hasn't started yet.  . traMADol (ULTRAM) 50 MG tablet Take 1 tablet (50 mg total) by mouth every 6 (six) hours as needed for moderate pain.    No facility-administered encounter medications on file as of 02/08/2017.     ALLERGIES:  Allergies  Allergen Reactions  . Codeine Hives  . Fosamax [Alendronate Sodium] Other (See Comments)    Pt reports excessive urination on the day she took the pill     PHYSICAL EXAM:  ECOG Performance status: 2 - Symptomatic; requires occasional assistance    Physical Exam  Constitutional: She is oriented to person, place, and time.  HENT:  Head: Normocephalic and atraumatic.  Mouth/Throat: Oropharynx is clear and moist.  Eyes: Pupils are equal, round, and reactive to light. Conjunctivae are normal.  Neck:  Normal range of motion. Neck supple.  Cardiovascular: Normal rate and regular rhythm.  Exam reveals no gallop and no friction rub.   No murmur heard. Pulmonary/Chest: Effort normal. No respiratory distress. She has no wheezes. She has no rales. She exhibits no tenderness.  Abdominal: Soft. Bowel sounds are normal. She exhibits no distension. There is no tenderness. There is no rebound.  Musculoskeletal: Normal range of motion.  2+ bilateral ankle edema   Lymphadenopathy:  She has no axillary adenopathy.       Right: No supraclavicular adenopathy present.       Left: No supraclavicular adenopathy present.  Neurological: She is alert and oriented to person, place, and time.  Skin: Skin is warm and dry.  Psychiatric: Mood, memory, affect and judgment normal.  Nursing note and vitals reviewed.    LABORATORY DATA:  I have reviewed the labs as listed.  CBC    Component Value Date/Time   WBC 4.6 04/12/2017 0931   RBC 2.78 (L) 04/12/2017 0931   HGB 8.7 (L) 04/12/2017 0931   HCT 27.2 (L) 04/12/2017 0931   PLT 379 04/12/2017 0931   MCV 97.8 04/12/2017 0931   MCH 31.3 04/12/2017 0931   MCHC 32.0 04/12/2017 0931   RDW 19.9 (H) 04/12/2017 0931   LYMPHSABS 0.7 04/12/2017 0931   MONOABS 0.8 04/12/2017 0931   EOSABS 0.0 04/12/2017 0931   BASOSABS 0.0 04/12/2017 0931   CMP Latest Ref Rng & Units 04/12/2017 03/22/2017 03/11/2017  Glucose 65 - 99 mg/dL 102(H) 150(H) 118(H)  BUN 6 - 20 mg/dL 25(H) 34(H) 25(H)  Creatinine 0.44 - 1.00 mg/dL 1.03(H) 1.11(H) 0.91  Sodium 135 - 145 mmol/L 140 140 139  Potassium 3.5 - 5.1 mmol/L 3.7 3.8 3.4(L)  Chloride 101 - 111 mmol/L 104 103 101  CO2 22 - 32 mmol/L '29 29 30  '$ Calcium 8.9 - 10.3 mg/dL 8.9 9.1 9.3  Total Protein 6.5 - 8.1 g/dL 5.8(L) 6.3(L) 6.5  Total Bilirubin 0.3 - 1.2 mg/dL 0.4 0.6 0.6  Alkaline Phos 38 - 126 U/L 56 48 58  AST 15 - 41 U/L '20 24 26  '$ ALT 14 - 54 U/L 11(L) 12(L) 13(L)    PENDING LABS:    DIAGNOSTIC IMAGING:  PET scan:  12/30/16       PATHOLOGY:  Lymph node biopsy: 01/31/17    Cytology: 01/31/17    ASSESSMENT & PLAN:  Stage IV Diffuse Large B-cell Lymphoma (DLBCL) with small pleural tumor focus on the posteromedial aspect of left chest; no bone marrow involvement. IPI score of 3. She is in the high to intermediate risk group. Predicted 4 year progression free survival rate is 43%.   Decreased her vincristine to '1mg'$  from '2mg'$  from cycle 2 onwards  due to worsening neuropathy.   PLAN: Proceed with cycle 4 of R-CHOP today. Labs reviewed.  Reviewed her restaging PET-CT with the patient and her daughter and she had a great response. Plan to restage again with PET-CT after cycle 6. RTC in 3 weeks for chemo and follow up.  All questions were answered to patient's stated satisfaction. Encouraged patient to call with any new concerns or questions before her next visit to the cancer center and we can certain see her sooner, if needed.      This note was electronically signed by:  Twana First, MD 05/03/17

## 2017-05-04 ENCOUNTER — Telehealth: Payer: Self-pay | Admitting: Family Medicine

## 2017-05-04 NOTE — Telephone Encounter (Signed)
Callback for Sharyn Lull is [phone]

## 2017-05-04 NOTE — Telephone Encounter (Signed)
Yes oK verbal order  Also pls pass on message for her daughter to collect the ensure here for her mom, [name], it is in the office

## 2017-05-04 NOTE — Telephone Encounter (Signed)
Sharyn Lull, Warden/ranger from [facility], left message on nurse line regarding patient. She states she completed OT eval for patient this afternoon and would like verbal orders for patient to have OT once a week for one week, twice a week for three weeks, and then once a week for one week.  Please advise if I can give verbal order.

## 2017-05-05 ENCOUNTER — Encounter: Payer: Self-pay | Admitting: Cardiovascular Disease

## 2017-05-05 ENCOUNTER — Telehealth: Payer: Self-pay | Admitting: Family Medicine

## 2017-05-05 NOTE — Telephone Encounter (Signed)
Spoke to [place], informed of message below.

## 2017-05-05 NOTE — Telephone Encounter (Signed)
[name], physical therapist with [facility], called and left message on nurse line regarding patient. She states she had orders to do a PT eval on patient, and is now needing verbal orders to treat patine once a week for 1 week, four times for two weeks, and once a week for 1 week to focus on strength, balance, gait and transfers. Callback for Amy is [phone]

## 2017-05-08 NOTE — Telephone Encounter (Signed)
pls call and give verbal orders for recommended  therapy I do authorize them

## 2017-05-09 NOTE — Telephone Encounter (Signed)
T was two times a week for 4 weeks, not 4 times a week for two week, but she was given verbal order.

## 2017-05-24 ENCOUNTER — Encounter (HOSPITAL_COMMUNITY): Payer: Self-pay

## 2017-05-24 ENCOUNTER — Encounter (HOSPITAL_COMMUNITY): Payer: Medicare Other | Attending: Oncology

## 2017-05-24 ENCOUNTER — Encounter (HOSPITAL_COMMUNITY): Payer: Self-pay | Admitting: Adult Health

## 2017-05-24 ENCOUNTER — Encounter (HOSPITAL_BASED_OUTPATIENT_CLINIC_OR_DEPARTMENT_OTHER): Payer: Medicare Other | Admitting: Adult Health

## 2017-05-24 VITALS — BP 122/77 | HR 84 | Temp 98.6°F | Resp 18 | Wt 143.6 lb

## 2017-05-24 DIAGNOSIS — G620 Drug-induced polyneuropathy: Secondary | ICD-10-CM | POA: Diagnosis not present

## 2017-05-24 DIAGNOSIS — D500 Iron deficiency anemia secondary to blood loss (chronic): Secondary | ICD-10-CM | POA: Diagnosis present

## 2017-05-24 DIAGNOSIS — C8330 Diffuse large B-cell lymphoma, unspecified site: Secondary | ICD-10-CM

## 2017-05-24 DIAGNOSIS — Z5111 Encounter for antineoplastic chemotherapy: Secondary | ICD-10-CM

## 2017-05-24 DIAGNOSIS — Z5112 Encounter for antineoplastic immunotherapy: Secondary | ICD-10-CM | POA: Diagnosis not present

## 2017-05-24 DIAGNOSIS — C8333 Diffuse large B-cell lymphoma, intra-abdominal lymph nodes: Secondary | ICD-10-CM

## 2017-05-24 LAB — PHOSPHORUS: Phosphorus: 3.7 mg/dL (ref 2.5–4.6)

## 2017-05-24 LAB — COMPREHENSIVE METABOLIC PANEL
ALBUMIN: 3 g/dL — AB (ref 3.5–5.0)
ALT: 13 U/L — AB (ref 14–54)
AST: 24 U/L (ref 15–41)
Alkaline Phosphatase: 59 U/L (ref 38–126)
Anion gap: 6 (ref 5–15)
BUN: 29 mg/dL — AB (ref 6–20)
CHLORIDE: 107 mmol/L (ref 101–111)
CO2: 29 mmol/L (ref 22–32)
CREATININE: 0.93 mg/dL (ref 0.44–1.00)
Calcium: 9.1 mg/dL (ref 8.9–10.3)
GFR calc Af Amer: >60 mL/min (ref >60)
GFR, EST NON AFRICAN AMERICAN: 57 mL/min — AB (ref >60)
GLUCOSE: 116 mg/dL — AB (ref 65–99)
POTASSIUM: 3.5 mmol/L (ref 3.5–5.1)
SODIUM: 142 mmol/L (ref 135–145)
Total Bilirubin: 0.5 mg/dL (ref 0.3–1.2)
Total Protein: 5.8 g/dL — ABNORMAL LOW (ref 6.5–8.1)

## 2017-05-24 LAB — CBC WITH DIFFERENTIAL/PLATELET
Basophils Absolute: 0 10*3/uL (ref 0.0–0.1)
Basophils Relative: 1 %
EOS PCT: 1 %
Eosinophils Absolute: 0 10*3/uL (ref 0.0–0.7)
HCT: 28.6 % — ABNORMAL LOW (ref 36.0–46.0)
Hemoglobin: 9.1 g/dL — ABNORMAL LOW (ref 12.0–15.0)
LYMPHS ABS: 0.6 10*3/uL — AB (ref 0.7–4.0)
LYMPHS PCT: 16 %
MCH: 32.9 pg (ref 26.0–34.0)
MCHC: 31.8 g/dL (ref 30.0–36.0)
MCV: 103.2 fL — AB (ref 78.0–100.0)
MONO ABS: 0.9 10*3/uL (ref 0.1–1.0)
Monocytes Relative: 23 %
Neutro Abs: 2.3 10*3/uL (ref 1.7–7.7)
Neutrophils Relative %: 60 %
PLATELETS: 287 10*3/uL (ref 150–400)
RBC: 2.77 MIL/uL — AB (ref 3.87–5.11)
RDW: 18.5 % — ABNORMAL HIGH (ref 11.5–15.5)
WBC: 3.8 10*3/uL — AB (ref 4.0–10.5)

## 2017-05-24 LAB — LACTATE DEHYDROGENASE: LDH: 188 U/L (ref 98–192)

## 2017-05-24 LAB — URIC ACID: Uric Acid, Serum: 3.1 mg/dL (ref 2.3–6.6)

## 2017-05-24 MED ORDER — SODIUM CHLORIDE 0.9% FLUSH
10.0000 mL | INTRAVENOUS | Status: DC | PRN
  Administered 2017-05-24: 10 mL
  Filled 2017-05-24: qty 10

## 2017-05-24 MED ORDER — RITUXIMAB CHEMO INJECTION 500 MG/50ML
375.0000 mg/m2 | Freq: Once | INTRAVENOUS | Status: AC
  Administered 2017-05-24: 600 mg via INTRAVENOUS
  Filled 2017-05-24: qty 50

## 2017-05-24 MED ORDER — PALONOSETRON HCL INJECTION 0.25 MG/5ML
0.2500 mg | Freq: Once | INTRAVENOUS | Status: AC
  Administered 2017-05-24: 0.25 mg via INTRAVENOUS
  Filled 2017-05-24: qty 5

## 2017-05-24 MED ORDER — SODIUM CHLORIDE 0.9 % IV SOLN: 10.0000 mg | Freq: Once | INTRAVENOUS | Status: DC

## 2017-05-24 MED ORDER — PEGFILGRASTIM 6 MG/0.6ML ~~LOC~~ PSKT
6.0000 mg | PREFILLED_SYRINGE | Freq: Once | SUBCUTANEOUS | Status: AC
  Administered 2017-05-24: 6 mg via SUBCUTANEOUS
  Filled 2017-05-24: qty 0.6

## 2017-05-24 MED ORDER — HEPARIN SOD (PORK) LOCK FLUSH 100 UNIT/ML IV SOLN
500.0000 [IU] | Freq: Once | INTRAVENOUS | Status: AC | PRN
  Administered 2017-05-24: 500 [IU]
  Filled 2017-05-24: qty 5

## 2017-05-24 MED ORDER — SODIUM CHLORIDE 0.9 % IV SOLN
Freq: Once | INTRAVENOUS | Status: AC
  Administered 2017-05-24: 12:00:00 via INTRAVENOUS

## 2017-05-24 MED ORDER — DIPHENHYDRAMINE HCL 25 MG PO CAPS
50.0000 mg | ORAL_CAPSULE | Freq: Once | ORAL | Status: AC
  Administered 2017-05-24: 50 mg via ORAL
  Filled 2017-05-24: qty 2

## 2017-05-24 MED ORDER — DOXORUBICIN HCL CHEMO IV INJECTION 2 MG/ML
50.0000 mg/m2 | Freq: Once | INTRAVENOUS | Status: AC
  Administered 2017-05-24: 84 mg via INTRAVENOUS
  Filled 2017-05-24: qty 42

## 2017-05-24 MED ORDER — ACETAMINOPHEN 325 MG PO TABS
650.0000 mg | ORAL_TABLET | Freq: Once | ORAL | Status: AC
  Administered 2017-05-24: 650 mg via ORAL
  Filled 2017-05-24: qty 2

## 2017-05-24 MED ORDER — SODIUM CHLORIDE 0.9 % IV SOLN
750.0000 mg/m2 | Freq: Once | INTRAVENOUS | Status: AC
  Administered 2017-05-24: 1260 mg via INTRAVENOUS
  Filled 2017-05-24: qty 50

## 2017-05-24 MED ORDER — DEXAMETHASONE SODIUM PHOSPHATE 10 MG/ML IJ SOLN
10.0000 mg | Freq: Once | INTRAMUSCULAR | Status: AC
  Administered 2017-05-24: 10 mg via INTRAVENOUS
  Filled 2017-05-24: qty 1

## 2017-05-24 NOTE — Progress Notes (Signed)
[name], [place] [address]   CLINIC:  Medical Oncology/Hematology  PCP:  Fayrene Helper, MD [address], [address] [name] [address] [phone]   REASON FOR VISIT:  Follow-up for Diffuse Large B-cell Lymphoma (DLBCL):   CURRENT THERAPY: R-CHOP chemotherapy, beginning 03/01/17   BRIEF ONCOLOGIC HISTORY:    Diffuse large B cell lymphoma ([place])   12/20/2016 Imaging    CT abd/pelvis: IMPRESSION: 1. Extensive abdominal, pelvic and retrocrural lymphadenopathy, most pronounced in the retroperitoneum, as detailed above. Findings are highly suspicious for lymphoproliferative disorder and further evaluation is strongly recommended at this time. 2. Colonic diverticulosis without evidence of acute diverticulitis at this time. 3. Aortic atherosclerosis. 4. Additional incidental findings, as above.      12/30/2016 PET scan    IMPRESSION: 1. Extensive hypermetabolic abdominal and pelvic adenopathy, including bulky retroperitoneal adenopathy lifting of the abdominal aorta away from the spine. One of the larger lymph nodes measures 8.4 cm in short axis with maximum SUV 25.0. 2. Small pleural focus of tumor in the left posteromedial chest, maximum SUV only 4.2. This represents the only thoracic involvement. 3. Other imaging findings of potential clinical significance: Atherosclerotic aortic arch. Mild cardiomegaly. Mild atelectasis in both lower lobes. Hyperdense left renal cysts as on prior CT. Mild left hydronephrosis likely due to extrinsic mass effect on the left ureter. Right greater than left degenerative arthropathy of the hips.      01/31/2017 Surgery    Exploratory lap Arnoldo Morale)       01/31/2017 Pathology Results    Lymph node biospy: DLBCL, high-grade, monoclonal kappa restricted.       02/07/2017 Initial Diagnosis    Diffuse large B cell lymphoma ([place])     02/14/2017 Echocardiogram    Pre-chemo ECHO: EF 55%.       03/01/2017 Bone Marrow Biopsy    No bone marrow involvement by lymphoma. [facility] cytogenetics      03/01/2017 -  Chemotherapy    The patient had DOXOrubicin (ADRIAMYCIN) chemo injection 90 mg, 50 mg/m2 = 90 mg, Intravenous,  Once, 1 of 6 cycles  palonosetron (ALOXI) injection 0.25 mg, 0.25 mg, Intravenous,  Once, 1 of 6 cycles  pegfilgrastim (NEULASTA ONPRO [place]) injection 6 mg, 6 mg, Subcutaneous, Once, 1 of 6 cycles  vinCRIStine (ONCOVIN) 2 mg in sodium chloride 0.9 % 50 mL chemo infusion, 2 mg, Intravenous,  Once, 1 of 6 cycles  riTUXimab (RITUXAN) 700 mg in sodium chloride 0.9 % 250 mL (2.1875 mg/mL) chemo infusion, 375 mg/m2 = 700 mg, Intravenous,  Once, 1 of 6 cycles Dose modification: 375 mg/m2 (original dose 375 mg/m2, Cycle 1)  cyclophosphamide (CYTOXAN) 1,340 mg in sodium chloride 0.9 % 250 mL chemo infusion, 750 mg/m2 = 1,340 mg, Intravenous,  Once, 1 of 6 cycles  for chemotherapy treatment.        04/29/2017 PET scan    1. Overall marked improvement with prominent reduction in size and activity of the retroperitoneal and pelvic adenopathy. The dominant prior periaortic node has reduced in size from 8.4 cm in short axis to 3.4 cm, and from an SUV of 15.02 current SUV of 3.7. This current SUV qualifies as Deauville 4 disease. 2. Prior focus of pleural activity in the left hemithorax has resolved. 3. The hydronephrosis in the left kidney has resolved although there could still be some mild residual collecting system wall thickening. 4. Other imaging findings of potential clinical significance: Aortic Atherosclerosis (ICD10-I70.0). Cardiomegaly.  Prominent stool throughout the colon favors constipation. Small complex left renal cysts. Degenerative arthropathy of both hips. Lumbar spondylosis and degenerative disc disease.        INTERVAL HISTORY:  [name] 83 y.o. female returns for follow-up for diffuse large B-cell lymphoma.   Due for cycle #5 R-CHOP chemotherapy  today.  She feels tired; "I don't have a lot of energy."  Her appetite is 25%; she is supplementing her diet with Ensure.  Denies fevers; reports some chills, "but I just stay cold."  Constipation is better by taking Miralax every other day.  Her dizziness is improving; symptoms are worse when going from a seated to standing position. Encouraged her to drink more fluids, as this may be d/t orthostatic hypotension.    Reports peripheral neuropathy, which is "about the same." Upon further discussion, she is dropping things more often.  She cannot do buttons on shirts.  Peripheral neuropathy may actually be worse than previous (despite previous Vincristine dose-reduction).   Overall, she feels well enough for next cycle of chemo today. She is here today with her daughter.     REVIEW OF SYSTEMS:  Review of Systems  Constitutional: Positive for appetite change, chills and fatigue. Negative for diaphoresis and fever.  HENT:  Negative.  Negative for lump/mass and nosebleeds.   Eyes: Negative.   Respiratory: Negative.  Negative for cough and shortness of breath.   Cardiovascular: Positive for leg swelling. Negative for chest pain.  Gastrointestinal: Positive for constipation. Negative for abdominal pain, blood in stool, diarrhea, nausea and vomiting.  Endocrine: Negative.   Genitourinary: Negative.  Negative for dysuria and hematuria.   Musculoskeletal: Negative.  Negative for arthralgias.  Skin: Negative.  Negative for rash.  Neurological: Positive for dizziness and numbness. Negative for headaches.  Hematological: Negative.  Negative for adenopathy. Does not bruise/bleed easily.  Psychiatric/Behavioral: Negative.  Negative for depression and sleep disturbance. The patient is not nervous/anxious.      PAST MEDICAL/SURGICAL HISTORY:  Past Medical History:  Diagnosis Date  . Arthritis of knee    bilateral    . Chronic renal insufficiency   . Diabetes mellitus, type 2 ([place])   . GERD  (gastroesophageal reflux disease)   . History of kidney stones   . Hypertension   . Iron deficiency anemia 12/28/2016  . Lymphadenopathy, abdominal 12/28/2016  . Osteopenia   . PAD (peripheral artery disease) ([place])   . Personal history of colonic polyps 3 years ago    colonoscopy    Past Surgical History:  Procedure Laterality Date  . BIOPSY  12/16/2016   Procedure: BIOPSY;  Surgeon: Rogene Houston, MD;  [facility];  Service: Endoscopy;;  gastric polyp  . bunionectomy bilateral  feet    . COLONOSCOPY N/A 12/16/2016   Procedure: COLONOSCOPY;  Surgeon: Rogene Houston, MD;  [facility];  Service: Endoscopy;  Laterality: N/A;  2:15  . DENTAL SURGERY    . ESOPHAGOGASTRODUODENOSCOPY N/A 12/16/2016   Procedure: ESOPHAGOGASTRODUODENOSCOPY (EGD);  Surgeon: Rogene Houston, MD;  [facility];  Service: Endoscopy;  Laterality: N/A;  . gum disease  2010   treated by Dr. Andree Elk   . LAPAROTOMY N/A 01/31/2017   Procedure: EXPLORATORY LAPAROTOMY;  Surgeon: Aviva Signs, MD;  Location: [facility];  Service: General;  Laterality: N/A;  . Left knee manipulation  under anasthesia due to contracture -  03/08/08   Dr. Aline Brochure   . left knee replacement  08/14/08   Dr. Aline Brochure   .  LYMPH NODE BIOPSY  01/31/2017   Procedure: LYMPH NODE BIOPSY;  Surgeon: Aviva Signs, MD;  Location: [facility];  Service: General;;  . PORTACATH PLACEMENT Left 02/21/2017   Procedure: INSERTION PORT-A-CATH;  Surgeon: Aviva Signs, MD;  Location: [facility];  Service: General;  Laterality: Left;  . TOTAL ABDOMINAL HYSTERECTOMY     bleeding   . TOTAL KNEE ARTHROPLASTY Right 05/12/2016   Procedure: TOTAL KNEE ARTHROPLASTY;  Surgeon: [name], MD;  Location: [facility];  Service: Orthopedics;  Laterality: Right;  . WISDOM TOOTH EXTRACTION       SOCIAL HISTORY:  Social History   Social History  . Marital status: Widowed    Spouse name: N/A  . Number of children: 3  . Years of education: college     Occupational History  . retired Chemical engineer  Retired   Social History Main Topics  . Smoking status: Former Smoker    Packs/day: 0.25    Years: 30.00    Types: Cigarettes    Quit date: 10/19/1999  . Smokeless tobacco: Never Used  . Alcohol use No  . Drug use: No  . Sexual activity: Not Currently   Other Topics Concern  . Not on file   Social History Narrative  . No narrative on file    FAMILY HISTORY:  Family History  Problem Relation Age of Onset  . Colon cancer Father   . Cancer Mother   . Stroke Mother   . Hypertension Brother   . Dementia Sister   . Multiple sclerosis Daughter   . Pseudotumor cerebri Daughter   . Neuropathy Daughter     CURRENT MEDICATIONS:  Outpatient Encounter Prescriptions as of 05/24/2017  Medication Sig Note  . acetaminophen (TYLENOL) 325 MG tablet Take 650 mg by mouth every 4 (four) hours as needed for mild pain.    Marland Kitchen acyclovir (ZOVIRAX) 400 MG tablet Take 1 tablet (400 mg total) by mouth 2 (two) times daily.   Marland Kitchen allopurinol (ZYLOPRIM) 300 MG tablet Take 1 tablet (300 mg total) by mouth daily.   Marland Kitchen aspirin EC 81 MG tablet Take 81 mg by mouth daily.   . Calcium Carb-Cholecalciferol (CALCIUM 600+D) 600-800 MG-UNIT TABS Take 1 tablet by mouth 2 (two) times daily.   . CYCLOPHOSPHAMIDE IV Inject into the vein. Every 3 weeks 02/11/2017: Not started yet  . diphenhydrAMINE (BENADRYL) 50 MG tablet Take 50 mg by mouth at bedtime as needed for itching.   Marland Kitchen DOXOrubicin HCl (ADRIAMYCIN IV) Inject into the vein. Every 3 weeks 02/11/2017: Not started yet  . lidocaine-prilocaine (EMLA) cream Apply to affected area once 02/11/2017: Not started yet  . lisinopril (PRINIVIL,ZESTRIL) 10 MG tablet Take 1 tablet (10 mg total) by mouth daily.   Marland Kitchen lisinopril-hydrochlorothiazide (PRINZIDE,ZESTORETIC) 20-12.5 MG tablet TAKE ONE TABLET BY MOUTH ONCE DAILY   . multivitamin (THERAGRAN) per tablet Take 1 tablet by mouth daily.   01/24/2017: Time varies  . ondansetron  (ZOFRAN) 8 MG tablet Take 1 tablet (8 mg total) by mouth 2 (two) times daily as needed for refractory nausea / vomiting. Start on day 3 after cyclophosphamide chemotherapy. 02/11/2017: Not started yet  . pantoprazole (PROTONIX) 40 MG tablet Take 1 tablet (40 mg total) by mouth daily before breakfast.   . Pediatric Multivitamins-Iron (FLINTSTONES PLUS IRON) chewable tablet Chew 1 tablet by mouth 2 (two) times daily.   . Pegfilgrastim (NEULASTA ONPRO [place]) Inject into the skin. Every 3 weeks 02/11/2017: Not started yet  . polyethylene glycol (MIRALAX /  GLYCOLAX) packet Take 17 g by mouth daily as needed for mild constipation.   . predniSONE (DELTASONE) 20 MG tablet Take 3.5 tabs (70 mg) by mouth in the morning with breakfast on Days 1-5 of chemotherapy. 02/11/2017: Not started yet  . prochlorperazine (COMPAZINE) 10 MG tablet Take 1 tablet (10 mg total) by mouth every 6 (six) hours as needed (Nausea or vomiting). 02/11/2017: Not started yet  . RiTUXimab (RITUXAN IV) Inject into the vein. Every 3 weeks 02/11/2017: Not started yet   . traMADol (ULTRAM) 50 MG tablet Take 1 tablet (50 mg total) by mouth every 6 (six) hours as needed for moderate pain.   . vinCRIStine 2 mg in sodium chloride 0.9 % 50 mL Inject into the vein once. Every 3 weeks 02/11/2017: Not started yet   No facility-administered encounter medications on file as of 05/24/2017.     ALLERGIES:  Allergies  Allergen Reactions  . Codeine Hives  . Fosamax [Alendronate Sodium] Other (See Comments)    Pt reports excessive urination on the day she took the pill     PHYSICAL EXAM:  ECOG Performance status: 2 - Symptomatic; requires occasional assistance       Physical Exam  Constitutional: She is oriented to person, place, and time.  Chronically-ill appearing female in no acute distress -Seen in chemo bed in infusion area   HENT:  Head: Normocephalic.  Mouth/Throat: Oropharynx is clear and moist.  Eyes: Conjunctivae are normal. No scleral  icterus.  Neck: Normal range of motion. Neck supple.  Cardiovascular: Regular rhythm.   Tachycardic  Pulmonary/Chest: Effort normal. No respiratory distress. She has no wheezes.  Diminished breath sounds bilat bases   Abdominal: Soft. Bowel sounds are normal. There is no tenderness.  Musculoskeletal: Normal range of motion. She exhibits edema (Trace BLE edema (left worse than right) ).  Lymphadenopathy:    She has no cervical adenopathy.  Neurological: She is alert and oriented to person, place, and time. No cranial nerve deficit.  Skin: Skin is warm and dry. No rash noted.  Psychiatric: Mood, memory, affect and judgment normal.  Nursing note and vitals reviewed.    LABORATORY DATA:  I have reviewed the labs as listed.  CBC    Component Value Date/Time   WBC 3.8 (L) 05/24/2017 0943   RBC 2.77 (L) 05/24/2017 0943   HGB 9.1 (L) 05/24/2017 0943   HCT 28.6 (L) 05/24/2017 0943   PLT 287 05/24/2017 0943   MCV 103.2 (H) 05/24/2017 0943   MCH 32.9 05/24/2017 0943   MCHC 31.8 05/24/2017 0943   RDW 18.5 (H) 05/24/2017 0943   LYMPHSABS 0.6 (L) 05/24/2017 0943   MONOABS 0.9 05/24/2017 0943   EOSABS 0.0 05/24/2017 0943   BASOSABS 0.0 05/24/2017 0943   CMP Latest Ref Rng & Units 05/24/2017 05/03/2017 04/12/2017  Glucose 65 - 99 mg/dL 116(H) 127(H) 102(H)  BUN 6 - 20 mg/dL 29(H) 26(H) 25(H)  Creatinine 0.44 - 1.00 mg/dL 0.93 1.02(H) 1.03(H)  Sodium 135 - 145 mmol/L 142 141 140  Potassium 3.5 - 5.1 mmol/L 3.5 3.6 3.7  Chloride 101 - 111 mmol/L 107 103 104  CO2 22 - 32 mmol/L _0 Calcium 8.9 - 10.3 mg/dL 9.1 9.1 8.9  Total Protein 6.5 - 8.1 g/dL 5.8(L) 5.8(L) 5.8(L)  Total Bilirubin 0.3 - 1.2 mg/dL 0.5 0.5 0.4  Alkaline Phos 38 - 126 U/L 59 57 56  AST 15 - 41 U/L _1 ALT 14 - 54 U/L 13(L)  14 11(L)    PENDING LABS:    DIAGNOSTIC IMAGING:  PET scan: 04/29/17 CLINICAL DATA:  Subsequent treatment strategy for diffuse large B-cell lymphoma.  EXAM: NUCLEAR MEDICINE PET  SKULL BASE TO THIGH  TECHNIQUE: 6.7 mCi F-18 FDG was injected intravenously. Full-ring PET imaging was performed from the skull base to thigh after the radiotracer. CT data was obtained and used for attenuation correction and anatomic localization.  FASTING BLOOD GLUCOSE:  Value: 94 mg/dl  COMPARISON:  12/30/2016  FINDINGS: NECK  No hypermetabolic lymph nodes in the neck.  CHEST  The previous pleural activity along the left posteromedial hemithoracic pleural margin has resolved. There is no residual abnormal thoracic activity.  Left Port-A-Cath tip: SVC. Cardiomegaly is present along with atherosclerotic calcification of the aortic arch and descending thoracic aorta. Low-density blood pool suggests anemia. Mild atelectasis or scarring in the lung bases.  ABDOMEN/PELVIS  Marked improvement in the activity and considerable improvement in the size of the bulky retroperitoneal adenopathy. A left periaortic lymph node previously measuring 8.4 cm in short axis currently measures 3.4 cm in short axis on image 116/4, with maximum SUV 3.7 (formerly 15.0). The remaining bulky retroperitoneal adenopathy is likewise dramatically improved both in size and metabolic activity. For reference purposes, background mediastinal blood pool activity level is SUV 2.0 and hepatic background activity level is 3.0. Reference left external iliac node is indistinctly marginated and measures 0.9 cm in short axis on image 146/4 (formerly 1.8 cm) with maximum SUV 2.0 (formerly 6.7).  Aortoiliac atherosclerotic vascular disease. The left hydronephrosis has resolved although there may be some mild wall thickening in the left renal collecting system. Small hyperdense left renal lesions are probably complex cysts but remain nonspecific. Exophytic right renal cyst noted.  Prominent stool throughout the colon favors constipation.  SKELETON  No focal hypermetabolic activity to suggest  skeletal metastasis.  Degenerative arthropathy of both hips. Large bone island in the left proximal femur. Lumbar spondylosis and degenerative disc disease. Degenerative bilateral glenohumeral arthropathy. No significant abnormal skeletal hypermetabolic activity.  IMPRESSION: 1. Overall marked improvement with prominent reduction in size and activity of the retroperitoneal and pelvic adenopathy. The dominant prior periaortic node has reduced in size from 8.4 cm in short axis to 3.4 cm, and from an SUV of 15.02 current SUV of 3.7. This current SUV qualifies as Deauville 4 disease. 2. Prior focus of pleural activity in the left hemithorax has resolved. 3. The hydronephrosis in the left kidney has resolved although there could still be some mild residual collecting system wall thickening. 4. Other imaging findings of potential clinical significance: Aortic Atherosclerosis (ICD10-I70.0). Cardiomegaly. Prominent stool throughout the colon favors constipation. Small complex left renal cysts. Degenerative arthropathy of both hips. Lumbar spondylosis and degenerative disc disease.   Electronically Signed   By: Van Clines M.D.   On: 04/29/2017 12:38     ECHO: 02/14/17     PATHOLOGY:  Lymph node biopsy: 01/31/17    Cytology: 01/31/17     Bone marrow biopsy: 03/01/17           ASSESSMENT & PLAN:   Stage II Diffuse Large B-cell Lymphoma (DLBCL):  -Recent exploratory laparotomy with Dr. Arnoldo Morale confirms suspected lymphoma, specifically diffuse large B-cell lymphoma. Began treatment with R-CHOP chemotherapy on 03/01/17.  Vincristine required dose-reduction from cycle #2 going forward for progressive peripheral neuropathy.   -Most recent restaging PET scan on 04/29/17 revealed great response to treatment thus far with prominent reduction in size and activity of retroperitoneal/pelvic  adenopathy.  -Due for cycle #5 R-CHOP today; labs reviewed and are adequate for  treatment today.  Discussed with Dr. Talbert Cage. Given continued/progressive peripheral neuropathy, will discontinue Vincristine for this and next cycle of chemo; nursing made aware as well.  -Restaging PET scan will be due after cycle #6 (in about 4-6 weeks or so); orders placed today.  -Return to cancer center in 3 weeks for consideration of cycle #6 chemotherapy and follow-up.   Peripheral neuropathy:  -Grade 2-3 and progressive. She is having trouble with buttons and putting on jewelry; she is frequently dropping things.  -Discussed with Dr. Talbert Cage. Will discontinue the dose-reduced Vincristine from this and future cycles of chemotherapy.   At risk for tumor lysis syndrome:  -Given bulky disease, she was at considerable risk of tumor lysis syndrome. This risk decreases with subsequent cycles of chemotherapy.  -Continue prophylactic Allopurinol.   Viral prophylaxis:  -Continue Acyclovir as prescribed.        Dispo:  -Return to cancer center in 3 weeks for follow-up and cycle #6 R-CHOP chemotherapy.  -Restaging PET scan due after cycle #6 (about 4-6 weeks). Orders placed today.      All questions were answered to patient's stated satisfaction. Encouraged patient to call with any new concerns or questions before her next visit to the cancer center and we can certain see her sooner, if needed.     Plan of care discussed with Dr. Talbert Cage, who agrees with the above aforementioned.    Orders placed this encounter:  Orders Placed This Encounter  Procedures  . NM PET Image Restag (PS) Skull Base To Thigh      Mike Craze, NP [place] [phone]

## 2017-05-24 NOTE — Patient Instructions (Signed)
[facility] Discharge Instructions  RECOMMENDATIONS MADE BY THE CONSULTANT AND ANY TEST RESULTS WILL BE SENT TO YOUR REFERRING PHYSICIAN.  You were seen today by Gretchen Dawson NP.   Thank you for choosing [facility] to provide your oncology and hematology care.  To afford each patient quality time with our provider, please arrive at least 15 minutes before your scheduled appointment time.    If you have a lab appointment with the Cancer Center please come in thru the  Main Entrance and check in at the main information desk  You need to re-schedule your appointment should you arrive 10 or more minutes late.  We strive to give you quality time with our providers, and arriving late affects you and other patients whose appointments are after yours.  Also, if you no show three or more times for appointments you may be dismissed from the clinic at the providers discretion.     Again, thank you for choosing [facility].  Our hope is that these requests will decrease the amount of time that you wait before being seen by our physicians.       _____________________________________________________________  Should you have questions after your visit to [facility], please contact our office at [phone] between the hours of 8:30 a.m. and 4:30 p.m.  Voicemails left after 4:30 p.m. will not be returned until the following business day.  For prescription refill requests, have your pharmacy contact our office.       Resources For Cancer Patients and their Caregivers ? American Cancer Society: Can assist with transportation, wigs, general needs, runs Look Good Feel Better.        1-888-227-6333 ? Cancer Care: Provides financial assistance, online support groups, medication/co-pay assistance.  1-800-813-HOPE (4673) ? [facility] Assists [place] cancer patients and  their families through emotional , educational and financial support.  336-427-4357 ? [place] DSS Where to apply for food stamps, Medicaid and utility assistance. 336-342-1394 ? [facility]: Transportation to medical appointments. 336-347-2287 ? Social Security Administration: May apply for disability if have a Stage IV cancer. 336-342-7796 1-800-772-1213 ? [place] Aging, Disability and Transit Services: Assists with nutrition, care and transit needs. 336-349-2343  Cancer Center Support Programs: @10RELATIVEDAYS@ > Cancer Support Group  2nd Tuesday of the month 1pm-2pm, Journey Room  > Creative Journey  3rd Tuesday of the month 1130am-1pm, Journey Room  > Look Good Feel Better  1st Wednesday of the month 10am-12 noon, Journey Room (Call American Cancer Society to register 1-800-395-5775)    

## 2017-05-24 NOTE — Patient Instructions (Signed)
[facility] Discharge Instructions for Patients Receiving Chemotherapy  If you have a lab appointment with the [facility] please come in thru the  Main Entrance and check in at the main information desk   Today you received the following chemotherapy agents: Doxorubicin, Cytoxan, Rituxan You will have Neulasta on pro device placed. You will need to remove it at the time discussed tomorrow evening.  Please call the cancer clinic should you have any trouble with the device.  To help prevent nausea and vomiting after your treatment, we encourage you to take your nausea medication as prescribed.   If you develop nausea and vomiting, or diarrhea that is not controlled by your medication, call the clinic.  The clinic phone number is [phone]. Office hours are Monday-Friday 8:30am-5:00pm.  BELOW ARE SYMPTOMS THAT SHOULD BE REPORTED IMMEDIATELY:  *FEVER GREATER THAN 101.0 F  *CHILLS WITH OR WITHOUT FEVER  NAUSEA AND VOMITING THAT IS NOT CONTROLLED WITH YOUR NAUSEA MEDICATION  *UNUSUAL SHORTNESS OF BREATH  *UNUSUAL BRUISING OR BLEEDING  TENDERNESS IN MOUTH AND THROAT WITH OR WITHOUT PRESENCE OF ULCERS  *URINARY PROBLEMS  *BOWEL PROBLEMS  UNUSUAL RASH Items with * indicate a potential emergency and should be followed up as soon as possible. If you have an emergency after office hours please contact your primary care physician or go to the nearest emergency department.  Please call the clinic during office hours if you have any questions or concerns.   You may also contact the Patient Navigator at [phone] should you have any questions or need assistance in obtaining follow up care.      Resources For Cancer Patients and their Caregivers ? American Cancer Society: Can assist with transportation, wigs, general needs, runs Look Good Feel Better.        919-282-7083 ? Cancer Care: Provides financial assistance, online support groups,  medication/co-pay assistance.  1-800-813-HOPE 9022578540) ? [facility] Assists [place] cancer patients and their families through emotional , educational and financial support.  469-024-9735 ? [place] DSS Where to apply for food stamps, Medicaid and utility assistance. (506)487-0175 ? [employer]: Transportation to medical appointments. (814) 236-8994 ? Social Security Administration: May apply for disability if have a Stage IV cancer. 406-557-4249 8147587012 ? [employer], Disability and Transit Services: Assists with nutrition, care and transit needs. [phone]

## 2017-05-24 NOTE — Progress Notes (Signed)
Due to patient's continued neuropathy provider is holding Vincristine dose for this cycle and next cycle.   Patient discharged with Neulasta Onpro device chamber full and green light flashing indicating working. Patient aware of time to remove device. Patient verbalizes understanding and instructed to call clinic should she have any questions.  Patient tolerated infusion today without incidence. Patient discharged in stable condition via wheelchair to family. Patient to follow up as scheduled.

## 2017-05-26 ENCOUNTER — Encounter: Payer: Self-pay | Admitting: *Deleted

## 2017-06-01 ENCOUNTER — Telehealth: Payer: Self-pay | Admitting: Family Medicine

## 2017-06-01 NOTE — Telephone Encounter (Signed)
[name], physical therapist with [facility], called and left message on nurse line regarding patient. She states she was originally set up to discharge patient next week, but after this past chemo, she has had a functional decline and Amy would like verbal orders to continue therapy for twice a week for 3 weeks starting next week to get her through her final chemo.  Amy # [phone]

## 2017-06-01 NOTE — Telephone Encounter (Signed)
pls leave verbal order requested on line

## 2017-06-02 ENCOUNTER — Telehealth: Payer: Self-pay | Admitting: Family Medicine

## 2017-06-02 NOTE — Telephone Encounter (Signed)
Called patient regarding message below. No answer, unable to leave message. Phone keeps beeping.

## 2017-06-02 NOTE — Telephone Encounter (Signed)
Patient would like to know what amount of Tylenol she should be taking for her swelling in the feet.  Please advise.

## 2017-06-02 NOTE — Telephone Encounter (Signed)
Sharyn Lull, occupational therapist with [facility], called and left message on nurse line regarding patient. She states she was working with patient today and she wanted to let Dr. Moshe Cipro know that patient has increased edema bilaterally, with the LLE being the worst. She states they are hard and shiny. She states she called nursing [facility] and was advised to call PCP. She asks if we can give nursing [facility] orders to be evaluated. Also, she states that patient is complaining of increased difficulty swallowing liquids. She requests speech therapy evaluation as well.  Callback# [phone]

## 2017-06-03 NOTE — Telephone Encounter (Signed)
pls call and give verbal order for requested services, [facility] nurse evaluation and evaluation of swallowing I will sign off on written order

## 2017-06-03 NOTE — Telephone Encounter (Signed)
Gave orders to [facility]

## 2017-06-06 ENCOUNTER — Telehealth: Payer: Self-pay

## 2017-06-06 NOTE — Telephone Encounter (Signed)
Amy from [facility] left a message asking to extend home pt 2 times a week for 3 weeks. Left amy a message to call back.

## 2017-06-06 NOTE — Telephone Encounter (Signed)
I spoke directly with the pt advised that for leg swelling she should elevate her legs and wear support hose, ty;lenol not recommended for leg swelling

## 2017-06-09 ENCOUNTER — Other Ambulatory Visit (HOSPITAL_COMMUNITY): Payer: Self-pay | Admitting: Oncology

## 2017-06-14 ENCOUNTER — Encounter (HOSPITAL_BASED_OUTPATIENT_CLINIC_OR_DEPARTMENT_OTHER): Payer: Medicare Other | Admitting: Adult Health

## 2017-06-14 ENCOUNTER — Encounter (HOSPITAL_BASED_OUTPATIENT_CLINIC_OR_DEPARTMENT_OTHER): Payer: Medicare Other

## 2017-06-14 VITALS — BP 105/50 | HR 83 | Temp 98.7°F | Resp 17 | Wt 147.3 lb

## 2017-06-14 DIAGNOSIS — Z5111 Encounter for antineoplastic chemotherapy: Secondary | ICD-10-CM | POA: Diagnosis not present

## 2017-06-14 DIAGNOSIS — C8330 Diffuse large B-cell lymphoma, unspecified site: Secondary | ICD-10-CM

## 2017-06-14 DIAGNOSIS — G620 Drug-induced polyneuropathy: Secondary | ICD-10-CM

## 2017-06-14 DIAGNOSIS — Z5112 Encounter for antineoplastic immunotherapy: Secondary | ICD-10-CM

## 2017-06-14 DIAGNOSIS — D500 Iron deficiency anemia secondary to blood loss (chronic): Secondary | ICD-10-CM | POA: Diagnosis not present

## 2017-06-14 LAB — COMPREHENSIVE METABOLIC PANEL
ALT: 13 U/L — ABNORMAL LOW (ref 14–54)
AST: 24 U/L (ref 15–41)
Albumin: 3.1 g/dL — ABNORMAL LOW (ref 3.5–5.0)
Alkaline Phosphatase: 59 U/L (ref 38–126)
Anion gap: 7 (ref 5–15)
BUN: 18 mg/dL (ref 6–20)
CHLORIDE: 104 mmol/L (ref 101–111)
CO2: 29 mmol/L (ref 22–32)
Calcium: 9 mg/dL (ref 8.9–10.3)
Creatinine, Ser: 0.94 mg/dL (ref 0.44–1.00)
GFR, EST NON AFRICAN AMERICAN: 56 mL/min — AB (ref >60)
Glucose, Bld: 142 mg/dL — ABNORMAL HIGH (ref 65–99)
POTASSIUM: 4 mmol/L (ref 3.5–5.1)
Sodium: 140 mmol/L (ref 135–145)
Total Bilirubin: 0.4 mg/dL (ref 0.3–1.2)
Total Protein: 5.7 g/dL — ABNORMAL LOW (ref 6.5–8.1)

## 2017-06-14 LAB — CBC WITH DIFFERENTIAL/PLATELET
Basophils Absolute: 0 10*3/uL (ref 0.0–0.1)
Basophils Relative: 1 %
EOS PCT: 1 %
Eosinophils Absolute: 0 10*3/uL (ref 0.0–0.7)
HCT: 28 % — ABNORMAL LOW (ref 36.0–46.0)
Hemoglobin: 9.1 g/dL — ABNORMAL LOW (ref 12.0–15.0)
LYMPHS ABS: 0.8 10*3/uL (ref 0.7–4.0)
LYMPHS PCT: 20 %
MCH: 34.1 pg — ABNORMAL HIGH (ref 26.0–34.0)
MCHC: 32.5 g/dL (ref 30.0–36.0)
MCV: 104.9 fL — AB (ref 78.0–100.0)
MONO ABS: 0.6 10*3/uL (ref 0.1–1.0)
Monocytes Relative: 16 %
Neutro Abs: 2.4 10*3/uL (ref 1.7–7.7)
Neutrophils Relative %: 62 %
PLATELETS: 272 10*3/uL (ref 150–400)
RBC: 2.67 MIL/uL — ABNORMAL LOW (ref 3.87–5.11)
RDW: 17.7 % — AB (ref 11.5–15.5)
WBC: 3.8 10*3/uL — ABNORMAL LOW (ref 4.0–10.5)

## 2017-06-14 LAB — URIC ACID: URIC ACID, SERUM: 2.6 mg/dL (ref 2.3–6.6)

## 2017-06-14 LAB — PHOSPHORUS: Phosphorus: 3.2 mg/dL (ref 2.5–4.6)

## 2017-06-14 LAB — LACTATE DEHYDROGENASE: LDH: 173 U/L (ref 98–192)

## 2017-06-14 MED ORDER — PALONOSETRON HCL INJECTION 0.25 MG/5ML
0.2500 mg | Freq: Once | INTRAVENOUS | Status: AC
Start: 2017-06-14 — End: 2017-06-14
  Administered 2017-06-14: 0.25 mg via INTRAVENOUS

## 2017-06-14 MED ORDER — ACETAMINOPHEN 325 MG PO TABS
650.0000 mg | ORAL_TABLET | Freq: Once | ORAL | Status: AC
  Administered 2017-06-14: 650 mg via ORAL

## 2017-06-14 MED ORDER — DEXAMETHASONE SODIUM PHOSPHATE 10 MG/ML IJ SOLN
INTRAMUSCULAR | Status: AC
Start: 2017-06-14 — End: ?
  Filled 2017-06-14: qty 1

## 2017-06-14 MED ORDER — PALONOSETRON HCL INJECTION 0.25 MG/5ML
INTRAVENOUS | Status: AC
  Filled 2017-06-14: qty 5

## 2017-06-14 MED ORDER — PEGFILGRASTIM 6 MG/0.6ML ~~LOC~~ PSKT
6.0000 mg | PREFILLED_SYRINGE | Freq: Once | SUBCUTANEOUS | Status: AC
  Administered 2017-06-14: 6 mg via SUBCUTANEOUS

## 2017-06-14 MED ORDER — DEXAMETHASONE SODIUM PHOSPHATE 10 MG/ML IJ SOLN
10.0000 mg | Freq: Once | INTRAMUSCULAR | Status: AC
  Administered 2017-06-14: 10 mg via INTRAVENOUS

## 2017-06-14 MED ORDER — HEPARIN SOD (PORK) LOCK FLUSH 100 UNIT/ML IV SOLN
INTRAVENOUS | Status: AC
  Filled 2017-06-14: qty 5

## 2017-06-14 MED ORDER — ACETAMINOPHEN 325 MG PO TABS
ORAL_TABLET | ORAL | Status: AC
  Filled 2017-06-14: qty 2

## 2017-06-14 MED ORDER — SODIUM CHLORIDE 0.9 % IV SOLN
375.0000 mg/m2 | Freq: Once | INTRAVENOUS | Status: AC
  Administered 2017-06-14: 600 mg via INTRAVENOUS
  Filled 2017-06-14: qty 50

## 2017-06-14 MED ORDER — DIPHENHYDRAMINE HCL 25 MG PO CAPS
50.0000 mg | ORAL_CAPSULE | Freq: Once | ORAL | Status: AC
  Administered 2017-06-14: 50 mg via ORAL

## 2017-06-14 MED ORDER — SODIUM CHLORIDE 0.9 % IV SOLN
750.0000 mg/m2 | Freq: Once | INTRAVENOUS | Status: AC
  Administered 2017-06-14: 1260 mg via INTRAVENOUS
  Filled 2017-06-14: qty 13

## 2017-06-14 MED ORDER — SODIUM CHLORIDE 0.9 % IV SOLN
Freq: Once | INTRAVENOUS | Status: AC
  Administered 2017-06-14: 11:00:00 via INTRAVENOUS

## 2017-06-14 MED ORDER — DEXAMETHASONE SODIUM PHOSPHATE 100 MG/10ML IJ SOLN: 10.0000 mg | Freq: Once | INTRAMUSCULAR | Status: DC

## 2017-06-14 MED ORDER — PEGFILGRASTIM 6 MG/0.6ML ~~LOC~~ PSKT
PREFILLED_SYRINGE | SUBCUTANEOUS | Status: AC
Start: 2017-06-14 — End: ?
  Filled 2017-06-14: qty 0.6

## 2017-06-14 MED ORDER — DIPHENHYDRAMINE HCL 25 MG PO CAPS
ORAL_CAPSULE | ORAL | Status: AC
Start: 2017-06-14 — End: ?
  Filled 2017-06-14: qty 2

## 2017-06-14 MED ORDER — HEPARIN SOD (PORK) LOCK FLUSH 100 UNIT/ML IV SOLN
500.0000 [IU] | Freq: Once | INTRAVENOUS | Status: AC | PRN
  Administered 2017-06-14: 500 [IU]

## 2017-06-14 MED ORDER — DOXORUBICIN HCL CHEMO IV INJECTION 2 MG/ML
50.0000 mg/m2 | Freq: Once | INTRAVENOUS | Status: AC
  Administered 2017-06-14: 84 mg via INTRAVENOUS
  Filled 2017-06-14: qty 42

## 2017-06-14 MED ORDER — SODIUM CHLORIDE 0.9% FLUSH
10.0000 mL | INTRAVENOUS | Status: DC | PRN
  Administered 2017-06-14: 10 mL
  Filled 2017-06-14: qty 10

## 2017-06-14 NOTE — Patient Instructions (Addendum)
[facility] Discharge Instructions for Patients Receiving Chemotherapy   If you have a lab appointment with the [facility] please come in thru the  Main Entrance and check in at the main information desk   Today you received the following chemotherapy agents Rituxan,Adriamycin and Cytoxan as well  as Neulasta on-pro.  You can safely remove the Neulasta On-pro device at 7:30 PM tonight.  Follow-up as scheduled. Call clinic for any questions or concerns.  To help prevent nausea and vomiting after your treatment, we encourage you to take your nausea medication   If you develop nausea and vomiting, or diarrhea that is not controlled by your medication, call the clinic.  The clinic phone number is [phone]. Office hours are Monday-Friday 8:30am-5:00pm.  BELOW ARE SYMPTOMS THAT SHOULD BE REPORTED IMMEDIATELY:  *FEVER GREATER THAN 101.0 F  *CHILLS WITH OR WITHOUT FEVER  NAUSEA AND VOMITING THAT IS NOT CONTROLLED WITH YOUR NAUSEA MEDICATION  *UNUSUAL SHORTNESS OF BREATH  *UNUSUAL BRUISING OR BLEEDING  TENDERNESS IN MOUTH AND THROAT WITH OR WITHOUT PRESENCE OF ULCERS  *URINARY PROBLEMS  *BOWEL PROBLEMS  UNUSUAL RASH Items with * indicate a potential emergency and should be followed up as soon as possible. If you have an emergency after office hours please contact your primary care physician or go to the nearest emergency department.  Please call the clinic during office hours if you have any questions or concerns.   You may also contact the Patient Navigator at [phone] should you have any questions or need assistance in obtaining follow up care.      Resources For Cancer Patients and their Caregivers ? American Cancer Society: Can assist with transportation, wigs, general needs, runs Look Good Feel Better.        (385)189-4313 ? Cancer Care: Provides financial assistance, online support groups, medication/co-pay assistance.  1-800-813-HOPE  (450)398-9554) ? [place] Assists [place] cancer patients and their families through emotional , educational and financial support.  (904) 872-7266 ? [place] DSS Where to apply for food stamps, Medicaid and utility assistance. 720 097 1475 ? [employer]: Transportation to medical appointments. [phone] ? Social Security Administration: May apply for disability if have a Stage IV cancer. 662-727-3919 860-231-2012 ? [employer], Disability and Transit Services: Assists with nutrition, care and transit needs. [phone]

## 2017-06-14 NOTE — Progress Notes (Signed)
Patient tolerated treatment today without incidence. Patient discharged in stable condition via wheelchair with daughter. Patient to follow up as scheduled.

## 2017-06-14 NOTE — Progress Notes (Signed)
[name], [name] [id number]   CLINIC:  Medical Oncology/Hematology  PCP:  Fayrene Helper, MD [address], [address] [name] [address] [phone]   REASON FOR VISIT:  Follow-up for Diffuse Large B-cell Lymphoma (DLBCL):   CURRENT THERAPY: R-CHOP chemotherapy, beginning 03/01/17   BRIEF ONCOLOGIC HISTORY:    Diffuse large B cell lymphoma ([place])   12/20/2016 Imaging    CT abd/pelvis: IMPRESSION: 1. Extensive abdominal, pelvic and retrocrural lymphadenopathy, most pronounced in the retroperitoneum, as detailed above. Findings are highly suspicious for lymphoproliferative disorder and further evaluation is strongly recommended at this time. 2. Colonic diverticulosis without evidence of acute diverticulitis at this time. 3. Aortic atherosclerosis. 4. Additional incidental findings, as above.      12/30/2016 PET scan    IMPRESSION: 1. Extensive hypermetabolic abdominal and pelvic adenopathy, including bulky retroperitoneal adenopathy lifting of the abdominal aorta away from the spine. One of the larger lymph nodes measures 8.4 cm in short axis with maximum SUV 25.0. 2. Small pleural focus of tumor in the left posteromedial chest, maximum SUV only 4.2. This represents the only thoracic involvement. 3. Other imaging findings of potential clinical significance: Atherosclerotic aortic arch. Mild cardiomegaly. Mild atelectasis in both lower lobes. Hyperdense left renal cysts as on prior CT. Mild left hydronephrosis likely due to extrinsic mass effect on the left ureter. Right greater than left degenerative arthropathy of the hips.      01/31/2017 Surgery    Exploratory lap [name])       01/31/2017 Pathology Results    Lymph node biospy: DLBCL, high-grade, monoclonal kappa restricted.       02/07/2017 Initial Diagnosis    Diffuse large B cell lymphoma ([place])     02/14/2017 Echocardiogram    Pre-chemo ECHO: EF 55%.       03/01/2017 Bone Marrow Biopsy    No bone marrow involvement by lymphoma. [facility] cytogenetics      03/01/2017 -  Chemotherapy    The patient had DOXOrubicin (ADRIAMYCIN) chemo injection 90 mg, 50 mg/m2 = 90 mg, Intravenous,  Once, 1 of 6 cycles  palonosetron (ALOXI) injection 0.25 mg, 0.25 mg, Intravenous,  Once, 1 of 6 cycles  pegfilgrastim (NEULASTA ONPRO KIT) injection 6 mg, 6 mg, Subcutaneous, Once, 1 of 6 cycles  vinCRIStine (ONCOVIN) 2 mg in sodium chloride 0.9 % 50 mL chemo infusion, 2 mg, Intravenous,  Once, 1 of 6 cycles  riTUXimab (RITUXAN) 700 mg in sodium chloride 0.9 % 250 mL (2.1875 mg/mL) chemo infusion, 375 mg/m2 = 700 mg, Intravenous,  Once, 1 of 6 cycles Dose modification: 375 mg/m2 (original dose 375 mg/m2, Cycle 1)  cyclophosphamide (CYTOXAN) 1,340 mg in sodium chloride 0.9 % 250 mL chemo infusion, 750 mg/m2 = 1,340 mg, Intravenous,  Once, 1 of 6 cycles  for chemotherapy treatment.        04/29/2017 PET scan    1. Overall marked improvement with prominent reduction in size and activity of the retroperitoneal and pelvic adenopathy. The dominant prior periaortic node has reduced in size from 8.4 cm in short axis to 3.4 cm, and from an SUV of 15.02 current SUV of 3.7. This current SUV qualifies as Deauville 4 disease. 2. Prior focus of pleural activity in the left hemithorax has resolved. 3. The hydronephrosis in the left kidney has resolved although there could still be some mild residual collecting system wall thickening. 4. Other imaging findings of potential clinical significance: Aortic Atherosclerosis (ICD10-I70.0). Cardiomegaly.  Prominent stool throughout the colon favors constipation. Small complex left renal cysts. Degenerative arthropathy of both hips. Lumbar spondylosis and degenerative disc disease.        INTERVAL HISTORY:  [name] 83 y.o. female returns for follow-up for diffuse large B-cell lymphoma.   Due for cycle #6 R-CHOP chemotherapy  today.  She is here today with her daughter.   Appetite 100%; energy levels 50%. Denies any pain.  Overall, she tells me that she is "doing good.'  Her numbness/tingling in her hands and feet have been getting better.  "And the darkness on my hands is getting lighter every day too."    Bowels are moving well. Occasional feet swelling, which has been chronic and stable. Denies fever/chills. Reports occasional night sweats, but thinks it may be related to her warm environment when she sleeps.  She has some clear nasal drainage; otherwise, she is largely without other complaints today.   She feels ready for her next cycle of chemo today.      REVIEW OF SYSTEMS:  Review of Systems  Constitutional: Positive for fatigue. Negative for chills and fever.  HENT:  Negative.  Negative for mouth sores.   Eyes: Negative.   Respiratory: Negative.  Negative for cough and shortness of breath.   Cardiovascular: Positive for leg swelling.  Gastrointestinal: Negative.  Negative for abdominal pain, blood in stool, constipation, diarrhea, nausea and vomiting.  Endocrine: Negative.   Genitourinary: Negative.  Negative for dysuria, hematuria and vaginal bleeding.   Musculoskeletal: Negative.   Skin: Negative.  Negative for rash.  Neurological: Negative.   Hematological: Negative.   Psychiatric/Behavioral: Negative.      PAST MEDICAL/SURGICAL HISTORY:  Past Medical History:  Diagnosis Date  . Arthritis of knee    bilateral    . Chronic renal insufficiency   . Diabetes mellitus, type 2 ([place])   . GERD (gastroesophageal reflux disease)   . History of kidney stones   . Hypertension   . Iron deficiency anemia 12/28/2016  . Lymphadenopathy, abdominal 12/28/2016  . Osteopenia   . PAD (peripheral artery disease) ([place])   . Personal history of colonic polyps 3 years ago    colonoscopy    Past Surgical History:  Procedure Laterality Date  . BIOPSY  12/16/2016   Procedure: BIOPSY;  Surgeon: Rogene Houston,  MD;  [facility];  Service: Endoscopy;;  gastric polyp  . bunionectomy bilateral  feet    . COLONOSCOPY N/A 12/16/2016   Procedure: COLONOSCOPY;  Surgeon: Rogene Houston, MD;  [facility];  Service: Endoscopy;  Laterality: N/A;  2:15  . DENTAL SURGERY    . ESOPHAGOGASTRODUODENOSCOPY N/A 12/16/2016   Procedure: ESOPHAGOGASTRODUODENOSCOPY (EGD);  Surgeon: Rogene Houston, MD;  [facility];  Service: Endoscopy;  Laterality: N/A;  . gum disease  2010   treated by Dr. Andree Elk   . LAPAROTOMY N/A 01/31/2017   Procedure: EXPLORATORY LAPAROTOMY;  Surgeon: Aviva Signs, MD;  Location: [facility];  Service: General;  Laterality: N/A;  . Left knee manipulation  under anasthesia due to contracture -  03/08/08   Dr. Aline Brochure   . left knee replacement  08/14/08   Dr. Aline Brochure   . LYMPH NODE BIOPSY  01/31/2017   Procedure: LYMPH NODE BIOPSY;  Surgeon: Aviva Signs, MD;  Location: [facility];  Service: General;;  . PORTACATH PLACEMENT Left 02/21/2017   Procedure: INSERTION PORT-A-CATH;  Surgeon: Aviva Signs, MD;  Location: [facility];  Service: General;  Laterality: Left;  .  TOTAL ABDOMINAL HYSTERECTOMY     bleeding   . TOTAL KNEE ARTHROPLASTY Right 05/12/2016   Procedure: TOTAL KNEE ARTHROPLASTY;  Surgeon: [name], MD;  Location: [facility];  Service: Orthopedics;  Laterality: Right;  . WISDOM TOOTH EXTRACTION       SOCIAL HISTORY:  Social History   Social History  . Marital status: Widowed    Spouse name: N/A  . Number of children: 3  . Years of education: college    Occupational History  . retired Chemical engineer  Retired   Social History Main Topics  . Smoking status: Former Smoker    Packs/day: 0.25    Years: 30.00    Types: Cigarettes    Quit date: 10/19/1999  . Smokeless tobacco: Never Used  . Alcohol use No  . Drug use: No  . Sexual activity: Not Currently   Other Topics Concern  . Not on file   Social History Narrative  . No narrative on file    FAMILY  HISTORY:  Family History  Problem Relation Age of Onset  . Colon cancer Father   . Cancer Mother   . Stroke Mother   . Hypertension Brother   . Dementia Sister   . Multiple sclerosis Daughter   . Pseudotumor cerebri Daughter   . Neuropathy Daughter     CURRENT MEDICATIONS:  Outpatient Encounter Prescriptions as of 06/14/2017  Medication Sig Note  . acetaminophen (TYLENOL) 325 MG tablet Take 650 mg by mouth every 4 (four) hours as needed for mild pain.    Marland Kitchen acyclovir (ZOVIRAX) 400 MG tablet Take 1 tablet (400 mg total) by mouth 2 (two) times daily.   Marland Kitchen allopurinol (ZYLOPRIM) 300 MG tablet Take 1 tablet (300 mg total) by mouth daily.   Marland Kitchen aspirin EC 81 MG tablet Take 81 mg by mouth daily.   . Calcium Carb-Cholecalciferol (CALCIUM 600+D) 600-800 MG-UNIT TABS Take 1 tablet by mouth 2 (two) times daily.   . diphenhydrAMINE (BENADRYL) 50 MG tablet Take 50 mg by mouth at bedtime as needed for itching.   . lidocaine-prilocaine (EMLA) cream Apply to affected area once 02/11/2017: Not started yet  . lisinopril (PRINIVIL,ZESTRIL) 10 MG tablet Take 1 tablet (10 mg total) by mouth daily.   . ondansetron (ZOFRAN) 8 MG tablet Take 1 tablet (8 mg total) by mouth 2 (two) times daily as needed for refractory nausea / vomiting. Start on day 3 after cyclophosphamide chemotherapy. (Patient not taking: Reported on 06/14/2017) 02/11/2017: Not started yet  . pantoprazole (PROTONIX) 40 MG tablet Take 1 tablet (40 mg total) by mouth daily before breakfast.   . Pediatric Multivitamins-Iron (FLINTSTONES PLUS IRON) chewable tablet Chew 1 tablet by mouth 2 (two) times daily. (Patient taking differently: Chew 1 tablet by mouth daily. )   . polyethylene glycol (MIRALAX / GLYCOLAX) packet Take 17 g by mouth daily as needed for mild constipation.   . predniSONE (DELTASONE) 20 MG tablet Take 3.5 tabs (70 mg) by mouth in the morning with breakfast on Days 1-5 of chemotherapy. 02/11/2017: Not started yet  . prochlorperazine  (COMPAZINE) 10 MG tablet Take 1 tablet (10 mg total) by mouth every 6 (six) hours as needed (Nausea or vomiting). (Patient not taking: Reported on 06/14/2017) 02/11/2017: Not started yet  . traMADol (ULTRAM) 50 MG tablet Take 1 tablet (50 mg total) by mouth every 6 (six) hours as needed for moderate pain.   . [EXPIRED] 0.9 %  sodium chloride infusion    . [EXPIRED] acetaminophen (  TYLENOL) tablet 650 mg    . [EXPIRED] cyclophosphamide (CYTOXAN) 1,260 mg in sodium chloride 0.9 % 250 mL chemo infusion    . [EXPIRED] dexamethasone (DECADRON) injection 10 mg    . [EXPIRED] diphenhydrAMINE (BENADRYL) capsule 50 mg    . [EXPIRED] DOXOrubicin (ADRIAMYCIN) chemo injection 84 mg    . [EXPIRED] heparin lock flush 100 unit/mL    . [EXPIRED] palonosetron (ALOXI) injection 0.25 mg    . [EXPIRED] pegfilgrastim (NEULASTA ONPRO KIT) injection 6 mg    . [EXPIRED] riTUXimab (RITUXAN) 600 mg in sodium chloride 0.9 % 250 mL (1.9355 mg/mL) chemo infusion    . [DISCONTINUED] dexamethasone (DECADRON) 10 mg in sodium chloride 0.9 % 50 mL IVPB    . [DISCONTINUED] sodium chloride flush (NS) 0.9 % injection 10 mL     No facility-administered encounter medications on file as of 06/14/2017.     ALLERGIES:  Allergies  Allergen Reactions  . Codeine Hives  . Fosamax [Alendronate Sodium] Other (See Comments)    Pt reports excessive urination on the day she took the pill     PHYSICAL EXAM:  ECOG Performance status: 2 - Symptomatic; requires occasional assistance       Physical Exam  Constitutional: She is oriented to person, place, and time and well-developed, well-nourished, and in no distress.  Seen in chemo bed in infusion area  -Frail female in no acute distress   HENT:  Head: Normocephalic.  Mouth/Throat: Oropharynx is clear and moist.  Eyes: Conjunctivae are normal. No scleral icterus.  Neck: Normal range of motion. Neck supple.  Cardiovascular: Normal rate and regular rhythm.   Pulmonary/Chest: Effort  normal and breath sounds normal. No respiratory distress.  Abdominal: Soft. Bowel sounds are normal. There is no tenderness.  Musculoskeletal: Normal range of motion. She exhibits edema (Trace ankle edema).  Lymphadenopathy:    She has no cervical adenopathy.       Right: No supraclavicular adenopathy present.       Left: No supraclavicular adenopathy present.  Neurological: She is alert and oriented to person, place, and time. No cranial nerve deficit.  Skin: Skin is warm and dry. No rash noted.  Psychiatric: Mood, memory, affect and judgment normal.  Nursing note and vitals reviewed.    LABORATORY DATA:  I have reviewed the labs as listed.  CBC    Component Value Date/Time   WBC 3.8 (L) 06/14/2017 0939   RBC 2.67 (L) 06/14/2017 0939   HGB 9.1 (L) 06/14/2017 0939   HCT 28.0 (L) 06/14/2017 0939   PLT 272 06/14/2017 0939   MCV 104.9 (H) 06/14/2017 0939   MCH 34.1 (H) 06/14/2017 0939   MCHC 32.5 06/14/2017 0939   RDW 17.7 (H) 06/14/2017 0939   LYMPHSABS 0.8 06/14/2017 0939   MONOABS 0.6 06/14/2017 0939   EOSABS 0.0 06/14/2017 0939   BASOSABS 0.0 06/14/2017 0939   CMP Latest Ref Rng & Units 06/14/2017 05/24/2017 05/03/2017  Glucose 65 - 99 mg/dL 142(H) 116(H) 127(H)  BUN 6 - 20 mg/dL 18 29(H) 26(H)  Creatinine 0.44 - 1.00 mg/dL 0.94 0.93 1.02(H)  Sodium 135 - 145 mmol/L 140 142 141  Potassium 3.5 - 5.1 mmol/L 4.0 3.5 3.6  Chloride 101 - 111 mmol/L 104 107 103  CO2 22 - 32 mmol/L _0 Calcium 8.9 - 10.3 mg/dL 9.0 9.1 9.1  Total Protein 6.5 - 8.1 g/dL 5.7(L) 5.8(L) 5.8(L)  Total Bilirubin 0.3 - 1.2 mg/dL 0.4 0.5 0.5  Alkaline Phos 38 - 126  U/L 59 59 57  AST 15 - 41 U/L _0 ALT 14 - 54 U/L 13(L) 13(L) 14    PENDING LABS:    DIAGNOSTIC IMAGING:  PET scan: 04/29/17 CLINICAL DATA:  Subsequent treatment strategy for diffuse large B-cell lymphoma.  EXAM: NUCLEAR MEDICINE PET SKULL BASE TO THIGH  TECHNIQUE: 6.7 mCi F-18 FDG was injected intravenously.  Full-ring PET imaging was performed from the skull base to thigh after the radiotracer. CT data was obtained and used for attenuation correction and anatomic localization.  FASTING BLOOD GLUCOSE:  Value: 94 mg/dl  COMPARISON:  12/30/2016  FINDINGS: NECK  No hypermetabolic lymph nodes in the neck.  CHEST  The previous pleural activity along the left posteromedial hemithoracic pleural margin has resolved. There is no residual abnormal thoracic activity.  Left Port-A-Cath tip: SVC. Cardiomegaly is present along with atherosclerotic calcification of the aortic arch and descending thoracic aorta. Low-density blood pool suggests anemia. Mild atelectasis or scarring in the lung bases.  ABDOMEN/PELVIS  Marked improvement in the activity and considerable improvement in the size of the bulky retroperitoneal adenopathy. A left periaortic lymph node previously measuring 8.4 cm in short axis currently measures 3.4 cm in short axis on image 116/4, with maximum SUV 3.7 (formerly 15.0). The remaining bulky retroperitoneal adenopathy is likewise dramatically improved both in size and metabolic activity. For reference purposes, background mediastinal blood pool activity level is SUV 2.0 and hepatic background activity level is 3.0. Reference left external iliac node is indistinctly marginated and measures 0.9 cm in short axis on image 146/4 (formerly 1.8 cm) with maximum SUV 2.0 (formerly 6.7).  Aortoiliac atherosclerotic vascular disease. The left hydronephrosis has resolved although there may be some mild wall thickening in the left renal collecting system. Small hyperdense left renal lesions are probably complex cysts but remain nonspecific. Exophytic right renal cyst noted.  Prominent stool throughout the colon favors constipation.  SKELETON  No focal hypermetabolic activity to suggest skeletal metastasis.  Degenerative arthropathy of both hips. Large bone island in  the left proximal femur. Lumbar spondylosis and degenerative disc disease. Degenerative bilateral glenohumeral arthropathy. No significant abnormal skeletal hypermetabolic activity.  IMPRESSION: 1. Overall marked improvement with prominent reduction in size and activity of the retroperitoneal and pelvic adenopathy. The dominant prior periaortic node has reduced in size from 8.4 cm in short axis to 3.4 cm, and from an SUV of 15.02 current SUV of 3.7. This current SUV qualifies as Deauville 4 disease. 2. Prior focus of pleural activity in the left hemithorax has resolved. 3. The hydronephrosis in the left kidney has resolved although there could still be some mild residual collecting system wall thickening. 4. Other imaging findings of potential clinical significance: Aortic Atherosclerosis (ICD10-I70.0). Cardiomegaly. Prominent stool throughout the colon favors constipation. Small complex left renal cysts. Degenerative arthropathy of both hips. Lumbar spondylosis and degenerative disc disease.   Electronically Signed   By: Van Clines M.D.   On: 04/29/2017 12:38     ECHO: 02/14/17     PATHOLOGY:  Lymph node biopsy: 01/31/17    Cytology: 01/31/17     Bone marrow biopsy: 03/01/17           ASSESSMENT & PLAN:   Stage II Diffuse Large B-cell Lymphoma (DLBCL):  -Recent exploratory laparotomy with Dr. Arnoldo Farmer confirms suspected lymphoma, specifically diffuse large B-cell lymphoma. Began treatment with R-CHOP chemotherapy on 03/01/17.  Vincristine required dose-reduction from cycle #2 going forward for progressive peripheral neuropathy.   -Most recent restaging  PET scan on 04/29/17 revealed great response to treatment thus far with prominent reduction in size and activity of retroperitoneal/pelvic adenopathy.  -She experienced progressive peripheral neuropathy and Vincristine was subsequently dose-reduced with cycles #3 & 4; symptoms did not improve, so  Vincristine was discontinued for cycle #5.  -Due for cycle #6 R-CHOP (minus Vincristine) today. Las reviewed and are adequate for treatment.   -Restaging PET scan due after this cycle of therapy. PET scan is scheduled for 06/27/17. We discussed that if there is local residual disease, she may require radiation vs additional chemotherapy.  -Return to cancer center a few days after PET scan to review results with Dr. Talbert Cage and determine additional treatment planning, if needed.   Peripheral neuropathy:  -Improved since discontinuing Vincristine with cycle #5. She does continue to have some numbness/tingling to hands and feet, but improved. Suspect her symptoms will continue to improve in the coming weeks without VIncristine.   At risk for tumor lysis syndrome:  -Continue prophylactic Allopurinol. Can discontinue if she shows great response   Viral prophylaxis:  -Continue Acyclovir as prescribed.      Dispo:  -PET scan scheduled for 06/27/17.  -Return to cancer center a few days after PET scan for follow-up to review results and subsequent treatment planning.     All questions were answered to patient's stated satisfaction. Encouraged patient to call with any new concerns or questions before her next visit to the cancer center and we can certain see her sooner, if needed.     Plan of care discussed with Dr. Talbert Cage, who agrees with the above aforementioned.    Orders placed this encounter:  No orders of the defined types were placed in this encounter.     Mike Craze, NP [facility] [phone]

## 2017-06-17 ENCOUNTER — Telehealth: Payer: Self-pay | Admitting: Family Medicine

## 2017-06-17 NOTE — Telephone Encounter (Signed)
[name], PT  [facility], left message stating she was only able to see patient 1 time this week due to chemo. FYI Callback#[phone]

## 2017-06-21 ENCOUNTER — Telehealth: Payer: Self-pay | Admitting: Family Medicine

## 2017-06-21 NOTE — Telephone Encounter (Signed)
Sharyn Lull, therapist with [facility], left another message regarding patient. She states that patient missed visit- which she called about last week- and she is calling now to get verbal orders to add that visit back to the end of her visits. If not, patient is scheduled for discharge at the end of the week.  Callback# [phone]

## 2017-06-21 NOTE — Telephone Encounter (Signed)
Noted, hopefully will be able to see pt soon

## 2017-06-21 NOTE — Telephone Encounter (Signed)
[place] Nurse with [facility], left message on nurse line to inform of missed visit. She was unable to reach anyone by phone.

## 2017-06-21 NOTE — Telephone Encounter (Signed)
pls add visit back that was missed. Verbal order given

## 2017-06-22 ENCOUNTER — Telehealth: Payer: Self-pay | Admitting: Family Medicine

## 2017-06-22 NOTE — Telephone Encounter (Signed)
[name], PT [facility], left message asking for verbal orders to extend care due to missed visits. Would like orders for 2 times a week for 1 week starting next week  Callback# [phone]

## 2017-06-22 NOTE — Telephone Encounter (Signed)
Given

## 2017-06-23 NOTE — Telephone Encounter (Signed)
Orders given.  

## 2017-06-27 ENCOUNTER — Encounter (HOSPITAL_COMMUNITY)
Admission: RE | Admit: 2017-06-27 | Discharge: 2017-06-27 | Disposition: A | Discharge status: Home / Self Care | Payer: Medicare Other | Source: Ambulatory Visit | Attending: Adult Health | Admitting: Adult Health

## 2017-06-27 DIAGNOSIS — C8333 Diffuse large B-cell lymphoma, intra-abdominal lymph nodes: Secondary | ICD-10-CM | POA: Insufficient documentation

## 2017-06-27 LAB — GLUCOSE, CAPILLARY: Glucose-Capillary: 123 mg/dL — ABNORMAL HIGH (ref 65–99)

## 2017-06-27 MED ORDER — FLUDEOXYGLUCOSE F - 18 (FDG) INJECTION
7.3000 | Freq: Once | INTRAVENOUS | Status: AC | PRN
  Administered 2017-06-27: 7.3 via INTRAVENOUS

## 2017-06-30 ENCOUNTER — Encounter (HOSPITAL_COMMUNITY): Payer: Medicare Other | Attending: Oncology | Admitting: Oncology

## 2017-06-30 ENCOUNTER — Encounter (HOSPITAL_COMMUNITY): Payer: Self-pay | Admitting: Oncology

## 2017-06-30 ENCOUNTER — Encounter (HOSPITAL_COMMUNITY): Payer: Self-pay

## 2017-06-30 VITALS — BP 127/72 | HR 72 | Temp 98.2°F | Resp 16 | Wt 152.8 lb

## 2017-06-30 DIAGNOSIS — C8333 Diffuse large B-cell lymphoma, intra-abdominal lymph nodes: Secondary | ICD-10-CM | POA: Diagnosis not present

## 2017-06-30 DIAGNOSIS — D500 Iron deficiency anemia secondary to blood loss (chronic): Secondary | ICD-10-CM | POA: Insufficient documentation

## 2017-06-30 NOTE — Progress Notes (Signed)
Patient is a fall risk. Yellow fall risk band applied to wrist.

## 2017-06-30 NOTE — Patient Instructions (Signed)
[facility] Discharge Instructions  RECOMMENDATIONS MADE BY THE CONSULTANT AND ANY TEST RESULTS WILL BE SENT TO YOUR REFERRING PHYSICIAN.  You saw Dr. Zhou today.  Thank you for choosing [facility] to provide your oncology and hematology care.  To afford each patient quality time with our provider, please arrive at least 15 minutes before your scheduled appointment time.    If you have a lab appointment with the Cancer Center please come in thru the  Main Entrance and check in at the main information desk  You need to re-schedule your appointment should you arrive 10 or more minutes late.  We strive to give you quality time with our providers, and arriving late affects you and other patients whose appointments are after yours.  Also, if you no show three or more times for appointments you may be dismissed from the clinic at the providers discretion.     Again, thank you for choosing [facility].  Our hope is that these requests will decrease the amount of time that you wait before being seen by our physicians.       _____________________________________________________________  Should you have questions after your visit to [facility], please contact our office at [phone] between the hours of 8:30 a.m. and 4:30 p.m.  Voicemails left after 4:30 p.m. will not be returned until the following business day.  For prescription refill requests, have your pharmacy contact our office.       Resources For Cancer Patients and their Caregivers ? American Cancer Society: Can assist with transportation, wigs, general needs, runs Look Good Feel Better.        1-888-227-6333 ? Cancer Care: Provides financial assistance, online support groups, medication/co-pay assistance.  1-800-813-HOPE (4673) ? [facility] Assists [place] cancer patients and their families through  emotional , educational and financial support.  336-427-4357 ? [place] DSS Where to apply for food stamps, Medicaid and utility assistance. 336-342-1394 ? [facility]: Transportation to medical appointments. 336-347-2287 ? Social Security Administration: May apply for disability if have a Stage IV cancer. 336-342-7796 1-800-772-1213 ? [place] Aging, Disability and Transit Services: Assists with nutrition, care and transit needs. 336-349-2343  Cancer Center Support Programs: @10RELATIVEDAYS@ > Cancer Support Group  2nd Tuesday of the month 1pm-2pm, Journey Room  > Creative Journey  3rd Tuesday of the month 1130am-1pm, Journey Room  > Look Good Feel Better  1st Wednesday of the month 10am-12 noon, Journey Room (Call American Cancer Society to register 1-800-395-5775)    

## 2017-06-30 NOTE — Progress Notes (Unsigned)
Faxed referral to [facility].

## 2017-06-30 NOTE — Progress Notes (Signed)
[place], [place] [address]   CLINIC:  Medical Oncology/Hematology  PCP:  Fayrene Helper, MD [address], [address] [place] [address] [phone]   REASON FOR VISIT:  Follow-up for Diffuse Large B-cell Lymphoma (DLBCL):   CURRENT THERAPY: R-CHOP  BRIEF ONCOLOGIC HISTORY:    Diffuse large B cell lymphoma ([place])   12/20/2016 Imaging    CT abd/pelvis: IMPRESSION: 1. Extensive abdominal, pelvic and retrocrural lymphadenopathy, most pronounced in the retroperitoneum, as detailed above. Findings are highly suspicious for lymphoproliferative disorder and further evaluation is strongly recommended at this time. 2. Colonic diverticulosis without evidence of acute diverticulitis at this time. 3. Aortic atherosclerosis. 4. Additional incidental findings, as above.      12/30/2016 PET scan    IMPRESSION: 1. Extensive hypermetabolic abdominal and pelvic adenopathy, including bulky retroperitoneal adenopathy lifting of the abdominal aorta away from the spine. One of the larger lymph nodes measures 8.4 cm in short axis with maximum SUV 25.0. 2. Small pleural focus of tumor in the left posteromedial chest, maximum SUV only 4.2. This represents the only thoracic involvement. 3. Other imaging findings of potential clinical significance: Atherosclerotic aortic arch. Mild cardiomegaly. Mild atelectasis in both lower lobes. Hyperdense left renal cysts as on prior CT. Mild left hydronephrosis likely due to extrinsic mass effect on the left ureter. Right greater than left degenerative arthropathy of the hips.      01/31/2017 Surgery    Exploratory lap Arnoldo Morale)       01/31/2017 Pathology Results    Lymph node biospy: DLBCL, high-grade, monoclonal kappa restricted.       02/07/2017 Initial Diagnosis    Diffuse large B cell lymphoma ([place])     02/14/2017 Echocardiogram    Pre-chemo ECHO: EF 55%.       03/01/2017 Bone Marrow Biopsy   No bone marrow involvement by lymphoma. [facility]ay cytogenetics      03/01/2017 -  Chemotherapy    The patient had DOXOrubicin (ADRIAMYCIN) chemo injection 90 mg, 50 mg/m2 = 90 mg, Intravenous,  Once, 1 of 6 cycles  palonosetron (ALOXI) injection 0.25 mg, 0.25 mg, Intravenous,  Once, 1 of 6 cycles  pegfilgrastim (NEULASTA ONPRO KIT) injection 6 mg, 6 mg, Subcutaneous, Once, 1 of 6 cycles  vinCRIStine (ONCOVIN) 2 mg in sodium chloride 0.9 % 50 mL chemo infusion, 2 mg, Intravenous,  Once, 1 of 6 cycles  riTUXimab (RITUXAN) 700 mg in sodium chloride 0.9 % 250 mL (2.1875 mg/mL) chemo infusion, 375 mg/m2 = 700 mg, Intravenous,  Once, 1 of 6 cycles Dose modification: 375 mg/m2 (original dose 375 mg/m2, Cycle 1)  cyclophosphamide (CYTOXAN) 1,340 mg in sodium chloride 0.9 % 250 mL chemo infusion, 750 mg/m2 = 1,340 mg, Intravenous,  Once, 1 of 6 cycles  for chemotherapy treatment.        04/29/2017 PET scan    1. Overall marked improvement with prominent reduction in size and activity of the retroperitoneal and pelvic adenopathy. The dominant prior periaortic node has reduced in size from 8.4 cm in short axis to 3.4 cm, and from an SUV of 15.02 current SUV of 3.7. This current SUV qualifies as Deauville 4 disease. 2. Prior focus of pleural activity in the left hemithorax has resolved. 3. The hydronephrosis in the left kidney has resolved although there could still be some mild residual collecting system wall thickening. 4. Other imaging findings of potential clinical significance: Aortic Atherosclerosis (ICD10-I70.0). Cardiomegaly. Prominent stool throughout the colon  favors constipation. Small complex left renal cysts. Degenerative arthropathy of both hips. Lumbar spondylosis and degenerative disc disease.      06/27/2017 PET scan    IMPRESSION: 1. Slight interval decrease and FDG accumulation in the abdominal retroperitoneal lymphadenopathy in the small index left external iliac pelvic  sidewall lymph node with minimal associated decrease in size on CT imaging. Based on FDG accumulation within the sites of disease on today's study, the Deauville score is 2. 2.  Aortic Atherosclerois (ICD10-170.0)        INTERVAL HISTORY:  Ms. [name] 83 y.o. female returns for follow-up and to review results of recent imaging and pathology.   Patient presents with her daughter today for continued follow-up and review of her restaging PET scan. Patient states that she's been doing well. She denies any severe fatigue. She states that her neuropathy is improving in her hands. She has chronic leg swelling, left worse than the right which is unchanged. She also has chronic constipation for which she uses MiraLAX with improvement in her symptoms. She denies any chest pain, shortness breath, abdominal pain, weight loss, drenching night sweats, recent infections.  REVIEW OF SYSTEMS:  Review of Systems  Constitutional: Negative for appetite change, diaphoresis, fatigue, fever and unexpected weight change.  HENT:  Negative.   Eyes: Negative.   Respiratory: Negative.  Negative for shortness of breath.   Cardiovascular: Positive for leg swelling (chronic in left leg).  Gastrointestinal: Positive for constipation (uses laxatives). Negative for abdominal pain.  Endocrine: Negative.  Negative for hot flashes.  Genitourinary: Negative.    Musculoskeletal: Negative.   Neurological:       Neuropathy in fingertips, worse in left hand than right hand  Hematological: Negative.   Psychiatric/Behavioral: Negative.      PAST MEDICAL/SURGICAL HISTORY:  Past Medical History:  Diagnosis Date  . Arthritis of knee    bilateral    . Chronic renal insufficiency   . Diabetes mellitus, type 2 ([facility])   . GERD (gastroesophageal reflux disease)   . History of kidney stones   . Hypertension   . Iron deficiency anemia 12/28/2016  . Lymphadenopathy, abdominal 12/28/2016  . Osteopenia   . PAD (peripheral artery  disease) ([facility])   . Personal history of colonic polyps 3 years ago    colonoscopy    Past Surgical History:  Procedure Laterality Date  . BIOPSY  12/16/2016   Procedure: BIOPSY;  Surgeon: Rogene Houston, MD;  [facility];  Service: Endoscopy;;  gastric polyp  . bunionectomy bilateral  feet    . COLONOSCOPY N/A 12/16/2016   Procedure: COLONOSCOPY;  Surgeon: Rogene Houston, MD;  [facility];  Service: Endoscopy;  Laterality: N/A;  2:15  . DENTAL SURGERY    . ESOPHAGOGASTRODUODENOSCOPY N/A 12/16/2016   Procedure: ESOPHAGOGASTRODUODENOSCOPY (EGD);  Surgeon: Rogene Houston, MD;  [facility];  Service: Endoscopy;  Laterality: N/A;  . gum disease  2010   treated by Dr. Andree Elk   . LAPAROTOMY N/A 01/31/2017   Procedure: EXPLORATORY LAPAROTOMY;  Surgeon: Aviva Signs, MD;  Location: [facility];  Service: General;  Laterality: N/A;  . Left knee manipulation  under anasthesia due to contracture -  03/08/08   Dr. Aline Brochure   . left knee replacement  08/14/08   Dr. Aline Brochure   . LYMPH NODE BIOPSY  01/31/2017   Procedure: LYMPH NODE BIOPSY;  Surgeon: Aviva Signs, MD;  Location: [facility];  Service: General;;  . PORTACATH PLACEMENT Left 02/21/2017  Procedure: INSERTION PORT-A-CATH;  Surgeon: Aviva Signs, MD;  Location: [facility];  Service: General;  Laterality: Left;  . TOTAL ABDOMINAL HYSTERECTOMY     bleeding   . TOTAL KNEE ARTHROPLASTY Right 05/12/2016   Procedure: TOTAL KNEE ARTHROPLASTY;  Surgeon: [name], MD;  Location: [facility];  Service: Orthopedics;  Laterality: Right;  . WISDOM TOOTH EXTRACTION       SOCIAL HISTORY:  Social History   Social History  . Marital status: Widowed    Spouse name: N/A  . Number of children: 3  . Years of education: college    Occupational History  . retired Chemical engineer  Retired   Social History Main Topics  . Smoking status: Former Smoker    Packs/day: 0.25    Years: 30.00    Types: Cigarettes    Quit date: 10/19/1999  .  Smokeless tobacco: Never Used  . Alcohol use No  . Drug use: No  . Sexual activity: Not Currently     Comment: widowed   Other Topics Concern  . Not on file   Social History Narrative  . No narrative on file    FAMILY HISTORY:  Family History  Problem Relation Age of Onset  . Colon cancer Father   . Cancer Mother   . Stroke Mother   . Hypertension Brother   . Dementia Sister   . Multiple sclerosis Daughter   . Pseudotumor cerebri Daughter   . Neuropathy Daughter     CURRENT MEDICATIONS:  Outpatient Encounter Prescriptions as of 02/08/2017  Medication Sig Note  . acetaminophen (TYLENOL) 325 MG tablet Take 650 mg by mouth every 4 (four) hours as needed for mild pain.    Marland Kitchen aspirin EC 81 MG tablet Take 81 mg by mouth daily. 01/24/2017: On hold due to upcoming procedure.  . Calcium Carb-Cholecalciferol (CALCIUM 600+D) 600-800 MG-UNIT TABS Take 1 tablet by mouth 2 (two) times daily.   . diphenhydrAMINE (BENADRYL) 50 MG tablet Take 50 mg by mouth at bedtime as needed for itching.   Marland Kitchen lisinopril-hydrochlorothiazide (PRINZIDE,ZESTORETIC) 20-12.5 MG tablet TAKE ONE TABLET BY MOUTH ONCE DAILY   . magnesium hydroxide (MILK OF MAGNESIA) 400 MG/5ML suspension Take 30 mLs by mouth every other day.   . multivitamin (THERAGRAN) per tablet Take 1 tablet by mouth daily.   01/24/2017: Time varies  . pantoprazole (PROTONIX) 40 MG tablet Take 1 tablet (40 mg total) by mouth daily before breakfast.   . Pediatric Multivitamins-Iron (FLINTSTONES PLUS IRON) chewable tablet Chew 1 tablet by mouth 2 (two) times daily.   . polyethylene glycol (MIRALAX / GLYCOLAX) packet Take 17 g by mouth daily as needed (constipation).  12/14/2016: Hasn't started yet.  . traMADol (ULTRAM) 50 MG tablet Take 1 tablet (50 mg total) by mouth every 6 (six) hours as needed for moderate pain.    No facility-administered encounter medications on file as of 02/08/2017.     ALLERGIES:  Allergies  Allergen Reactions  . Codeine  Hives  . Fosamax [Alendronate Sodium] Other (See Comments)    Pt reports excessive urination on the day she took the pill     PHYSICAL EXAM:  ECOG Performance status: 2 - Symptomatic; requires occasional assistance    Physical Exam  Constitutional: She is oriented to person, place, and time.  HENT:  Head: Normocephalic and atraumatic.  Mouth/Throat: Oropharynx is clear and moist.  Eyes: Pupils are equal, round, and reactive to light. Conjunctivae are normal.  Neck: Normal range of motion. Neck supple.  Cardiovascular: Normal rate and regular rhythm.  Exam reveals no gallop and no friction rub.   No murmur heard. Pulmonary/Chest: Effort normal. No respiratory distress. She has no wheezes. She has no rales. She exhibits no tenderness.  Abdominal: Soft. Bowel sounds are normal. She exhibits no distension. There is no tenderness. There is no rebound.  Musculoskeletal: Normal range of motion.  Left 3+ pitting pedal edema, 2+ pitting edema on right  Lymphadenopathy:    She has no axillary adenopathy.       Right: No supraclavicular adenopathy present.       Left: No supraclavicular adenopathy present.  Neurological: She is alert and oriented to person, place, and time.  Skin: Skin is warm and dry.  Psychiatric: Mood, memory, affect and judgment normal.  Nursing note and vitals reviewed.    LABORATORY DATA:  I have reviewed the labs as listed.  CBC    Component Value Date/Time   WBC 3.8 (L) 06/14/2017 0939   RBC 2.67 (L) 06/14/2017 0939   HGB 9.1 (L) 06/14/2017 0939   HCT 28.0 (L) 06/14/2017 0939   PLT 272 06/14/2017 0939   MCV 104.9 (H) 06/14/2017 0939   MCH 34.1 (H) 06/14/2017 0939   MCHC 32.5 06/14/2017 0939   RDW 17.7 (H) 06/14/2017 0939   LYMPHSABS 0.8 06/14/2017 0939   MONOABS 0.6 06/14/2017 0939   EOSABS 0.0 06/14/2017 0939   BASOSABS 0.0 06/14/2017 0939   CMP Latest Ref Rng & Units 06/14/2017 05/24/2017 05/03/2017  Glucose 65 - 99 mg/dL 142(H) 116(H) 127(H)  BUN 6  - 20 mg/dL 18 29(H) 26(H)  Creatinine 0.44 - 1.00 mg/dL 0.94 0.93 1.02(H)  Sodium 135 - 145 mmol/L 140 142 141  Potassium 3.5 - 5.1 mmol/L 4.0 3.5 3.6  Chloride 101 - 111 mmol/L 104 107 103  CO2 22 - 32 mmol/L _0 Calcium 8.9 - 10.3 mg/dL 9.0 9.1 9.1  Total Protein 6.5 - 8.1 g/dL 5.7(L) 5.8(L) 5.8(L)  Total Bilirubin 0.3 - 1.2 mg/dL 0.4 0.5 0.5  Alkaline Phos 38 - 126 U/L 59 59 57  AST 15 - 41 U/L _1 ALT 14 - 54 U/L 13(L) 13(L) 14    PENDING LABS:    DIAGNOSTIC IMAGING:  PET scan: 12/30/16       PATHOLOGY:  Lymph node biopsy: 01/31/17    Cytology: 01/31/17    ASSESSMENT & PLAN:  Stage IV Diffuse Large B-cell Lymphoma (DLBCL) with small pleural tumor focus on the posteromedial aspect of left chest; no bone marrow involvement. IPI score of 3. She is in the high to intermediate risk group. Predicted 4 year progression free survival rate is 43%.   Decreased her vincristine to 71m from 236mfrom cycle 2 onwards  due to worsening neuropathy.  6 cycles of R-CHOP from 03/01/17 to 06/14/17. Post treatment PET shows similar appearance of the bulky abdominal retroperitoneal lymphadenopathy. Index left para-aortic lymph node measured at 3.4 cm on the prior study measures 3.2 cm at the same level and in the same dimension today. SUV max = 2.4 for this lymph node compare to 3.7 previously. The index left external iliac lymph node measured previously at 0.9 cm short axis now measures 0.8 cm (image 135 series 4). SUV max =1.8 on today's exam compared to 2.0 previously. No evidence for progressive abdominopelvic lymphadenopathy on CT images. No new or progressive hypermetabolic disease in the abdomen or pelvis.   PLAN: I have reviewed patient's PET scan in detail  with her and her daughter today. She's had a partial response with residual disease despite completing 6 cycles of chemotherapy. There was not much change in her lymphadenopathy after cycle 3 of chemo.  I will send  patient to rad-onc to see if ISRT is possible for her residual disease.  Patient is frail and started having more problems with fatigue and neuropathy as her R-CHOP chemo progressed. If ISRT is not an option then I would place her on 2nd line chemotherapy with bendamustine and rituxan.  Patient is not a candidate for high dose therapy.   RTC in 4 weeks for follow up.   All questions were answered to patient's stated satisfaction. Encouraged patient to call with any new concerns or questions before her next visit to the cancer center and we can certain see her sooner, if needed.      This note was electronically signed by:  Twana First, MD 06/30/17

## 2017-07-07 ENCOUNTER — Ambulatory Visit: Payer: Medicare Other | Admitting: Family Medicine

## 2017-07-28 ENCOUNTER — Ambulatory Visit: Payer: Medicare Other | Admitting: Family Medicine

## 2017-07-28 ENCOUNTER — Other Ambulatory Visit (HOSPITAL_COMMUNITY): Payer: Self-pay | Admitting: *Deleted

## 2017-07-28 DIAGNOSIS — C8330 Diffuse large B-cell lymphoma, unspecified site: Secondary | ICD-10-CM

## 2017-07-29 ENCOUNTER — Other Ambulatory Visit (HOSPITAL_COMMUNITY): Payer: Medicare Other

## 2017-07-29 ENCOUNTER — Ambulatory Visit (HOSPITAL_COMMUNITY): Payer: Medicare Other | Admitting: Hematology and Oncology

## 2017-08-02 IMAGING — PT NM PET TUM IMG RESTAG (PS) SKULL BASE T - THIGH
8 series · 25 of 25 positions shown · non-contrast
Comparison: 12/30/2016

CLINICAL DATA: Subsequent treatment strategy for diffuse large
B-cell lymphoma.

EXAM:
NUCLEAR MEDICINE PET SKULL BASE TO THIGH
TECHNIQUE: 6.7 mCi F-18 FDG was injected intravenously. Full-ring PET imaging
was performed from the skull base to thigh after the radiotracer. CT
data was obtained and used for attenuation correction and anatomic
localization.
FASTING BLOOD GLUCOSE:  Value: 94 mg/dl

[Series 3: pet sk_thigh ac · axial · 5.0mm · 4.07mm/px · z∈[-202,+626]mm · 5 of 208 slices shown]
[im 1/208]
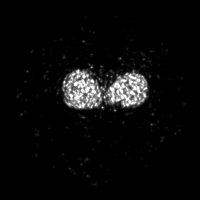
[im 52/208]
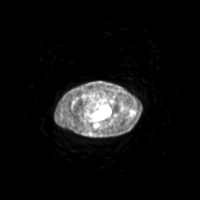
[im 104/208]
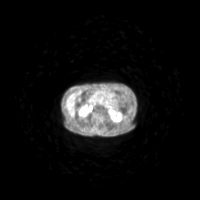
[im 156/208]
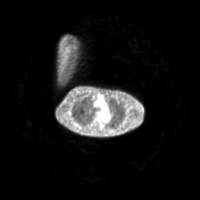
[im 208/208]
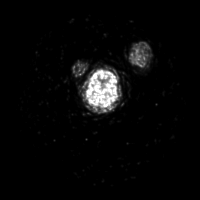

[Series 4: ct sk_thigh 5.0 b31f · axial · 5.0mm · 0.98mm/px · z∈[-202,+626]mm · 5 of 208 slices shown]
[im 1/208]
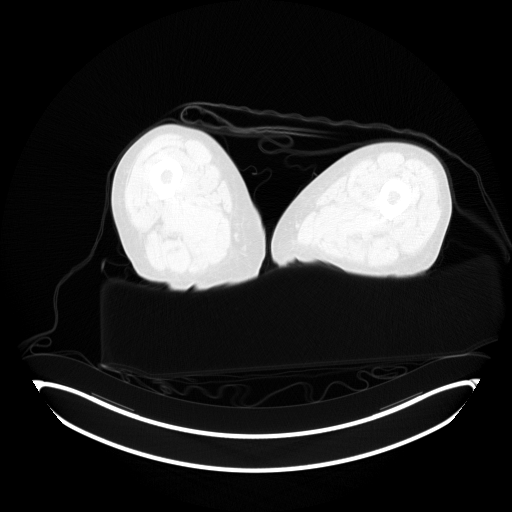
[im 52/208]
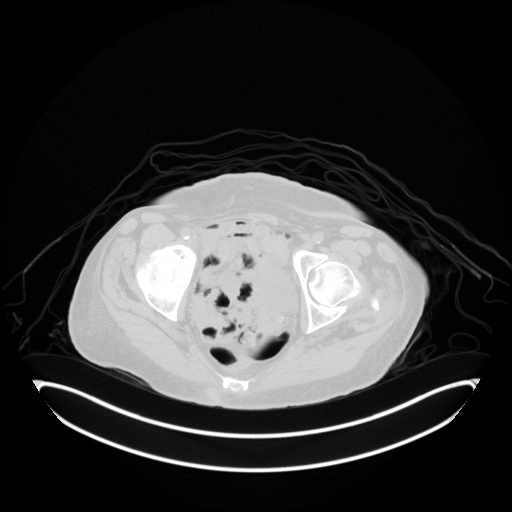
[im 104/208]
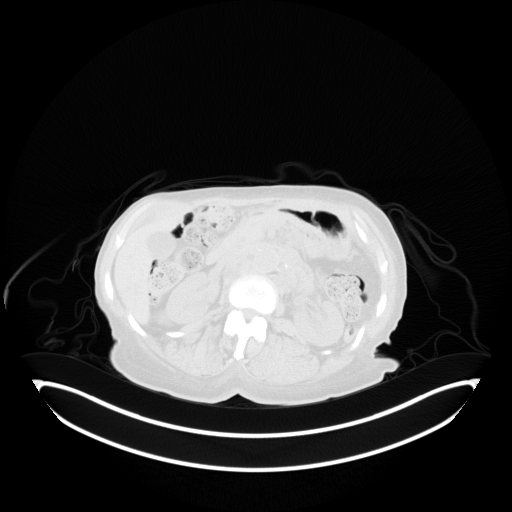
[im 156/208]
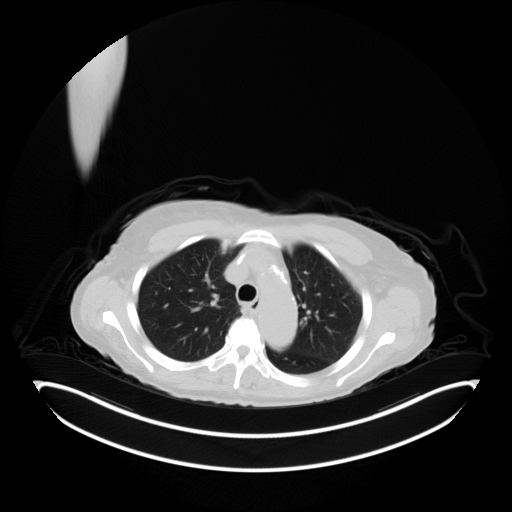
[im 208/208  brain]
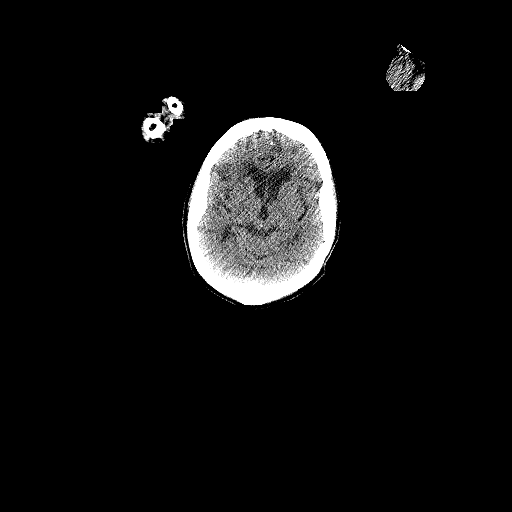

[Series 7: pet sk_thigh nac · axial · 5.0mm · 4.07mm/px · z∈[-202,+626]mm · 5 of 208 slices shown]
[im 1/208]
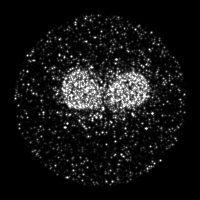
[im 52/208]
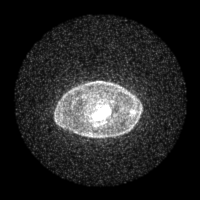
[im 104/208]
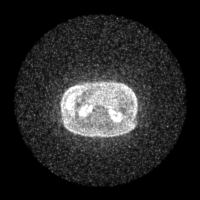
[im 156/208]
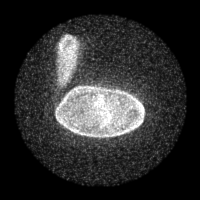
[im 208/208]
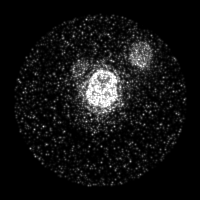

[Series 8: ct sk_thigh 5.0 b70f lung_bone · axial · 5.0mm · 0.68mm/px · z∈[+262,+502]mm · 2 of 61 slices shown]
[im 1/61  bone]
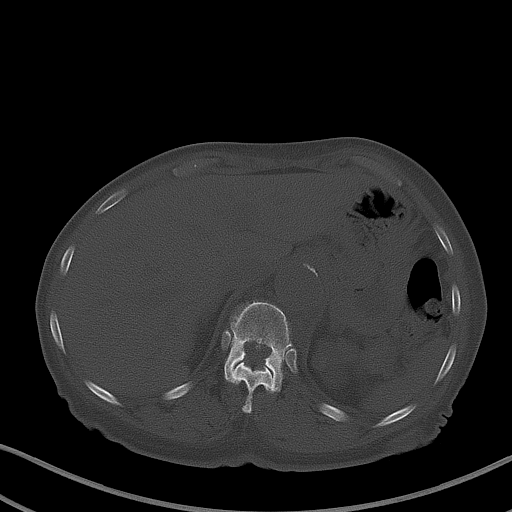
[im 61/61  bone]
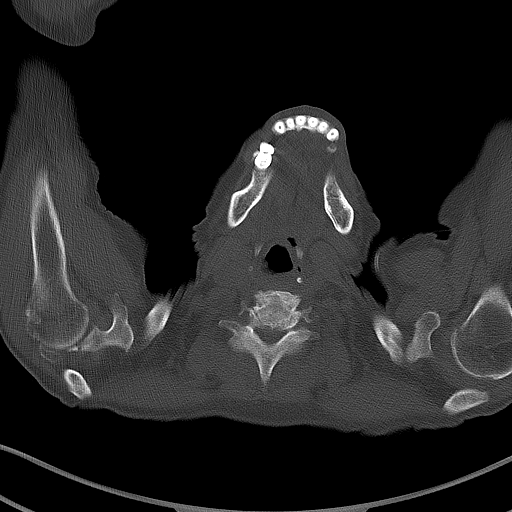

[Series 603: mip collection · coronal · 1.72mm/px · 1 of 32 slices shown]
[im 1/32]
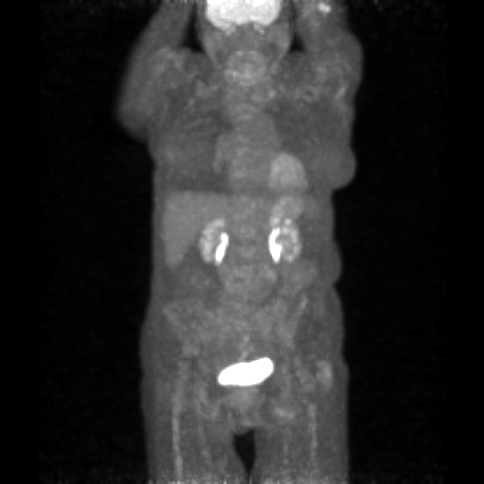

[Series 604: range-ct sk_thigh 5.0 (id)<alpha range> · 2 of 68 slices shown (1 of 2)]
[im 1/68]
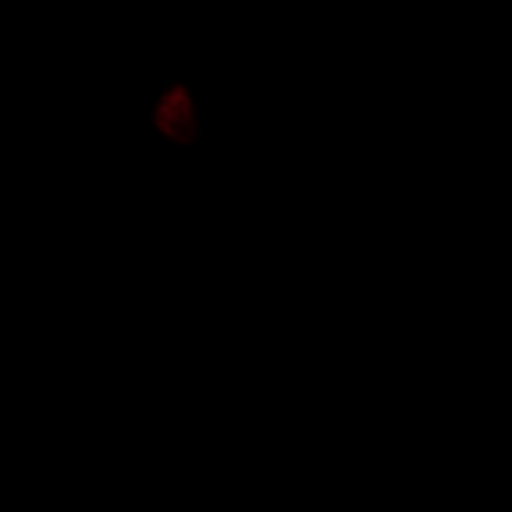
[im 68/68]
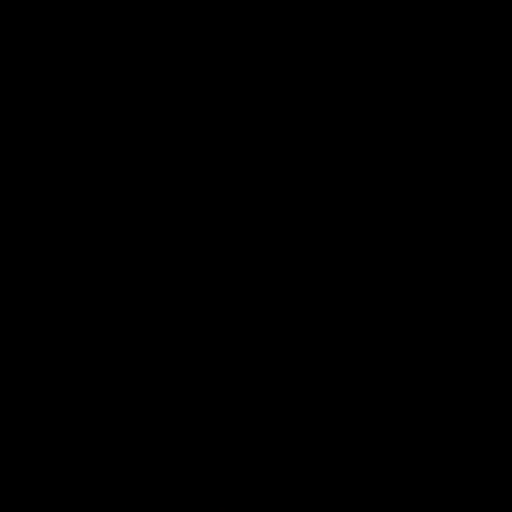

[Series 605: range-ct sk_thigh 5.0 (id)<alpha range> · 4 of 163 slices shown (2 of 2)]
[im 1/163]
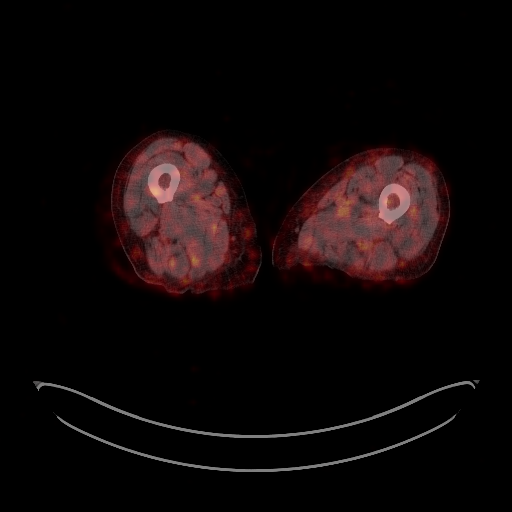
[im 55/163]
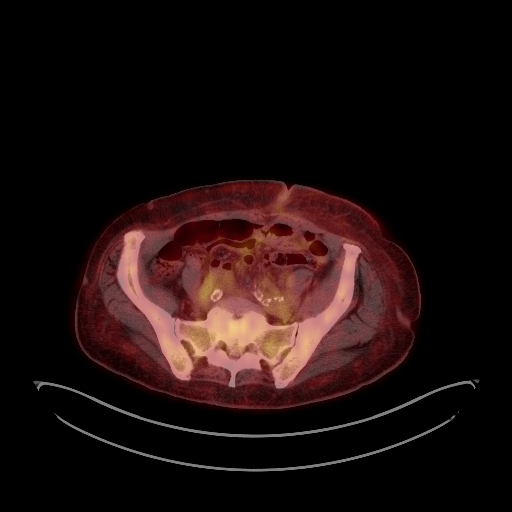
[im 109/163]
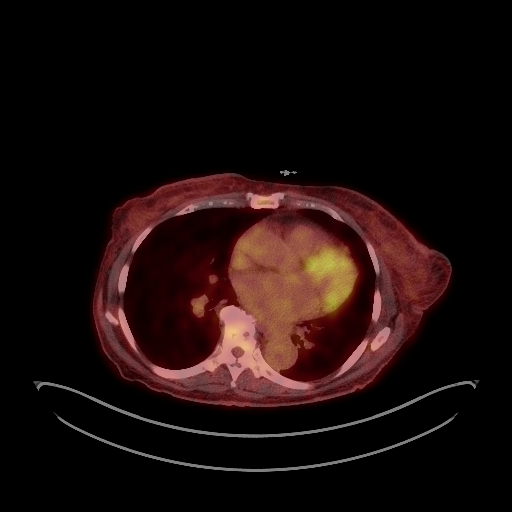
[im 163/163]
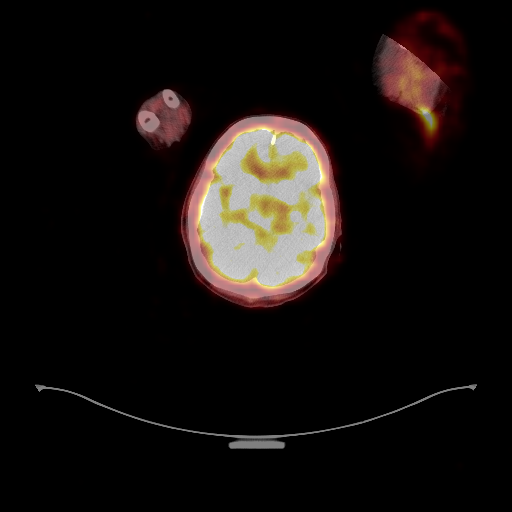

[Series 1081: results mm oncology reading · 5.0mm · 0.79mm/px · 1 of 2 slices shown]
[im 1/2]
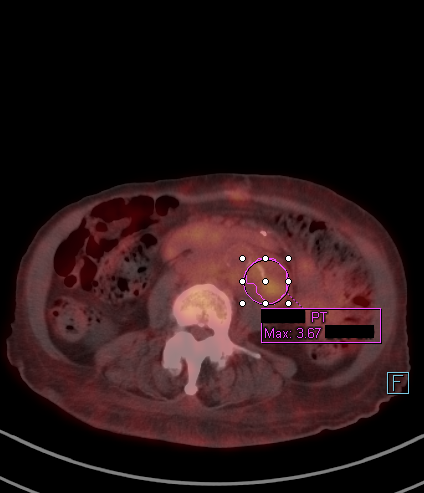

[25 of 25 positions shown; findings below may reference images not displayed]

FINDINGS: NECK

No hypermetabolic lymph nodes in the neck.

CHEST

The previous pleural activity along the left posteromedial
hemithoracic pleural margin has resolved. There is no residual
abnormal thoracic activity.

Left Port-A-Cath tip: SVC. Cardiomegaly is present along with
atherosclerotic calcification of the aortic arch and descending
thoracic aorta. Low-density blood pool suggests anemia. Mild
atelectasis or scarring in the lung bases.

ABDOMEN/PELVIS

Marked improvement in the activity and considerable improvement in
the size of the bulky retroperitoneal adenopathy. A left periaortic
lymph node previously measuring 8.4 cm in short axis currently
measures 3.4 cm in short axis on image 116/4, with maximum SUV
(formerly 15.0). The remaining bulky retroperitoneal adenopathy is
likewise dramatically improved both in size and metabolic activity.
For reference purposes, background mediastinal blood pool activity
level is SUV 2.0 and hepatic background activity level is 3.0.
Reference left external iliac node is indistinctly marginated and
measures 0.9 cm in short axis on image 146/4 (formerly 1.8 cm) with
maximum SUV 2.0 (formerly 6.7).

Aortoiliac atherosclerotic vascular disease. The left hydronephrosis
has resolved although there may be some mild wall thickening in the
left renal collecting system. Small hyperdense left renal lesions
are probably complex cysts but remain nonspecific. Exophytic right
renal cyst noted.

Prominent stool throughout the colon favors constipation.

SKELETON

No focal hypermetabolic activity to suggest skeletal metastasis.

Degenerative arthropathy of both hips. Large bone island in the left
proximal femur. Lumbar spondylosis and degenerative disc disease.
Degenerative bilateral glenohumeral arthropathy. No significant
abnormal skeletal hypermetabolic activity.
IMPRESSION: 1. Overall marked improvement with prominent reduction in size and
activity of the retroperitoneal and pelvic adenopathy. The dominant
prior periaortic node has reduced in size from 8.4 cm in short axis
to 3.4 cm, and from an SUV of 15.02 current SUV of 3.7. This current
SUV qualifies as [HOSPITAL] 4 disease.
2. Prior focus of pleural activity in the left hemithorax has
resolved.
3. The hydronephrosis in the left kidney has resolved although there
could still be some mild residual collecting system wall thickening.
4. Other imaging findings of potential clinical significance: Aortic
Atherosclerosis (YN2BY-MEA.A). Cardiomegaly. Prominent stool
throughout the colon favors constipation. Small complex left renal
cysts. Degenerative arthropathy of both hips. Lumbar spondylosis and
degenerative disc disease.

## 2017-09-02 ENCOUNTER — Other Ambulatory Visit (HOSPITAL_COMMUNITY): Payer: Medicare Other

## 2017-09-02 ENCOUNTER — Other Ambulatory Visit (HOSPITAL_COMMUNITY): Payer: Self-pay | Admitting: Oncology

## 2017-09-02 ENCOUNTER — Ambulatory Visit (HOSPITAL_COMMUNITY): Payer: Medicare Other

## 2017-09-02 DIAGNOSIS — C8333 Diffuse large B-cell lymphoma, intra-abdominal lymph nodes: Secondary | ICD-10-CM

## 2017-09-19 ENCOUNTER — Ambulatory Visit (HOSPITAL_COMMUNITY)
Admission: RE | Admit: 2017-09-19 | Discharge: 2017-09-19 | Disposition: A | Discharge status: Home / Self Care | Payer: Medicare Other | Source: Ambulatory Visit | Attending: Oncology | Admitting: Oncology

## 2017-09-19 DIAGNOSIS — I2510 Atherosclerotic heart disease of native coronary artery without angina pectoris: Secondary | ICD-10-CM | POA: Insufficient documentation

## 2017-09-19 DIAGNOSIS — C8333 Diffuse large B-cell lymphoma, intra-abdominal lymph nodes: Secondary | ICD-10-CM | POA: Insufficient documentation

## 2017-09-19 DIAGNOSIS — I700 Atherosclerosis of aorta: Secondary | ICD-10-CM | POA: Diagnosis not present

## 2017-09-19 DIAGNOSIS — Z01812 Encounter for preprocedural laboratory examination: Secondary | ICD-10-CM | POA: Insufficient documentation

## 2017-09-19 DIAGNOSIS — K8020 Calculus of gallbladder without cholecystitis without obstruction: Secondary | ICD-10-CM | POA: Insufficient documentation

## 2017-09-19 LAB — GLUCOSE, CAPILLARY: Glucose-Capillary: 84 mg/dL (ref 65–99)

## 2017-09-19 MED ORDER — FLUDEOXYGLUCOSE F - 18 (FDG) INJECTION
7.5900 | Freq: Once | INTRAVENOUS | Status: AC | PRN
  Administered 2017-09-19: 7.59 via INTRAVENOUS

## 2017-09-20 ENCOUNTER — Encounter (HOSPITAL_COMMUNITY): Payer: Medicare Other

## 2017-09-22 ENCOUNTER — Other Ambulatory Visit: Payer: Self-pay

## 2017-09-22 ENCOUNTER — Encounter (HOSPITAL_BASED_OUTPATIENT_CLINIC_OR_DEPARTMENT_OTHER): Payer: Medicare Other

## 2017-09-22 ENCOUNTER — Other Ambulatory Visit (HOSPITAL_COMMUNITY): Payer: Self-pay | Admitting: Oncology

## 2017-09-22 ENCOUNTER — Encounter (HOSPITAL_COMMUNITY): Payer: Self-pay | Admitting: Oncology

## 2017-09-22 ENCOUNTER — Encounter (HOSPITAL_COMMUNITY): Payer: Medicare Other | Attending: Oncology | Admitting: Oncology

## 2017-09-22 VITALS — BP 147/80 | HR 88 | Temp 97.6°F | Resp 18 | Wt 147.2 lb

## 2017-09-22 DIAGNOSIS — D500 Iron deficiency anemia secondary to blood loss (chronic): Secondary | ICD-10-CM | POA: Diagnosis not present

## 2017-09-22 DIAGNOSIS — C8333 Diffuse large B-cell lymphoma, intra-abdominal lymph nodes: Secondary | ICD-10-CM

## 2017-09-22 DIAGNOSIS — C8330 Diffuse large B-cell lymphoma, unspecified site: Secondary | ICD-10-CM

## 2017-09-22 LAB — PHOSPHORUS: Phosphorus: 2.9 mg/dL (ref 2.5–4.6)

## 2017-09-22 LAB — CBC WITH DIFFERENTIAL/PLATELET
Basophils Absolute: 0 10*3/uL (ref 0.0–0.1)
Basophils Relative: 1 %
EOS ABS: 0.1 10*3/uL (ref 0.0–0.7)
Eosinophils Relative: 4 %
HEMATOCRIT: 32.5 % — AB (ref 36.0–46.0)
HEMOGLOBIN: 10.3 g/dL — AB (ref 12.0–15.0)
LYMPHS ABS: 0.4 10*3/uL — AB (ref 0.7–4.0)
Lymphocytes Relative: 19 %
MCH: 33.7 pg (ref 26.0–34.0)
MCHC: 31.7 g/dL (ref 30.0–36.0)
MCV: 106.2 fL — ABNORMAL HIGH (ref 78.0–100.0)
MONO ABS: 0.2 10*3/uL (ref 0.1–1.0)
MONOS PCT: 10 %
NEUTROS ABS: 1.5 10*3/uL — AB (ref 1.7–7.7)
NEUTROS PCT: 66 %
Platelets: 213 10*3/uL (ref 150–400)
RBC: 3.06 MIL/uL — ABNORMAL LOW (ref 3.87–5.11)
RDW: 13 % (ref 11.5–15.5)
WBC: 2.2 10*3/uL — ABNORMAL LOW (ref 4.0–10.5)

## 2017-09-22 LAB — COMPREHENSIVE METABOLIC PANEL
ALBUMIN: 3.3 g/dL — AB (ref 3.5–5.0)
ALT: 15 U/L (ref 14–54)
AST: 24 U/L (ref 15–41)
Alkaline Phosphatase: 71 U/L (ref 38–126)
Anion gap: 8 (ref 5–15)
BUN: 28 mg/dL — ABNORMAL HIGH (ref 6–20)
CHLORIDE: 108 mmol/L (ref 101–111)
CO2: 28 mmol/L (ref 22–32)
CREATININE: 1.06 mg/dL — AB (ref 0.44–1.00)
Calcium: 9.3 mg/dL (ref 8.9–10.3)
GFR calc non Af Amer: 48 mL/min — ABNORMAL LOW (ref >60)
GFR, EST AFRICAN AMERICAN: 56 mL/min — AB (ref >60)
GLUCOSE: 68 mg/dL (ref 65–99)
Potassium: 3.9 mmol/L (ref 3.5–5.1)
SODIUM: 144 mmol/L (ref 135–145)
Total Bilirubin: 0.4 mg/dL (ref 0.3–1.2)
Total Protein: 6.1 g/dL — ABNORMAL LOW (ref 6.5–8.1)

## 2017-09-22 LAB — LACTATE DEHYDROGENASE: LDH: 182 U/L (ref 98–192)

## 2017-09-22 LAB — URIC ACID: Uric Acid, Serum: 3.9 mg/dL (ref 2.3–6.6)

## 2017-09-22 MED ORDER — HEPARIN SOD (PORK) LOCK FLUSH 100 UNIT/ML IV SOLN
500.0000 [IU] | Freq: Once | INTRAVENOUS | Status: AC
  Administered 2017-09-22: 500 [IU] via INTRAVENOUS
  Filled 2017-09-22: qty 5

## 2017-09-22 MED ORDER — SODIUM CHLORIDE 0.9% FLUSH
10.0000 mL | INTRAVENOUS | Status: DC | PRN
  Administered 2017-09-22: 10 mL via INTRAVENOUS
  Filled 2017-09-22: qty 10

## 2017-09-22 NOTE — Patient Instructions (Signed)
[facility] Discharge Instructions  RECOMMENDATIONS MADE BY THE CONSULTANT AND ANY TEST RESULTS WILL BE SENT TO YOUR REFERRING PHYSICIAN.  You were seen today by Dr. Louise Zhou    Thank you for choosing [facility] at [facility] to provide your oncology and hematology care.  To afford each patient quality time with our provider, please arrive at least 15 minutes before your scheduled appointment time.    If you have a lab appointment with the Cancer Center please come in thru the  Main Entrance and check in at the main information desk  You need to re-schedule your appointment should you arrive 10 or more minutes late.  We strive to give you quality time with our providers, and arriving late affects you and other patients whose appointments are after yours.  Also, if you no show three or more times for appointments you may be dismissed from the clinic at the providers discretion.     Again, thank you for choosing [facility].  Our hope is that these requests will decrease the amount of time that you wait before being seen by our physicians.       _____________________________________________________________  Should you have questions after your visit to [facility], please contact our office at [phone] between the hours of 8:30 a.m. and 4:30 p.m.  Voicemails left after 4:30 p.m. will not be returned until the following business day.  For prescription refill requests, have your pharmacy contact our office.       Resources For Cancer Patients and their Caregivers ? American Cancer Society: Can assist with transportation, wigs, general needs, runs Look Good Feel Better.        1-888-227-6333 ? Cancer Care: Provides financial assistance, online support groups, medication/co-pay assistance.  1-800-813-HOPE (4673) ? [facility] Assists [place] cancer patients and their  families through emotional , educational and financial support.  336-427-4357 ? [place] DSS Where to apply for food stamps, Medicaid and utility assistance. 336-342-1394 ? [facility]: Transportation to medical appointments. 336-347-2287 ? Social Security Administration: May apply for disability if have a Stage IV cancer. 336-342-7796 1-800-772-1213 ? [place] Aging, Disability and Transit Services: Assists with nutrition, care and transit needs. 336-349-2343  Cancer Center Support Programs: [email or web address]@ > Cancer Support Group  2nd Tuesday of the month 1pm-2pm, Journey Room  > Creative Journey  3rd Tuesday of the month 1130am-1pm, Journey Room  > Look Good Feel Better  1st Wednesday of the month 10am-12 noon, Journey Room (Call American Cancer Society to register 1-800-395-5775)    

## 2017-09-22 NOTE — Progress Notes (Signed)
[name] presented for Portacath access and flush.  Portacath located left  chest wall accessed with  H 20 needle. Good blood return present. Portacath flushed with 55ml NS and 500U/15ml Heparin and needle removed intact. Procedure without incident. Patient tolerated procedure well.  Treatment given per orders. Patient tolerated it well without problems. Vitals stable and discharged home from clinic via wheelchair. Follow up as scheduled.

## 2017-09-22 NOTE — Progress Notes (Signed)
[place], [place] [address]   CLINIC:  Medical Oncology/Hematology  PCP:  Fayrene Helper, MD [address], [address] [place] [address] [phone]   REASON FOR VISIT:  Follow-up for Diffuse Large B-cell Lymphoma (DLBCL):   CURRENT THERAPY: R-CHOP  BRIEF ONCOLOGIC HISTORY:    Diffuse large B cell lymphoma ([place])   12/20/2016 Imaging    CT abd/pelvis: IMPRESSION: 1. Extensive abdominal, pelvic and retrocrural lymphadenopathy, most pronounced in the retroperitoneum, as detailed above. Findings are highly suspicious for lymphoproliferative disorder and further evaluation is strongly recommended at this time. 2. Colonic diverticulosis without evidence of acute diverticulitis at this time. 3. Aortic atherosclerosis. 4. Additional incidental findings, as above.      12/30/2016 PET scan    IMPRESSION: 1. Extensive hypermetabolic abdominal and pelvic adenopathy, including bulky retroperitoneal adenopathy lifting of the abdominal aorta away from the spine. One of the larger lymph nodes measures 8.4 cm in short axis with maximum SUV 25.0. 2. Small pleural focus of tumor in the left posteromedial chest, maximum SUV only 4.2. This represents the only thoracic involvement. 3. Other imaging findings of potential clinical significance: Atherosclerotic aortic arch. Mild cardiomegaly. Mild atelectasis in both lower lobes. Hyperdense left renal cysts as on prior CT. Mild left hydronephrosis likely due to extrinsic mass effect on the left ureter. Right greater than left degenerative arthropathy of the hips.      01/31/2017 Surgery    Exploratory lap Arnoldo Morale)       01/31/2017 Pathology Results    Lymph node biospy: DLBCL, high-grade, monoclonal kappa restricted.       02/07/2017 Initial Diagnosis    Diffuse large B cell lymphoma ([place])      02/14/2017 Echocardiogram    Pre-chemo ECHO: EF 55%.       03/01/2017 Bone Marrow Biopsy   No bone marrow involvement by lymphoma. [facility] cytogenetics      03/01/2017 -  Chemotherapy    The patient had DOXOrubicin (ADRIAMYCIN) chemo injection 90 mg, 50 mg/m2 = 90 mg, Intravenous,  Once, 1 of 6 cycles  palonosetron (ALOXI) injection 0.25 mg, 0.25 mg, Intravenous,  Once, 1 of 6 cycles  pegfilgrastim (NEULASTA ONPRO KIT) injection 6 mg, 6 mg, Subcutaneous, Once, 1 of 6 cycles  vinCRIStine (ONCOVIN) 2 mg in sodium chloride 0.9 % 50 mL chemo infusion, 2 mg, Intravenous,  Once, 1 of 6 cycles  riTUXimab (RITUXAN) 700 mg in sodium chloride 0.9 % 250 mL (2.1875 mg/mL) chemo infusion, 375 mg/m2 = 700 mg, Intravenous,  Once, 1 of 6 cycles Dose modification: 375 mg/m2 (original dose 375 mg/m2, Cycle 1)  cyclophosphamide (CYTOXAN) 1,340 mg in sodium chloride 0.9 % 250 mL chemo infusion, 750 mg/m2 = 1,340 mg, Intravenous,  Once, 1 of 6 cycles  for chemotherapy treatment.        04/29/2017 PET scan    1. Overall marked improvement with prominent reduction in size and activity of the retroperitoneal and pelvic adenopathy. The dominant prior periaortic node has reduced in size from 8.4 cm in short axis to 3.4 cm, and from an SUV of 15.02 current SUV of 3.7. This current SUV qualifies as Deauville 4 disease. 2. Prior focus of pleural activity in the left hemithorax has resolved. 3. The hydronephrosis in the left kidney has resolved although there could still be some mild residual collecting system wall thickening. 4. Other imaging findings of potential clinical significance: Aortic Atherosclerosis (ICD10-I70.0). Cardiomegaly. Prominent stool throughout the  colon favors constipation. Small complex left renal cysts. Degenerative arthropathy of both hips. Lumbar spondylosis and degenerative disc disease.      06/27/2017 PET scan    IMPRESSION: 1. Slight interval decrease and FDG accumulation in the abdominal retroperitoneal lymphadenopathy in the small index left external iliac pelvic  sidewall lymph node with minimal associated decrease in size on CT imaging. Based on FDG accumulation within the sites of disease on today's study, the Deauville score is 2. 2.  Aortic Atherosclerois (ICD10-170.0)      09/19/2017 PET scan    1. Similar isolated abdominal retroperitoneal hypermetabolic adenopathy. 2. No new or progressive disease identified.        INTERVAL HISTORY:  [name] 83 y.o. female returns for follow-up and to review results of recent imaging and pathology.   Patient presents alone today for follow up. She states she has been doing ok. She continues to have fatigue. Her appetite is fair. She continues to have issues with constipation. Intermittent dizziness. 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  She denies any chest pain, shortness breath, abdominal pain, weight loss, drenching night sweats, recent infections.  REVIEW OF SYSTEMS:  Review of Systems  Constitutional: Positive for fatigue. Negative for appetite change, diaphoresis, fever and unexpected weight change.  HENT:  Negative.   Eyes: Negative.   Respiratory: Negative.  Negative for shortness of breath.   Cardiovascular: Positive for leg swelling (chronic in left leg).  Gastrointestinal: Positive for constipation (uses laxatives). Negative for abdominal pain.  Endocrine: Negative.  Negative for hot flashes.  Genitourinary: Negative.    Musculoskeletal: Negative.   Neurological: Positive for dizziness and numbness (in fingertips).  Hematological: Negative.   Psychiatric/Behavioral: Negative.      PAST MEDICAL/SURGICAL HISTORY:  Past Medical History:  Diagnosis Date  . Arthritis of knee    bilateral    . Chronic renal insufficiency   . Diabetes mellitus, type 2 ([place])   . GERD (gastroesophageal reflux disease)   . History of kidney stones   . Hypertension   . Iron deficiency anemia 12/28/2016  . Lymphadenopathy, abdominal 12/28/2016  . Osteopenia   . PAD  (peripheral artery disease) ([place])   . Personal history of colonic polyps 3 years ago    colonoscopy    Past Surgical History:  Procedure Laterality Date  . BIOPSY  12/16/2016   Procedure: BIOPSY;  Surgeon: Rogene Houston, MD;  [facility];  Service: Endoscopy;;  gastric polyp  . bunionectomy bilateral  feet    . COLONOSCOPY N/A 12/16/2016   Procedure: COLONOSCOPY;  Surgeon: Rogene Houston, MD;  [facility];  Service: Endoscopy;  Laterality: N/A;  2:15  . DENTAL SURGERY    . ESOPHAGOGASTRODUODENOSCOPY N/A 12/16/2016   Procedure: ESOPHAGOGASTRODUODENOSCOPY (EGD);  Surgeon: Rogene Houston, MD;  [facility];  Service: Endoscopy;  Laterality: N/A;  . gum disease  2010   treated by Dr. Andree Elk   . LAPAROTOMY N/A 01/31/2017   Procedure: EXPLORATORY LAPAROTOMY;  Surgeon: Aviva Signs, MD;  Location: [facility];  Service: General;  Laterality: N/A;  . Left knee manipulation  under anasthesia due to contracture -  03/08/08   Dr. Aline Brochure   . left knee replacement  08/14/08   Dr. Aline Brochure   . LYMPH NODE BIOPSY  01/31/2017   Procedure: LYMPH NODE BIOPSY;  Surgeon: Aviva Signs, MD;  Location: [facility];  Service: General;;  . PORTACATH PLACEMENT Left 02/21/2017   Procedure:  INSERTION PORT-A-CATH;  Surgeon: Aviva Signs, MD;  Location: [facility];  Service: General;  Laterality: Left;  . TOTAL ABDOMINAL HYSTERECTOMY     bleeding   . TOTAL KNEE ARTHROPLASTY Right 05/12/2016   Procedure: TOTAL KNEE ARTHROPLASTY;  Surgeon: [name], MD;  Location: [facility];  Service: Orthopedics;  Laterality: Right;  . WISDOM TOOTH EXTRACTION       SOCIAL HISTORY:  Social History   Socioeconomic History  . Marital status: Widowed    Spouse name: Not on file  . Number of children: 3  . Years of education: college   . Highest education level: Not on file  Social Needs  . Financial resource strain: Not on file  . Food insecurity - worry: Not on file  . Food insecurity - inability: Not on  file  . Transportation needs - medical: Not on file  . Transportation needs - non-medical: Not on file  Occupational History  . Occupation: retired Therapist, music: RETIRED  Tobacco Use  . Smoking status: Former Smoker    Packs/day: 0.25    Years: 30.00    Pack years: 7.50    Types: Cigarettes    Last attempt to quit: 10/19/1999    Years since quitting: 17.9  . Smokeless tobacco: Never Used  Substance and Sexual Activity  . Alcohol use: No  . Drug use: No  . Sexual activity: Not Currently    Comment: widowed  Other Topics Concern  . Not on file  Social History Narrative  . Not on file    FAMILY HISTORY:  Family History  Problem Relation Age of Onset  . Colon cancer Father   . Cancer Mother   . Stroke Mother   . Hypertension Brother   . Dementia Sister   . Multiple sclerosis Daughter   . Pseudotumor cerebri Daughter   . Neuropathy Daughter     CURRENT MEDICATIONS:  Outpatient Encounter Prescriptions as of 02/08/2017  Medication Sig Note  . acetaminophen (TYLENOL) 325 MG tablet Take 650 mg by mouth every 4 (four) hours as needed for mild pain.    Marland Kitchen aspirin EC 81 MG tablet Take 81 mg by mouth daily. 01/24/2017: On hold due to upcoming procedure.  . Calcium Carb-Cholecalciferol (CALCIUM 600+D) 600-800 MG-UNIT TABS Take 1 tablet by mouth 2 (two) times daily.   . diphenhydrAMINE (BENADRYL) 50 MG tablet Take 50 mg by mouth at bedtime as needed for itching.   Marland Kitchen lisinopril-hydrochlorothiazide (PRINZIDE,ZESTORETIC) 20-12.5 MG tablet TAKE ONE TABLET BY MOUTH ONCE DAILY   . magnesium hydroxide (MILK OF MAGNESIA) 400 MG/5ML suspension Take 30 mLs by mouth every other day.   . multivitamin (THERAGRAN) per tablet Take 1 tablet by mouth daily.   01/24/2017: Time varies  . pantoprazole (PROTONIX) 40 MG tablet Take 1 tablet (40 mg total) by mouth daily before breakfast.   . Pediatric Multivitamins-Iron (FLINTSTONES PLUS IRON) chewable tablet Chew 1 tablet by mouth 2 (two) times  daily.   . polyethylene glycol (MIRALAX / GLYCOLAX) packet Take 17 g by mouth daily as needed (constipation).  12/14/2016: Hasn't started yet.  . traMADol (ULTRAM) 50 MG tablet Take 1 tablet (50 mg total) by mouth every 6 (six) hours as needed for moderate pain.    No facility-administered encounter medications on file as of 02/08/2017.     ALLERGIES:  Allergies  Allergen Reactions  . Codeine Hives  . Fosamax [Alendronate Sodium] Other (See Comments)    Pt reports excessive urination  on the day she took the pill     PHYSICAL EXAM:  ECOG Performance status: 2 - Symptomatic; requires occasional assistance    Physical Exam  Constitutional: She is oriented to person, place, and time.  HENT:  Head: Normocephalic and atraumatic.  Mouth/Throat: Oropharynx is clear and moist.  Eyes: Conjunctivae are normal. Pupils are equal, round, and reactive to light.  Neck: Normal range of motion. Neck supple.  Cardiovascular: Normal rate and regular rhythm. Exam reveals no gallop and no friction rub.  No murmur heard. Pulmonary/Chest: Effort normal. No respiratory distress. She has no wheezes. She has no rales. She exhibits no tenderness.  Abdominal: Soft. Bowel sounds are normal. She exhibits no distension. There is no tenderness. There is no rebound.  Musculoskeletal: Normal range of motion.  Left 3+ pitting pedal edema, 2+ pitting edema on right  Lymphadenopathy:    She has no axillary adenopathy.       Right: No supraclavicular adenopathy present.       Left: No supraclavicular adenopathy present.  Neurological: She is alert and oriented to person, place, and time.  Skin: Skin is warm and dry.  Psychiatric: Mood, memory, affect and judgment normal.  Nursing note and vitals reviewed.    LABORATORY DATA:  I have reviewed the labs as listed.  CBC    Component Value Date/Time   WBC 3.8 (L) 06/14/2017 0939   RBC 2.67 (L) 06/14/2017 0939   HGB 9.1 (L) 06/14/2017 0939   HCT 28.0 (L)  06/14/2017 0939   PLT 272 06/14/2017 0939   MCV 104.9 (H) 06/14/2017 0939   MCH 34.1 (H) 06/14/2017 0939   MCHC 32.5 06/14/2017 0939   RDW 17.7 (H) 06/14/2017 0939   LYMPHSABS 0.8 06/14/2017 0939   MONOABS 0.6 06/14/2017 0939   EOSABS 0.0 06/14/2017 0939   BASOSABS 0.0 06/14/2017 0939   CMP Latest Ref Rng & Units 06/14/2017 05/24/2017 05/03/2017  Glucose 65 - 99 mg/dL 142(H) 116(H) 127(H)  BUN 6 - 20 mg/dL 18 29(H) 26(H)  Creatinine 0.44 - 1.00 mg/dL 0.94 0.93 1.02(H)  Sodium 135 - 145 mmol/L 140 142 141  Potassium 3.5 - 5.1 mmol/L 4.0 3.5 3.6  Chloride 101 - 111 mmol/L 104 107 103  CO2 22 - 32 mmol/L 29 29 30   Calcium 8.9 - 10.3 mg/dL 9.0 9.1 9.1  Total Protein 6.5 - 8.1 g/dL 5.7(L) 5.8(L) 5.8(L)  Total Bilirubin 0.3 - 1.2 mg/dL 0.4 0.5 0.5  Alkaline Phos 38 - 126 U/L 59 59 57  AST 15 - 41 U/L 24 24 27   ALT 14 - 54 U/L 13(L) 13(L) 14    PENDING LABS:    DIAGNOSTIC IMAGING:  PET scan: 12/30/16       PATHOLOGY:  Lymph node biopsy: 01/31/17    Cytology: 01/31/17    ASSESSMENT & PLAN:  Stage IV Diffuse Large B-cell Lymphoma (DLBCL) with small pleural tumor focus on the posteromedial aspect of left chest; no bone marrow involvement. IPI score of 3. She is in the high to intermediate risk group. Predicted 4 year progression free survival rate is 43%.   Decreased her vincristine to 2m from 274mfrom cycle 2 onwards  due to worsening neuropathy.  6 cycles of R-CHOP from 03/01/17 to 06/14/17. Post treatment PET shows similar appearance of the bulky abdominal retroperitoneal lymphadenopathy. Index left para-aortic lymph node measured at 3.4 cm on the prior study measures 3.2 cm at the same level and in the same dimension today. SUV max =  2.4 for this lymph node compare to 3.7 previously. The index left external iliac lymph node measured previously at 0.9 cm short axis now measures 0.8 cm (image 135 series 4). SUV max =1.8 on today's exam compared to 2.0 previously. No evidence  for progressive abdominopelvic lymphadenopathy on CT images. No new or progressive hypermetabolic disease in the abdomen or pelvis.   PLAN -Patient is not a candidate for high dose therapy or transplant. -I have reviewed patient's PET scan in detail with her today. She has a similar isolated abdominal retroperitoneal hypermetabolic adenopathy, no new or progressive disease identified. She had met with radiation oncology who stated that she is not a candidate for RT to the abdominal retroperitoneal adenopathy.  -Patient continues to look very frail and I am concerned about her tolerance if we should proceed with 2nd line chemotherapy with bendamustine and rituxan for refractory disease. I have discussed starting chemo again vs. Observation to see if the adenopathy worsens, patient has opted for observation.  -I believe patient has very poor insight into the disease, despite multiple conversations regarding the aggressiveness of DLBCL and overall poor prognosis should she have residual disease. -RTC in 4 months for follow up with CBC, CMP, LDH and PET-CT 1-2 days prior to her next visit.  Orders Placed This Encounter  Procedures  . NM PET Image Initial (PI) Skull Base To Thigh    Standing Status:   Future    Standing Expiration Date:   09/22/2018    Order Specific Question:   If indicated for the ordered procedure, I authorize the administration of a radiopharmaceutical per Radiology protocol    Answer:   Yes    Order Specific Question:   Preferred imaging location?    Answer:   [facility]    Order Specific Question:   Radiology Contrast Protocol - do NOT remove file path    Answer:   [email or web address]    Order Specific Question:   Reason for Exam additional comments    Answer:   continued surveillance of intraabdominal lymphadenopathy  . CBC with Differential    Standing Status:   Future    Standing Expiration Date:   09/22/2018  . Comprehensive  metabolic panel    Standing Status:   Future    Standing Expiration Date:   09/22/2018  . Lactate dehydrogenase    Standing Status:   Future    Standing Expiration Date:   09/22/2018        All questions were answered to patient's stated satisfaction. Encouraged patient to call with any new concerns or questions before her next visit to the cancer center and we can certain see her sooner, if needed.      This note was electronically signed by:  Twana First, MD 09/22/17

## 2017-11-23 ENCOUNTER — Encounter (HOSPITAL_COMMUNITY): Payer: Medicare Other

## 2017-12-01 ENCOUNTER — Encounter (HOSPITAL_COMMUNITY): Payer: Self-pay

## 2017-12-01 ENCOUNTER — Other Ambulatory Visit: Payer: Self-pay

## 2017-12-01 ENCOUNTER — Inpatient Hospital Stay (HOSPITAL_COMMUNITY): Payer: Medicare Other | Attending: Internal Medicine

## 2017-12-01 DIAGNOSIS — Z452 Encounter for adjustment and management of vascular access device: Secondary | ICD-10-CM | POA: Insufficient documentation

## 2017-12-01 DIAGNOSIS — C8330 Diffuse large B-cell lymphoma, unspecified site: Secondary | ICD-10-CM | POA: Diagnosis not present

## 2017-12-01 MED ORDER — HEPARIN SOD (PORK) LOCK FLUSH 100 UNIT/ML IV SOLN
500.0000 [IU] | Freq: Once | INTRAVENOUS | Status: AC
  Administered 2017-12-01: 500 [IU] via INTRAVENOUS

## 2017-12-01 MED ORDER — SODIUM CHLORIDE 0.9% FLUSH
10.0000 mL | INTRAVENOUS | Status: DC | PRN
  Administered 2017-12-01: 10 mL via INTRAVENOUS
  Filled 2017-12-01: qty 10

## 2017-12-01 NOTE — Patient Instructions (Signed)
[facility] at [facility] Discharge Instructions  RECOMMENDATIONS MADE BY THE CONSULTANT AND ANY TEST RESULTS WILL BE SENT TO YOUR REFERRING PHYSICIAN.  Port flush done today. Follow up as scheduled.  Thank you for choosing [facility] at [facility] to provide your oncology and hematology care.  To afford each patient quality time with our provider, please arrive at least 15 minutes before your scheduled appointment time.    If you have a lab appointment with the Cancer Center please come in thru the  Main Entrance and check in at the main information desk  You need to re-schedule your appointment should you arrive 10 or more minutes late.  We strive to give you quality time with our providers, and arriving late affects you and other patients whose appointments are after yours.  Also, if you no show three or more times for appointments you may be dismissed from the clinic at the providers discretion.     Again, thank you for choosing [facility].  Our hope is that these requests will decrease the amount of time that you wait before being seen by our physicians.       _____________________________________________________________  Should you have questions after your visit to [facility], please contact our office at [phone] between the hours of 8:30 a.m. and 4:30 p.m.  Voicemails left after 4:30 p.m. will not be returned until the following business day.  For prescription refill requests, have your pharmacy contact our office.       Resources For Cancer Patients and their Caregivers ? American Cancer Society: Can assist with transportation, wigs, general needs, runs Look Good Feel Better.        1-888-227-6333 ? Cancer Care: Provides financial assistance, online support groups, medication/co-pay assistance.  1-800-813-HOPE (4673) ? [facility] Assists [place] cancer patients and  their families through emotional , educational and financial support.  336-427-4357 ? [place] DSS Where to apply for food stamps, Medicaid and utility assistance. 336-342-1394 ? [facility]: Transportation to medical appointments. 336-347-2287 ? Social Security Administration: May apply for disability if have a Stage IV cancer. 336-342-7796 1-800-772-1213 ? [place] Aging, Disability and Transit Services: Assists with nutrition, care and transit needs. 336-349-2343  Cancer Center Support Programs: @10RELATIVEDAYS@ > Cancer Support Group  2nd Tuesday of the month 1pm-2pm, Journey Room  > Creative Journey  3rd Tuesday of the month 1130am-1pm, Journey Room  > Look Good Feel Better  1st Wednesday of the month 10am-12 noon, Journey Room (Call American Cancer Society to register 1-800-395-5775)   

## 2017-12-20 ENCOUNTER — Telehealth: Payer: Self-pay

## 2017-12-20 NOTE — Telephone Encounter (Signed)
pls call in to be seen by me itoday or tomorrow

## 2017-12-20 NOTE — Telephone Encounter (Signed)
Ann from the [facility] cancer center called and Dr Joya Gaskins is requesting to speak to you about [name]. Call back number is [phone]

## 2017-12-21 ENCOUNTER — Encounter: Payer: Self-pay | Admitting: Family Medicine

## 2017-12-21 ENCOUNTER — Ambulatory Visit (INDEPENDENT_AMBULATORY_CARE_PROVIDER_SITE_OTHER): Payer: Medicare Other | Admitting: Family Medicine

## 2017-12-21 VITALS — BP 118/80 | HR 81 | Resp 16 | Ht 66.0 in | Wt 152.0 lb

## 2017-12-21 DIAGNOSIS — M150 Primary generalized (osteo)arthritis: Secondary | ICD-10-CM | POA: Diagnosis not present

## 2017-12-21 DIAGNOSIS — R310 Gross hematuria: Secondary | ICD-10-CM

## 2017-12-21 DIAGNOSIS — R109 Unspecified abdominal pain: Secondary | ICD-10-CM

## 2017-12-21 DIAGNOSIS — G8929 Other chronic pain: Secondary | ICD-10-CM

## 2017-12-21 DIAGNOSIS — R319 Hematuria, unspecified: Secondary | ICD-10-CM

## 2017-12-21 DIAGNOSIS — I10 Essential (primary) hypertension: Secondary | ICD-10-CM | POA: Diagnosis not present

## 2017-12-21 DIAGNOSIS — M159 Polyosteoarthritis, unspecified: Secondary | ICD-10-CM

## 2017-12-21 DIAGNOSIS — R3981 Functional urinary incontinence: Secondary | ICD-10-CM

## 2017-12-21 MED ORDER — UNABLE TO FIND: 5 refills | Status: AC

## 2017-12-21 NOTE — Assessment & Plan Note (Signed)
Needs incontinence supplies , and these will be ordered , significant urinary incontinence

## 2017-12-21 NOTE — Assessment & Plan Note (Addendum)
2 week hiistory of gross hematuria, started Feb 22, with frequency, , some pressure and left flank pain.  Renal US asap, urology eval asap and uA  and culture

## 2017-12-21 NOTE — Assessment & Plan Note (Signed)
Controlled, no change in medication  

## 2017-12-21 NOTE — Progress Notes (Signed)
   [name]     MRN: [id number]      DOB: 03/21/1934   HPI [name] is here for follow up and re-evaluation of chronic medical conditions, medication management and review of any available recent lab and radiology data.  2 week h/o painless hematuria which started  On feb 22,, notes increased frequency, every 1 hour, small amounts, denies fever or chills, c/o left flank pain  Has recently been treated for diffuse large B cell lymphoma and call came in from oncology yesrterday asking for help Has chronic incontinence and requests helps wit incontinence supplies, al;so due to poor mobility will benefit from in home assistance Improved appetite with weight gain  ROS Denies recent fever or chills. Denies sinus pressure, nasal congestion, ear pain or sore throat. Denies chest congestion, productive cough or wheezing. Denies chest pains, palpitations and leg swelling Denies abdominal pain, nausea, vomiting,diarrhea or constipation.   . Denies headaches, seizures, numbness, or tingling. Denies depression, anxiety or insomnia. Denies skin break down or rash.   PE  BP 118/80   Pulse 81   Resp 16   Ht 5\' 6"  (1.676 m)   Wt 152 lb (68.9 kg)   SpO2 97%   BMI 24.53 kg/m   Patient alert and oriented and in no cardiopulmonary distress.  HEENT: No facial asymmetry, EOMI,   oropharynx pink and moist.  Neck supple no JVD, no mass.  Chest: Clear to auscultation bilaterally.  CVS: S1, S2 no murmurs, no S3.Regular rate.  ABD: Soft left renal angle tenderness Ext: No edema  MS: decreased ROM spine  hips and knees  Skin: Intact, no ulcerations or rash noted.  Psych: Good eye contact, normal affect.  not anxious or depressed appearing.  CNS: CN 2-12 intact, power,  normal throughout.no focal deficits noted.   Assessment & Plan  Hematuria, gross 2 week hiistory of gross hematuria, started Feb 22, with frequency, , some pressure and left flank pain.  Renal US asap, urology eval  asap and uA  and culture  Urinary incontinence Needs incontinence supplies , and these will be ordered , significant urinary incontinence  Essential hypertension Controlled, no change in medication   Osteoarthritis Marked limitation in mobility, needs and will benefit from in home help

## 2017-12-21 NOTE — Patient Instructions (Addendum)
Wellness with Nurse June 7 or after  Physical exam with MD in 4.5  Months, call iof you need me sooner  You need an [place] of your kidney and are referred to urologist, we will be calling with appointments soon   Please return urine for testing asoon as possible  Incontinence supplies are sent in, you qualify for in home help

## 2017-12-21 NOTE — Assessment & Plan Note (Addendum)
Marked limitation in mobility, needs and will benefit from in home help

## 2017-12-23 ENCOUNTER — Other Ambulatory Visit: Payer: Self-pay | Admitting: Family Medicine

## 2017-12-23 LAB — URINE CULTURE
MICRO NUMBER:: 90295308
SPECIMEN QUALITY: ADEQUATE

## 2017-12-23 LAB — URINALYSIS
BILIRUBIN URINE: NEGATIVE
Glucose, UA: NEGATIVE
KETONES UR: NEGATIVE
NITRITE: POSITIVE — AB
Specific Gravity, Urine: 1.021 (ref 1.001–1.03)
pH: 8 (ref 5.0–8.0)

## 2017-12-23 MED ORDER — CIPROFLOXACIN HCL 500 MG PO TABS: 500.0000 mg | ORAL_TABLET | Freq: Two times a day (BID) | ORAL | 0 refills | Status: DC

## 2017-12-23 NOTE — Progress Notes (Signed)
Pt notified that she may have bladder infection and med is sent that she needs to start today

## 2017-12-26 ENCOUNTER — Ambulatory Visit (HOSPITAL_COMMUNITY)
Admission: RE | Admit: 2017-12-26 | Discharge: 2017-12-26 | Disposition: A | Discharge status: Home / Self Care | Payer: Medicare Other | Source: Ambulatory Visit | Attending: Family Medicine | Admitting: Family Medicine

## 2017-12-26 DIAGNOSIS — R109 Unspecified abdominal pain: Secondary | ICD-10-CM | POA: Diagnosis present

## 2017-12-26 DIAGNOSIS — R10A2 Flank pain, left side: Secondary | ICD-10-CM

## 2017-12-26 DIAGNOSIS — R310 Gross hematuria: Secondary | ICD-10-CM

## 2017-12-26 DIAGNOSIS — G8929 Other chronic pain: Secondary | ICD-10-CM | POA: Diagnosis present

## 2017-12-28 ENCOUNTER — Telehealth: Payer: Self-pay

## 2017-12-28 DIAGNOSIS — N3000 Acute cystitis without hematuria: Secondary | ICD-10-CM

## 2017-12-28 NOTE — Telephone Encounter (Signed)
-----   Message from Fayrene Helper, MD sent at 12/27/2017  3:46 PM EDT ----- Please let pt know kidneys look normal except for cysts, NO masses  her urine did not grow bacteria , multiple species. She nmeeds a repeat cCUA sent to lab to see if she still has blood in the urine Does have some mild thickening of wall of bladder noted

## 2017-12-29 ENCOUNTER — Ambulatory Visit: Payer: Medicare Other | Admitting: Family Medicine

## 2018-01-03 ENCOUNTER — Ambulatory Visit: Payer: Medicare Other | Admitting: Urology

## 2018-01-16 ENCOUNTER — Other Ambulatory Visit (HOSPITAL_COMMUNITY): Payer: Self-pay | Admitting: Hematology

## 2018-01-16 DIAGNOSIS — C8333 Diffuse large B-cell lymphoma, intra-abdominal lymph nodes: Secondary | ICD-10-CM

## 2018-01-18 ENCOUNTER — Inpatient Hospital Stay (HOSPITAL_COMMUNITY): Payer: Medicare Other | Attending: Internal Medicine

## 2018-01-18 ENCOUNTER — Encounter (HOSPITAL_COMMUNITY): Payer: Self-pay

## 2018-01-18 DIAGNOSIS — Z87891 Personal history of nicotine dependence: Secondary | ICD-10-CM | POA: Insufficient documentation

## 2018-01-18 DIAGNOSIS — N189 Chronic kidney disease, unspecified: Secondary | ICD-10-CM | POA: Diagnosis not present

## 2018-01-18 DIAGNOSIS — C8333 Diffuse large B-cell lymphoma, intra-abdominal lymph nodes: Secondary | ICD-10-CM | POA: Insufficient documentation

## 2018-01-18 LAB — CBC WITH DIFFERENTIAL/PLATELET
BASOS ABS: 0 10*3/uL (ref 0.0–0.1)
Basophils Relative: 0 %
Eosinophils Absolute: 0.1 10*3/uL (ref 0.0–0.7)
Eosinophils Relative: 3 %
HCT: 31.2 % — ABNORMAL LOW (ref 36.0–46.0)
HEMOGLOBIN: 10 g/dL — AB (ref 12.0–15.0)
LYMPHS PCT: 16 %
Lymphs Abs: 0.4 10*3/uL — ABNORMAL LOW (ref 0.7–4.0)
MCH: 32.9 pg (ref 26.0–34.0)
MCHC: 32.1 g/dL (ref 30.0–36.0)
MCV: 102.6 fL — AB (ref 78.0–100.0)
Monocytes Absolute: 0.3 10*3/uL (ref 0.1–1.0)
Monocytes Relative: 13 %
NEUTROS ABS: 1.6 10*3/uL — AB (ref 1.7–7.7)
Neutrophils Relative %: 68 %
Platelets: 189 10*3/uL (ref 150–400)
RBC: 3.04 MIL/uL — AB (ref 3.87–5.11)
RDW: 13.3 % (ref 11.5–15.5)
WBC: 2.3 10*3/uL — AB (ref 4.0–10.5)

## 2018-01-18 LAB — COMPREHENSIVE METABOLIC PANEL
ALBUMIN: 3.2 g/dL — AB (ref 3.5–5.0)
ALT: 17 U/L (ref 14–54)
AST: 24 U/L (ref 15–41)
Alkaline Phosphatase: 70 U/L (ref 38–126)
Anion gap: 8 (ref 5–15)
BILIRUBIN TOTAL: 0.6 mg/dL (ref 0.3–1.2)
BUN: 28 mg/dL — ABNORMAL HIGH (ref 6–20)
CO2: 28 mmol/L (ref 22–32)
Calcium: 9.1 mg/dL (ref 8.9–10.3)
Chloride: 108 mmol/L (ref 101–111)
Creatinine, Ser: 1.22 mg/dL — ABNORMAL HIGH (ref 0.44–1.00)
GFR calc Af Amer: 47 mL/min — ABNORMAL LOW (ref >60)
GFR, EST NON AFRICAN AMERICAN: 40 mL/min — AB (ref >60)
Glucose, Bld: 95 mg/dL (ref 65–99)
POTASSIUM: 4 mmol/L (ref 3.5–5.1)
Sodium: 144 mmol/L (ref 135–145)
TOTAL PROTEIN: 6.1 g/dL — AB (ref 6.5–8.1)

## 2018-01-18 LAB — LACTATE DEHYDROGENASE: LDH: 218 U/L — AB (ref 98–192)

## 2018-01-18 MED ORDER — SODIUM CHLORIDE 0.9% FLUSH
20.0000 mL | INTRAVENOUS | Status: DC | PRN
  Administered 2018-01-18: 20 mL via INTRAVENOUS
  Filled 2018-01-18: qty 20

## 2018-01-18 MED ORDER — HEPARIN SOD (PORK) LOCK FLUSH 100 UNIT/ML IV SOLN
500.0000 [IU] | Freq: Once | INTRAVENOUS | Status: AC
  Administered 2018-01-18: 500 [IU] via INTRAVENOUS

## 2018-01-18 NOTE — Progress Notes (Signed)
[name] tolerated port lab draw with flush well without complaints or incident. Port accessed with blood drawn for labs ordered then flushed with 20 ml NS and 5 ml Heparin then de-accessed. VSS Pt discharged via wheelchair in satisfactory condition accompanied by her daughter

## 2018-01-18 NOTE — Patient Instructions (Signed)
[place] at [facility] Discharge Instructions  Labs drawn from portacath then flushed per protocol. Follow-up as scheduled. Call for any questions or concerns   Thank you for choosing [place] at [facility] to provide your oncology and hematology care.  To afford each patient quality time with our provider, please arrive at least 15 minutes before your scheduled appointment time.   If you have a lab appointment with the Center please come in thru the  Main Entrance and check in at the main information desk  You need to re-schedule your appointment should you arrive 10 or more minutes late.  We strive to give you quality time with our providers, and arriving late affects you and other patients whose appointments are after yours.  Also, if you no show three or more times for appointments you may be dismissed from the clinic at the providers discretion.     Again, thank you for choosing [facility].  Our hope is that these requests will decrease the amount of time that you wait before being seen by our physicians.       _____________________________________________________________  Should you have questions after your visit to [facility], please contact our office at [phone] between the hours of 8:30 a.m. and 4:30 p.m.  Voicemails left after 4:30 p.m. will not be returned until the following business day.  For prescription refill requests, have your pharmacy contact our office.       Resources For Cancer Patients and their Caregivers ? American Cancer Society: Can assist with transportation, wigs, general needs, runs Look Good Feel Better.        873 104 7422 ? Cancer Care: Provides financial assistance, online support groups, medication/co-pay assistance.  1-800-813-HOPE 782-233-4683) ? [facility] Assists [place] cancer patients and their families through emotional ,  educational and financial support.  (580)256-4093 ? [place] DSS Where to apply for food stamps, Medicaid and utility assistance. (574) 846-7920 ? [facility]: Transportation to medical appointments. 650-303-5441 ? Social Security Administration: May apply for disability if have a Stage IV cancer. 312-378-6819 (435)080-5579 ? [employer], Disability and Transit Services: Assists with nutrition, care and transit needs. [place] Support Programs:   > Cancer Support Group  2nd Tuesday of the month 1pm-2pm, Journey Room   > Creative Journey  3rd Tuesday of the month 1130am-1pm, Journey Room

## 2018-01-19 ENCOUNTER — Ambulatory Visit (HOSPITAL_COMMUNITY): Payer: Medicare Other

## 2018-01-20 ENCOUNTER — Ambulatory Visit (HOSPITAL_COMMUNITY): Payer: Medicare Other

## 2018-01-23 ENCOUNTER — Ambulatory Visit (HOSPITAL_COMMUNITY): Payer: Medicare Other | Admitting: Hematology

## 2018-01-23 ENCOUNTER — Ambulatory Visit (HOSPITAL_COMMUNITY): Payer: Medicare Other

## 2018-01-26 ENCOUNTER — Ambulatory Visit (HOSPITAL_COMMUNITY)
Admission: RE | Admit: 2018-01-26 | Discharge: 2018-01-26 | Disposition: A | Discharge status: Home / Self Care | Payer: Medicare Other | Source: Ambulatory Visit | Attending: Hematology | Admitting: Hematology

## 2018-01-26 ENCOUNTER — Ambulatory Visit (HOSPITAL_COMMUNITY): Admission: RE | Admit: 2018-01-26 | Payer: Medicare Other | Source: Ambulatory Visit

## 2018-01-26 DIAGNOSIS — K8020 Calculus of gallbladder without cholecystitis without obstruction: Secondary | ICD-10-CM | POA: Diagnosis not present

## 2018-01-26 DIAGNOSIS — C8333 Diffuse large B-cell lymphoma, intra-abdominal lymph nodes: Secondary | ICD-10-CM | POA: Diagnosis present

## 2018-01-26 DIAGNOSIS — I700 Atherosclerosis of aorta: Secondary | ICD-10-CM | POA: Insufficient documentation

## 2018-01-26 MED ORDER — IOPAMIDOL (ISOVUE-300) INJECTION 61%
100.0000 mL | Freq: Once | INTRAVENOUS | Status: AC | PRN
  Administered 2018-01-26: 100 mL via INTRAVENOUS

## 2018-01-27 ENCOUNTER — Inpatient Hospital Stay (HOSPITAL_BASED_OUTPATIENT_CLINIC_OR_DEPARTMENT_OTHER): Payer: Medicare Other | Admitting: Hematology

## 2018-01-27 ENCOUNTER — Encounter (HOSPITAL_COMMUNITY): Payer: Self-pay | Admitting: Hematology

## 2018-01-27 VITALS — BP 125/89 | HR 82 | Temp 98.5°F | Resp 18 | Wt 157.0 lb

## 2018-01-27 DIAGNOSIS — C8333 Diffuse large B-cell lymphoma, intra-abdominal lymph nodes: Secondary | ICD-10-CM | POA: Diagnosis not present

## 2018-01-27 DIAGNOSIS — Z87891 Personal history of nicotine dependence: Secondary | ICD-10-CM | POA: Diagnosis not present

## 2018-01-27 DIAGNOSIS — N189 Chronic kidney disease, unspecified: Secondary | ICD-10-CM | POA: Diagnosis not present

## 2018-01-27 NOTE — Assessment & Plan Note (Signed)
1.  Diffuse large B-cell lymphoma: -6 cycles of R CHOP from 03/01/2017 through 06/14/2017 -Radiation therapy to the lymph nodes from 07/19/2017 through 08/18/2017 in [place] -She does not have any B symptoms including unexplained fevers, night sweats or weight loss.  She currently lives with her daughter and does cooking, laundry and able to bathe herself.  There is no significant decline in her performance status.  She does not report any new onset pains.  I discussed the findings of the CT scan of the chest, abdomen and pelvis dated 01/26/2018 which showed slight improvement from PET scan done in December.  Her LDH was mildly elevated.  Otherwise blood work was within her normal range.  Hence I have recommended a 38-month follow-up with CT scans and blood work.  She was told to come back sooner if any of the above-mentioned symptoms develop.

## 2018-01-27 NOTE — Patient Instructions (Signed)
[facility] at [facility]  Discharge Instructions:  You were seen by Dr. Katragadda today.   _______________________________________________________________  Thank you for choosing [facility] at [facility] to provide your oncology and hematology care.  To afford each patient quality time with our providers, please arrive at least 15 minutes before your scheduled appointment.  You need to re-schedule your appointment if you arrive 10 or more minutes late.  We strive to give you quality time with our providers, and arriving late affects you and other patients whose appointments are after yours.  Also, if you no show three or more times for appointments you may be dismissed from the clinic.  Again, thank you for choosing [facility] at [facility]. Our hope is that these requests will allow you access to exceptional care and in a timely manner. _______________________________________________________________  If you have questions after your visit, please contact our office at [phone] between the hours of 8:30 a.m. and 5:00 p.m. Voicemails left after 4:30 p.m. will not be returned until the following business day. _______________________________________________________________  For prescription refill requests, have your pharmacy contact our office. _______________________________________________________________  Recommendations made by the consultant and any test results will be sent to your referring physician. _______________________________________________________________ 

## 2018-01-27 NOTE — Progress Notes (Signed)
[place], [place] [address]   CLINIC:  Medical Oncology/Hematology  PCP:  Fayrene Helper, MD [address] [place] [address] [phone]   REASON FOR VISIT:  Follow-up for diffuse large B cell lymphoma.  CURRENT THERAPY: Completed chemotherapy.   BRIEF ONCOLOGIC HISTORY:    Diffuse large B cell lymphoma (HCC)   12/20/2016 Imaging    CT abd/pelvis: IMPRESSION: 1. Extensive abdominal, pelvic and retrocrural lymphadenopathy, most pronounced in the retroperitoneum, as detailed above. Findings are highly suspicious for lymphoproliferative disorder and further evaluation is strongly recommended at this time. 2. Colonic diverticulosis without evidence of acute diverticulitis at this time. 3. Aortic atherosclerosis. 4. Additional incidental findings, as above.      12/30/2016 PET scan    IMPRESSION: 1. Extensive hypermetabolic abdominal and pelvic adenopathy, including bulky retroperitoneal adenopathy lifting of the abdominal aorta away from the spine. One of the larger lymph nodes measures 8.4 cm in short axis with maximum SUV 25.0. 2. Small pleural focus of tumor in the left posteromedial chest, maximum SUV only 4.2. This represents the only thoracic involvement. 3. Other imaging findings of potential clinical significance: Atherosclerotic aortic arch. Mild cardiomegaly. Mild atelectasis in both lower lobes. Hyperdense left renal cysts as on prior CT. Mild left hydronephrosis likely due to extrinsic mass effect on the left ureter. Right greater than left degenerative arthropathy of the hips.      01/31/2017 Surgery    Exploratory lap Arnoldo Morale)       01/31/2017 Pathology Results    Lymph node biospy: DLBCL, high-grade, monoclonal kappa restricted.       02/07/2017 Initial Diagnosis    Diffuse large B cell lymphoma ([place])      02/14/2017 Echocardiogram    Pre-chemo ECHO: EF 55%.       03/01/2017 Bone Marrow  Biopsy    No bone marrow involvement by lymphoma. [facility] cytogenetics      03/01/2017 -  Chemotherapy    The patient had DOXOrubicin (ADRIAMYCIN) chemo injection 90 mg, 50 mg/m2 = 90 mg, Intravenous,  Once, 1 of 6 cycles  palonosetron (ALOXI) injection 0.25 mg, 0.25 mg, Intravenous,  Once, 1 of 6 cycles  pegfilgrastim (NEULASTA ONPRO KIT) injection 6 mg, 6 mg, Subcutaneous, Once, 1 of 6 cycles  vinCRIStine (ONCOVIN) 2 mg in sodium chloride 0.9 % 50 mL chemo infusion, 2 mg, Intravenous,  Once, 1 of 6 cycles  riTUXimab (RITUXAN) 700 mg in sodium chloride 0.9 % 250 mL (2.1875 mg/mL) chemo infusion, 375 mg/m2 = 700 mg, Intravenous,  Once, 1 of 6 cycles Dose modification: 375 mg/m2 (original dose 375 mg/m2, Cycle 1)  cyclophosphamide (CYTOXAN) 1,340 mg in sodium chloride 0.9 % 250 mL chemo infusion, 750 mg/m2 = 1,340 mg, Intravenous,  Once, 1 of 6 cycles  for chemotherapy treatment.        04/29/2017 PET scan    1. Overall marked improvement with prominent reduction in size and activity of the retroperitoneal and pelvic adenopathy. The dominant prior periaortic node has reduced in size from 8.4 cm in short axis to 3.4 cm, and from an SUV of 15.02 current SUV of 3.7. This current SUV qualifies as Deauville 4 disease. 2. Prior focus of pleural activity in the left hemithorax has resolved. 3. The hydronephrosis in the left kidney has resolved although there could still be some mild residual collecting system wall thickening. 4. Other imaging findings of potential clinical significance: Aortic Atherosclerosis (ICD10-I70.0). Cardiomegaly. Prominent  stool throughout the colon favors constipation. Small complex left renal cysts. Degenerative arthropathy of both hips. Lumbar spondylosis and degenerative disc disease.      06/27/2017 PET scan    IMPRESSION: 1. Slight interval decrease and FDG accumulation in the abdominal retroperitoneal lymphadenopathy in the small index left  external iliac pelvic sidewall lymph node with minimal associated decrease in size on CT imaging. Based on FDG accumulation within the sites of disease on today's study, the Deauville score is 2. 2.  Aortic Atherosclerois (ICD10-170.0)      09/19/2017 PET scan    1. Similar isolated abdominal retroperitoneal hypermetabolic adenopathy. 2. No new or progressive disease identified.         INTERVAL HISTORY:  [name] 84 y.o. female returns for routine follow-up of large B cell lymphoma.  She denies any fevers, night sweats or weight loss.  She is accompanied by her daughter.  She lives with her daughter and does cooking, laundry, bathes herself.  She is not driving yet.  She denies any recent hospitalizations or infections.  No abdominal pains were reported.  She has some arthritic pains in both knees, although she had knee replacements done.  REVIEW OF SYSTEMS:  Review of Systems  Constitutional: Negative.   HENT:  Negative.   Respiratory: Negative.   Cardiovascular: Negative.   Gastrointestinal: Positive for constipation.  Genitourinary: Negative.    Musculoskeletal: Positive for arthralgias.  Skin: Negative.   Neurological: Negative.   Hematological: Negative.   Psychiatric/Behavioral: Negative.      PAST MEDICAL/SURGICAL HISTORY:  Past Medical History:  Diagnosis Date  . Arthritis of knee    bilateral    . Chronic renal insufficiency   . Diabetes mellitus, type 2 ([place])   . GERD (gastroesophageal reflux disease)   . History of kidney stones   . Hypertension   . Iron deficiency anemia 12/28/2016  . Lymphadenopathy, abdominal 12/28/2016  . Osteopenia   . PAD (peripheral artery disease) ([place])   . Personal history of colonic polyps 3 years ago    colonoscopy    Past Surgical History:  Procedure Laterality Date  . BIOPSY  12/16/2016   Procedure: BIOPSY;  Surgeon: Rogene Houston, MD;  [facility];  Service: Endoscopy;;  gastric polyp  . bunionectomy  bilateral  feet    . COLONOSCOPY N/A 12/16/2016   Procedure: COLONOSCOPY;  Surgeon: Rogene Houston, MD;  Location: [facility];  Service: Endoscopy;  Laterality: N/A;  2:15  . DENTAL SURGERY    . ESOPHAGOGASTRODUODENOSCOPY N/A 12/16/2016   Procedure: ESOPHAGOGASTRODUODENOSCOPY (EGD);  Surgeon: Rogene Houston, MD;  [facility]: [facility];  Service: Endoscopy;  Laterality: N/A;  . gum disease  2010   treated by Dr. Andree Elk   . LAPAROTOMY N/A 01/31/2017   Procedure: EXPLORATORY LAPAROTOMY;  Surgeon: Aviva Signs, MD;  Location: [facility];  Service: General;  Laterality: N/A;  . Left knee manipulation  under anasthesia due to contracture -  03/08/08   Dr. Aline Brochure   . left knee replacement  08/14/08   Dr. Aline Brochure   . LYMPH NODE BIOPSY  01/31/2017   Procedure: LYMPH NODE BIOPSY;  Surgeon: Aviva Signs, MD;  Location: [facility];  Service: General;;  . PORTACATH PLACEMENT Left 02/21/2017   Procedure: INSERTION PORT-A-CATH;  Surgeon: Aviva Signs, MD;  Location: [facility];  Service: General;  Laterality: Left;  . TOTAL ABDOMINAL HYSTERECTOMY     bleeding   . TOTAL KNEE ARTHROPLASTY Right 05/12/2016   Procedure:  TOTAL KNEE ARTHROPLASTY;  Surgeon: [name], MD;  Location: [facility];  Service: Orthopedics;  Laterality: Right;  . WISDOM TOOTH EXTRACTION       SOCIAL HISTORY:  Social History   Socioeconomic History  . Marital status: Widowed    Spouse name: Not on file  . Number of children: 3  . Years of education: college   . Highest education level: Not on file  Occupational History  . Occupation: retired Therapist, music: RETIRED  Social Needs  . Financial resource strain: Not on file  . Food insecurity:    Worry: Not on file    Inability: Not on file  . Transportation needs:    Medical: Not on file    Non-medical: Not on file  Tobacco Use  . Smoking status: Former Smoker    Packs/day: 0.25    Years: 30.00    Pack years: 7.50    Types: Cigarettes    Last attempt to quit:  10/19/1999    Years since quitting: 18.2  . Smokeless tobacco: Never Used  Substance and Sexual Activity  . Alcohol use: No  . Drug use: No  . Sexual activity: Not Currently    Comment: widowed  Lifestyle  . Physical activity:    Days per week: Not on file    Minutes per session: Not on file  . Stress: Not on file  Relationships  . Social connections:    Talks on phone: Not on file    Gets together: Not on file    Attends religious service: Not on file    Active member of club or organization: Not on file    Attends meetings of clubs or organizations: Not on file    Relationship status: Not on file  . Intimate partner violence:    Fear of current or ex partner: Not on file    Emotionally abused: Not on file    Physically abused: Not on file    Forced sexual activity: Not on file  Other Topics Concern  . Not on file  Social History Narrative  . Not on file    FAMILY HISTORY:  Family History  Problem Relation Age of Onset  . Colon cancer Father   . Cancer Mother   . Stroke Mother   . Hypertension Brother   . Dementia Sister   . Multiple sclerosis Daughter   . Pseudotumor cerebri Daughter   . Neuropathy Daughter     CURRENT MEDICATIONS:  Outpatient Encounter Medications as of 01/27/2018  Medication Sig Note  . acetaminophen (TYLENOL) 325 MG tablet Take 650 mg by mouth every 4 (four) hours as needed for mild pain.  12/21/2017: Takes one daily  . aspirin EC 81 MG tablet Take 81 mg by mouth daily.   . ciprofloxacin (CIPRO) 500 MG tablet Take 1 tablet (500 mg total) by mouth 2 (two) times daily.   Marland Kitchen lisinopril (PRINIVIL,ZESTRIL) 10 MG tablet Take 1 tablet (10 mg total) by mouth daily.   . Pediatric Multivitamins-Iron (FLINTSTONES PLUS IRON) chewable tablet Chew 1 tablet by mouth 2 (two) times daily.   . polyethylene glycol (MIRALAX / GLYCOLAX) packet Take 17 g by mouth daily as needed for mild constipation.   Marland Kitchen UNABLE TO FIND Pullups and chuks for use daily as needed   .  [DISCONTINUED] allopurinol (ZYLOPRIM) 300 MG tablet Take 1 tablet (300 mg total) by mouth daily.   . [DISCONTINUED] prochlorperazine (COMPAZINE) 10 MG tablet Take 1 tablet (  10 mg total) by mouth every 6 (six) hours as needed (Nausea or vomiting). (Patient not taking: Reported on 09/22/2017)    No facility-administered encounter medications on file as of 01/27/2018.     ALLERGIES:  Allergies  Allergen Reactions  . Codeine Hives  . Fosamax [Alendronate Sodium] Other (See Comments)    Pt reports excessive urination on the day she took the pill     PHYSICAL EXAM:  ECOG Performance status: 2  Vitals:   01/27/18 1522  BP: 125/89  Pulse: 82  Resp: 18  Temp: 98.5 F (36.9 C)  SpO2: 100%   Filed Weights   01/27/18 1522  Weight: 157 lb (71.2 kg)    Physical Exam  Lymphadenopathy:    She has no cervical adenopathy.    She has no axillary adenopathy.  Psychiatric: She has a normal mood and affect. Her speech is normal and behavior is normal. Thought content normal. Cognition and memory are normal.     LABORATORY DATA:  I have reviewed the labs as listed.  CBC    Component Value Date/Time   WBC 2.3 (L) 01/18/2018 1102   RBC 3.04 (L) 01/18/2018 1102   HGB 10.0 (L) 01/18/2018 1102   HCT 31.2 (L) 01/18/2018 1102   PLT 189 01/18/2018 1102   MCV 102.6 (H) 01/18/2018 1102   MCH 32.9 01/18/2018 1102   MCHC 32.1 01/18/2018 1102   RDW 13.3 01/18/2018 1102   LYMPHSABS 0.4 (L) 01/18/2018 1102   MONOABS 0.3 01/18/2018 1102   EOSABS 0.1 01/18/2018 1102   BASOSABS 0.0 01/18/2018 1102   CMP Latest Ref Rng & Units 01/18/2018 09/22/2017 06/14/2017  Glucose 65 - 99 mg/dL 95 68 142(H)  BUN 6 - 20 mg/dL 28(H) 28(H) 18  Creatinine 0.44 - 1.00 mg/dL 1.22(H) 1.06(H) 0.94  Sodium 135 - 145 mmol/L 144 144 140  Potassium 3.5 - 5.1 mmol/L 4.0 3.9 4.0  Chloride 101 - 111 mmol/L 108 108 104  CO2 22 - 32 mmol/L _0 Calcium 8.9 - 10.3 mg/dL 9.1 9.3 9.0  Total Protein 6.5 - 8.1 g/dL 6.1(L)  6.1(L) 5.7(L)  Total Bilirubin 0.3 - 1.2 mg/dL 0.6 0.4 0.4  Alkaline Phos 38 - 126 U/L 70 71 59  AST 15 - 41 U/L _1 ALT 14 - 54 U/L 17 15 13(L)       DIAGNOSTIC IMAGING:  I have independently reviewed her CT scan of the chest, abdomen and pelvis dated 01/26/2018 and compared with the PET scan dated December 2018.  Agree with radiology report.     ASSESSMENT & PLAN:   Diffuse large B cell lymphoma ([place]) 1.  Diffuse large B-cell lymphoma: -6 cycles of R CHOP from 03/01/2017 through 06/14/2017 -Radiation therapy to the lymph nodes from 07/19/2017 through 08/18/2017 in [place] -She does not have any B symptoms including unexplained fevers, night sweats or weight loss.  She currently lives with her daughter and does cooking, laundry and able to bathe herself.  There is no significant decline in her performance status.  She does not report any new onset pains.  I discussed the findings of the CT scan of the chest, abdomen and pelvis dated 01/26/2018 which showed slight improvement from PET scan done in December.  Her LDH was mildly elevated.  Otherwise blood work was within her normal range.  Hence I have recommended a 31-monthfollow-up with CT scans and blood work.  She was told to come back sooner if any of  the above-mentioned symptoms develop.      Orders placed this encounter:  Orders Placed This Encounter  Procedures  . CT Chest W Contrast  . CT Abdomen Pelvis W Contrast  . CBC with Differential  . Comprehensive metabolic panel  . Lactate dehydrogenase      Derek Jack, MD [place] [phone]

## 2018-03-15 ENCOUNTER — Encounter (HOSPITAL_COMMUNITY): Payer: Medicare Other

## 2018-03-27 ENCOUNTER — Ambulatory Visit (INDEPENDENT_AMBULATORY_CARE_PROVIDER_SITE_OTHER): Payer: Medicare Other

## 2018-03-27 VITALS — BP 110/58 | HR 72 | Temp 98.4°F | Resp 18 | Ht 66.5 in | Wt 168.1 lb

## 2018-03-27 DIAGNOSIS — Z1231 Encounter for screening mammogram for malignant neoplasm of breast: Secondary | ICD-10-CM

## 2018-03-27 DIAGNOSIS — Z1239 Encounter for other screening for malignant neoplasm of breast: Secondary | ICD-10-CM

## 2018-03-27 DIAGNOSIS — Z0000 Encounter for general adult medical examination without abnormal findings: Secondary | ICD-10-CM

## 2018-03-27 NOTE — Patient Instructions (Addendum)
[name] , Thank you for taking time to come for your Medicare Wellness Visit. I appreciate your ongoing commitment to your health goals. Please review the following plan we discussed and let me know if I can assist you in the future.   Screening recommendations/referrals: Colonoscopy: UTD Mammogram: You are past due for this. We need to get you scheduled to have one. Please call and schedule mammogram for patient today. Bone Density: UTD Recommended yearly ophthalmology/optometry visit for glaucoma screening and checkup Recommended yearly dental visit for hygiene and checkup  Vaccinations: Influenza vaccine: Due in the Fall of 2019 Pneumococcal vaccine: UTD Tdap vaccine: UTD Shingles vaccine: Call your insurance company to see if this is covered    Advanced directives: You have these in place  Conditions/risks identified: Discussed during your visit  Next appointment: May 08, 2018 at 10:20 am with Dr.Simpson   Preventive Care 82 Years and Older, Female Preventive care refers to lifestyle choices and visits with your health care provider that can promote health and wellness. What does preventive care include?  A yearly physical exam. This is also called an annual well check.  Dental exams once or twice a year.  Routine eye exams. Ask your health care provider how often you should have your eyes checked.  Personal lifestyle choices, including:  Daily care of your teeth and gums.  Regular physical activity.  Eating a healthy diet.  Avoiding tobacco and drug use.  Limiting alcohol use.  Practicing safe sex.  Taking low-dose aspirin every day.  Taking vitamin and mineral supplements as recommended by your health care provider. What happens during an annual well check? The services and screenings done by your health care provider during your annual well check will depend on your age, overall health, lifestyle risk factors, and family history of disease. Counseling    Your health care provider may ask you questions about your:  Alcohol use.  Tobacco use.  Drug use.  Emotional well-being.  Home and relationship well-being.  Sexual activity.  Eating habits.  History of falls.  Memory and ability to understand (cognition).  Work and work Statistician.  Reproductive health. Screening  You may have the following tests or measurements:  Height, weight, and BMI.  Blood pressure.  Lipid and cholesterol levels. These may be checked every 5 years, or more frequently if you are over 62 years old.  Skin check.  Lung cancer screening. You may have this screening every year starting at age 73 if you have a 30-pack-year history of smoking and currently smoke or have quit within the past 15 years.  Fecal occult blood test (FOBT) of the stool. You may have this test every year starting at age 73.  Flexible sigmoidoscopy or colonoscopy. You may have a sigmoidoscopy every 5 years or a colonoscopy every 10 years starting at age 65.  Hepatitis C blood test.  Hepatitis B blood test.  Sexually transmitted disease (STD) testing.  Diabetes screening. This is done by checking your blood sugar (glucose) after you have not eaten for a while (fasting). You may have this done every 1-3 years.  Bone density scan. This is done to screen for osteoporosis. You may have this done starting at age 23.  Mammogram. This may be done every 1-2 years. Talk to your health care provider about how often you should have regular mammograms. Talk with your health care provider about your test results, treatment options, and if necessary, the need for more tests. Vaccines  Your health  care provider may recommend certain vaccines, such as:  Influenza vaccine. This is recommended every year.  Tetanus, diphtheria, and acellular pertussis (Tdap, Td) vaccine. You may need a Td booster every 10 years.  Zoster vaccine. You may need this after age 23.  Pneumococcal 13-valent  conjugate (PCV13) vaccine. One dose is recommended after age 79.  Pneumococcal polysaccharide (PPSV23) vaccine. One dose is recommended after age 70. Talk to your health care provider about which screenings and vaccines you need and how often you need them. This information is not intended to replace advice given to you by your health care provider. Make sure you discuss any questions you have with your health care provider. Document Released: 10/31/2015 Document Revised: 06/23/2016 Document Reviewed: 08/05/2015 Elsevier Interactive Patient Education  2017 [place] Prevention in the Home Falls can cause injuries. They can happen to people of all ages. There are many things you can do to make your home safe and to help prevent falls. What can I do on the outside of my home?  Regularly fix the edges of walkways and driveways and fix any cracks.  Remove anything that might make you trip as you walk through a door, such as a raised step or threshold.  Trim any bushes or trees on the path to your home.  Use bright outdoor lighting.  Clear any walking paths of anything that might make someone trip, such as rocks or tools.  Regularly check to see if handrails are loose or broken. Make sure that both sides of any steps have handrails.  Any raised decks and porches should have guardrails on the edges.  Have any leaves, snow, or ice cleared regularly.  Use sand or salt on walking paths during winter.  Clean up any spills in your garage right away. This includes oil or grease spills. What can I do in the bathroom?  Use night lights.  Install grab bars by the toilet and in the tub and shower. Do not use towel bars as grab bars.  Use non-skid mats or decals in the tub or shower.  If you need to sit down in the shower, use a plastic, non-slip stool.  Keep the floor dry. Clean up any water that spills on the floor as soon as it happens.  Remove soap buildup in the tub or  shower regularly.  Attach bath mats securely with double-sided non-slip rug tape.  Do not have throw rugs and other things on the floor that can make you trip. What can I do in the bedroom?  Use night lights.  Make sure that you have a light by your bed that is easy to reach.  Do not use any sheets or blankets that are too big for your bed. They should not hang down onto the floor.  Have a firm chair that has side arms. You can use this for support while you get dressed.  Do not have throw rugs and other things on the floor that can make you trip. What can I do in the kitchen?  Clean up any spills right away.  Avoid walking on wet floors.  Keep items that you use a lot in easy-to-reach places.  If you need to reach something above you, use a strong step stool that has a grab bar.  Keep electrical cords out of the way.  Do not use floor polish or wax that makes floors slippery. If you must use wax, use non-skid floor wax.  Do not have  throw rugs and other things on the floor that can make you trip. What can I do with my stairs?  Do not leave any items on the stairs.  Make sure that there are handrails on both sides of the stairs and use them. Fix handrails that are broken or loose. Make sure that handrails are as long as the stairways.  Check any carpeting to make sure that it is firmly attached to the stairs. Fix any carpet that is loose or worn.  Avoid having throw rugs at the top or bottom of the stairs. If you do have throw rugs, attach them to the floor with carpet tape.  Make sure that you have a light switch at the top of the stairs and the bottom of the stairs. If you do not have them, ask someone to add them for you. What else can I do to help prevent falls?  Wear shoes that:  Do not have high heels.  Have rubber bottoms.  Are comfortable and fit you well.  Are closed at the toe. Do not wear sandals.  If you use a stepladder:  Make sure that it is fully  opened. Do not climb a closed stepladder.  Make sure that both sides of the stepladder are locked into place.  Ask someone to hold it for you, if possible.  Clearly mark and make sure that you can see:  Any grab bars or handrails.  First and last steps.  Where the edge of each step is.  Use tools that help you move around (mobility aids) if they are needed. These include:  Canes.  Walkers.  Scooters.  Crutches.  Turn on the lights when you go into a dark area. Replace any light bulbs as soon as they burn out.  Set up your furniture so you have a clear path. Avoid moving your furniture around.  If any of your floors are uneven, fix them.  If there are any pets around you, be aware of where they are.  Review your medicines with your doctor. Some medicines can make you feel dizzy. This can increase your chance of falling. Ask your doctor what other things that you can do to help prevent falls. This information is not intended to replace advice given to you by your health care provider. Make sure you discuss any questions you have with your health care provider. Document Released: 07/31/2009 Document Revised: 03/11/2016 Document Reviewed: 11/08/2014 Elsevier Interactive Patient Education  2017 [employer].

## 2018-03-27 NOTE — Progress Notes (Signed)
 Subjective:   [name] is a 84 y.o. female who presents for Medicare Annual (Subsequent) preventive examination.  Review of Systems:    Cardiac Risk Factors include: advanced age (>29men, >61 women);hypertension     Objective:     Vitals: BP (!) 110/58 (BP Location: Left Arm, Patient Position: Sitting, Cuff Size: Normal)   Pulse 72   Temp 98.4 F (36.9 C) (Temporal)   Resp 18   Ht 5' 6.5" (1.689 m)   Wt 168 lb 1.3 oz (76.2 kg)   SpO2 96%   BMI 26.72 kg/m   Body mass index is 26.72 kg/m.  Advanced Directives 03/27/2018 01/27/2018 01/18/2018 12/01/2017 09/22/2017 06/30/2017 06/14/2017  Does Patient Have a Medical Advance Directive? Yes Yes Yes Yes Yes No No  Type of Advance Directive - Healthcare Power of [place] - -  Does patient want to make changes to medical advance directive? No - Patient declined No - Patient declined No - Patient declined No - Patient declined No - Patient declined No - Patient declined Yes (MAU/Ambulatory/Procedural Areas - Information given)  Copy of [place] in Chart? - No - copy requested No - copy requested No - copy requested No - copy requested - -  Would patient like information on creating a medical advance directive? - No - Patient declined No - Patient declined No - Patient declined No - Patient declined - -    Tobacco Social History   Tobacco Use  Smoking Status Former Smoker  . Packs/day: 0.25  . Years: 30.00  . Pack years: 7.50  . Types: Cigarettes  . Last attempt to quit: 10/19/1999  . Years since quitting: 18.4  Smokeless Tobacco Never Used     Counseling given: Yes   Clinical Intake:  Pre-visit preparation completed: No  Pain : No/denies pain     BMI - recorded: 26.7 Nutritional Status: BMI 25 -29 Overweight Diabetes: No  How often do you need to have someone help you when you read instructions, pamphlets, or  other written materials from your doctor or pharmacy?: 1 - Never What is the last grade level you completed in school?: 12 + 2 years of college  Interpreter Needed?: No  Information entered by ::   Past Medical History:  Diagnosis Date  . Arthritis of knee    bilateral    . Chronic renal insufficiency   . Diabetes mellitus, type 2 ([place])   . GERD (gastroesophageal reflux disease)   . History of kidney stones   . Hypertension   . Iron deficiency anemia 12/28/2016  . Lymphadenopathy, abdominal 12/28/2016  . Osteopenia   . PAD (peripheral artery disease) ([place])   . Personal history of colonic polyps 3 years ago    colonoscopy    Past Surgical History:  Procedure Laterality Date  . BIOPSY  12/16/2016   Procedure: BIOPSY;  Surgeon: Rogene Houston, MD;  [facility];  Service: Endoscopy;;  gastric polyp  . bunionectomy bilateral  feet    . COLONOSCOPY N/A 12/16/2016   Procedure: COLONOSCOPY;  Surgeon: Rogene Houston, MD;  [facility];  Service: Endoscopy;  Laterality: N/A;  2:15  . DENTAL SURGERY    . ESOPHAGOGASTRODUODENOSCOPY N/A 12/16/2016   Procedure: ESOPHAGOGASTRODUODENOSCOPY (EGD);  Surgeon: Rogene Houston, MD;  [facility];  Service: Endoscopy;  Laterality: N/A;  . gum disease  2010   treated by  Dr. Andree Elk   . LAPAROTOMY N/A 01/31/2017   Procedure: EXPLORATORY LAPAROTOMY;  Surgeon: Aviva Signs, MD;  Location: [facility];  Service: General;  Laterality: N/A;  . Left knee manipulation  under anasthesia due to contracture -  03/08/08   Dr. Aline Brochure   . left knee replacement  08/14/08   Dr. Aline Brochure   . LYMPH NODE BIOPSY  01/31/2017   Procedure: LYMPH NODE BIOPSY;  Surgeon: Aviva Signs, MD;  Location: [facility];  Service: General;;  . PORTACATH PLACEMENT Left 02/21/2017   Procedure: INSERTION PORT-A-CATH;  Surgeon: Aviva Signs, MD;  Location: [facility];  Service: General;  Laterality: Left;  . TOTAL ABDOMINAL HYSTERECTOMY     bleeding   . TOTAL KNEE  ARTHROPLASTY Right 05/12/2016   Procedure: TOTAL KNEE ARTHROPLASTY;  Surgeon: [name], MD;  Location: [facility];  Service: Orthopedics;  Laterality: Right;  . WISDOM TOOTH EXTRACTION     Family History  Problem Relation Age of Onset  . Colon cancer Father   . Cancer Mother   . Stroke Mother   . Hypertension Brother   . Dementia Sister   . Multiple sclerosis Daughter   . Pseudotumor cerebri Daughter   . Neuropathy Daughter    Social History   Socioeconomic History  . Marital status: Widowed    Spouse name: Not on file  . Number of children: 3  . Years of education: college   . Highest education level: Not on file  Occupational History  . Occupation: retired Therapist, music: RETIRED  Social Needs  . Financial resource strain: Not hard at all  . Food insecurity:    Worry: Never true    Inability: Never true  . Transportation needs:    Medical: No    Non-medical: No  Tobacco Use  . Smoking status: Former Smoker    Packs/day: 0.25    Years: 30.00    Pack years: 7.50    Types: Cigarettes    Last attempt to quit: 10/19/1999    Years since quitting: 18.4  . Smokeless tobacco: Never Used  Substance and Sexual Activity  . Alcohol use: No  . Drug use: No  . Sexual activity: Not Currently    Comment: widowed  Lifestyle  . Physical activity:    Days per week: 0 days    Minutes per session: 0 min  . Stress: Only a little  Relationships  . Social connections:    Talks on phone: More than three times a week    Gets together: Twice a week    Attends religious service: Never    Active member of club or organization: No    Attends meetings of clubs or organizations: Never    Relationship status: Widowed  Other Topics Concern  . Not on file  Social History Narrative  . Not on file    Outpatient Encounter Medications as of 03/27/2018  Medication Sig  . acetaminophen (TYLENOL) 325 MG tablet Take 650 mg by mouth every 4 (four) hours as needed for mild pain.    Marland Kitchen aspirin EC 81 MG tablet Take 81 mg by mouth daily.  . ciprofloxacin (CIPRO) 500 MG tablet Take 1 tablet (500 mg total) by mouth 2 (two) times daily.  Marland Kitchen lisinopril (PRINIVIL,ZESTRIL) 10 MG tablet Take 1 tablet (10 mg total) by mouth daily.  . Pediatric Multivitamins-Iron (FLINTSTONES PLUS IRON) chewable tablet Chew 1 tablet by mouth 2 (two) times daily.  . polyethylene glycol (MIRALAX / GLYCOLAX)  packet Take 17 g by mouth daily as needed for mild constipation.  Marland Kitchen UNABLE TO FIND Pullups and chuks for use daily as needed  . [DISCONTINUED] allopurinol (ZYLOPRIM) 300 MG tablet Take 1 tablet (300 mg total) by mouth daily.  . [DISCONTINUED] prochlorperazine (COMPAZINE) 10 MG tablet Take 1 tablet (10 mg total) by mouth every 6 (six) hours as needed (Nausea or vomiting). (Patient not taking: Reported on 09/22/2017)   No facility-administered encounter medications on file as of 03/27/2018.     Activities of Daily Living In your present state of health, do you have any difficulty performing the following activities: 03/27/2018  Hearing? N  Vision? N  Difficulty concentrating or making decisions? Y  Comment sometimes, maybe comes with age  Walking or climbing stairs? Y  Comment both knees have been replaced  Dressing or bathing? N  Doing errands, shopping? N  Comment just doesn't drive anymore  Preparing Food and eating ? N  Using the Toilet? N  In the past six months, have you accidently leaked urine? Y  Do you have problems with loss of bowel control? N  Managing your Medications? N  Managing your Finances? N  Housekeeping or managing your Housekeeping? N  Some recent data might be hidden    Patient Care Team: Fayrene Helper, MD as PCP - General Rutherford Guys, MD as Attending Physician (Ophthalmology) Burnell Blanks, MD as Attending Physician (Cardiology) Fran Lowes, MD as Consulting Physician (Nephrology) Rogene Houston, MD as Consulting Physician  (Gastroenterology)    Assessment:   This is a routine wellness examination for [place].  Exercise Activities and Dietary recommendations Current Exercise Habits: The patient does not participate in regular exercise at present, Exercise limited by: orthopedic condition(s)  Goals    . Exercise 3x per week (30 min per time)     Recommend starting a routine exercise program at least 3 days a week for 30-45 minutes at a time as tolerated.         Fall Risk Fall Risk  03/27/2018 12/21/2017 04/21/2017 03/23/2017 11/08/2016  Falls in the past year? No Yes Yes Yes Yes  Comment - - - - -  Number falls in past yr: - 1 1 2  or more 2 or more  Injury with Fall? - - No No No  Risk Factor Category  - - High Fall Risk High Fall Risk High Fall Risk  Risk for fall due to : - - Impaired mobility History of fall(s);Impaired balance/gait;Impaired mobility -  Follow up - - Follow up appointment Education provided;Falls prevention discussed -  Comment - - - recommend using your walker at all times when standing or walking -   Is the patient's home free of loose throw rugs in walkways, pet beds, electrical cords, etc?   yes      Grab bars in the bathroom? yes      Handrails on the stairs?   yes      Adequate lighting?   yes  Timed Get Up and Go performed: No  Depression Screen PHQ 2/9 Scores 03/27/2018 04/21/2017 03/23/2017 11/08/2016  PHQ - 2 Score 0 1 1 5   PHQ- 9 Score - - - 14     Cognitive Function MMSE - Mini Mental State Exam 03/23/2017  Orientation to time 5  Orientation to Place 5  Registration 3  Attention/ Calculation 5  Recall 1  Language- name 2 objects 2  Language- repeat 1  Language- follow 3 step command 3  Language- read & follow direction 1  Write a sentence 1  Copy design 0  Total score 27        Immunization History  Administered Date(s) Administered  . Influenza Split 07/29/2011, 08/16/2012  . Influenza Whole 08/15/2006, 08/07/2007, 07/08/2008, 09/03/2009, 07/02/2010  .  Influenza,inj,Quad PF,6+ Mos 07/10/2013, 09/24/2014, 08/12/2015, 11/08/2016  . PPD Test 05/14/2016  . Pneumococcal Conjugate-13 05/03/2014  . Pneumococcal Polysaccharide-23 04/25/2007  . Td 09/03/2009  . Zoster 10/23/2009    Qualifies for Shingles Vaccine?Call your insurance company to see if you qualify for this vaccine.   Screening Tests Health Maintenance  Topic Date Due  . OPHTHALMOLOGY EXAM  11/06/2015  . HEMOGLOBIN A1C  05/08/2017  . FOOT EXAM  11/08/2017  . INFLUENZA VACCINE  07/18/2018 (Originally 05/18/2018)  . TETANUS/TDAP  09/04/2019  . DEXA SCAN  Completed  . PNA vac Low Risk Adult  Completed    Cancer Screenings: Lung: Low Dose CT Chest recommended if Age 47-80 years, 30 pack-year currently smoking OR have quit w/in 15years. Patient does not qualify. Breast:  Up to date on Mammogram? No   Up to date of Bone Density/Dexa? Yes Colorectal: UTD      Plan:      I have personally reviewed and noted the following in the patient's chart:   . Medical and social history . Use of alcohol, tobacco or illicit drugs  . Current medications and supplements . Functional ability and status . Nutritional status . Physical activity . Advanced directives . List of other physicians . Hospitalizations, surgeries, and ER visits in previous 12 months . Vitals . Screenings to include cognitive, depression, and falls . Referrals and appointments  In addition, I have reviewed and discussed with patient certain preventive protocols, quality metrics, and best practice recommendations. A written personalized care plan for preventive services as well as general preventive health recommendations were provided to patient.     Tod Persia, [place]  03/27/2018

## 2018-04-25 ENCOUNTER — Other Ambulatory Visit (HOSPITAL_COMMUNITY): Payer: Medicare Other

## 2018-04-28 ENCOUNTER — Encounter (HOSPITAL_COMMUNITY): Payer: Self-pay

## 2018-04-28 ENCOUNTER — Other Ambulatory Visit (HOSPITAL_COMMUNITY): Payer: Medicare Other

## 2018-04-28 ENCOUNTER — Ambulatory Visit (HOSPITAL_COMMUNITY)
Admission: RE | Admit: 2018-04-28 | Discharge: 2018-04-28 | Disposition: A | Discharge status: Home / Self Care | Payer: Medicare Other | Source: Ambulatory Visit | Attending: Family Medicine | Admitting: Family Medicine

## 2018-04-28 DIAGNOSIS — Z1239 Encounter for other screening for malignant neoplasm of breast: Secondary | ICD-10-CM

## 2018-04-28 DIAGNOSIS — Z1231 Encounter for screening mammogram for malignant neoplasm of breast: Secondary | ICD-10-CM | POA: Insufficient documentation

## 2018-05-08 ENCOUNTER — Other Ambulatory Visit: Payer: Self-pay

## 2018-05-08 ENCOUNTER — Ambulatory Visit (INDEPENDENT_AMBULATORY_CARE_PROVIDER_SITE_OTHER): Payer: Medicare Other | Admitting: Family Medicine

## 2018-05-08 ENCOUNTER — Encounter: Payer: Self-pay | Admitting: Family Medicine

## 2018-05-08 VITALS — BP 130/82 | HR 60 | Resp 18 | Ht 66.5 in | Wt 170.0 lb

## 2018-05-08 DIAGNOSIS — Z0000 Encounter for general adult medical examination without abnormal findings: Secondary | ICD-10-CM | POA: Diagnosis not present

## 2018-05-08 DIAGNOSIS — N183 Chronic kidney disease, stage 3 unspecified: Secondary | ICD-10-CM

## 2018-05-08 DIAGNOSIS — M949 Disorder of cartilage, unspecified: Secondary | ICD-10-CM

## 2018-05-08 DIAGNOSIS — M899 Disorder of bone, unspecified: Secondary | ICD-10-CM | POA: Diagnosis not present

## 2018-05-08 DIAGNOSIS — M7989 Other specified soft tissue disorders: Secondary | ICD-10-CM

## 2018-05-08 DIAGNOSIS — I10 Essential (primary) hypertension: Secondary | ICD-10-CM

## 2018-05-08 DIAGNOSIS — Z13220 Encounter for screening for lipoid disorders: Secondary | ICD-10-CM

## 2018-05-08 NOTE — Progress Notes (Signed)
    [name]     MRN: [id number]      DOB: [birth year]  HPI: Patient is in for annual physical exam. Right leg swelling noted, unsure how long, denies shortness of breath or hemoptysis, h/o dVT. Requests help at home with personal care states it took her a long time to get a bath  Immunization is reviewed   PE: BP 130/82   Pulse 60   Resp 18   Ht 5' 6.5" (1.689 m)   Wt 170 lb (77.1 kg)   SpO2 99%   BMI 27.03 kg/m   Pleasant  female, alert and oriented x 3, in no cardio-pulmonary distress. Afebrile. HEENT No facial trauma or asymetry. Sinuses non tender.  Extra occullar muscles intact,External ears normal, tympanic membranes clear. Oropharynx moist, no exudate. Neck: decreased ROM, no adenopathy,JVD or thyromegaly.No bruits.  Chest: Clear to ascultation bilaterally.No crackles or wheezes. Non tender to palpation  Breast: No asymetry,no masses or lumps. No tenderness. No nipple discharge or inversion. No axillary or supraclavicular adenopathy  Cardiovascular system; Heart sounds normal,  S1 and  S2 ,no S3.  No murmur, or thrill. Apical beat not displaced Peripheral pulses normal. Left lower extremity swelling noted to knee  Abdomen: Soft, non tender, no organomegaly or masses. No bruits. Bowel sounds normal. No guarding, tenderness or rebound.    GU: No exam indicated  Musculoskeletal exam: Decreased l ROM of spine, hips , shoulders and knees. Deformity ,swelling and  crepitus noted. No muscle wasting or atrophy.   Neurologic: Cranial nerves 2 to 12 intact. Power, tone ,sensation  normal throughout. Abnormal gait. Relies on walker    Skin: Intact, no ulceration, erythema , scaling or rash noted. Pigmentation normal throughout  Psych; Normal mood and affect. Judgement and concentration normal   Assessment & Plan:  Annual physical exam Annual exam as documented.  Immunization  needs are specifically addressed at this visit.   Left leg  swelling Left leg swelling in pt with h/o DVT needs [place], will arrange

## 2018-05-08 NOTE — Patient Instructions (Signed)
F/U with MD in 3.5 month, call if you need me sooner  Labs today lipid, cmp and EGFr, TSH and vit D  I am referring you for an  [place] of your left leg as it is swollen will call with appointment  Will try to arrange fo assistance with bathing 3 times weekly and personal care

## 2018-05-09 ENCOUNTER — Encounter: Payer: Self-pay | Admitting: Family Medicine

## 2018-05-09 DIAGNOSIS — M7989 Other specified soft tissue disorders: Secondary | ICD-10-CM | POA: Insufficient documentation

## 2018-05-09 LAB — COMPLETE METABOLIC PANEL WITH GFR
AG RATIO: 1.5 (calc) (ref 1.0–2.5)
ALKALINE PHOSPHATASE (APISO): 88 U/L (ref 33–130)
ALT: 14 U/L (ref 6–29)
AST: 22 U/L (ref 10–35)
Albumin: 3.7 g/dL (ref 3.6–5.1)
BILIRUBIN TOTAL: 0.7 mg/dL (ref 0.2–1.2)
BUN / CREAT RATIO: 18 (calc) (ref 6–22)
BUN: 18 mg/dL (ref 7–25)
CO2: 31 mmol/L (ref 20–32)
CREATININE: 1.02 mg/dL — AB (ref 0.60–0.88)
Calcium: 9.5 mg/dL (ref 8.6–10.4)
Chloride: 106 mmol/L (ref 98–110)
GFR, Est African American: 60 mL/min/{1.73_m2} (ref >=60)
GFR, Est Non African American: 52 mL/min/{1.73_m2} — ABNORMAL LOW (ref >=60)
GLOBULIN: 2.5 g/dL (ref 1.9–3.7)
Glucose, Bld: 85 mg/dL (ref 65–99)
POTASSIUM: 4.2 mmol/L (ref 3.5–5.3)
SODIUM: 143 mmol/L (ref 135–146)
Total Protein: 6.2 g/dL (ref 6.1–8.1)

## 2018-05-09 LAB — VITAMIN D 25 HYDROXY (VIT D DEFICIENCY, FRACTURES): Vit D, 25-Hydroxy: 29 ng/mL — ABNORMAL LOW (ref 30–100)

## 2018-05-09 LAB — LIPID PANEL
Cholesterol: 171 mg/dL (ref <200)
HDL: 74 mg/dL (ref >50)
LDL Cholesterol (Calc): 82 mg/dL (calc)
Non-HDL Cholesterol (Calc): 97 mg/dL (calc) (ref <130)
Total CHOL/HDL Ratio: 2.3 (calc) (ref <5.0)
Triglycerides: 70 mg/dL (ref <150)

## 2018-05-09 LAB — TSH: TSH: 1.38 m[IU]/L (ref 0.40–4.50)

## 2018-05-09 NOTE — Assessment & Plan Note (Signed)
Left leg swelling in pt with h/o DVT needs [place], will arrange

## 2018-05-09 NOTE — Assessment & Plan Note (Signed)
Annual exam as documented.  Immunization needs are specifically addressed at this visit.  

## 2018-05-10 ENCOUNTER — Telehealth: Payer: Self-pay | Admitting: Family Medicine

## 2018-05-10 NOTE — Telephone Encounter (Signed)
Patients daughter states she is returning your call, she says her availability is this Friday after 2pm ? Cb#: [id number]/ [phone]

## 2018-05-11 ENCOUNTER — Encounter: Payer: Self-pay | Admitting: Family Medicine

## 2018-05-11 NOTE — Telephone Encounter (Signed)
Scheduled for Friday July 26 at 3:30 at [facility]. Left daughter message on voicemail as requested

## 2018-05-12 ENCOUNTER — Ambulatory Visit (HOSPITAL_COMMUNITY)
Admission: RE | Admit: 2018-05-12 | Discharge: 2018-05-12 | Disposition: A | Discharge status: Home / Self Care | Payer: Medicare Other | Source: Ambulatory Visit | Attending: Family Medicine | Admitting: Family Medicine

## 2018-05-12 DIAGNOSIS — M7989 Other specified soft tissue disorders: Secondary | ICD-10-CM

## 2018-05-13 ENCOUNTER — Encounter: Payer: Self-pay | Admitting: Family Medicine

## 2018-05-31 ENCOUNTER — Other Ambulatory Visit (HOSPITAL_COMMUNITY): Payer: Medicare Other

## 2018-06-05 ENCOUNTER — Inpatient Hospital Stay (HOSPITAL_COMMUNITY): Payer: Medicare Other | Attending: Internal Medicine

## 2018-06-05 DIAGNOSIS — E119 Type 2 diabetes mellitus without complications: Secondary | ICD-10-CM | POA: Diagnosis not present

## 2018-06-05 DIAGNOSIS — D7589 Other specified diseases of blood and blood-forming organs: Secondary | ICD-10-CM | POA: Diagnosis not present

## 2018-06-05 DIAGNOSIS — I700 Atherosclerosis of aorta: Secondary | ICD-10-CM | POA: Insufficient documentation

## 2018-06-05 DIAGNOSIS — C8333 Diffuse large B-cell lymphoma, intra-abdominal lymph nodes: Secondary | ICD-10-CM | POA: Diagnosis not present

## 2018-06-05 DIAGNOSIS — I10 Essential (primary) hypertension: Secondary | ICD-10-CM | POA: Insufficient documentation

## 2018-06-05 LAB — COMPREHENSIVE METABOLIC PANEL
ALK PHOS: 77 U/L (ref 38–126)
ALT: 21 U/L (ref 0–44)
ANION GAP: 8 (ref 5–15)
AST: 30 U/L (ref 15–41)
Albumin: 3.3 g/dL — ABNORMAL LOW (ref 3.5–5.0)
BILIRUBIN TOTAL: 0.8 mg/dL (ref 0.3–1.2)
BUN: 22 mg/dL (ref 8–23)
CALCIUM: 9.1 mg/dL (ref 8.9–10.3)
CO2: 29 mmol/L (ref 22–32)
Chloride: 105 mmol/L (ref 98–111)
Creatinine, Ser: 1.15 mg/dL — ABNORMAL HIGH (ref 0.44–1.00)
GFR calc Af Amer: 50 mL/min — ABNORMAL LOW (ref >60)
GFR, EST NON AFRICAN AMERICAN: 43 mL/min — AB (ref >60)
Glucose, Bld: 114 mg/dL — ABNORMAL HIGH (ref 70–99)
Potassium: 4.2 mmol/L (ref 3.5–5.1)
Sodium: 142 mmol/L (ref 135–145)
TOTAL PROTEIN: 6.4 g/dL — AB (ref 6.5–8.1)

## 2018-06-05 LAB — LACTATE DEHYDROGENASE: LDH: 220 U/L — ABNORMAL HIGH (ref 98–192)

## 2018-06-05 LAB — CBC WITH DIFFERENTIAL/PLATELET
Basophils Absolute: 0 10*3/uL (ref 0.0–0.1)
Basophils Relative: 0 %
Eosinophils Absolute: 0.1 10*3/uL (ref 0.0–0.7)
Eosinophils Relative: 5 %
HEMATOCRIT: 33.8 % — AB (ref 36.0–46.0)
HEMOGLOBIN: 10.7 g/dL — AB (ref 12.0–15.0)
LYMPHS ABS: 0.5 10*3/uL — AB (ref 0.7–4.0)
LYMPHS PCT: 19 %
MCH: 32.5 pg (ref 26.0–34.0)
MCHC: 31.7 g/dL (ref 30.0–36.0)
MCV: 102.7 fL — AB (ref 78.0–100.0)
Monocytes Absolute: 0.3 10*3/uL (ref 0.1–1.0)
Monocytes Relative: 11 %
NEUTROS ABS: 1.6 10*3/uL — AB (ref 1.7–7.7)
NEUTROS PCT: 65 %
Platelets: 163 10*3/uL (ref 150–400)
RBC: 3.29 MIL/uL — ABNORMAL LOW (ref 3.87–5.11)
RDW: 13.9 % (ref 11.5–15.5)
WBC: 2.5 10*3/uL — ABNORMAL LOW (ref 4.0–10.5)

## 2018-06-05 MED ORDER — SODIUM CHLORIDE 0.9% FLUSH
10.0000 mL | Freq: Once | INTRAVENOUS | Status: AC
  Administered 2018-06-05: 10 mL

## 2018-06-05 MED ORDER — HEPARIN SOD (PORK) LOCK FLUSH 100 UNIT/ML IV SOLN
500.0000 [IU] | Freq: Once | INTRAVENOUS | Status: AC
  Administered 2018-06-05: 500 [IU] via INTRAVENOUS

## 2018-06-05 NOTE — Patient Instructions (Signed)
[place] at [facility]  Discharge Instructions:  You had lab work drawn and your port flushed today. Follow up as scheduled. Call clinic for any questions or concerns. _______________________________________________________________  Thank you for choosing [place] at [facility] to provide your oncology and hematology care.  To afford each patient quality time with our providers, please arrive at least 15 minutes before your scheduled appointment.  You need to re-schedule your appointment if you arrive 10 or more minutes late.  We strive to give you quality time with our providers, and arriving late affects you and other patients whose appointments are after yours.  Also, if you no show three or more times for appointments you may be dismissed from the clinic.  Again, thank you for choosing [place] at [place] hope is that these requests will allow you access to exceptional care and in a timely manner. _______________________________________________________________  If you have questions after your visit, please contact our office at [phone] between the hours of 8:30 a.m. and 5:00 p.m. Voicemails left after 4:30 p.m. will not be returned until the following business day. _______________________________________________________________  For prescription refill requests, have your pharmacy contact our office. _______________________________________________________________  Recommendations made by the consultant and any test results will be sent to your referring physician. _______________________________________________________________

## 2018-06-05 NOTE — Progress Notes (Signed)
[name] presents today for lab work and port flush. Port accessed and lab work drawn. Port flushed with saline and heparin and deaccessed. Bandaid applied. VSS. Denies complaints. Pt reminded about CT scan Wednesday and to pick up her contrast before she leaves today. She stated her daughter had already picked it up this morning. Also reminded patient to make transportation arrangements with [facility] for her MD appointment on Friday. Stated she has already done so. Patient discharged via wheelchair in company of sister.

## 2018-06-07 ENCOUNTER — Ambulatory Visit (HOSPITAL_COMMUNITY)
Admission: RE | Admit: 2018-06-07 | Discharge: 2018-06-07 | Disposition: A | Discharge status: Home / Self Care | Payer: Medicare Other | Source: Ambulatory Visit | Attending: Hematology | Admitting: Hematology

## 2018-06-07 DIAGNOSIS — K5730 Diverticulosis of large intestine without perforation or abscess without bleeding: Secondary | ICD-10-CM | POA: Insufficient documentation

## 2018-06-07 DIAGNOSIS — I700 Atherosclerosis of aorta: Secondary | ICD-10-CM | POA: Diagnosis not present

## 2018-06-07 DIAGNOSIS — I2510 Atherosclerotic heart disease of native coronary artery without angina pectoris: Secondary | ICD-10-CM | POA: Diagnosis not present

## 2018-06-07 DIAGNOSIS — C8333 Diffuse large B-cell lymphoma, intra-abdominal lymph nodes: Secondary | ICD-10-CM | POA: Insufficient documentation

## 2018-06-07 MED ORDER — IOPAMIDOL (ISOVUE-300) INJECTION 61%
100.0000 mL | Freq: Once | INTRAVENOUS | Status: AC | PRN
  Administered 2018-06-07: 100 mL via INTRAVENOUS

## 2018-06-09 ENCOUNTER — Encounter (HOSPITAL_COMMUNITY): Payer: Self-pay | Admitting: Internal Medicine

## 2018-06-09 ENCOUNTER — Inpatient Hospital Stay (HOSPITAL_COMMUNITY): Payer: Medicare Other | Attending: Internal Medicine | Admitting: Internal Medicine

## 2018-06-09 ENCOUNTER — Other Ambulatory Visit (HOSPITAL_COMMUNITY): Payer: Medicare Other

## 2018-06-09 VITALS — BP 150/64 | HR 81 | Temp 97.9°F | Resp 16

## 2018-06-09 DIAGNOSIS — C8333 Diffuse large B-cell lymphoma, intra-abdominal lymph nodes: Secondary | ICD-10-CM | POA: Diagnosis present

## 2018-06-09 DIAGNOSIS — D7589 Other specified diseases of blood and blood-forming organs: Secondary | ICD-10-CM

## 2018-06-09 DIAGNOSIS — I2510 Atherosclerotic heart disease of native coronary artery without angina pectoris: Secondary | ICD-10-CM | POA: Diagnosis not present

## 2018-06-09 DIAGNOSIS — Z79899 Other long term (current) drug therapy: Secondary | ICD-10-CM

## 2018-06-09 DIAGNOSIS — N183 Chronic kidney disease, stage 3 (moderate): Secondary | ICD-10-CM | POA: Diagnosis not present

## 2018-06-09 DIAGNOSIS — Z87891 Personal history of nicotine dependence: Secondary | ICD-10-CM | POA: Diagnosis not present

## 2018-06-09 DIAGNOSIS — I700 Atherosclerosis of aorta: Secondary | ICD-10-CM | POA: Insufficient documentation

## 2018-06-09 DIAGNOSIS — E1122 Type 2 diabetes mellitus with diabetic chronic kidney disease: Secondary | ICD-10-CM | POA: Insufficient documentation

## 2018-06-09 DIAGNOSIS — I10 Essential (primary) hypertension: Secondary | ICD-10-CM | POA: Diagnosis not present

## 2018-06-09 DIAGNOSIS — I129 Hypertensive chronic kidney disease with stage 1 through stage 4 chronic kidney disease, or unspecified chronic kidney disease: Secondary | ICD-10-CM | POA: Insufficient documentation

## 2018-06-09 NOTE — Progress Notes (Signed)
Diagnosis Diffuse large B-cell lymphoma of intra-abdominal lymph nodes (HCC) - Plan: CT CHEST W CONTRAST, CT Abdomen Pelvis W Contrast, CBC with Differential/Platelet, Comprehensive metabolic panel, Lactate dehydrogenase, Beta 2 microglobulin, serum  Staging Cancer Staging No matching staging information was found for the patient.  Assessment and Plan: 1.  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 IPI score of 3. She is in the high to intermediate risk group. Pt was previously followed by Dr. Talbert Cage.   Decreased her vincristine to 39m from 223mfrom cycle 2 onwards  due to worsening neuropathy.  6 cycles of R-CHOP from 03/01/17 to 06/14/17. Post treatment PET shows similar appearance of the bulky abdominal retroperitoneal lymphadenopathy. Index left para-aortic lymph node measured at 3.4 cm on the prior study measures 3.2 cm at the same level and in the same dimension today. SUV max = 2.4 for this lymph node compare to 3.7 previously. The index left external iliac lymph node measured previously at 0.9 cm short axis now measures 0.8 cm (image 135 series 4). SUV max =1.8 on today's exam compared to 2.0 previously. No evidence for progressive abdominopelvic lymphadenopathy on CT images. No new or progressive hypermetabolic disease in the abdomen or pelvis.  Patient is not a candidate for high dose therapy or transplant.  CT CAP done 06/07/2018 reviewed and showed  IMPRESSION: 1. Interval stability of retroperitoneal adenopathy. No new or progressive disease. Normal size spleen. 2. Moderate to large colonic stool volume, suggesting constipation. 3. Chronic findings include: Aortic Atherosclerosis (ICD10-I70.0). One vessel coronary atherosclerosis. Moderate sigmoid Diverticulosis  Labs done 06/05/2018 reviewed and showed WBC 2.5 HB 10.7 Plts 163,000.  Chemistries WNL with K+ 4.2 Cr 1.15 and  normal LFTs.  LDH 220.  Will repeat labs on RTC. Pt asymptomatic.  Pt had prior discussion by Dr. ZhTalbert Cageegarding aggressiveness of DLBCL and overall poor prognosis should she have residual disease.  2.  Leukopenia.  WBC 2.5 on labs done 06/05/2018.  WBC has been decreased dating back to 2018.  Pt had BM biopsy done 02/2017 that showed no evidence of DLBCL.  Will continue to monitor counts and repeat labs in 09/2018.    3.  Macrocytosis.  Will check B12, folate, MMA on RTC.  HB is 10.7.    4.  HTN.  BP is 150/64.  Follow-up with PCP  5.  DM.  Follow-up with PCP  Interval History:  Historical data obtained from the note dated 09/22/2017.  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  Pt had IPI score of 3. She is in the high to intermediate risk group. She was previously followed by Dr. ZhTalbert Cage She had decreased vincristine to 39m35mrom 2mg72mom cycle 2 onwards  due to worsening neuropathy.  6 cycles of R-CHOP from 03/01/17 to 06/14/17. Post treatment PET shows similar appearance of the bulky abdominal retroperitoneal lymphadenopathy. Index left para-aortic lymph node measured at 3.4 cm on the prior study measures 3.2 cm at the same level and in the same dimension today. SUV max = 2.4 for this lymph node compare to 3.7 previously. The index left external iliac lymph node measured previously at 0.9 cm short axis now measures 0.8 cm (image 135 series 4). SUV max =1.8 on today's exam compared to 2.0 previously. No evidence for  progressive abdominopelvic lymphadenopathy on CT images. No new or progressive hypermetabolic disease in the abdomen or pelvis.  Patient is not a candidate for high dose therapy or transplant.  Current Status:  Pt is seen today for follow-up.  She is here to go over CT scan.  Denies any fevers, chills, night sweats and had noted no adenopathy.    PATHOLOGY:    Lymph node biopsy: 01/31/17    Cytology: 01/31/17       Diffuse large B cell lymphoma ([place])   12/20/2016 Imaging    CT abd/pelvis: IMPRESSION: 1. Extensive abdominal, pelvic and retrocrural lymphadenopathy, most pronounced in the retroperitoneum, as detailed above. Findings are highly suspicious for lymphoproliferative disorder and further evaluation is strongly recommended at this time. 2. Colonic diverticulosis without evidence of acute diverticulitis at this time. 3. Aortic atherosclerosis. 4. Additional incidental findings, as above.    12/30/2016 PET scan    IMPRESSION: 1. Extensive hypermetabolic abdominal and pelvic adenopathy, including bulky retroperitoneal adenopathy lifting of the abdominal aorta away from the spine. One of the larger lymph nodes measures 8.4 cm in short axis with maximum SUV 25.0. 2. Small pleural focus of tumor in the left posteromedial chest, maximum SUV only 4.2. This represents the only thoracic involvement. 3. Other imaging findings of potential clinical significance: Atherosclerotic aortic arch. Mild cardiomegaly. Mild atelectasis in both lower lobes. Hyperdense left renal cysts as on prior CT. Mild left hydronephrosis likely due to extrinsic mass effect on the left ureter. Right greater than left degenerative arthropathy of the hips.    01/31/2017 Surgery    Exploratory lap Arnoldo Morale)     01/31/2017 Pathology Results    Lymph node biospy: DLBCL, high-grade, monoclonal kappa restricted.     02/07/2017 Initial Diagnosis    Diffuse large B cell lymphoma ([place])    02/14/2017 Echocardiogram    Pre-chemo ECHO: EF 55%.     03/01/2017 Bone Marrow Biopsy    No bone marrow involvement by lymphoma. Normay cytogenetics    03/01/2017 -  Chemotherapy    The patient had DOXOrubicin (ADRIAMYCIN) chemo injection 90 mg, 50 mg/m2 = 90 mg, Intravenous,  Once, 1 of 6 cycles  palonosetron (ALOXI) injection 0.25 mg, 0.25 mg, Intravenous,  Once, 1 of 6  cycles  pegfilgrastim (NEULASTA ONPRO KIT) injection 6 mg, 6 mg, Subcutaneous, Once, 1 of 6 cycles  vinCRIStine (ONCOVIN) 2 mg in sodium chloride 0.9 % 50 mL chemo infusion, 2 mg, Intravenous,  Once, 1 of 6 cycles  riTUXimab (RITUXAN) 700 mg in sodium chloride 0.9 % 250 mL (2.1875 mg/mL) chemo infusion, 375 mg/m2 = 700 mg, Intravenous,  Once, 1 of 6 cycles Dose modification: 375 mg/m2 (original dose 375 mg/m2, Cycle 1)  cyclophosphamide (CYTOXAN) 1,340 mg in sodium chloride 0.9 % 250 mL chemo infusion, 750 mg/m2 = 1,340 mg, Intravenous,  Once, 1 of 6 cycles  for chemotherapy treatment.      04/29/2017 PET scan    1. Overall marked improvement with prominent reduction in size and activity of the retroperitoneal and pelvic adenopathy. The dominant prior periaortic node has reduced in size from 8.4 cm in short axis to 3.4 cm, and from an SUV of 15.02 current SUV of 3.7. This current SUV qualifies as Deauville 4 disease. 2. Prior focus of pleural activity in the left hemithorax has resolved. 3. The hydronephrosis in the left kidney has resolved although there could still be some mild residual collecting system wall thickening. 4. Other imaging findings of potential clinical significance:  Aortic Atherosclerosis (ICD10-I70.0). Cardiomegaly. Prominent stool throughout the colon favors constipation. Small complex left renal cysts. Degenerative arthropathy of both hips. Lumbar spondylosis and degenerative disc disease.    06/27/2017 PET scan    IMPRESSION: 1. Slight interval decrease and FDG accumulation in the abdominal retroperitoneal lymphadenopathy in the small index left external iliac pelvic sidewall lymph node with minimal associated decrease in size on CT imaging. Based on FDG accumulation within the sites of disease on today's study, the Deauville score is 2. 2.  Aortic Atherosclerois (ICD10-170.0)    09/19/2017 PET scan    1. Similar isolated abdominal retroperitoneal  hypermetabolic adenopathy. 2. No new or progressive disease identified.      Problem List Patient Active Problem List   Diagnosis Date Noted  . Left leg swelling [M79.89] 05/09/2018  . Hematuria, gross [R31.0] 12/21/2017  . Malignant lymphoma, centroblastic type ([place]) [C83.30]   . Diffuse large B cell lymphoma ([place]) [C83.30] 02/07/2017  . Generalized lymphadenopathy [R59.1]   . Lymphadenopathy, abdominal [R59.0] 12/28/2016  . Iron deficiency anemia [D50.9] 12/28/2016  . Absolute anemia [D64.9] 12/09/2016  . Left knee pain [M25.562] 11/08/2016  . Primary localized osteoarthritis of right knee [M17.11] 05/12/2016  . Urinary incontinence [R32] 03/18/2016  . Annual physical exam [O35.00] 11/25/2015  . CKD (chronic kidney disease) stage 3, GFR 30-59 ml/min (HCC) [N18.3] 01/15/2015  . At high risk for falls [Z91.81] 05/04/2014  . PVD (peripheral vascular disease) ([place]) [I73.9] 06/08/2011  . ABNORMAL ELECTROCARDIOGRAM [R94.31] 09/19/2009  . Type 2 diabetes, diet controlled ([place]) [E11.9] 12/04/2007  . Essential hypertension [I10] 10/14/2006  . Osteoarthritis [M19.90] 10/14/2006  . Disorder of bone and cartilage [M89.9, M94.9] 10/14/2006  . COLONIC POLYPS, HX OF [Z86.010] 10/14/2006    Past Medical History Past Medical History:  Diagnosis Date  . Arthritis of knee    bilateral    . Chronic renal insufficiency   . Diabetes mellitus, type 2 ([place])   . GERD (gastroesophageal reflux disease)   . History of kidney stones   . Hypertension   . Iron deficiency anemia 12/28/2016  . Lymphadenopathy, abdominal 12/28/2016  . Osteopenia   . PAD (peripheral artery disease) ([place])   . Personal history of colonic polyps 3 years ago    colonoscopy     Past Surgical History Past Surgical History:  Procedure Laterality Date  . BIOPSY  12/16/2016   Procedure: BIOPSY;  Surgeon: Rogene Houston, MD;  [facility];  Service: Endoscopy;;  gastric polyp  . bunionectomy bilateral  feet    .  COLONOSCOPY N/A 12/16/2016   Procedure: COLONOSCOPY;  Surgeon: Rogene Houston, MD;  [facility];  Service: Endoscopy;  Laterality: N/A;  2:15  . DENTAL SURGERY    . ESOPHAGOGASTRODUODENOSCOPY N/A 12/16/2016   Procedure: ESOPHAGOGASTRODUODENOSCOPY (EGD);  Surgeon: Rogene Houston, MD;  [facility];  Service: Endoscopy;  Laterality: N/A;  . gum disease  2010   treated by Dr. Andree Elk   . LAPAROTOMY N/A 01/31/2017   Procedure: EXPLORATORY LAPAROTOMY;  Surgeon: Aviva Signs, MD;  Location: [facility];  Service: General;  Laterality: N/A;  . Left knee manipulation  under anasthesia due to contracture -  03/08/08   Dr. Aline Brochure   . left knee replacement  08/14/08   Dr. Aline Brochure   . LYMPH NODE BIOPSY  01/31/2017   Procedure: LYMPH NODE BIOPSY;  Surgeon: Aviva Signs, MD;  Location: [facility];  Service: General;;  . PORTACATH PLACEMENT Left 02/21/2017   Procedure:  INSERTION PORT-A-CATH;  Surgeon: Aviva Signs, MD;  Location: [facility];  Service: General;  Laterality: Left;  . TOTAL ABDOMINAL HYSTERECTOMY     bleeding   . TOTAL KNEE ARTHROPLASTY Right 05/12/2016   Procedure: TOTAL KNEE ARTHROPLASTY;  Surgeon: [name], MD;  Location: [facility];  Service: Orthopedics;  Laterality: Right;  . WISDOM TOOTH EXTRACTION      Family History Family History  Problem Relation Age of Onset  . Colon cancer Father   . Cancer Mother   . Stroke Mother   . Hypertension Brother   . Dementia Sister   . Multiple sclerosis Daughter   . Pseudotumor cerebri Daughter   . Neuropathy Daughter      Social History  reports that she quit smoking about 18 years ago. Her smoking use included cigarettes. She has a 7.50 pack-year smoking history. She has never used smokeless tobacco. She reports that she does not drink alcohol or use drugs.  Medications  Current Outpatient Medications:  .  acetaminophen (TYLENOL) 325 MG tablet, Take 650 mg by mouth every 4 (four) hours as needed for mild pain. , Disp: , Rfl:   .  aspirin EC 81 MG tablet, Take 81 mg by mouth daily., Disp: , Rfl:  .  lisinopril (PRINIVIL,ZESTRIL) 10 MG tablet, Take 10 mg by mouth daily., Disp: , Rfl:  .  Pediatric Multivitamins-Iron (FLINTSTONES PLUS IRON) chewable tablet, Chew 1 tablet by mouth 2 (two) times daily., Disp: , Rfl:  .  polyethylene glycol (MIRALAX / GLYCOLAX) packet, Take 17 g by mouth daily as needed for mild constipation., Disp: , Rfl:  .  UNABLE TO FIND, Pullups and chuks for use daily as needed, Disp: 50 each, Rfl: 5  Allergies Codeine and Fosamax [alendronate sodium]  Review of Systems Review of Systems - Oncology ROS negative other than fatigue   Physical Exam  Vitals Wt Readings from Last 3 Encounters:  05/08/18 170 lb (77.1 kg)  03/27/18 168 lb 1.3 oz (76.2 kg)  01/27/18 157 lb (71.2 kg)   Temp Readings from Last 3 Encounters:  06/09/18 97.9 F (36.6 C) (Oral)  06/05/18 98.2 F (36.8 C) (Oral)  03/27/18 98.4 F (36.9 C) (Temporal)   BP Readings from Last 3 Encounters:  06/09/18 (!) 150/64  06/05/18 127/77  05/08/18 130/82   Pulse Readings from Last 3 Encounters:  06/09/18 81  06/05/18 (!) 58  05/08/18 60   Constitutional: Well-developed, well-nourished, and in no distress.  Elderly female.   HENT: Head: Normocephalic and atraumatic.  Mouth/Throat: No oropharyngeal exudate. Mucosa moist. Eyes: Pupils are equal, round, and reactive to light. Conjunctivae are normal. No scleral icterus.  Neck: Normal range of motion. Neck supple. No JVD present.  Cardiovascular: Normal rate, regular rhythm and normal heart sounds.  Exam reveals no gallop and no friction rub.   No murmur heard. Pulmonary/Chest: Effort normal and breath sounds normal. No respiratory distress. No wheezes.No rales.  Abdominal: Soft. Bowel sounds are normal. No distension. There is no tenderness. There is no guarding.  Musculoskeletal: No edema or tenderness.  Lymphadenopathy: No cervical, axillary or supraclavicular  adenopathy.  Neurological: Alert and oriented to person, place, and time. No cranial nerve deficit.  Skin: Skin is warm and dry. No rash noted. No erythema. No pallor.  Psychiatric: Affect and judgment normal.   Labs No visits with results within 3 Day(s) from this visit.  Latest known visit with results is:  Infusion on 06/05/2018  Component Date Value Ref Range Status  .  WBC 06/05/2018 2.5* 4.0 - 10.5 K/uL Final  . RBC 06/05/2018 3.29* 3.87 - 5.11 MIL/uL Final  . Hemoglobin 06/05/2018 10.7* 12.0 - 15.0 g/dL Final  . HCT 06/05/2018 33.8* 36.0 - 46.0 % Final  . MCV 06/05/2018 102.7* 78.0 - 100.0 fL Final  . MCH 06/05/2018 32.5  26.0 - 34.0 pg Final  . MCHC 06/05/2018 31.7  30.0 - 36.0 g/dL Final  . RDW 06/05/2018 13.9  11.5 - 15.5 % Final  . Platelets 06/05/2018 163  150 - 400 K/uL Final  . Neutrophils Relative % 06/05/2018 65  % Final  . Neutro Abs 06/05/2018 1.6* 1.7 - 7.7 K/uL Final  . Lymphocytes Relative 06/05/2018 19  % Final  . Lymphs Abs 06/05/2018 0.5* 0.7 - 4.0 K/uL Final  . Monocytes Relative 06/05/2018 11  % Final  . Monocytes Absolute 06/05/2018 0.3  0.1 - 1.0 K/uL Final  . Eosinophils Relative 06/05/2018 5  % Final  . Eosinophils Absolute 06/05/2018 0.1  0.0 - 0.7 K/uL Final  . Basophils Relative 06/05/2018 0  % Final  . Basophils Absolute 06/05/2018 0.0  0.0 - 0.1 K/uL Final   Performed at [facility], [address], [place], [place] [address]  . Sodium 06/05/2018 142  135 - 145 mmol/L Final  . Potassium 06/05/2018 4.2  3.5 - 5.1 mmol/L Final  . Chloride 06/05/2018 105  98 - 111 mmol/L Final  . CO2 06/05/2018 29  22 - 32 mmol/L Final  . Glucose, Bld 06/05/2018 114* 70 - 99 mg/dL Final  . BUN 06/05/2018 22  8 - 23 mg/dL Final  . Creatinine, Ser 06/05/2018 1.15* 0.44 - 1.00 mg/dL Final  . Calcium 06/05/2018 9.1  8.9 - 10.3 mg/dL Final  . Total Protein 06/05/2018 6.4* 6.5 - 8.1 g/dL Final  . Albumin 06/05/2018 3.3* 3.5 - 5.0 g/dL Final  . AST 06/05/2018 30  15 -  41 U/L Final  . ALT 06/05/2018 21  0 - 44 U/L Final  . Alkaline Phosphatase 06/05/2018 77  38 - 126 U/L Final  . Total Bilirubin 06/05/2018 0.8  0.3 - 1.2 mg/dL Final  . GFR calc non Af Amer 06/05/2018 43* >60 mL/min Final  . GFR calc Af Amer 06/05/2018 50* >60 mL/min Final   Comment: (NOTE) The eGFR has been calculated using the CKD EPI equation. This calculation has not been validated in all clinical situations. eGFR's persistently <60 mL/min signify possible Chronic Kidney Disease.   Georgiann Hahn gap 06/05/2018 8  5 - 15 Final   Performed at [facility], [address], [place], [place] [address]  . LDH 06/05/2018 220* 98 - 192 U/L Final   Performed at [facility], [address], [place], [place] [address]     Pathology Orders Placed This Encounter  Procedures  . CT CHEST W CONTRAST    Standing Status:   Future    Standing Expiration Date:   06/09/2019    Order Specific Question:   If indicated for the ordered procedure, I authorize the administration of contrast media per Radiology protocol    Answer:   Yes    Order Specific Question:   Preferred imaging location?    Answer:   [facility]    Order Specific Question:   Radiology Contrast Protocol - do NOT remove file path    Answer:   [email or web address]  . CT Abdomen Pelvis W Contrast    Standing Status:   Future    Standing Expiration Date:  06/09/2019    Order Specific Question:   ** REASON FOR EXAM (FREE TEXT)    Answer:   follow-up of adenopathy    Order Specific Question:   If indicated for the ordered procedure, I authorize the administration of contrast media per Radiology protocol    Answer:   Yes    Order Specific Question:   Preferred imaging location?    Answer:   [facility]    Order Specific Question:   Is Oral Contrast requested for this exam?    Answer:   Yes, Per Radiology protocol    Order Specific Question:   Radiology Contrast Protocol - do NOT remove file path     Answer:   [email or web address]  . CBC with Differential/Platelet    Standing Status:   Future    Standing Expiration Date:   06/10/2019  . Comprehensive metabolic panel    Standing Status:   Future    Standing Expiration Date:   06/10/2019  . Lactate dehydrogenase    Standing Status:   Future    Standing Expiration Date:   06/10/2019  . Beta 2 microglobulin, serum    Standing Status:   Future    Standing Expiration Date:   06/10/2019       Zoila Shutter MD

## 2018-06-09 NOTE — Patient Instructions (Signed)
[facility] at [facility] Discharge Instructions  You saw Dr. Higgs today.   Thank you for choosing [facility] at [facility] to provide your oncology and hematology care.  To afford each patient quality time with our provider, please arrive at least 15 minutes before your scheduled appointment time.   If you have a lab appointment with the Cancer Center please come in thru the  Main Entrance and check in at the main information desk  You need to re-schedule your appointment should you arrive 10 or more minutes late.  We strive to give you quality time with our providers, and arriving late affects you and other patients whose appointments are after yours.  Also, if you no show three or more times for appointments you may be dismissed from the clinic at the providers discretion.     Again, thank you for choosing [facility].  Our hope is that these requests will decrease the amount of time that you wait before being seen by our physicians.       _____________________________________________________________  Should you have questions after your visit to [facility], please contact our office at [phone] between the hours of 8:00 a.m. and 4:30 p.m.  Voicemails left after 4:00 p.m. will not be returned until the following business day.  For prescription refill requests, have your pharmacy contact our office and allow 72 hours.    Cancer Center Support Programs:   > Cancer Support Group  2nd Tuesday of the month 1pm-2pm, Journey Room    

## 2018-06-12 ENCOUNTER — Other Ambulatory Visit (HOSPITAL_COMMUNITY): Payer: Medicare Other

## 2018-06-21 ENCOUNTER — Telehealth: Payer: Self-pay | Admitting: Family Medicine

## 2018-06-21 NOTE — Telephone Encounter (Signed)
[name] is calling wanting to ask a question regarding PT Going to the toilet, I asked her if [name] the Nurse could assist, and she said she would prefer to talk to the Dr

## 2018-06-21 NOTE — Telephone Encounter (Signed)
pls get info and document so I  Can process. Is this person one we can speak to

## 2018-06-29 NOTE — Telephone Encounter (Signed)
She is not on the DPR. Only daughter Davy Pique and son Enid Derry are listed

## 2018-07-04 ENCOUNTER — Encounter (HOSPITAL_COMMUNITY): Payer: Self-pay | Admitting: Physical Therapy

## 2018-08-09 ENCOUNTER — Encounter (HOSPITAL_COMMUNITY): Payer: Self-pay

## 2018-08-09 ENCOUNTER — Emergency Department (HOSPITAL_COMMUNITY): Payer: Medicare Other

## 2018-08-09 ENCOUNTER — Inpatient Hospital Stay (HOSPITAL_COMMUNITY)
Admission: EM | Admit: 2018-08-09 | Discharge: 2018-08-18 | DRG: 064 | Disposition: E | Payer: Medicare Other | Attending: Internal Medicine | Admitting: Internal Medicine

## 2018-08-09 DIAGNOSIS — Z7982 Long term (current) use of aspirin: Secondary | ICD-10-CM

## 2018-08-09 DIAGNOSIS — I619 Nontraumatic intracerebral hemorrhage, unspecified: Secondary | ICD-10-CM

## 2018-08-09 DIAGNOSIS — Z8719 Personal history of other diseases of the digestive system: Secondary | ICD-10-CM

## 2018-08-09 DIAGNOSIS — G936 Cerebral edema: Secondary | ICD-10-CM | POA: Diagnosis present

## 2018-08-09 DIAGNOSIS — Z66 Do not resuscitate: Secondary | ICD-10-CM | POA: Diagnosis not present

## 2018-08-09 DIAGNOSIS — G935 Compression of brain: Secondary | ICD-10-CM | POA: Diagnosis present

## 2018-08-09 DIAGNOSIS — M8580 Other specified disorders of bone density and structure, unspecified site: Secondary | ICD-10-CM | POA: Diagnosis present

## 2018-08-09 DIAGNOSIS — I10 Essential (primary) hypertension: Secondary | ICD-10-CM | POA: Diagnosis present

## 2018-08-09 DIAGNOSIS — Z87442 Personal history of urinary calculi: Secondary | ICD-10-CM | POA: Diagnosis not present

## 2018-08-09 DIAGNOSIS — E861 Hypovolemia: Secondary | ICD-10-CM | POA: Diagnosis present

## 2018-08-09 DIAGNOSIS — R001 Bradycardia, unspecified: Secondary | ICD-10-CM | POA: Diagnosis present

## 2018-08-09 DIAGNOSIS — Z7189 Other specified counseling: Secondary | ICD-10-CM | POA: Diagnosis not present

## 2018-08-09 DIAGNOSIS — R402222 Coma scale, best verbal response, incomprehensible words, at arrival to emergency department: Secondary | ICD-10-CM | POA: Diagnosis present

## 2018-08-09 DIAGNOSIS — E1151 Type 2 diabetes mellitus with diabetic peripheral angiopathy without gangrene: Secondary | ICD-10-CM | POA: Diagnosis present

## 2018-08-09 DIAGNOSIS — N183 Chronic kidney disease, stage 3 (moderate): Secondary | ICD-10-CM | POA: Diagnosis present

## 2018-08-09 DIAGNOSIS — Z823 Family history of stroke: Secondary | ICD-10-CM

## 2018-08-09 DIAGNOSIS — R402322 Coma scale, best motor response, extension, at arrival to emergency department: Secondary | ICD-10-CM | POA: Diagnosis present

## 2018-08-09 DIAGNOSIS — C8330 Diffuse large B-cell lymphoma, unspecified site: Secondary | ICD-10-CM

## 2018-08-09 DIAGNOSIS — I129 Hypertensive chronic kidney disease with stage 1 through stage 4 chronic kidney disease, or unspecified chronic kidney disease: Secondary | ICD-10-CM | POA: Diagnosis present

## 2018-08-09 DIAGNOSIS — I629 Nontraumatic intracranial hemorrhage, unspecified: Secondary | ICD-10-CM | POA: Diagnosis not present

## 2018-08-09 DIAGNOSIS — Z888 Allergy status to other drugs, medicaments and biological substances status: Secondary | ICD-10-CM

## 2018-08-09 DIAGNOSIS — E232 Diabetes insipidus: Secondary | ICD-10-CM | POA: Diagnosis not present

## 2018-08-09 DIAGNOSIS — J9600 Acute respiratory failure, unspecified whether with hypoxia or hypercapnia: Secondary | ICD-10-CM | POA: Diagnosis present

## 2018-08-09 DIAGNOSIS — Z8249 Family history of ischemic heart disease and other diseases of the circulatory system: Secondary | ICD-10-CM

## 2018-08-09 DIAGNOSIS — R402112 Coma scale, eyes open, never, at arrival to emergency department: Secondary | ICD-10-CM | POA: Diagnosis present

## 2018-08-09 DIAGNOSIS — I6200 Nontraumatic subdural hemorrhage, unspecified: Principal | ICD-10-CM | POA: Diagnosis present

## 2018-08-09 DIAGNOSIS — K219 Gastro-esophageal reflux disease without esophagitis: Secondary | ICD-10-CM | POA: Diagnosis present

## 2018-08-09 DIAGNOSIS — E119 Type 2 diabetes mellitus without complications: Secondary | ICD-10-CM

## 2018-08-09 DIAGNOSIS — Z79899 Other long term (current) drug therapy: Secondary | ICD-10-CM | POA: Diagnosis not present

## 2018-08-09 DIAGNOSIS — Z96653 Presence of artificial knee joint, bilateral: Secondary | ICD-10-CM | POA: Diagnosis present

## 2018-08-09 DIAGNOSIS — K08409 Partial loss of teeth, unspecified cause, unspecified class: Secondary | ICD-10-CM | POA: Diagnosis present

## 2018-08-09 DIAGNOSIS — S065X9A Traumatic subdural hemorrhage with loss of consciousness of unspecified duration, initial encounter: Secondary | ICD-10-CM | POA: Diagnosis not present

## 2018-08-09 DIAGNOSIS — Z800 Family history of malignant neoplasm of digestive organs: Secondary | ICD-10-CM

## 2018-08-09 DIAGNOSIS — Z515 Encounter for palliative care: Secondary | ICD-10-CM | POA: Diagnosis not present

## 2018-08-09 DIAGNOSIS — E1122 Type 2 diabetes mellitus with diabetic chronic kidney disease: Secondary | ICD-10-CM | POA: Diagnosis present

## 2018-08-09 DIAGNOSIS — S065XAA Traumatic subdural hemorrhage with loss of consciousness status unknown, initial encounter: Secondary | ICD-10-CM

## 2018-08-09 DIAGNOSIS — Z885 Allergy status to narcotic agent status: Secondary | ICD-10-CM

## 2018-08-09 DIAGNOSIS — Z87891 Personal history of nicotine dependence: Secondary | ICD-10-CM

## 2018-08-09 DIAGNOSIS — Z90710 Acquired absence of both cervix and uterus: Secondary | ICD-10-CM

## 2018-08-09 LAB — I-STAT CHEM 8, ED
BUN: 27 mg/dL — AB (ref 8–23)
CREATININE: 1.2 mg/dL — AB (ref 0.44–1.00)
Calcium, Ion: 1.25 mmol/L (ref 1.15–1.40)
Chloride: 107 mmol/L (ref 98–111)
Glucose, Bld: 125 mg/dL — ABNORMAL HIGH (ref 70–99)
HEMATOCRIT: 31 % — AB (ref 36.0–46.0)
HEMOGLOBIN: 10.5 g/dL — AB (ref 12.0–15.0)
Potassium: 3.9 mmol/L (ref 3.5–5.1)
SODIUM: 143 mmol/L (ref 135–145)
TCO2: 27 mmol/L (ref 22–32)

## 2018-08-09 LAB — BLOOD GAS, ARTERIAL
Acid-Base Excess: 1.3 mmol/L (ref 0.0–2.0)
Bicarbonate: 25.8 mmol/L (ref 20.0–28.0)
Drawn by: 28459
FIO2: 100
LHR: 18 {breaths}/min
O2 Saturation: 99.8 %
PATIENT TEMPERATURE: 37
PCO2 ART: 36.3 mmHg (ref 32.0–48.0)
PEEP: 5 cmH2O
VT: 510 mL
pH, Arterial: 7.451 — ABNORMAL HIGH (ref 7.350–7.450)
pO2, Arterial: 448 mmHg — ABNORMAL HIGH (ref 83.0–108.0)

## 2018-08-09 LAB — CBC WITH DIFFERENTIAL/PLATELET
ABS IMMATURE GRANULOCYTES: 0.01 10*3/uL (ref 0.00–0.07)
Basophils Absolute: 0 10*3/uL (ref 0.0–0.1)
Basophils Relative: 1 %
EOS ABS: 0.1 10*3/uL (ref 0.0–0.5)
Eosinophils Relative: 3 %
HEMATOCRIT: 34 % — AB (ref 36.0–46.0)
Hemoglobin: 10.7 g/dL — ABNORMAL LOW (ref 12.0–15.0)
IMMATURE GRANULOCYTES: 0 %
Lymphocytes Relative: 22 %
Lymphs Abs: 0.8 10*3/uL (ref 0.7–4.0)
MCH: 33 pg (ref 26.0–34.0)
MCHC: 31.5 g/dL (ref 30.0–36.0)
MCV: 104.9 fL — AB (ref 80.0–100.0)
Monocytes Absolute: 0.5 10*3/uL (ref 0.1–1.0)
Monocytes Relative: 14 %
NEUTROS PCT: 60 %
Neutro Abs: 2.2 10*3/uL (ref 1.7–7.7)
Platelets: 193 10*3/uL (ref 150–400)
RBC: 3.24 MIL/uL — ABNORMAL LOW (ref 3.87–5.11)
RDW: 13.8 % (ref 11.5–15.5)
WBC: 3.7 10*3/uL — ABNORMAL LOW (ref 4.0–10.5)
nRBC: 0 % (ref 0.0–0.2)

## 2018-08-09 LAB — URINALYSIS, ROUTINE W REFLEX MICROSCOPIC
Bilirubin Urine: NEGATIVE
Glucose, UA: NEGATIVE mg/dL
Ketones, ur: NEGATIVE mg/dL
Leukocytes, UA: NEGATIVE
Nitrite: NEGATIVE
Protein, ur: 30 mg/dL — AB
SPECIFIC GRAVITY, URINE: 1.017 (ref 1.005–1.030)
pH: 5 (ref 5.0–8.0)

## 2018-08-09 LAB — COMPREHENSIVE METABOLIC PANEL
ALK PHOS: 84 U/L (ref 38–126)
ALT: 32 U/L (ref 0–44)
AST: 39 U/L (ref 15–41)
Albumin: 3.4 g/dL — ABNORMAL LOW (ref 3.5–5.0)
Anion gap: 6 (ref 5–15)
BILIRUBIN TOTAL: 0.7 mg/dL (ref 0.3–1.2)
BUN: 25 mg/dL — AB (ref 8–23)
CALCIUM: 9.3 mg/dL (ref 8.9–10.3)
CO2: 28 mmol/L (ref 22–32)
Chloride: 110 mmol/L (ref 98–111)
Creatinine, Ser: 1.13 mg/dL — ABNORMAL HIGH (ref 0.44–1.00)
GFR calc Af Amer: 51 mL/min — ABNORMAL LOW (ref >60)
GFR, EST NON AFRICAN AMERICAN: 44 mL/min — AB (ref >60)
Glucose, Bld: 132 mg/dL — ABNORMAL HIGH (ref 70–99)
Potassium: 3.9 mmol/L (ref 3.5–5.1)
Sodium: 144 mmol/L (ref 135–145)
TOTAL PROTEIN: 6.5 g/dL (ref 6.5–8.1)

## 2018-08-09 LAB — PROTIME-INR
INR: 0.96
Prothrombin Time: 12.7 seconds (ref 11.4–15.2)

## 2018-08-09 LAB — I-STAT TROPONIN, ED: Troponin i, poc: 0.01 ng/mL (ref 0.00–0.08)

## 2018-08-09 LAB — I-STAT CG4 LACTIC ACID, ED: Lactic Acid, Venous: 1 mmol/L (ref 0.5–1.9)

## 2018-08-09 LAB — AMMONIA: AMMONIA: 15 umol/L (ref 9–35)

## 2018-08-09 LAB — CBG MONITORING, ED: Glucose-Capillary: 107 mg/dL — ABNORMAL HIGH (ref 70–99)

## 2018-08-09 MED ORDER — ETOMIDATE 2 MG/ML IV SOLN
20.0000 mg | Freq: Once | INTRAVENOUS | Status: AC
Start: 2018-08-09 — End: 2018-08-09
  Administered 2018-08-09: 20 mg via INTRAVENOUS

## 2018-08-09 MED ORDER — SUCCINYLCHOLINE CHLORIDE 20 MG/ML IJ SOLN
100.0000 mg | Freq: Once | INTRAMUSCULAR | Status: AC
  Administered 2018-08-09: 100 mg via INTRAVENOUS

## 2018-08-09 MED ORDER — MIDAZOLAM HCL 2 MG/2ML IJ SOLN
INTRAMUSCULAR | Status: AC
  Administered 2018-08-09: 2 mg via INTRAVENOUS
  Filled 2018-08-09: qty 2

## 2018-08-09 MED ORDER — LABETALOL HCL 5 MG/ML IV SOLN
20.0000 mg | Freq: Once | INTRAVENOUS | Status: AC
  Administered 2018-08-09: 20 mg via INTRAVENOUS
  Filled 2018-08-09: qty 4

## 2018-08-09 MED ORDER — MIDAZOLAM HCL 2 MG/2ML IJ SOLN
2.0000 mg | Freq: Once | INTRAMUSCULAR | Status: AC
  Administered 2018-08-09: 2 mg via INTRAVENOUS

## 2018-08-09 MED ORDER — CLEVIDIPINE BUTYRATE 0.5 MG/ML IV EMUL
0.0000 mg/h | INTRAVENOUS | Status: DC
  Administered 2018-08-09: 1 mg/h via INTRAVENOUS
  Filled 2018-08-09 (×2): qty 50

## 2018-08-09 MED ORDER — IOPAMIDOL (ISOVUE-370) INJECTION 76%
100.0000 mL | Freq: Once | INTRAVENOUS | Status: AC | PRN
  Administered 2018-08-09: 100 mL via INTRAVENOUS

## 2018-08-09 NOTE — ED Triage Notes (Signed)
Found by EMS unresponsive. Don't know how long she's been down. States her left pupil was 4 and right was 2.   CBG 639 BP 432 systolic Unknown allergies

## 2018-08-09 NOTE — ED Notes (Signed)
Dr. Gilford Raid ordered 20 of Etomidate and 100 of Succ.  Preparing to intubate  Drugs in at [facility]

## 2018-08-09 NOTE — ED Notes (Signed)
Pt successfully intubated 23 at the lip.

## 2018-08-09 NOTE — ED Notes (Signed)
RN attempted to call family.  No answer with first 2 numbers listed.  Made contact with daughter, Loretha Stapler, [phone], who states daughter Annalisse Minkoff that lives locally is in route to [facility].

## 2018-08-09 NOTE — ED Provider Notes (Signed)
Patient signed out to me from Dr. Gilford Raid.  84 year old female who had an intraparenchymal bleed complicated by herniation.  She is currently intubated but otherwise hemodynamically stable.  Expectation is that the patient will be transferred to [facility] request.  Just received call from [facility] ICU that they are questioning the necessity for patient to be transferred.  They wanted me to confirm with family their expectations prior to excepting the patient.  I did discuss with the family that the patient would not be candidate for any heroic interventions and that her likelihood of recovery was minimal.  They wish to talk to some other family members were traveling to see how they should proceed.  Clinical Course as of Aug 10 1018  Wed Aug 09, 2018  2356 Family talked together on the phone in decided it would be appropriate if they stayed at [facility].  I contacted the hospitalist Dr. Maudie Mercury who will proceed with admitting the patient.   [MB]    Clinical Course User Index [MB] Hayden Rasmussen, MD      Hayden Rasmussen, MD 08/10/18 1019

## 2018-08-09 NOTE — ED Provider Notes (Addendum)
[place] EMERGENCY DEPARTMENT Provider Note   CSN: [id number] Arrival date & time: 07/28/2018  1747     History   Chief Complaint Chief Complaint  Patient presents with  . UNRESPONSIVE    HPI [name] is a 83 y.o. female.  Pt presents to the ED today unresponsive.  Pt's neighbors have been trying to call her today and have been unable to get ahold of her.  She has a medic alert necklace which called 911.  When EMS arrived, they found her slumped over on a stool.  They had to break into the house to get to her.  She had a pulse, but was not effectively breathing.  CBG 139.  SBP 170.  Pt was bagged en route.  Pt unable to give any hx.  Pt is not on any blood thinners.     Past Medical History:  Diagnosis Date  . Arthritis of knee    bilateral    . Chronic renal insufficiency   . Diabetes mellitus, type 2 (HCC)   . GERD (gastroesophageal reflux disease)   . History of kidney stones   . Hypertension   . Iron deficiency anemia 12/28/2016  . Lymphadenopathy, abdominal 12/28/2016  . Osteopenia   . PAD (peripheral artery disease) (HCC)   . Personal history of colonic polyps 3 years ago    colonoscopy     Patient Active Problem List   Diagnosis Date Noted  . SDH (subdural hematoma) (HCC)   . Uncal herniation (HCC)   . Advanced care planning/counseling discussion   . Goals of care, counseling/discussion   . Intracranial hemorrhage (HCC) 08/06/2018  . Left leg swelling 05/09/2018  . Hematuria, gross 12/21/2017  . Malignant lymphoma, centroblastic type (HCC)   . Diffuse large B cell lymphoma (HCC) 02/07/2017  . Generalized lymphadenopathy   . Lymphadenopathy, abdominal 12/28/2016  . Iron deficiency anemia 12/28/2016  . Absolute anemia 12/09/2016  . Left knee pain 11/08/2016  . Primary localized osteoarthritis of right knee 05/12/2016  . Urinary incontinence 03/18/2016  . Annual physical exam 11/25/2015  . CKD (chronic kidney disease) stage 3, GFR 30-59 ml/min  (HCC) 01/15/2015  . At high risk for falls 05/04/2014  . PVD (peripheral vascular disease) (HCC) 06/08/2011  . ABNORMAL ELECTROCARDIOGRAM 09/19/2009  . Type 2 diabetes, diet controlled (HCC) 12/04/2007  . Essential hypertension 10/14/2006  . Osteoarthritis 10/14/2006  . Disorder of bone and cartilage 10/14/2006  . COLONIC POLYPS, HX OF 10/14/2006    Past Surgical History:  Procedure Laterality Date  . BIOPSY  12/16/2016   Procedure: BIOPSY;  Surgeon: Najeeb U Rehman, MD;  Location: [facility];  Service: Endoscopy;;  gastric polyp  . bunionectomy bilateral  feet    . COLONOSCOPY N/A 12/16/2016   Procedure: COLONOSCOPY;  Surgeon: Najeeb U Rehman, MD;  Location: [facility];  Service: Endoscopy;  Laterality: N/A;  2:15  . DENTAL SURGERY    . ESOPHAGOGASTRODUODENOSCOPY N/A 12/16/2016   Procedure: ESOPHAGOGASTRODUODENOSCOPY (EGD);  Surgeon: Najeeb U Rehman, MD;  Location: [facility];  Service: Endoscopy;  Laterality: N/A;  . gum disease  2010   treated by Dr. Adams   . LAPAROTOMY N/A 01/31/2017   Procedure: EXPLORATORY LAPAROTOMY;  Surgeon: Mark Jenkins, MD;  Location: [facility] ORS;  Service: General;  Laterality: N/A;  . Left knee manipulation  under anasthesia due to contracture -  03/08/08   Dr. Harrison   . left knee replacement  08/14/08   Dr. Harrison   .   LYMPH NODE BIOPSY  01/31/2017   Procedure: LYMPH NODE BIOPSY;  Surgeon: Mark Jenkins, MD;  Location: [facility] ORS;  Service: General;;  . PORTACATH PLACEMENT Left 02/21/2017   Procedure: INSERTION PORT-A-CATH;  Surgeon: Jenkins, Mark, MD;  Location: [facility] OR[facility];  Service: General;  Laterality: Left;  . TOTAL ABDOMINAL HYSTERECTOMY     bleeding   . TOTAL KNEE ARTHROPLASTY Right 05/12/2016   Procedure: TOTAL KNEE ARTHROPLASTY;  Surgeon: Daniel F Murphy, MD;  Location: [facility] OR;  Service: Orthopedics;  Laterality: Right;  . WISDOM TOOTH EXTRACTION       OB History   None      Home Medications    Prior to Admission medications   Medication Sig  Start Date End Date Taking? Authorizing Provider  acetaminophen (TYLENOL) 325 MG tablet Take 325 mg by mouth daily.    Yes [provider]  aspirin EC 81 MG tablet Take 81 mg by mouth daily.   Yes [provider]  CALCIUM PO Take 1 tablet by mouth daily.   Yes [provider]  diphenhydrAMINE (BENADRYL) 25 MG tablet Take 25 mg by mouth daily as needed for allergies.   Yes [provider]  Pediatric Multivitamins-Iron (FLINTSTONES PLUS IRON) chewable tablet Chew 1 tablet by mouth 2 (two) times daily. 12/16/16  Yes Rehman, Najeeb U, MD  polyethylene glycol (MIRALAX / GLYCOLAX) packet Take 17 g by mouth daily as needed for mild constipation.   Yes [provider]  UNABLE TO FIND Pullups and chuks for use daily as needed 12/21/17   Simpson, Margaret E, MD  allopurinol (ZYLOPRIM) 300 MG tablet Take 1 tablet (300 mg total) by mouth daily. 02/10/17 09/22/17  Dawson, Gretchen W, NP  prochlorperazine (COMPAZINE) 10 MG tablet Take 1 tablet (10 mg total) by mouth every 6 (six) hours as needed (Nausea or vomiting). Patient not taking: Reported on 09/22/2017 02/10/17 09/22/17  Dawson, Gretchen W, NP    Family History Family History  Problem Relation Age of Onset  . Colon cancer Father   . Cancer Mother   . Stroke Mother   . Hypertension Brother   . Dementia Sister   . Multiple sclerosis Daughter   . Pseudotumor cerebri Daughter   . Neuropathy Daughter     Social History Social History   Tobacco Use  . Smoking status: Former Smoker    Packs/day: 0.25    Years: 30.00    Pack years: 7.50    Types: Cigarettes    Last attempt to quit: 10/19/1999    Years since quitting: 18.8  . Smokeless tobacco: Never Used  Substance Use Topics  . Alcohol use: No  . Drug use: No     Allergies   Codeine and Fosamax [alendronate sodium]   Review of Systems Review of Systems  Unable to perform ROS: Mental status change     Physical Exam Updated Vital Signs BP  131/62   Pulse 63   Temp 98.4 F (36.9 C) (Rectal)   Resp 14   Ht 5' 8" (1.727 m)   Wt 78.4 kg   SpO2 100%   BMI 26.28 kg/m   Physical Exam  Constitutional: She appears well-developed and well-nourished.  HENT:  Head: Normocephalic and atraumatic.  Right Ear: External ear normal.  Left Ear: External ear normal.  Nose: Nose normal.  Mouth/Throat: Oropharynx is clear and moist.  Eyes: Conjunctivae are normal.  Neck: Normal range of motion. Neck supple.  Cardiovascular: Normal rate, regular rhythm, normal heart sounds and   intact distal pulses.  Pulmonary/Chest: Bradypnea noted.  Abdominal: Soft. Bowel sounds are normal.  Musculoskeletal: Normal range of motion.  Neurological: She is unresponsive.  Skin: Skin is warm. Capillary refill takes less than 2 seconds.  Psychiatric:  Unable to assess  Nursing note and vitals reviewed.    ED Treatments / Results  Labs (all labs ordered are listed, but only abnormal results are displayed) Labs Reviewed  CBC WITH DIFFERENTIAL/PLATELET - Abnormal; Notable for the following components:      Result Value   WBC 3.7 (*)    RBC 3.24 (*)    Hemoglobin 10.7 (*)    HCT 34.0 (*)    MCV 104.9 (*)    All other components within normal limits  COMPREHENSIVE METABOLIC PANEL - Abnormal; Notable for the following components:   Glucose, Bld 132 (*)    BUN 25 (*)    Creatinine, Ser 1.13 (*)    Albumin 3.4 (*)    GFR calc non Af Amer 44 (*)    GFR calc Af Amer 51 (*)    All other components within normal limits  URINALYSIS, ROUTINE W REFLEX MICROSCOPIC - Abnormal; Notable for the following components:   APPearance HAZY (*)    Hgb urine dipstick SMALL (*)    Protein, ur 30 (*)    Bacteria, UA RARE (*)    All other components within normal limits  BLOOD GAS, ARTERIAL - Abnormal; Notable for the following components:   pH, Arterial 7.451 (*)    pO2, Arterial 448 (*)    All other components within normal limits  CBC - Abnormal; Notable for  the following components:   RBC 3.26 (*)    Hemoglobin 10.4 (*)    HCT 32.9 (*)    MCV 100.9 (*)    All other components within normal limits  COMPREHENSIVE METABOLIC PANEL - Abnormal; Notable for the following components:   Sodium 146 (*)    Chloride 113 (*)    Glucose, Bld 187 (*)    Creatinine, Ser 1.09 (*)    Total Protein 6.0 (*)    Albumin 3.0 (*)    AST 46 (*)    GFR calc non Af Amer 46 (*)    GFR calc Af Amer 54 (*)    All other components within normal limits  BLOOD GAS, ARTERIAL - Abnormal; Notable for the following components:   pH, Arterial 7.527 (*)    pCO2 arterial 28.5 (*)    pO2, Arterial 261 (*)    All other components within normal limits  CBG MONITORING, ED - Abnormal; Notable for the following components:   Glucose-Capillary 107 (*)    All other components within normal limits  I-STAT CHEM 8, ED - Abnormal; Notable for the following components:   BUN 27 (*)    Creatinine, Ser 1.20 (*)    Glucose, Bld 125 (*)    Hemoglobin 10.5 (*)    HCT 31.0 (*)    All other components within normal limits  MRSA PCR SCREENING  AMMONIA  PROTIME-INR  I-STAT CG4 LACTIC ACID, ED  I-STAT TROPONIN, ED    EKG EKG Interpretation  Date/Time:  Wednesday August 09 2018 17:52:46 EDT Ventricular Rate:  82 PR Interval:    QRS Duration: 99 QT Interval:  393 QTC Calculation: 459 R Axis:   -43 Text Interpretation:  Sinus arrhythmia Borderline prolonged PR interval Left axis deviation Abnormal R-wave progression, early transition Nonspecific T abnrm, anterolateral leads No significant change since last tracing   Confirmed by Haviland, Julie ([id number]) on 07/22/2018 8:49:16 PM   Radiology Ct Head Wo Contrast  Result Date: 07/19/2018 CLINICAL DATA:  83-year-old female found by EMS unresponsive. Personal history of large B-cell lymphoma. EXAM: CT HEAD WITHOUT CONTRAST TECHNIQUE: Contiguous axial images were obtained from the base of the skull through the vertex without intravenous  contrast. COMPARISON:  PET-CT 09/19/2017. FINDINGS: Brain: Large superior right hemisphere intra-axial hemorrhage with surrounding edema. The hyperdense intra-axial blood products encompass 58 x 59 x 53 millimeters for an estimated intra-axial blood volume of 91 milliliters. There is extension across midline via the body of the corpus callosum and extension into the ventricular system. Furthermore, there is a wide ranging mixed density right side subdural hematoma measuring up to 18 millimeters in thickness which might also have extended from the intra-axial hemorrhage. There is a small volume of parafalcine subdural blood. There is a small to moderate volume of subdural blood layering along the clivus. Subsequent severe intracranial mass effect with leftward midline shift up to 23 millimeters. There is right uncal herniation. There is combined trapping and effacement of the ventricular system with a small to moderate volume of intraventricular hemorrhage. There is downward mass effect on the brain with subtotal effacement of the basilar cisterns. No tonsillar herniation at this time. No superimposed acute cortically based infarct is evident. Vascular: Calcified atherosclerosis at the skull base. Skull: Hyperostosis. No skull fracture or suspicious osseous lesion identified. Sinuses/Orbits: Visualized paranasal sinuses and mastoids are stable and well pneumatized. Other: Orbit and scalp soft tissues appear negative. Fluid in the visible pharynx. IMPRESSION: 1. Severe intracranial hemorrhage and mass effect including right uncal herniation, subtotal effacement of basilar cisterns, and leftward midline shift of 23 mm. Trapped and dilated left lateral ventricle. 2. Relatively large (91 mL) intra-axial hemorrhage epicenter in the superior right hemisphere which I suspect may have secondarily extended into the right subdural space and ventricular system. Relatively large (18 mm thick) mixed density right subdural  hematoma. 3. Critical Value/emergent results were called by telephone at the time of interpretation on 08/04/2018 at 8:07 pm to Dr. JULIE HAVILAND , who verbally acknowledged these results. Electronically Signed   By: H  Hall M.D.   On: 07/21/2018 20:11   Ct Angio Chest Pe W And/or Wo Contrast  Result Date: 07/18/2018 CLINICAL DATA:  83-year-old female found by EMS unresponsive. Severe acute intracranial hemorrhage. Personal history of large B-cell lymphoma. EXAM: CT ANGIOGRAPHY CHEST WITH CONTRAST TECHNIQUE: Multidetector CT imaging of the chest was performed using the standard protocol during bolus administration of intravenous contrast. Multiplanar CT image reconstructions and MIPs were obtained to evaluate the vascular anatomy. CONTRAST:  100mL ISOVUE-370 IOPAMIDOL (ISOVUE-370) INJECTION 76% COMPARISON:  Restaging CT chest, abdomen and Pelvis 06/07/2018 FINDINGS: Cardiovascular: Good contrast bolus timing in the pulmonary arterial tree. Mild respiratory motion. No focal filling defect identified in the pulmonary arteries to suggest acute pulmonary embolism. Suboptimal evaluation of the distal lower lobe branches due to motion. Stable cardiomegaly and tortuosity of the thoracic aorta. No aortic contrast on this exam. Calcified aortic atherosclerosis. Stable left chest Port-A-Cath. Enteric tube in place courses into the stomach. Mediastinum/Nodes: No mediastinal lymphadenopathy. Lungs/Pleura: Intubated. Endotracheal tube tip terminates above the carina. Lower lung volumes. Left greater than right lower lobe and lingula atelectasis. The major airways are patent. No pleural effusion or consolidation. No pulmonary nodule identified. Upper Abdomen: Enteric tube visible to the body of the stomach. Negative visible liver, spleen. Other visible upper abdominal viscera appear stable including   small left renal upper pole cyst. Musculoskeletal: Degenerative changes throughout the visible spine. No acute osseous  abnormality identified. Review of the MIP images confirms the above findings. IMPRESSION: 1.  Negative for acute pulmonary embolus. 2. Endotracheal tube and enteric tube appear well placed. 3. Lower lung volumes with mild atelectasis. 4. No other acute findings in the chest or visible upper abdomen. Electronically Signed   By: H  Hall M.D.   On: 07/24/2018 20:16   Dg Chest Portable 1 View  Result Date: 08/08/2018 CLINICAL DATA:  Intubated EXAM: PORTABLE CHEST 1 VIEW COMPARISON:  CT chest 06/07/2018, radiograph 03/18/2016, PET-CT 09/19/2017 FINDINGS: Endotracheal tube tip is about 16 mm superior to carina. Esophageal tube tip below the diaphragm but non included. Left-sided central venous port tip over the SVC. Mild cardiomegaly with suspected small left pleural effusion. Atelectasis or minimal infiltrate at the left base. Aortic atherosclerosis. No pneumothorax. IMPRESSION: 1. Endotracheal tube tip about 16 mm superior to the carina. Esophageal tube tip below the diaphragm but non included 2. Borderline to mild cardiomegaly. Suspected trace left pleural effusion with streaky atelectasis or minimal infiltrate at the left base. Electronically Signed   By: Kim  Fujinaga M.D.   On: 07/29/2018 18:30    Procedures Procedure Name: Intubation Date/Time: 07/27/2018 7:11 PM Performed by: Haviland, Julie, MD Pre-anesthesia Checklist: Patient identified, Emergency Drugs available, Suction available, Patient being monitored and Timeout performed Oxygen Delivery Method: Ambu bag Preoxygenation: Pre-oxygenation with 100% oxygen Induction Type: Rapid sequence Ventilation: Mask ventilation without difficulty Laryngoscope Size: Glidescope and 3 Tube size: 7.5 mm Number of attempts: 1 Placement Confirmation: ETT inserted through vocal cords under direct vision,  Positive ETCO2 and Breath sounds checked- equal and bilateral Secured at: 23 cm Tube secured with: ETT holder Dental Injury: Teeth and Oropharynx as per  pre-operative assessment       (including critical care time)  Medications Ordered in ED Medications  labetalol (NORMODYNE,TRANDATE) injection 20 mg (20 mg Intravenous Given 07/19/2018 2014)    And  clevidipine (CLEVIPREX) infusion 0.5 mg/mL (0 mg/hr Intravenous Stopped 08/10/18 0209)  dextrose 5 %-0.9 % sodium chloride infusion ( Intravenous Rate/Dose Verify 08/10/18 0243)  insulin aspart (novoLOG) injection 0-9 Units (1 Units Subcutaneous Given 08/10/18 1207)  chlorhexidine gluconate (MEDLINE KIT) (PERIDEX) 0.12 % solution 15 mL (15 mLs Mouth Rinse Given 08/10/18 0803)  MEDLINE mouth rinse (15 mLs Mouth Rinse Given 08/10/18 1419)  lactated ringers infusion ( Intravenous New Bag/Given 08/10/18 1504)  etomidate (AMIDATE) injection 20 mg (20 mg Intravenous Given 07/18/2018 1745)  succinylcholine (ANECTINE) injection 100 mg (100 mg Intravenous Given 08/03/2018 1745)  midazolam (VERSED) injection 2 mg (2 mg Intravenous Given 08/11/2018 1755)  iopamidol (ISOVUE-370) 76 % injection 100 mL (100 mLs Intravenous Contrast Given 08/02/2018 1939)     Initial Impression / Assessment and Plan / ED Course  I have reviewed the triage vital signs and the nursing notes.  Pertinent labs & imaging results that were available during my care of the patient were reviewed by me and considered in my medical decision making (see chart for details).  CRITICAL CARE Performed by: Julie Haviland   Total critical care time: 75 minutes  Critical care time was exclusive of separately billable procedures and treating other patients.  Critical care was necessary to treat or prevent imminent or life-threatening deterioration.  Critical care was time spent personally by me on the following activities: development of treatment plan with patient and/or surrogate as well as nursing, discussions with consultants, evaluation of   patient's response to treatment, examination of patient, obtaining history from patient or surrogate,  ordering and performing treatments and interventions, ordering and review of laboratory studies, ordering and review of radiographic studies, pulse oximetry and re-evaluation of patient's condition.  Clinical Course as of Aug 10 1516  Wed Aug 09, 2018  2356 Family talked together on the phone in decided it would be appropriate if they stayed at [facility].  I contacted the hospitalist Dr. Maudie Mercury who will proceed with admitting the patient.   [MB]    Clinical Course User Index [MB] Hayden Rasmussen, MD  Pt has not required any sedation since intubation; however, she is moving her left leg a little bit.  BP treated with labetalol, but CT head bleed does not look like a typical hypertensive bleed.  The results of the CT head were d/w Dr. Rory Percy (neurology).  He does not think there is much hope for patient.  He recommended d/w NS.  Pt d/w Dr. Zada Finders (NS) who said there is nothing he can do for patient.  Pt d/w Dr. Maudie Mercury (triad) who will admit.  When he went to see the patient, the family said they wanted her at [facility].  Pt d/w Dr. Vaughan Browner (ICU) who was going to accept pt, but then called back questioning the necessity of transfer.  Family now agreeing to stay here.    Final Clinical Impressions(s) / ED Diagnoses   Final diagnoses:  Acute respiratory failure, unspecified whether with hypoxia or hypercapnia (HCC)  Diffuse large B-cell lymphoma, unspecified body region [facility])  Right-sided nontraumatic intracerebral hemorrhage, unspecified cerebral location [facility]  Uncal herniation ([place])  SDH (subdural hematoma) [facility])    ED Discharge Orders    None       Isla Pence, MD 07/31/2018 2113    Isla Pence, MD 08/12/2018 2223    Isla Pence, MD 08/10/18 1517

## 2018-08-10 ENCOUNTER — Encounter (HOSPITAL_COMMUNITY): Payer: Self-pay | Admitting: Internal Medicine

## 2018-08-10 ENCOUNTER — Other Ambulatory Visit: Payer: Self-pay

## 2018-08-10 DIAGNOSIS — C8333 Diffuse large B-cell lymphoma, intra-abdominal lymph nodes: Secondary | ICD-10-CM

## 2018-08-10 DIAGNOSIS — S065X9A Traumatic subdural hemorrhage with loss of consciousness of unspecified duration, initial encounter: Secondary | ICD-10-CM

## 2018-08-10 DIAGNOSIS — S065XAA Traumatic subdural hemorrhage with loss of consciousness status unknown, initial encounter: Secondary | ICD-10-CM

## 2018-08-10 DIAGNOSIS — J9600 Acute respiratory failure, unspecified whether with hypoxia or hypercapnia: Secondary | ICD-10-CM

## 2018-08-10 DIAGNOSIS — I619 Nontraumatic intracerebral hemorrhage, unspecified: Secondary | ICD-10-CM

## 2018-08-10 DIAGNOSIS — Z7189 Other specified counseling: Secondary | ICD-10-CM

## 2018-08-10 DIAGNOSIS — G935 Compression of brain: Secondary | ICD-10-CM

## 2018-08-10 LAB — COMPREHENSIVE METABOLIC PANEL WITH GFR
ALT: 33 U/L (ref 0–44)
AST: 46 U/L — ABNORMAL HIGH (ref 15–41)
Albumin: 3 g/dL — ABNORMAL LOW (ref 3.5–5.0)
Alkaline Phosphatase: 71 U/L (ref 38–126)
Anion gap: 10 (ref 5–15)
BUN: 23 mg/dL (ref 8–23)
CO2: 23 mmol/L (ref 22–32)
Calcium: 9.2 mg/dL (ref 8.9–10.3)
Chloride: 113 mmol/L — ABNORMAL HIGH (ref 98–111)
Creatinine, Ser: 1.09 mg/dL — ABNORMAL HIGH (ref 0.44–1.00)
GFR calc Af Amer: 54 mL/min — ABNORMAL LOW (ref >60)
GFR calc non Af Amer: 46 mL/min — ABNORMAL LOW (ref >60)
Glucose, Bld: 187 mg/dL — ABNORMAL HIGH (ref 70–99)
Potassium: 3.5 mmol/L (ref 3.5–5.1)
Sodium: 146 mmol/L — ABNORMAL HIGH (ref 135–145)
Total Bilirubin: 0.8 mg/dL (ref 0.3–1.2)
Total Protein: 6 g/dL — ABNORMAL LOW (ref 6.5–8.1)

## 2018-08-10 LAB — CBC
HCT: 32.9 % — ABNORMAL LOW (ref 36.0–46.0)
Hemoglobin: 10.4 g/dL — ABNORMAL LOW (ref 12.0–15.0)
MCH: 31.9 pg (ref 26.0–34.0)
MCHC: 31.6 g/dL (ref 30.0–36.0)
MCV: 100.9 fL — ABNORMAL HIGH (ref 80.0–100.0)
Platelets: 168 K/uL (ref 150–400)
RBC: 3.26 MIL/uL — ABNORMAL LOW (ref 3.87–5.11)
RDW: 13.6 % (ref 11.5–15.5)
WBC: 4.4 K/uL (ref 4.0–10.5)
nRBC: 0 % (ref 0.0–0.2)

## 2018-08-10 LAB — BLOOD GAS, ARTERIAL
Acid-Base Excess: 0.9 mmol/L (ref 0.0–2.0)
Bicarbonate: 25.9 mmol/L (ref 20.0–28.0)
Drawn by: 38235
FIO2: 50
MECHVT: 510 mL
O2 Saturation: 100 %
PEEP: 5 cmH2O
Patient temperature: 37
RATE: 18 {breaths}/min
pCO2 arterial: 28.5 mmHg — ABNORMAL LOW (ref 32.0–48.0)
pH, Arterial: 7.527 — ABNORMAL HIGH (ref 7.350–7.450)
pO2, Arterial: 261 mmHg — ABNORMAL HIGH (ref 83.0–108.0)

## 2018-08-10 LAB — MRSA PCR SCREENING: MRSA BY PCR: NEGATIVE

## 2018-08-10 MED ORDER — INSULIN ASPART 100 UNIT/ML ~~LOC~~ SOLN
0.0000 [IU] | SUBCUTANEOUS | Status: DC
  Administered 2018-08-10: 2 [IU] via SUBCUTANEOUS
  Administered 2018-08-10 – 2018-08-11 (×4): 1 [IU] via SUBCUTANEOUS

## 2018-08-10 MED ORDER — CHLORHEXIDINE GLUCONATE 0.12% ORAL RINSE (MEDLINE KIT)
15.0000 mL | Freq: Two times a day (BID) | OROMUCOSAL | Status: DC
  Administered 2018-08-10 – 2018-08-11 (×3): 15 mL via OROMUCOSAL

## 2018-08-10 MED ORDER — DEXTROSE-NACL 5-0.9 % IV SOLN
INTRAVENOUS | Status: DC
  Administered 2018-08-10 (×2): via INTRAVENOUS

## 2018-08-10 MED ORDER — LACTATED RINGERS IV SOLN
INTRAVENOUS | Status: DC
  Administered 2018-08-10 – 2018-08-11 (×6): via INTRAVENOUS

## 2018-08-10 MED ORDER — ORAL CARE MOUTH RINSE
15.0000 mL | OROMUCOSAL | Status: DC
  Administered 2018-08-10 – 2018-08-11 (×13): 15 mL via OROMUCOSAL

## 2018-08-10 NOTE — H&P (Signed)
TRH H&P   Patient Demographics:    [name], is a 84 y.o. female  MRN: [id number]   DOB - 1934/07/31  Admit Date - 08/04/2018  Outpatient Primary MD for the patient is Fayrene Helper, MD  Referring MD/NP/PA:  Isla Pence  Outpatient Specialists:   Patient coming from: home  Chief Complaint  Patient presents with  . UNRESPONSIVE      HPI:    [name]  is a 84 y.o. female, w hypertension, dm2, pad, lymphoma apparently presents after having pushed her life alert button. EMS found her unresponsive at home.  Pupils unequal.  Pt was intubated in ER on arrival.    In Ed,  T afebrile P 40 Bp 116/56 as high as 182/139 Pox 100% on ventilator  CT brain IMPRESSION: 1. Severe intracranial hemorrhage and mass effect including right uncal herniation, subtotal effacement of basilar cisterns, and leftward midline shift of 23 mm. Trapped and dilated left lateral ventricle.  2. Relatively large (91 mL) intra-axial hemorrhage epicenter in the superior right hemisphere which I suspect may have secondarily extended into the right subdural space and ventricular system. Relatively large (18 mm thick) mixed density right subdural Hematoma.  CTA chest IMPRESSION: 1.  Negative for acute pulmonary embolus. 2. Endotracheal tube and enteric tube appear well placed. 3. Lower lung volumes with mild atelectasis. 4. No other acute findings in the chest or visible upper abdomen.  Urinalysis negative  Wbc 3.7, hgb 10.7, Plt 193  Na 144, k 3.9, Bun 25, Creatinine 1.13  Ast 39, Alt 32  Ammonia 15  Ph 7.451 PCo2 36.3  PO2 448  INR 0.96  ED consulted neurology and neurosurgery ([place]) and was told not a surgical candidate.   Family present with patient wish her to be full code until the rest of her family arrive in AM.    Pt will be admitted for ICH.      Review of systems:    In addition to the HPI above,  Unable to obtain due to unresponsive and intubated   With Past History of the following :    Past Medical History:  Diagnosis Date  . Arthritis of knee    bilateral    . Chronic renal insufficiency   . Diabetes mellitus, type 2 ([place])   . GERD (gastroesophageal reflux disease)   . History of kidney stones   . Hypertension   . Iron deficiency anemia 12/28/2016  . Lymphadenopathy, abdominal 12/28/2016  . Osteopenia   . PAD (peripheral artery disease) ([place])   . Personal history of colonic polyps 3 years ago    colonoscopy       Past Surgical History:  Procedure Laterality Date  . BIOPSY  12/16/2016   Procedure: BIOPSY;  Surgeon: Rogene Houston, MD;  [facility];  Service: Endoscopy;;  gastric polyp  . bunionectomy bilateral  feet    . COLONOSCOPY N/A 12/16/2016   Procedure: COLONOSCOPY;  Surgeon: Rogene Houston, MD;  [facility];  Service: Endoscopy;  Laterality: N/A;  2:15  . DENTAL SURGERY    . ESOPHAGOGASTRODUODENOSCOPY N/A 12/16/2016   Procedure: ESOPHAGOGASTRODUODENOSCOPY (EGD);  Surgeon: Rogene Houston, MD;  [facility];  Service: Endoscopy;  Laterality: N/A;  . gum disease  2010   treated by Dr. Andree Elk   . LAPAROTOMY N/A 01/31/2017   Procedure: EXPLORATORY LAPAROTOMY;  Surgeon: Aviva Signs, MD;  Location: [facility];  Service: General;  Laterality: N/A;  . Left knee manipulation  under anasthesia due to contracture -  03/08/08   Dr. Aline Brochure   . left knee replacement  08/14/08   Dr. Aline Brochure   . LYMPH NODE BIOPSY  01/31/2017   Procedure: LYMPH NODE BIOPSY;  Surgeon: Aviva Signs, MD;  Location: [facility];  Service: General;;  . PORTACATH PLACEMENT Left 02/21/2017   Procedure: INSERTION PORT-A-CATH;  Surgeon: Aviva Signs, MD;  Location: [facility];  Service: General;  Laterality: Left;  . TOTAL ABDOMINAL HYSTERECTOMY     bleeding   . TOTAL KNEE ARTHROPLASTY Right 05/12/2016   Procedure: TOTAL  KNEE ARTHROPLASTY;  Surgeon: [name], MD;  Location: [facility];  Service: Orthopedics;  Laterality: Right;  . WISDOM TOOTH EXTRACTION        Social History:     Social History   Tobacco Use  . Smoking status: Former Smoker    Packs/day: 0.25    Years: 30.00    Pack years: 7.50    Types: Cigarettes    Last attempt to quit: 10/19/1999    Years since quitting: 18.8  . Smokeless tobacco: Never Used  Substance Use Topics  . Alcohol use: No     Lives - at home  Mobility - walks by self   Family History :     Family History  Problem Relation Age of Onset  . Colon cancer Father   . Cancer Mother   . Stroke Mother   . Hypertension Brother   . Dementia Sister   . Multiple sclerosis Daughter   . Pseudotumor cerebri Daughter   . Neuropathy Daughter        Home Medications:   Prior to Admission medications   Medication Sig Start Date End Date Taking? Authorizing Provider  acetaminophen (TYLENOL) 325 MG tablet Take 325 mg by mouth daily.    Yes [provider]  aspirin EC 81 MG tablet Take 81 mg by mouth daily.   Yes [provider]  CALCIUM PO Take 1 tablet by mouth daily.   Yes [provider]  diphenhydrAMINE (BENADRYL) 25 MG tablet Take 25 mg by mouth daily as needed for allergies.   Yes [provider]  Pediatric Multivitamins-Iron (FLINTSTONES PLUS IRON) chewable tablet Chew 1 tablet by mouth 2 (two) times daily. 12/16/16  Yes Rehman, Mechele Dawley, MD  polyethylene glycol (MIRALAX / GLYCOLAX) packet Take 17 g by mouth daily as needed for mild constipation.   Yes [provider]  UNABLE TO FIND Pullups and chuks for use daily as needed 12/21/17   Fayrene Helper, MD  allopurinol (ZYLOPRIM) 300 MG tablet Take 1 tablet (300 mg total) by mouth daily. 02/10/17 09/22/17  Holley Bouche, NP  prochlorperazine (COMPAZINE) 10 MG tablet Take 1 tablet (10 mg total) by mouth every 6 (six) hours as needed (Nausea or vomiting). Patient not  taking: Reported on 09/22/2017 02/10/17 09/22/17  Holley Bouche, NP     Allergies:     Allergies  Allergen Reactions  . Codeine Hives  . Fosamax [Alendronate Sodium] Other (See Comments)    Pt reports excessive urination on the day she took the pill     Physical Exam:   Vitals  Blood pressure (!) 116/56, pulse (!) 39, resp. rate 18, height 5\' 8"  (1.727 m), weight 77.9 kg, SpO2 100 %.    2. Unresponsive  3. Moves her extremities to pain Reflexes 2++ symmetric, with upgoing toe on the left, equivocal on the right   4. Pupils 36mm left, 1mm right  , direct , consensual near intact  5. Supple Neck, No JVD, No cervical lymphadenopathy appriciated, No Carotid Bruits.  6. Symmetrical Chest wall movement, Good air movement bilaterally, CTAB.  7. RRR, No Gallops, Rubs or Murmurs, No Parasternal Heave.  8. Positive Bowel Sounds, Abdomen Soft, No tenderness, No organomegaly appriciated,No rebound -guarding or rigidity.  9.  No Cyanosis, Normal Skin Turgor, No Skin Rash or Bruise.  10. Good muscle tone,  joints appear normal , no effusions, Normal ROM.  11. No Palpable Lymph Nodes in Neck or Axillae    Foley catheter in place    Data Review:    CBC Recent Labs  Lab 08/01/2018 1755 07/25/2018 1800  WBC 3.7*  --   HGB 10.7* 10.5*  HCT 34.0* 31.0*  PLT 193  --   MCV 104.9*  --   MCH 33.0  --   MCHC 31.5  --   RDW 13.8  --   LYMPHSABS 0.8  --   MONOABS 0.5  --   EOSABS 0.1  --   BASOSABS 0.0  --    ------------------------------------------------------------------------------------------------------------------  Chemistries  Recent Labs  Lab 07/19/2018 1755 07/26/2018 1800  NA 144 143  K 3.9 3.9  CL 110 107  CO2 28  --   GLUCOSE 132* 125*  BUN 25* 27*  CREATININE 1.13* 1.20*  CALCIUM 9.3  --   AST 39  --   ALT 32  --   ALKPHOS 84  --   BILITOT 0.7  --     ------------------------------------------------------------------------------------------------------------------ estimated creatinine clearance is 40.3 mL/min (A) (by C-G formula based on SCr of 1.2 mg/dL (H)). ------------------------------------------------------------------------------------------------------------------ No results for input(s): TSH, T4TOTAL, T3FREE, THYROIDAB in the last 72 hours.  Invalid input(s): FREET3  Coagulation profile Recent Labs  Lab 08/10/2018 1755  INR 0.96   ------------------------------------------------------------------------------------------------------------------- No results for input(s): DDIMER in the last 72 hours. -------------------------------------------------------------------------------------------------------------------  Cardiac Enzymes No results for input(s): CKMB, TROPONINI, MYOGLOBIN in the last 168 hours.  Invalid input(s): CK ------------------------------------------------------------------------------------------------------------------ No results found for: BNP   ---------------------------------------------------------------------------------------------------------------  Urinalysis    Component Value Date/Time   COLORURINE YELLOW 08/03/2018 1754   APPEARANCEUR HAZY (A) 08/17/2018 1754   LABSPEC 1.017 07/27/2018 1754   PHURINE 5.0 08/17/2018 1754   GLUCOSEU NEGATIVE 08/14/2018 1754   HGBUR SMALL (A) 08/17/2018 1754   BILIRUBINUR NEGATIVE 08/15/2018 1754   BILIRUBINUR negative 03/18/2016 1213   KETONESUR NEGATIVE 08/03/2018 1754   PROTEINUR 30 (A) 08/10/2018 1754   UROBILINOGEN 0.2 03/18/2016 1213   UROBILINOGEN 0.2 09/23/2007 0417   NITRITE NEGATIVE 08/16/2018 1754   LEUKOCYTESUR NEGATIVE 07/31/2018 1754    ----------------------------------------------------------------------------------------------------------------   Imaging Results:    Ct Head Wo Contrast  Result Date: 07/19/2018 CLINICAL  DATA:  84 year old female found by EMS unresponsive. Personal history of large B-cell lymphoma. EXAM: CT HEAD WITHOUT CONTRAST TECHNIQUE: Contiguous axial images  were obtained from the base of the skull through the vertex without intravenous contrast. COMPARISON:  PET-CT 09/19/2017. FINDINGS: Brain: Large superior right hemisphere intra-axial hemorrhage with surrounding edema. The hyperdense intra-axial blood products encompass 58 x 59 x 53 millimeters for an estimated intra-axial blood volume of 91 milliliters. There is extension across midline via the body of the corpus callosum and extension into the ventricular system. Furthermore, there is a wide ranging mixed density right side subdural hematoma measuring up to 18 millimeters in thickness which might also have extended from the intra-axial hemorrhage. There is a small volume of parafalcine subdural blood. There is a small to moderate volume of subdural blood layering along the clivus. Subsequent severe intracranial mass effect with leftward midline shift up to 23 millimeters. There is right uncal herniation. There is combined trapping and effacement of the ventricular system with a small to moderate volume of intraventricular hemorrhage. There is downward mass effect on the brain with subtotal effacement of the basilar cisterns. No tonsillar herniation at this time. No superimposed acute cortically based infarct is evident. Vascular: Calcified atherosclerosis at the skull base. Skull: Hyperostosis. No skull fracture or suspicious osseous lesion identified. Sinuses/Orbits: Visualized paranasal sinuses and mastoids are stable and well pneumatized. Other: Orbit and scalp soft tissues appear negative. Fluid in the visible pharynx. IMPRESSION: 1. Severe intracranial hemorrhage and mass effect including right uncal herniation, subtotal effacement of basilar cisterns, and leftward midline shift of 23 mm. Trapped and dilated left lateral ventricle. 2. Relatively large  (91 mL) intra-axial hemorrhage epicenter in the superior right hemisphere which I suspect may have secondarily extended into the right subdural space and ventricular system. Relatively large (18 mm thick) mixed density right subdural hematoma. 3. Critical Value/emergent results were called by telephone at the time of interpretation on 08/06/2018 at 8:07 pm to Dr. Isla Pence , who verbally acknowledged these results. Electronically Signed   By: Genevie Ann M.D.   On: 08/15/2018 20:11   Ct Angio Chest Pe W And/or Wo Contrast  Result Date: 08/03/2018 CLINICAL DATA:  84 year old female found by EMS unresponsive. Severe acute intracranial hemorrhage. Personal history of large B-cell lymphoma. EXAM: CT ANGIOGRAPHY CHEST WITH CONTRAST TECHNIQUE: Multidetector CT imaging of the chest was performed using the standard protocol during bolus administration of intravenous contrast. Multiplanar CT image reconstructions and MIPs were obtained to evaluate the vascular anatomy. CONTRAST:  136mL ISOVUE-370 IOPAMIDOL (ISOVUE-370) INJECTION 76% COMPARISON:  Restaging CT chest, abdomen and Pelvis 06/07/2018 FINDINGS: Cardiovascular: Good contrast bolus timing in the pulmonary arterial tree. Mild respiratory motion. No focal filling defect identified in the pulmonary arteries to suggest acute pulmonary embolism. Suboptimal evaluation of the distal lower lobe branches due to motion. Stable cardiomegaly and tortuosity of the thoracic aorta. No aortic contrast on this exam. Calcified aortic atherosclerosis. Stable left chest Port-A-Cath. Enteric tube in place courses into the stomach. Mediastinum/Nodes: No mediastinal lymphadenopathy. Lungs/Pleura: Intubated. Endotracheal tube tip terminates above the carina. Lower lung volumes. Left greater than right lower lobe and lingula atelectasis. The major airways are patent. No pleural effusion or consolidation. No pulmonary nodule identified. Upper Abdomen: Enteric tube visible to the body of  the stomach. Negative visible liver, spleen. Other visible upper abdominal viscera appear stable including small left renal upper pole cyst. Musculoskeletal: Degenerative changes throughout the visible spine. No acute osseous abnormality identified. Review of the MIP images confirms the above findings. IMPRESSION: 1.  Negative for acute pulmonary embolus. 2. Endotracheal tube and enteric tube appear well placed.  3. Lower lung volumes with mild atelectasis. 4. No other acute findings in the chest or visible upper abdomen. Electronically Signed   By: Genevie Ann M.D.   On: 07/28/2018 20:16   Dg Chest Portable 1 View  Result Date: 08/17/2018 CLINICAL DATA:  Intubated EXAM: PORTABLE CHEST 1 VIEW COMPARISON:  CT chest 06/07/2018, radiograph 03/18/2016, PET-CT 09/19/2017 FINDINGS: Endotracheal tube tip is about 16 mm superior to carina. Esophageal tube tip below the diaphragm but non included. Left-sided central venous port tip over the SVC. Mild cardiomegaly with suspected small left pleural effusion. Atelectasis or minimal infiltrate at the left base. Aortic atherosclerosis. No pneumothorax. IMPRESSION: 1. Endotracheal tube tip about 16 mm superior to the carina. Esophageal tube tip below the diaphragm but non included 2. Borderline to mild cardiomegaly. Suspected trace left pleural effusion with streaky atelectasis or minimal infiltrate at the left base. Electronically Signed   By: Donavan Foil M.D.   On: 07/22/2018 18:30       Assessment & Plan:    Principal Problem:   Intracranial hemorrhage (HCC) Active Problems:   Type 2 diabetes, diet controlled (HCC)   Essential hypertension   Diffuse large B cell lymphoma (HCC)    ICH STOP aspirin Not a surgical candidate per ER after speaking with neurosurgery Intubated Family wishes other family members to arrive before changing CODE STATUS They are supposed to arrive in the early morning PCCM consulted for management of mechanical  ventilation  Bradycardia Tele Secondary to cushings reflex  Hypertension  Dm2 fsbs q4h, ISS  Anemia Check cbc in am  DVT Prophylaxis  SCDs   AM Labs Ordered, also please review Full Orders  Family Communication: Admission, patients condition and plan of care including tests being ordered have been discussed with the patient and family  who indicate understanding and agree with the plan and Code Status.  Code Status  FULL CODE, family willing to change code status after other family members arrive in AM  Condition  Critical  Consults called: PCCM  Admission status: inpatient  Time spent in minutes : 60   Jani Gravel M.D on 08/10/2018 at 12:04 AM  Between 7am to 7pm - Pager - [phone]  After 7pm go to [email or web address] - password [facility]  [facility] - Office  [phone]

## 2018-08-10 NOTE — Progress Notes (Signed)
Pts rectal temp 95.2- Dr. Maudie Mercury paged and made aware. Waiting for orders/call back.

## 2018-08-10 NOTE — Progress Notes (Addendum)
Patient admitted by Dr. Maudie Mercury earlier this morning.  Patient seen and examined.  Currently on ventilator.  She is unresponsive and is not on any sedation.  She is moving her left side at times, but left pupil is not reactive.  This is an unfortunate 84 year old female with a history of lymphoma, diabetes who was found unresponsive at home.  On arrival to the emergency room, CT scan of the head showed severe intracranial hemorrhage and mass-effect including right uncal herniation, subtotal effacement of basal cisterns, and leftward midline shift of 23 mm.  There was trapped and dilated left lateral ventricle.  There is also comment on right subdural hematoma which is 18 mm thick.  Case was reviewed by ED physician with neurology at [facility] as well as neurosurgery.  No further intervention was recommended.  Recommendations were for comfort care.  Patient was admitted to the ICU and continued on mechanical ventilation.  She is continued on intravenous clevidipine for blood pressure control.  She is also on IV fluids to help manage increased urine output, likely related to developing diabetes insipidus.   Family has arrived from out of town to discuss goals of care.  I discussed her care with her son and her daughter at the bedside.  Palliative care NP, Mariana Kaufman was also present for conversation.  I explained the severe/extensive findings on CT scan, and her overall poor prognosis.  Recommendations were to perform compassionate extubation, focus on comfort care and dignity.  Since patient's deterioration was quite sudden, her family is having a difficult time coming to terms with this.  They requested 24 hours to observe the patient, process all the information and discuss goals amongst themselves.  Questions were answered and I have encouraged them to reach out if they have further questions.  We will need to reconvene tomorrow with the family.  We will continue current care for now.  I have also recommended  DNR status which they are also contemplating.  Full code for now.  Critical care: 30 minutes  [employer]

## 2018-08-10 NOTE — Consult Note (Signed)
Consultation Note Date: 08/10/2018   Patient Name: [name]  DOB: [birth year]  MRN: [id number]  Age / Sex: 84 y.o., female  PCP: Fayrene Helper, MD Referring Physician: Kathie Dike, MD  Reason for Consultation: Establishing goals of care  HPI/Patient Profile: 84 y.o. female  with past medical history of DM2, PAD, lymphoma, admitted on 08/14/2018 with severe intracranial hemorrhage, intubated for airway protection. CT scan shows severe intracranial hemorrhage and mass effect including R uncal herniation, subtotal effacement of basilar cicsterns, and left midline shift of 23 cm, trapped and dilated left lateral ventricle. Per neuro consults patient not candidate for surgical intervention. She has developed diabetes insipidus and is not maintaining her temperature. Palliative medicine consulted to assist with [place].    Clinical Assessment and Goals of Care:  I have reviewed medical records including EPIC notes, labs and imaging, received report from Dr. Roderic Palau, assessed the patient and then met at the bedside along with Dr. Roderic Palau, patient's son and daughter-in-law  to discuss diagnosis prognosis, GOC, EOL wishes, disposition and options.  I introduced Palliative Medicine as specialized medical care for people living with serious illness. It focuses on providing relief from the symptoms and stress of a serious illness. The goal is to improve quality of life for both the patient and the family.  We discussed a brief life review of the patient. She lives in [place], has three children. Enjoys gardening, loves flowers.  As far as functional and nutritional status prior to admission was living independently. She had completed treatment for her lymphoma this year and was doing quite well.    We discussed their current illness and what it means in the larger context of their on-going co-morbidities.   Natural disease trajectory and expectations at EOL were discussed. Dr. Roderic Palau discussed with family that her hemorrhage is extensive and is likely not survivable.   I attempted to elicit values and goals of care important to the patient.    I reviewed with patient's family the difference between continued aggressive medical care and a transition to comfort path. Answered their questions related to continued life sustaining treatment vs withdrawal of life sustaining treatment and allowing for natural death and supporting symptoms of natural death.   Code status was discussed. DNR status was recommended by Dr. Roderic Palau and myself in light of patient's likely terminal status.   Questions and concerns were addressed.  Hard Choices booklet left for review. The family was encouraged to call with questions or concerns.    Primary Decision Maker NEXT OF KIN- patient's children    SUMMARY OF RECOMMENDATIONS -Patient's son is going to speak with siblings to discuss code status and transition to comfort care and compassionate extubation -Family requesting 24 hours to process information and discuss -PMT is not on service tomorrow, encouraged family to continue to discuss [place] with patient's primary team    Code Status/Advance Care Planning:  Full code   Additional Recommendations (Limitations, Scope, Preferences):  Full Scope Treatment  Prognosis:    Unable to  determine- poor prognosis- if transitioned to full comfort measures would anticipate hours to days  Discharge Planning: To Be Determined  Primary Diagnoses: Present on Admission: . Essential hypertension . Diffuse large B cell lymphoma ([place])   I have reviewed the medical record, interviewed the patient and family, and examined the patient. The following aspects are pertinent.  Past Medical History:  Diagnosis Date  . Arthritis of knee    bilateral    . Chronic renal insufficiency   . Diabetes mellitus, type 2 ([place])   . GERD  (gastroesophageal reflux disease)   . History of kidney stones   . Hypertension   . Iron deficiency anemia 12/28/2016  . Lymphadenopathy, abdominal 12/28/2016  . Osteopenia   . PAD (peripheral artery disease) ([place])   . Personal history of colonic polyps 3 years ago    colonoscopy    Social History   Socioeconomic History  . Marital status: Widowed    Spouse name: Not on file  . Number of children: 3  . Years of education: college   . Highest education level: Not on file  Occupational History  . Occupation: retired Therapist, music: RETIRED  Social Needs  . Financial resource strain: Not hard at all  . Food insecurity:    Worry: Never true    Inability: Never true  . Transportation needs:    Medical: No    Non-medical: No  Tobacco Use  . Smoking status: Former Smoker    Packs/day: 0.25    Years: 30.00    Pack years: 7.50    Types: Cigarettes    Last attempt to quit: 10/19/1999    Years since quitting: 18.8  . Smokeless tobacco: Never Used  Substance and Sexual Activity  . Alcohol use: No  . Drug use: No  . Sexual activity: Not Currently    Comment: widowed  Lifestyle  . Physical activity:    Days per week: 0 days    Minutes per session: 0 min  . Stress: Only a little  Relationships  . Social connections:    Talks on phone: More than three times a week    Gets together: Twice a week    Attends religious service: Never    Active member of club or organization: No    Attends meetings of clubs or organizations: Never    Relationship status: Widowed  Other Topics Concern  . Not on file  Social History Narrative  . Not on file   Family History  Problem Relation Age of Onset  . Colon cancer Father   . Cancer Mother   . Stroke Mother   . Hypertension Brother   . Dementia Sister   . Multiple sclerosis Daughter   . Pseudotumor cerebri Daughter   . Neuropathy Daughter    Scheduled Meds: . chlorhexidine gluconate (MEDLINE KIT)  15 mL Mouth Rinse BID   . insulin aspart  0-9 Units Subcutaneous Q4H  . mouth rinse  15 mL Mouth Rinse 10 times per day   Continuous Infusions: . clevidipine Stopped (08/10/18 0209)  . dextrose 5 % and 0.9% NaCl 50 mL/hr at 08/10/18 0243  . lactated ringers 200 mL/hr at 08/10/18 0958   PRN Meds:. Medications Prior to Admission:  Prior to Admission medications   Medication Sig Start Date End Date Taking? Authorizing Provider  acetaminophen (TYLENOL) 325 MG tablet Take 325 mg by mouth daily.    Yes [provider]  aspirin EC 81 MG tablet Take  81 mg by mouth daily.   Yes [provider]  CALCIUM PO Take 1 tablet by mouth daily.   Yes [provider]  diphenhydrAMINE (BENADRYL) 25 MG tablet Take 25 mg by mouth daily as needed for allergies.   Yes [provider]  Pediatric Multivitamins-Iron (FLINTSTONES PLUS IRON) chewable tablet Chew 1 tablet by mouth 2 (two) times daily. 12/16/16  Yes Rehman, Mechele Dawley, MD  polyethylene glycol (MIRALAX / GLYCOLAX) packet Take 17 g by mouth daily as needed for mild constipation.   Yes [provider]  UNABLE TO FIND Pullups and chuks for use daily as needed 12/21/17   Fayrene Helper, MD  allopurinol (ZYLOPRIM) 300 MG tablet Take 1 tablet (300 mg total) by mouth daily. 02/10/17 09/22/17  Holley Bouche, NP  prochlorperazine (COMPAZINE) 10 MG tablet Take 1 tablet (10 mg total) by mouth every 6 (six) hours as needed (Nausea or vomiting). Patient not taking: Reported on 09/22/2017 02/10/17 09/22/17  Holley Bouche, NP   Allergies  Allergen Reactions  . Codeine Hives  . Fosamax [Alendronate Sodium] Other (See Comments)    Pt reports excessive urination on the day she took the pill   Review of Systems  Unable to perform ROS   Physical Exam  Constitutional: She appears well-developed and well-nourished.  Cardiovascular:  bradycardic  Pulmonary/Chest:  Intubated on full support  Neurological:  nonresponsive  Skin: Skin is  warm and dry.  Nursing note and vitals reviewed.   Vital Signs: BP (!) 141/72   Pulse (!) 49   Temp (!) 96.3 F (35.7 C) (Rectal)   Resp 18   Ht 5' 8" (1.727 m)   Wt 78.4 kg   SpO2 100%   BMI 26.28 kg/m  Pain Scale: CPOT       SpO2: SpO2: 100 % O2 Device:SpO2: 100 % O2 Flow Rate: .   IO: Intake/output summary:   Intake/Output Summary (Last 24 hours) at 08/10/2018 1255 Last data filed at 08/10/2018 0500 Gross per 24 hour  Intake 116.13 ml  Output 1400 ml  Net -1283.87 ml    LBM:   Baseline Weight: Weight: 77.9 kg Most recent weight: Weight: 78.4 kg     Palliative Assessment/Data: PPS: 10%     Thank you for this consult. Palliative medicine will continue to follow and assist as needed.   Time In: 1210 Time Out: 1320 Time Total: 70 minutes Greater than 50%  of this time was spent counseling and coordinating care related to the above assessment and plan.  Signed by: Mariana Kaufman, AGNP-C Palliative Medicine    Please contact Palliative Medicine Team phone at [phone] for questions and concerns.  For individual provider: See Shea Evans

## 2018-08-10 NOTE — Progress Notes (Signed)
eLink Physician-Brief Progress Note Patient Name: [name] DOB: 1933/11/26 MRN: [id number]   Date of Service  08/10/2018  HPI/Events of Note  Patient urine output averaging 200/hour.  Likely in DI  eICU Interventions  Start LR at 200 cc/hr and continue D%NS at 50 cc/hr.  Monitor sodium level.     Intervention Category Intermediate Interventions: Hypovolemia - evaluation and management  Judd Lien 08/10/2018, 4:26 AM

## 2018-08-10 NOTE — Progress Notes (Signed)
New order for warming blanket- [facility] Joe paged and made aware a warming blanket was needed. Waiting for supplies.

## 2018-08-10 NOTE — Progress Notes (Signed)
When pt was admitted Dr. Ezequiel Ganser this RN to make her aware about urine output- d/t pt being at risk for diabetes insipidus. At 0130 760mL was emptied from pts catheter, and @ 0410 658mL was emptied. Dr. Genevive Bi stated she was going to start pt on LR to match her urine output. Will continue to monitor pt

## 2018-08-10 NOTE — Progress Notes (Signed)
AC Joe stated he could not find the warming blanket or probe and would pass on to day shift AC. Will let day shift nurse know.

## 2018-08-10 NOTE — Progress Notes (Signed)
eLink Physician-Brief Progress Note Patient Name: [name] DOB: 08/28/1934 MRN: [id number]   Date of Service  08/10/2018  HPI/Events of Note  Patient found unresponsive at home, CT with severe intracranial hemorrhage and right uncal herniation. No intervention as per neurology and neurosurgery.  eICU Interventions  Continue current vent support/settings, maintain normotension and euglycemia.     Intervention Category Major Interventions: Hemorrhage - evaluation and management;Intracranial hypertension - evaluation and management;Hypertension - evaluation and management Evaluation Type: New Patient Evaluation  Judd Lien 08/10/2018, 1:33 AM

## 2018-08-11 ENCOUNTER — Telehealth: Payer: Self-pay | Admitting: Family Medicine

## 2018-08-11 DIAGNOSIS — I629 Nontraumatic intracranial hemorrhage, unspecified: Secondary | ICD-10-CM

## 2018-08-11 LAB — BASIC METABOLIC PANEL
ANION GAP: 6 (ref 5–15)
BUN: 16 mg/dL (ref 8–23)
CALCIUM: 8.7 mg/dL — AB (ref 8.9–10.3)
CO2: 25 mmol/L (ref 22–32)
CREATININE: 1.05 mg/dL — AB (ref 0.44–1.00)
Chloride: 124 mmol/L — ABNORMAL HIGH (ref 98–111)
GFR, EST AFRICAN AMERICAN: 56 mL/min — AB (ref >60)
GFR, EST NON AFRICAN AMERICAN: 48 mL/min — AB (ref >60)
Glucose, Bld: 128 mg/dL — ABNORMAL HIGH (ref 70–99)
Potassium: 3.7 mmol/L (ref 3.5–5.1)
SODIUM: 155 mmol/L — AB (ref 135–145)

## 2018-08-11 LAB — CBC
HEMATOCRIT: 32 % — AB (ref 36.0–46.0)
Hemoglobin: 9.9 g/dL — ABNORMAL LOW (ref 12.0–15.0)
MCH: 32.5 pg (ref 26.0–34.0)
MCHC: 30.9 g/dL (ref 30.0–36.0)
MCV: 104.9 fL — ABNORMAL HIGH (ref 80.0–100.0)
NRBC: 0 % (ref 0.0–0.2)
PLATELETS: 164 10*3/uL (ref 150–400)
RBC: 3.05 MIL/uL — ABNORMAL LOW (ref 3.87–5.11)
RDW: 14.3 % (ref 11.5–15.5)
WBC: 7.2 10*3/uL (ref 4.0–10.5)

## 2018-08-11 LAB — GLUCOSE, CAPILLARY
GLUCOSE-CAPILLARY: 111 mg/dL — AB (ref 70–99)
GLUCOSE-CAPILLARY: 129 mg/dL — AB (ref 70–99)
GLUCOSE-CAPILLARY: 137 mg/dL — AB (ref 70–99)
GLUCOSE-CAPILLARY: 149 mg/dL — AB (ref 70–99)
GLUCOSE-CAPILLARY: 96 mg/dL (ref 70–99)
Glucose-Capillary: 143 mg/dL — ABNORMAL HIGH (ref 70–99)
Glucose-Capillary: 166 mg/dL — ABNORMAL HIGH (ref 70–99)
Glucose-Capillary: 119 mg/dL — ABNORMAL HIGH (ref 70–99)
Glucose-Capillary: 111 mg/dL — ABNORMAL HIGH (ref 70–99)
Glucose-Capillary: 111 mg/dL — ABNORMAL HIGH (ref 70–99)

## 2018-08-11 MED ORDER — MORPHINE 100MG IN NS 100ML (1MG/ML) PREMIX INFUSION
1.0000 mg/h | INTRAVENOUS | Status: DC
  Administered 2018-08-11: 1 mg/h via INTRAVENOUS
  Filled 2018-08-11: qty 100

## 2018-08-11 MED ORDER — DESMOPRESSIN ACETATE SPRAY 0.01 % NA SOLN
10.0000 ug | Freq: Two times a day (BID) | NASAL | Status: DC
  Administered 2018-08-11: 10 ug via NASAL
  Filled 2018-08-11: qty 5

## 2018-08-11 MED ORDER — MORPHINE BOLUS VIA INFUSION
2.0000 mg | Freq: Once | INTRAVENOUS | Status: AC
  Administered 2018-08-11: 2 mg via INTRAVENOUS
  Filled 2018-08-11: qty 2

## 2018-08-11 MED ORDER — DEXTROSE 5 % IV SOLN
INTRAVENOUS | Status: DC
  Administered 2018-08-11: 12:00:00 via INTRAVENOUS

## 2018-08-14 ENCOUNTER — Ambulatory Visit: Payer: Medicare Other | Admitting: Family Medicine

## 2018-08-14 NOTE — Telephone Encounter (Signed)
Noted, unfortunately pateint is now deceased, please organize so we can send a card to her family , thanks

## 2018-08-18 NOTE — Procedures (Signed)
Extubation Procedure Note  Patient orally suctioned small amount of thick white clear secretions obtained.  ETT also suctioned no secretions obtained.  ETT and OG removed together, mouth suctioned again and patient placed on  3 L [place] for comfort. Patient Details:   Name: [name] DOB: 1933-12-11 MRN: [id number]   Airway Documentation:  Airway 7.5 mm (Active)  Secured at (cm) 23 cm 08/01/2018 12:00 AM   Vent end date  and time 07/12/18 at 1412  Evaluation  O2 sats: stable throughout Complications: No apparent complications Patient did tolerate procedure well. Bilateral Breath Sounds: Clear, Diminished   Patient appears  responsive to pain.  Pete Pelt 08-15-2018, 3:25 PM

## 2018-08-18 NOTE — Progress Notes (Signed)
Wasted 28ml Morphine into Stericycle with Toy Baker, RN.

## 2018-08-18 NOTE — Telephone Encounter (Signed)
I was calling to confirm Appts, and Daughter advised that PT is in ICU room 6 at [facility] with a Brain Bleed. I advised that I would send Dr Moshe Cipro a message to advise.

## 2018-08-18 NOTE — Discharge Summary (Signed)
Physician Discharge Summary  [name] WLN:[id number] DOB: [birth year] DOA: 08-12-18  PCP: Fayrene Helper, MD  Admit date: 08-12-18  Time of death: 08/14/2018 1629  Brief/Interim Summary: Per HPI from Dr. Roderic Palau on 10/24: This is an unfortunate 84 year old female with a history of lymphoma, diabetes who was found unresponsive at home.  On arrival to the emergency room, CT scan of the head showed severe intracranial hemorrhage and mass-effect including right uncal herniation, subtotal effacement of basal cisterns, and leftward midline shift of 23 mm.  There was trapped and dilated left lateral ventricle.  There is also comment on right subdural hematoma which is 18 mm thick.  Case was reviewed by ED physician with neurology at [facility] as well as neurosurgery.  No further intervention was recommended.  Recommendations were for comfort care.  Patient was admitted to the ICU and continued on mechanical ventilation.  She is continued on intravenous clevidipine for blood pressure control.  She is also on IV fluids to help manage increased urine output, likely related to developing diabetes insipidus.   Patient remained on mechanical ventilator for respiratory support and was noted to have worsening hypernatremia for which she was placed on desmopressin as well as D5 water IV fluid.  Family members arrived at bedside at approximately 1300 on 15-Aug-2023 where discussion was had with eldest daughter as well as son and other family members.  Discussion was had regarding poor prognosis given the fact that patient had significant herniations with no plans for neurological intervention as this would not be helpful.  This was discussed on the previous day with family members by palliative care as well.  After full understanding of the patient's condition and terminal prognosis, family members had agreed to make patient DNR and agreed to terminal extubation and comfort measures.  She was immediately given a  bolus of morphine and placed on morphine drip to help with respiratory distress and discomfort.  She then expired at 1629.  Discharge Diagnoses:  Principal Problem:   Intracranial hemorrhage ([place]) Active Problems:   Type 2 diabetes, diet controlled (HCC)   Essential hypertension   Diffuse large B cell lymphoma (HCC)   SDH (subdural hematoma) (HCC)   Uncal herniation (HCC)   Advanced care planning/counseling discussion   Goals of care, counseling/discussion  Principal diagnosis: Intracranial hemorrhage with cerebral herniation.  Discharge Instructions   Allergies as of 08-14-18      Reactions   Codeine Hives   Fosamax [alendronate Sodium] Other (See Comments)   Pt reports excessive urination on the day she took the pill      Medication List    ASK your doctor about these medications   acetaminophen 325 MG tablet Commonly known as:  TYLENOL Take 325 mg by mouth daily.   aspirin EC 81 MG tablet Take 81 mg by mouth daily.   CALCIUM PO Take 1 tablet by mouth daily.   diphenhydrAMINE 25 MG tablet Commonly known as:  BENADRYL Take 25 mg by mouth daily as needed for allergies.   FLINTSTONES PLUS IRON chewable tablet Chew 1 tablet by mouth 2 (two) times daily.   polyethylene glycol packet Commonly known as:  MIRALAX / GLYCOLAX Take 17 g by mouth daily as needed for mild constipation.   UNABLE TO FIND Pullups and chuks for use daily as needed       Allergies  Allergen Reactions  . Codeine Hives  . Fosamax [Alendronate Sodium] Other (See Comments)    Pt reports excessive urination  on the day she took the pill    Consultations:  None   Procedures/Studies: Ct Head Wo Contrast  Result Date: 07/29/2018 CLINICAL DATA:  84 year old female found by EMS unresponsive. Personal history of large B-cell lymphoma. EXAM: CT HEAD WITHOUT CONTRAST TECHNIQUE: Contiguous axial images were obtained from the base of the skull through the vertex without intravenous contrast.  COMPARISON:  PET-CT 09/19/2017. FINDINGS: Brain: Large superior right hemisphere intra-axial hemorrhage with surrounding edema. The hyperdense intra-axial blood products encompass 58 x 59 x 53 millimeters for an estimated intra-axial blood volume of 91 milliliters. There is extension across midline via the body of the corpus callosum and extension into the ventricular system. Furthermore, there is a wide ranging mixed density right side subdural hematoma measuring up to 18 millimeters in thickness which might also have extended from the intra-axial hemorrhage. There is a small volume of parafalcine subdural blood. There is a small to moderate volume of subdural blood layering along the clivus. Subsequent severe intracranial mass effect with leftward midline shift up to 23 millimeters. There is right uncal herniation. There is combined trapping and effacement of the ventricular system with a small to moderate volume of intraventricular hemorrhage. There is downward mass effect on the brain with subtotal effacement of the basilar cisterns. No tonsillar herniation at this time. No superimposed acute cortically based infarct is evident. Vascular: Calcified atherosclerosis at the skull base. Skull: Hyperostosis. No skull fracture or suspicious osseous lesion identified. Sinuses/Orbits: Visualized paranasal sinuses and mastoids are stable and well pneumatized. Other: Orbit and scalp soft tissues appear negative. Fluid in the visible pharynx. IMPRESSION: 1. Severe intracranial hemorrhage and mass effect including right uncal herniation, subtotal effacement of basilar cisterns, and leftward midline shift of 23 mm. Trapped and dilated left lateral ventricle. 2. Relatively large (91 mL) intra-axial hemorrhage epicenter in the superior right hemisphere which I suspect may have secondarily extended into the right subdural space and ventricular system. Relatively large (18 mm thick) mixed density right subdural hematoma. 3.  Critical Value/emergent results were called by telephone at the time of interpretation on 08/17/2018 at 8:07 pm to Dr. Isla Pence , who verbally acknowledged these results. Electronically Signed   By: Genevie Ann M.D.   On: 08/08/2018 20:11   Ct Angio Chest Pe W And/or Wo Contrast  Result Date: 07/19/2018 CLINICAL DATA:  84 year old female found by EMS unresponsive. Severe acute intracranial hemorrhage. Personal history of large B-cell lymphoma. EXAM: CT ANGIOGRAPHY CHEST WITH CONTRAST TECHNIQUE: Multidetector CT imaging of the chest was performed using the standard protocol during bolus administration of intravenous contrast. Multiplanar CT image reconstructions and MIPs were obtained to evaluate the vascular anatomy. CONTRAST:  121mL ISOVUE-370 IOPAMIDOL (ISOVUE-370) INJECTION 76% COMPARISON:  Restaging CT chest, abdomen and Pelvis 06/07/2018 FINDINGS: Cardiovascular: Good contrast bolus timing in the pulmonary arterial tree. Mild respiratory motion. No focal filling defect identified in the pulmonary arteries to suggest acute pulmonary embolism. Suboptimal evaluation of the distal lower lobe branches due to motion. Stable cardiomegaly and tortuosity of the thoracic aorta. No aortic contrast on this exam. Calcified aortic atherosclerosis. Stable left chest Port-A-Cath. Enteric tube in place courses into the stomach. Mediastinum/Nodes: No mediastinal lymphadenopathy. Lungs/Pleura: Intubated. Endotracheal tube tip terminates above the carina. Lower lung volumes. Left greater than right lower lobe and lingula atelectasis. The major airways are patent. No pleural effusion or consolidation. No pulmonary nodule identified. Upper Abdomen: Enteric tube visible to the body of the stomach. Negative visible liver, spleen. Other visible upper abdominal  viscera appear stable including small left renal upper pole cyst. Musculoskeletal: Degenerative changes throughout the visible spine. No acute osseous abnormality  identified. Review of the MIP images confirms the above findings. IMPRESSION: 1.  Negative for acute pulmonary embolus. 2. Endotracheal tube and enteric tube appear well placed. 3. Lower lung volumes with mild atelectasis. 4. No other acute findings in the chest or visible upper abdomen. Electronically Signed   By: Genevie Ann M.D.   On: 08/13/2018 20:16   Dg Chest Portable 1 View  Result Date: 08/08/2018 CLINICAL DATA:  Intubated EXAM: PORTABLE CHEST 1 VIEW COMPARISON:  CT chest 06/07/2018, radiograph 03/18/2016, PET-CT 09/19/2017 FINDINGS: Endotracheal tube tip is about 16 mm superior to carina. Esophageal tube tip below the diaphragm but non included. Left-sided central venous port tip over the SVC. Mild cardiomegaly with suspected small left pleural effusion. Atelectasis or minimal infiltrate at the left base. Aortic atherosclerosis. No pneumothorax. IMPRESSION: 1. Endotracheal tube tip about 16 mm superior to the carina. Esophageal tube tip below the diaphragm but non included 2. Borderline to mild cardiomegaly. Suspected trace left pleural effusion with streaky atelectasis or minimal infiltrate at the left base. Electronically Signed   By: Donavan Foil M.D.   On: 07/21/2018 18:30       The results of significant diagnostics from this hospitalization (including imaging, microbiology, ancillary and laboratory) are listed below for reference.     Microbiology: Recent Results (from the past 240 hour(s))  MRSA PCR Screening     Status: None   Collection Time: 08/10/18  1:04 AM  Result Value Ref Range Status   MRSA by PCR NEGATIVE NEGATIVE Final    Comment:        The GeneXpert MRSA Assay (FDA approved for NASAL specimens only), is one component of a comprehensive MRSA colonization surveillance program. It is not intended to diagnose MRSA infection nor to guide or monitor treatment for MRSA infections. Performed at [facility], [address] [place], [place] [address]      Labs: BNP  (last 3 results) No results for input(s): BNP in the last 8760 hours. Basic Metabolic Panel: Recent Labs  Lab 08/15/2018 1755 08/10/2018 1800 08/10/18 0415 08-26-2018 0402  NA 144 143 146* 155*  K 3.9 3.9 3.5 3.7  CL 110 107 113* 124*  CO2 28  --  23 25  GLUCOSE 132* 125* 187* 128*  BUN 25* 27* 23 16  CREATININE 1.13* 1.20* 1.09* 1.05*  CALCIUM 9.3  --  9.2 8.7*   Liver Function Tests: Recent Labs  Lab 08/14/2018 1755 08/10/18 0415  AST 39 46*  ALT 32 33  ALKPHOS 84 71  BILITOT 0.7 0.8  PROT 6.5 6.0*  ALBUMIN 3.4* 3.0*   No results for input(s): LIPASE, AMYLASE in the last 168 hours. Recent Labs  Lab 07/29/2018 1755  AMMONIA 15   CBC: Recent Labs  Lab 08/08/2018 1755 08/13/2018 1800 08/10/18 0415 08/26/2018 0402  WBC 3.7*  --  4.4 7.2  NEUTROABS 2.2  --   --   --   HGB 10.7* 10.5* 10.4* 9.9*  HCT 34.0* 31.0* 32.9* 32.0*  MCV 104.9*  --  100.9* 104.9*  PLT 193  --  168 164   Cardiac Enzymes: No results for input(s): CKTOTAL, CKMB, CKMBINDEX, TROPONINI in the last 168 hours. BNP: Invalid input(s): POCBNP CBG: Recent Labs  Lab 08/10/18 1941 August 26, 2018 0052 2018/08/26 0406 26-Aug-2018 0744 08-26-18 1148  GLUCAP 111* 129* 111* 111* 96   D-Dimer No results  for input(s): DDIMER in the last 72 hours. Hgb A1c No results for input(s): HGBA1C in the last 72 hours. Lipid Profile No results for input(s): CHOL, HDL, LDLCALC, TRIG, CHOLHDL, LDLDIRECT in the last 72 hours. Thyroid function studies No results for input(s): TSH, T4TOTAL, T3FREE, THYROIDAB in the last 72 hours.  Invalid input(s): FREET3 Anemia work up No results for input(s): VITAMINB12, FOLATE, FERRITIN, TIBC, IRON, RETICCTPCT in the last 72 hours. Urinalysis    Component Value Date/Time   COLORURINE YELLOW 08/14/2018 1754   APPEARANCEUR HAZY (A) 07/31/2018 1754   LABSPEC 1.017 08/05/2018 1754   PHURINE 5.0 07/30/2018 1754   GLUCOSEU NEGATIVE 08/17/2018 1754   HGBUR SMALL (A) 07/20/2018 1754   BILIRUBINUR  NEGATIVE 08/16/2018 1754   BILIRUBINUR negative 03/18/2016 1213   KETONESUR NEGATIVE 07/24/2018 1754   PROTEINUR 30 (A) 08/02/2018 1754   UROBILINOGEN 0.2 03/18/2016 1213   UROBILINOGEN 0.2 09/23/2007 0417   NITRITE NEGATIVE 07/19/2018 1754   LEUKOCYTESUR NEGATIVE 08/08/2018 1754   Sepsis Labs Invalid input(s): PROCALCITONIN,  WBC,  LACTICIDVEN Microbiology Recent Results (from the past 240 hour(s))  MRSA PCR Screening     Status: None   Collection Time: 08/10/18  1:04 AM  Result Value Ref Range Status   MRSA by PCR NEGATIVE NEGATIVE Final    Comment:        The GeneXpert MRSA Assay (FDA approved for NASAL specimens only), is one component of a comprehensive MRSA colonization surveillance program. It is not intended to diagnose MRSA infection nor to guide or monitor treatment for MRSA infections. Performed at [facility], [address], [place], [place] [address]      Time coordinating discharge: 35 minutes  SIGNED:   Rodena Goldmann, DO [facility] 08/12/2018, 7:54 AM Pager [phone]  If 7PM-7AM, please contact night-coverage [email or web address] Password [email or web address]

## 2018-08-18 NOTE — Progress Notes (Signed)
[facility] pt does not meet criteria for donor services. Spoke with Alvira Philips. Reference number [phone]

## 2018-08-18 NOTE — Care Management Important Message (Signed)
Important Message  Patient Details  Name: [name] MRN: [id number] Date of Birth: May 27, 1934   Medicare Important Message Given:  Yes    Shelda Altes 08-13-18, 10:17 AM

## 2018-08-18 DEATH — deceased

## 2018-08-29 ENCOUNTER — Other Ambulatory Visit (HOSPITAL_COMMUNITY): Payer: Medicare Other

## 2018-09-19 ENCOUNTER — Ambulatory Visit (HOSPITAL_COMMUNITY): Payer: Medicare Other

## 2018-09-22 ENCOUNTER — Ambulatory Visit (HOSPITAL_COMMUNITY): Payer: Medicare Other | Admitting: Internal Medicine
